# Patient Record
Sex: Female | Born: 1960 | Race: Black or African American | Hispanic: No | Smoking: Never smoker
Health system: Southern US, Community
[De-identification: ages and names within clinical notes are randomized; demographics above are authoritative.]

## PROBLEM LIST (undated history)

## (undated) ENCOUNTER — Inpatient Hospital Stay (HOSPITAL_COMMUNITY): Payer: BC Managed Care – PPO

## (undated) DIAGNOSIS — J329 Chronic sinusitis, unspecified: Secondary | ICD-10-CM

## (undated) DIAGNOSIS — G709 Myoneural disorder, unspecified: Secondary | ICD-10-CM

## (undated) DIAGNOSIS — M791 Myalgia, unspecified site: Secondary | ICD-10-CM

## (undated) DIAGNOSIS — G4700 Insomnia, unspecified: Secondary | ICD-10-CM

## (undated) DIAGNOSIS — F419 Anxiety disorder, unspecified: Secondary | ICD-10-CM

## (undated) DIAGNOSIS — J339 Nasal polyp, unspecified: Secondary | ICD-10-CM

## (undated) DIAGNOSIS — E559 Vitamin D deficiency, unspecified: Secondary | ICD-10-CM

## (undated) DIAGNOSIS — I10 Essential (primary) hypertension: Secondary | ICD-10-CM

## (undated) DIAGNOSIS — M797 Fibromyalgia: Secondary | ICD-10-CM

## (undated) DIAGNOSIS — R002 Palpitations: Secondary | ICD-10-CM

## (undated) DIAGNOSIS — E669 Obesity, unspecified: Secondary | ICD-10-CM

## (undated) DIAGNOSIS — M5030 Other cervical disc degeneration, unspecified cervical region: Secondary | ICD-10-CM

## (undated) DIAGNOSIS — F329 Major depressive disorder, single episode, unspecified: Secondary | ICD-10-CM

## (undated) DIAGNOSIS — E785 Hyperlipidemia, unspecified: Secondary | ICD-10-CM

## (undated) DIAGNOSIS — M25561 Pain in right knee: Secondary | ICD-10-CM

## (undated) DIAGNOSIS — E7800 Pure hypercholesterolemia, unspecified: Secondary | ICD-10-CM

## (undated) DIAGNOSIS — G8929 Other chronic pain: Secondary | ICD-10-CM

## (undated) DIAGNOSIS — F32A Depression, unspecified: Secondary | ICD-10-CM

## (undated) DIAGNOSIS — K219 Gastro-esophageal reflux disease without esophagitis: Secondary | ICD-10-CM

## (undated) HISTORY — DX: Myalgia, unspecified site: M79.10

## (undated) HISTORY — DX: Major depressive disorder, single episode, unspecified: F32.9

## (undated) HISTORY — PX: OTHER SURGICAL HISTORY: SHX169

## (undated) HISTORY — DX: Other chronic pain: G89.29

## (undated) HISTORY — DX: Palpitations: R00.2

## (undated) HISTORY — DX: Depression, unspecified: F32.A

## (undated) HISTORY — DX: Vitamin D deficiency, unspecified: E55.9

## (undated) HISTORY — DX: Pain in right knee: M25.561

## (undated) HISTORY — DX: Anxiety disorder, unspecified: F41.9

## (undated) HISTORY — PX: BREAST REDUCTION SURGERY: SHX8

## (undated) HISTORY — PX: OOPHORECTOMY: SHX86

---

## 1998-12-22 HISTORY — PX: REDUCTION MAMMAPLASTY: SUR839

## 2005-11-23 ENCOUNTER — Emergency Department: Payer: Self-pay | Admitting: Internal Medicine

## 2007-11-10 ENCOUNTER — Ambulatory Visit: Payer: Self-pay

## 2007-11-10 IMAGING — MG MAM DGTL SCREENING MAMMO W/CAD
1 series · 4 of 4 positions shown · non-contrast
Comparison: none

REASON FOR EXAM: Screening mammogram
COMMENTS:

[Series 2810: R CC · right · 4 of 6 slices shown]
[im 1/6]
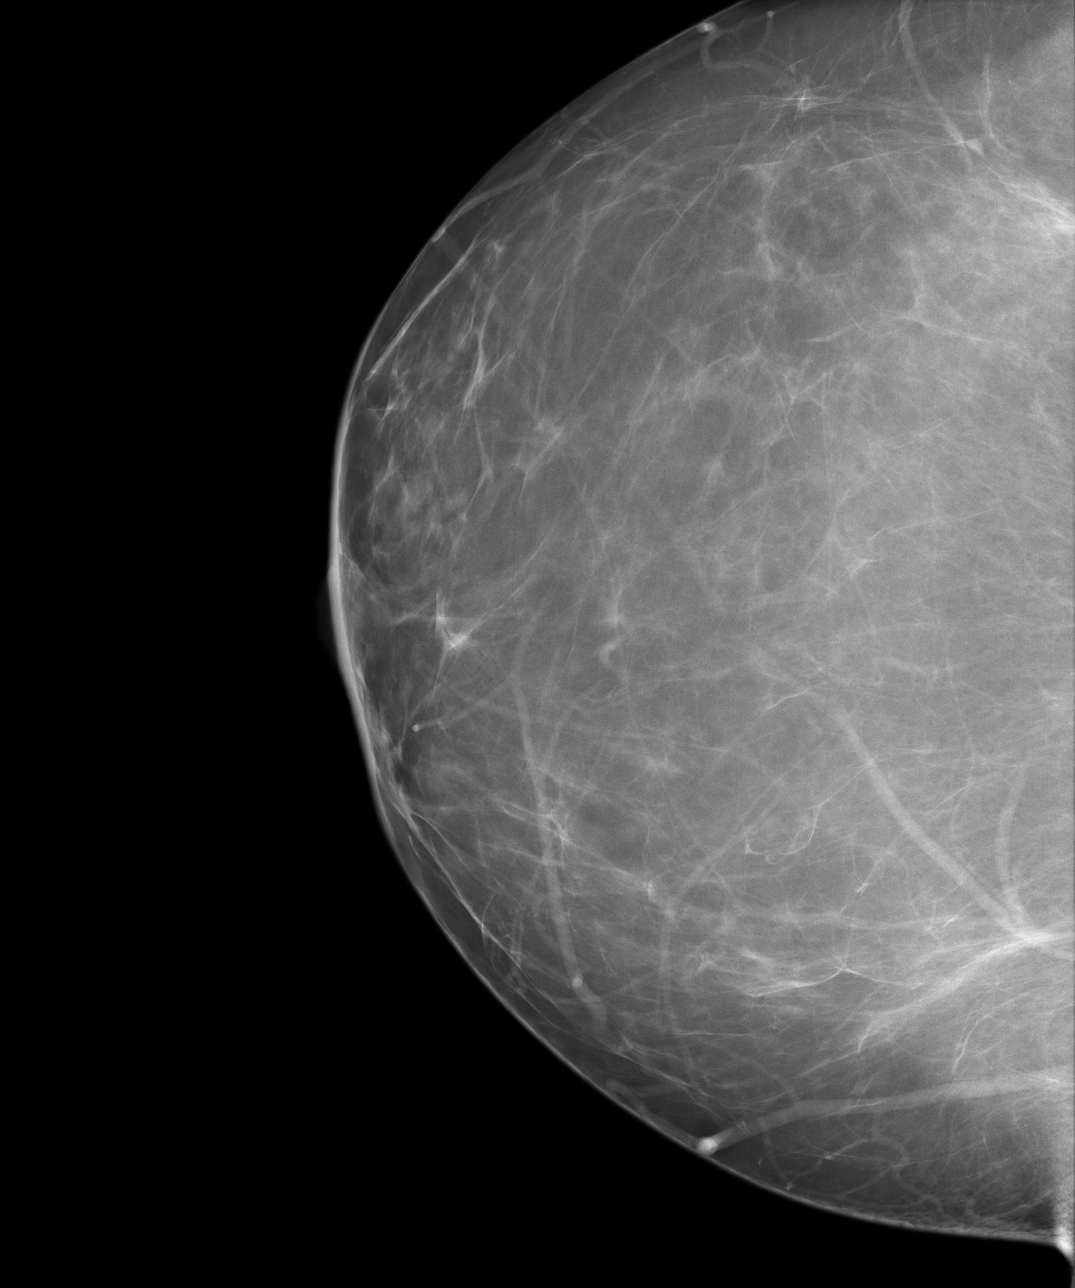
[im 2/6]
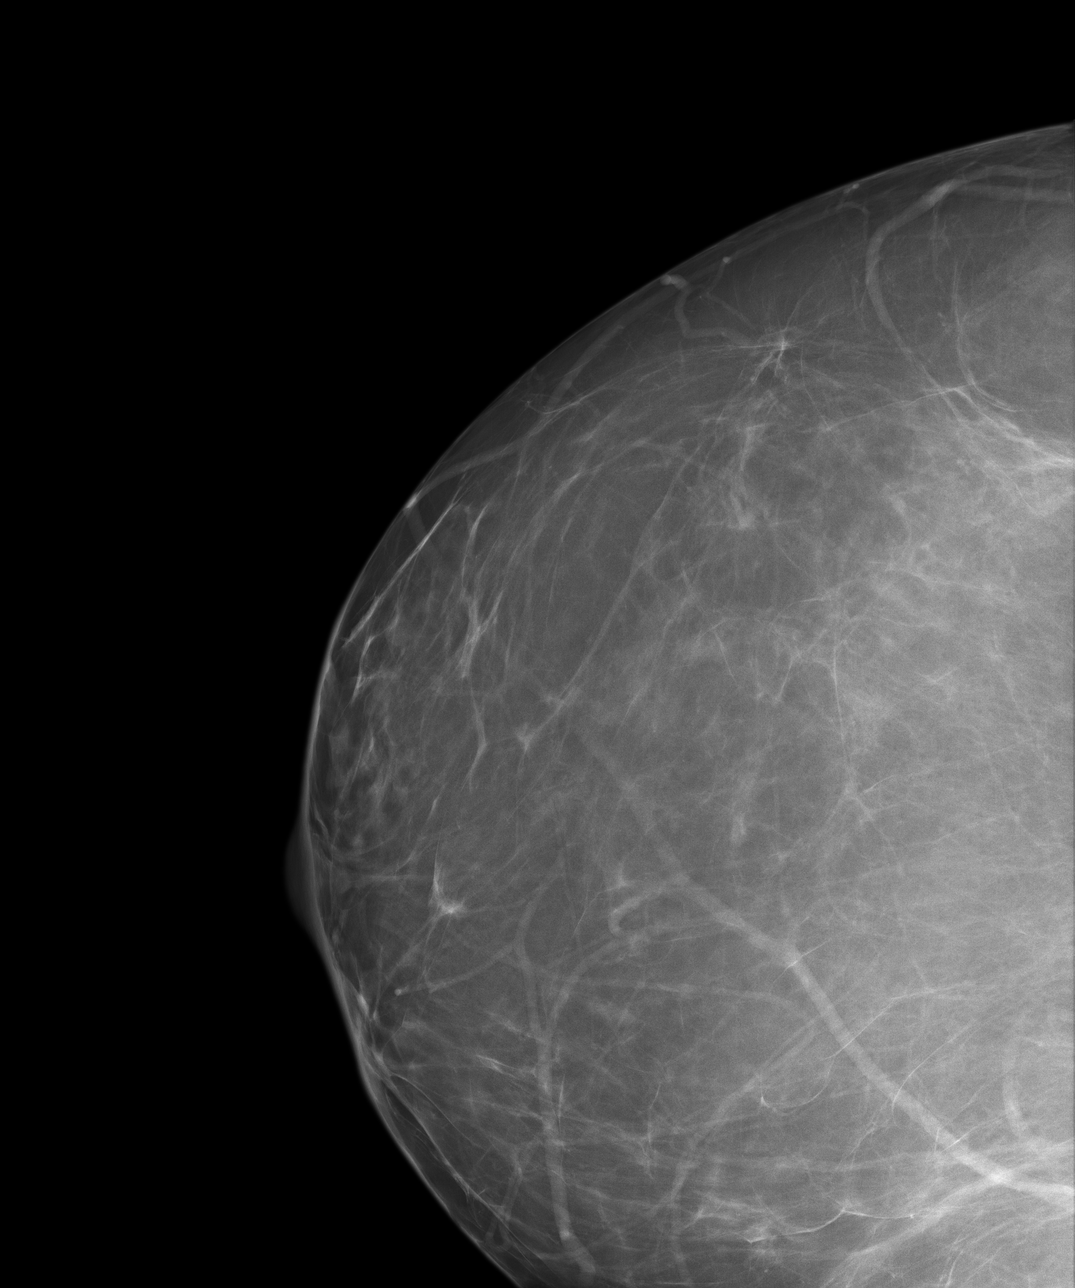
[im 4/6]
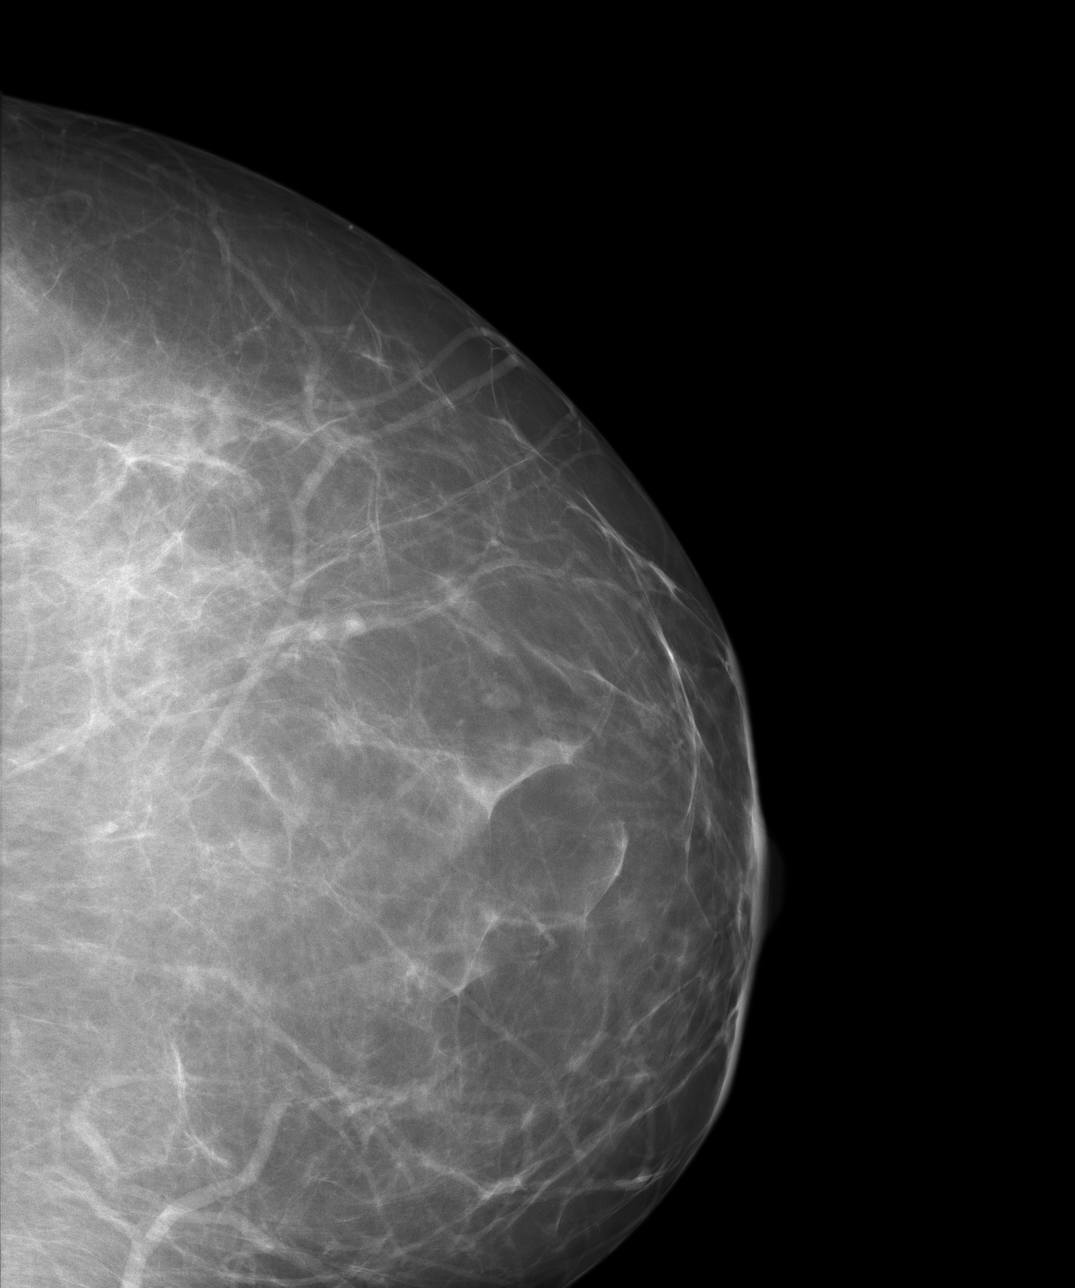
[im 6/6]
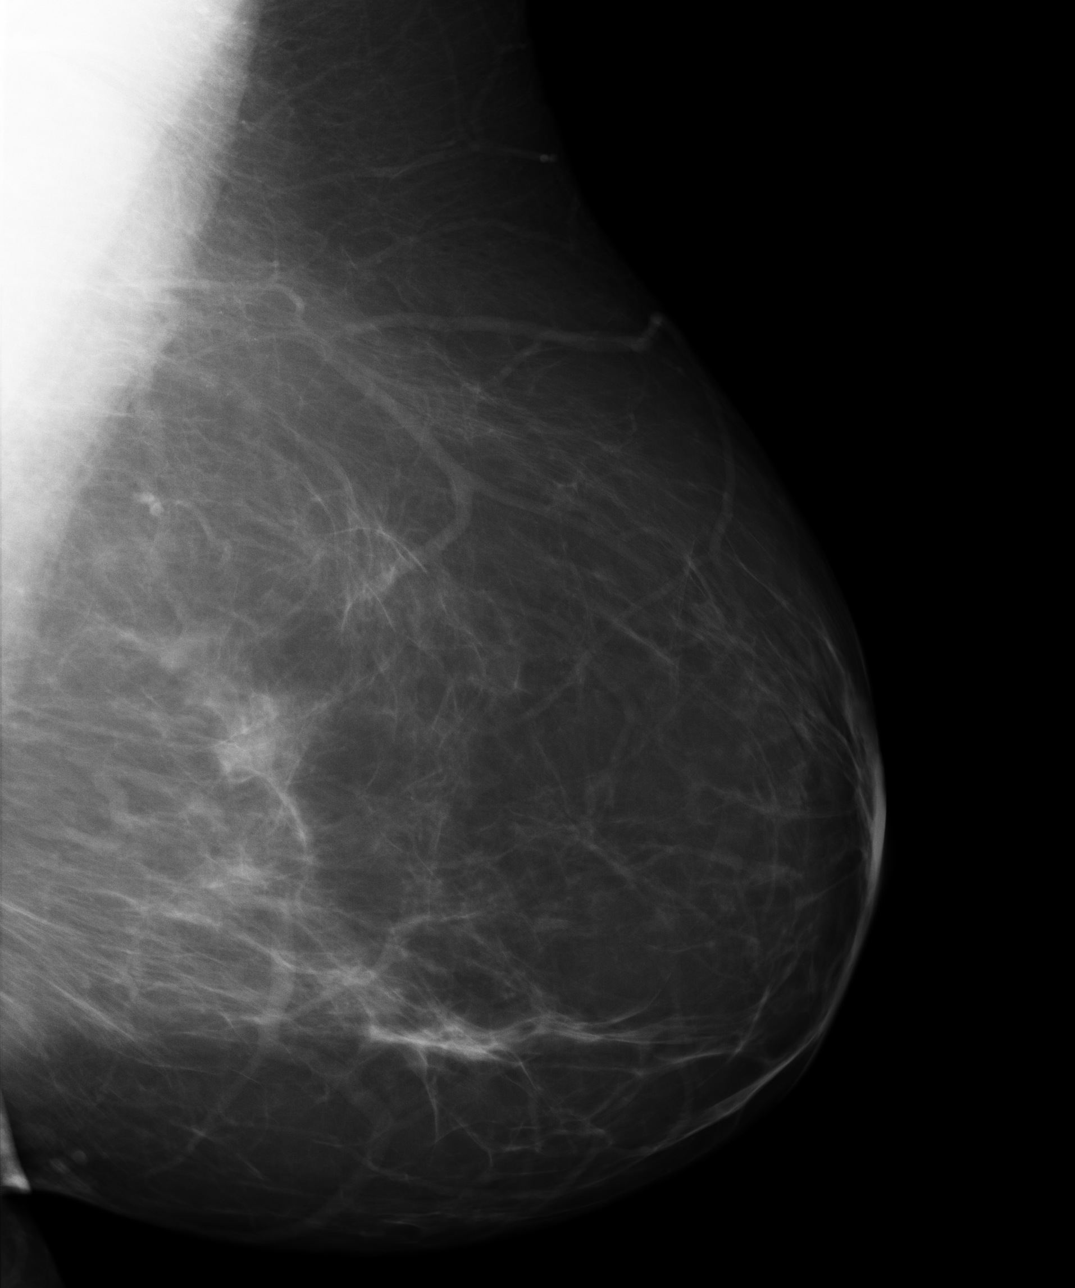

[4 of 4 positions shown; findings below may reference images not displayed]

PROCEDURE:     MAM - MAM DGTL SCREENING MAMMO W/CAD  - [DATE]  [DATE]

RESULT:     The breasts demonstrate a heterogeneous parenchymal pattern. An
area of asymmetric, spiculated density projects in the central portion of
the LEFT breast, appreciated primarily on the MLO view. Further evaluation
with magnification compression imaging is recommended. No further
radiographic evidence to suggest malignancy is appreciated.
IMPRESSION: BI-RADS: Category 0 - Needs Additional Imaging Evaluation

Thank you for this opportunity to contribute to the care of your patient.

A NEGATIVE MAMMOGRAM REPORT DOES NOT PRECLUDE BIOPSY OR OTHER EVALUATION OF
A CLINICALLY PALPABLE OR OTHERWISE SUSPICIOUS MASS OR LESION. BREAST CANCER
MAY NOT BE DETECTED BY MAMMOGRAPHY IN UP TO 10% OF CASES.

## 2007-11-24 ENCOUNTER — Ambulatory Visit: Payer: Self-pay

## 2008-01-11 ENCOUNTER — Emergency Department: Payer: Self-pay | Admitting: Emergency Medicine

## 2008-01-11 IMAGING — CT CT HEAD WITHOUT CONTRAST
2 series · 15 of 30 positions shown, 19 images · non-contrast
Comparison: none

REASON FOR EXAM: left temporal headache
COMMENTS:

PROCEDURE:     CT  - CT HEAD WITHOUT CONTRAST  - [DATE]  [DATE]
RESULT:     Comparison: Report of head CT on [DATE]. Images are not
currently available for direct comparison.
Procedure: CT examination of the head was performed without intravenous
contrast. Collimation is 5 mm.

[Series 2: without · axial · non-contrast · 0.43mm/px · z∈[+442,+562]mm · 13 of 30 slices shown, 17 images]
[im 3/30  brain]
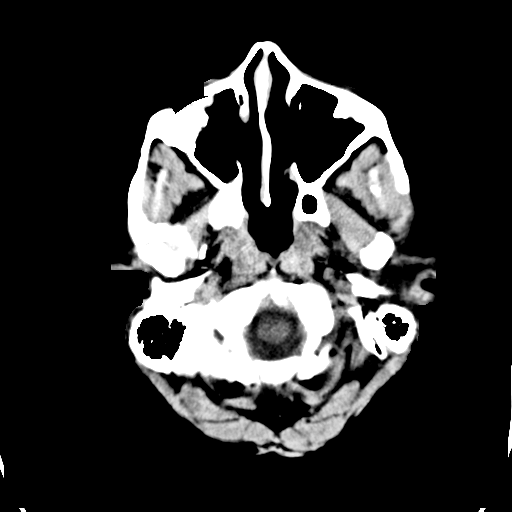
[im 3/30  bone]
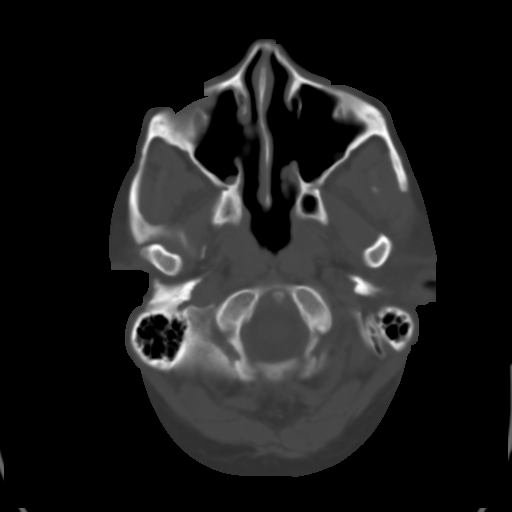
[im 5/30  brain]
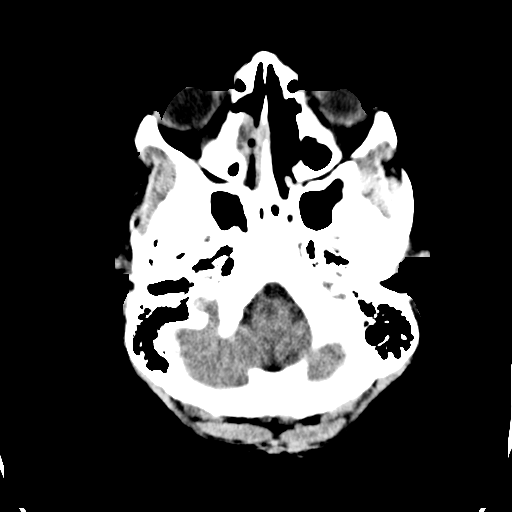
[im 7/30  brain]
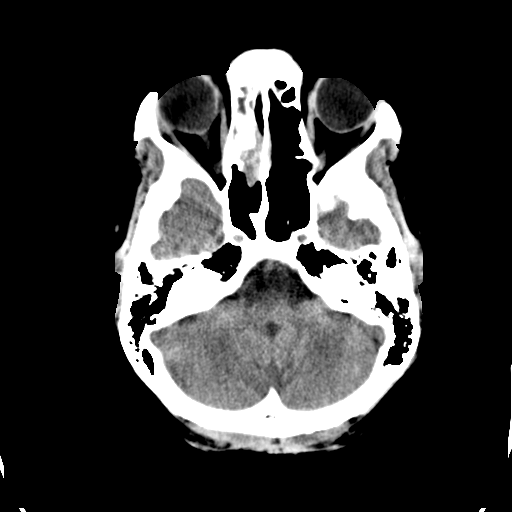
[im 9/30  brain]
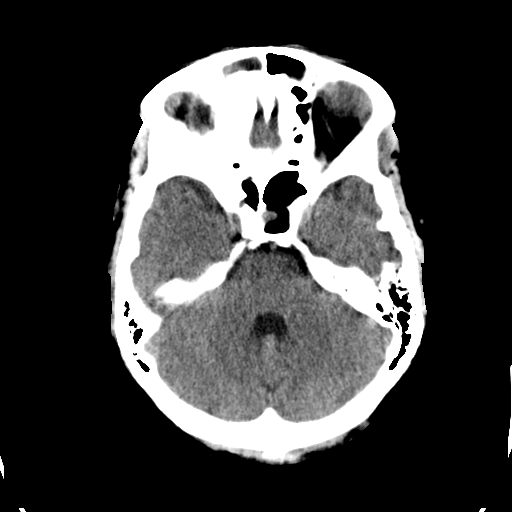
[im 11/30  brain]
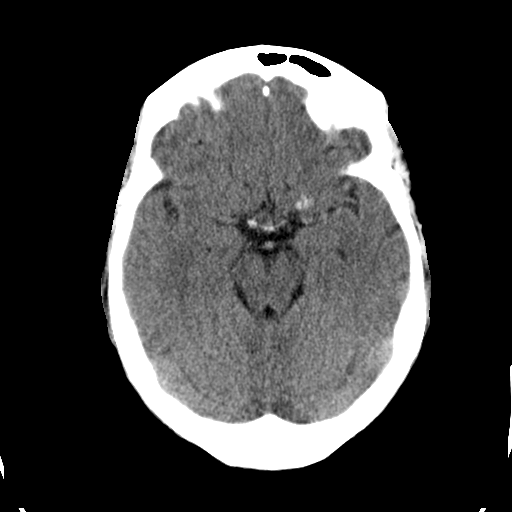
[im 11/30  bone]
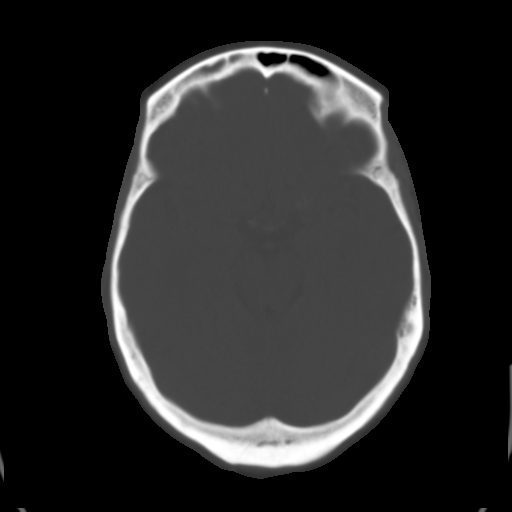
[im 13/30  brain]
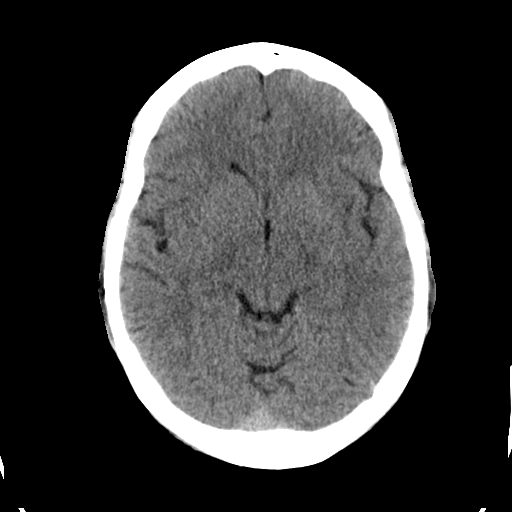
[im 15/30  brain]
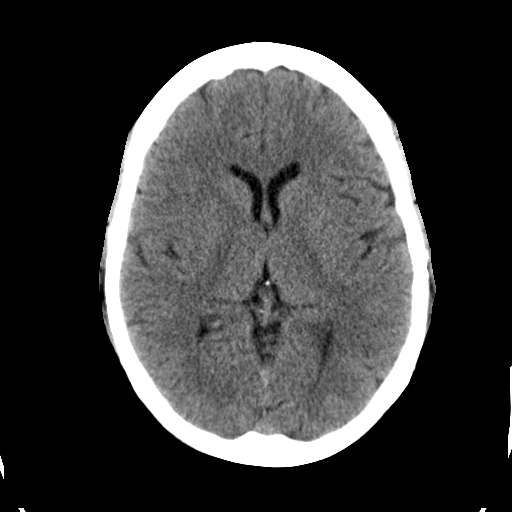
[im 17/30  brain]
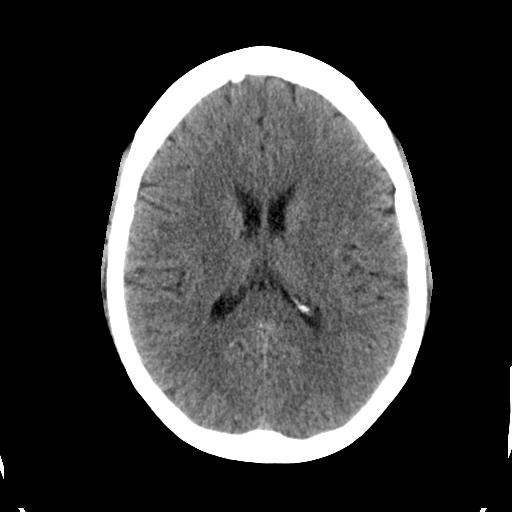
[im 19/30  brain]
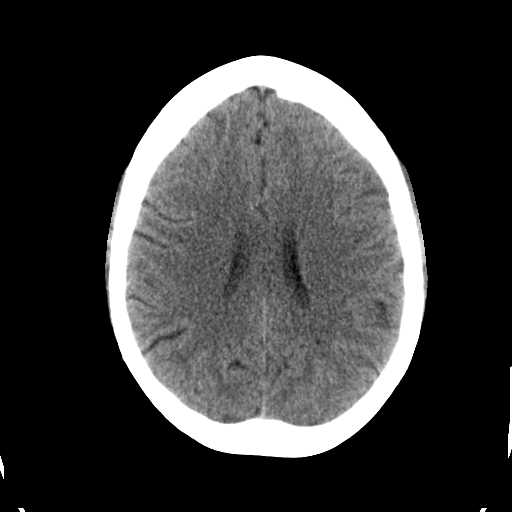
[im 19/30  bone]
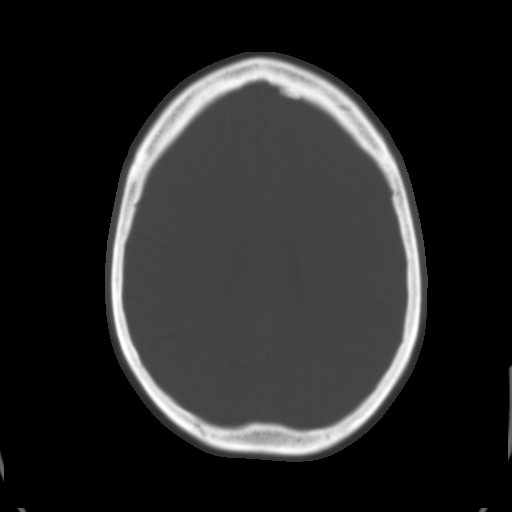
[im 21/30  brain]
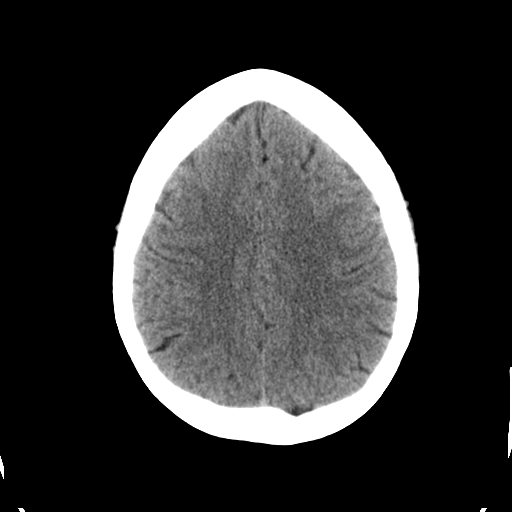
[im 23/30  brain]
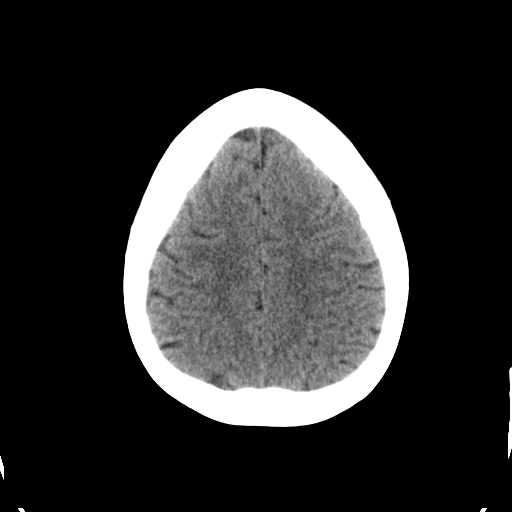
[im 25/30  brain]
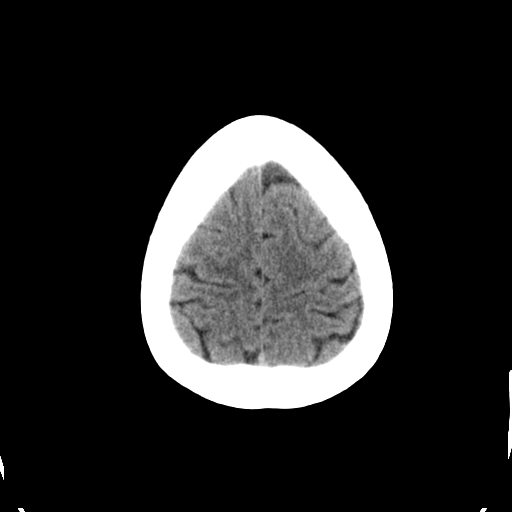
[im 27/30  brain]
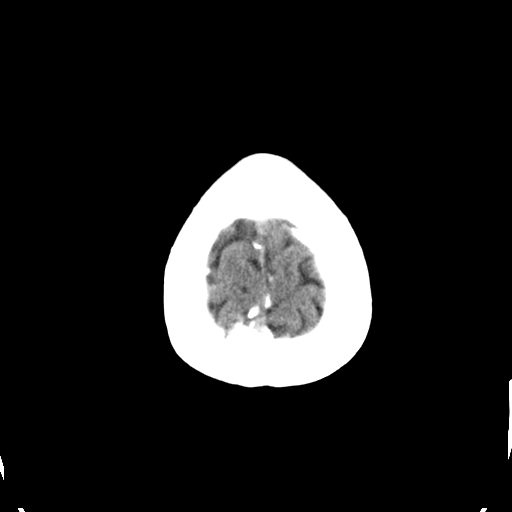
[im 27/30  bone]
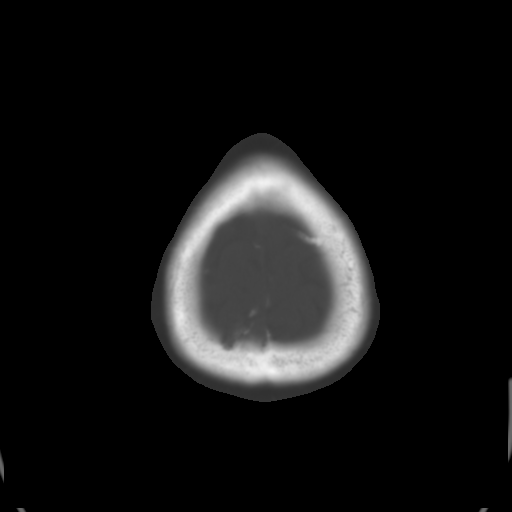

[Series 3: bone · axial · 0.43mm/px · z∈[+442,+462]mm · 2 of 30 slices shown]
[im 3/30  bone]
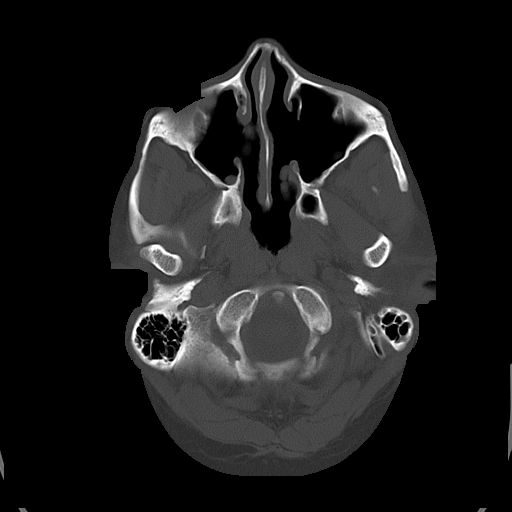
[im 7/30  bone]
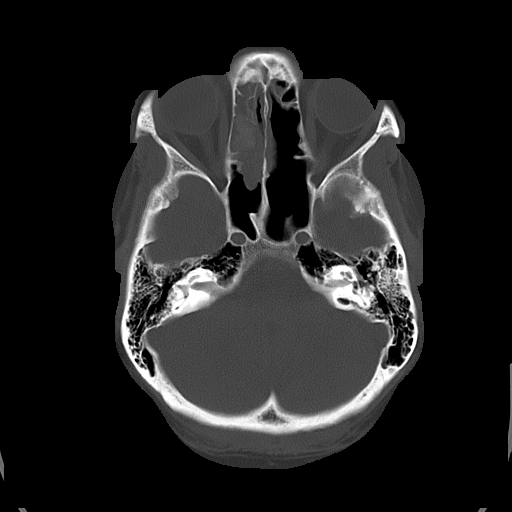

[15 of 30 positions shown; findings below may reference images not displayed]

FINDINGS: There is a 7 mm left frontal extra-axial calcification which can be related
to a calcified meningioma. No evidence of intracranial hemorrhage,
mass-effect, or ventricular dilatation. The gray and white matters are
differentiated. No displaced calvarial fracture is noted. There is total
opacification of the right frontal sinus as well as moderate partial
opacification of the right ethmoid sinus. Changes of bilateral
maxillectomies are noted.
IMPRESSION: 1. There is a 7 mm left frontal extra-axial calcification which can be
related to a calcified meningioma. This is similar to report of head CT on
[DATE].

[DATE]. There is total opacification of the right frontal sinus as well as
moderate partial opacification of the right ethmoid sinus. Correlate
clinically for any signs and symptoms of sinusitis. Changes of bilateral
maxillectomies are noted.

Preliminary report was faxed to the emergency room by the night radiologist
shortly after the study was performed.

## 2008-09-12 ENCOUNTER — Emergency Department: Payer: Self-pay | Admitting: Emergency Medicine

## 2009-08-03 ENCOUNTER — Emergency Department: Payer: Self-pay | Admitting: Emergency Medicine

## 2010-04-03 ENCOUNTER — Ambulatory Visit: Payer: Self-pay | Admitting: Anesthesiology

## 2010-12-22 ENCOUNTER — Emergency Department: Payer: Self-pay | Admitting: Emergency Medicine

## 2011-01-02 ENCOUNTER — Emergency Department: Payer: Self-pay | Admitting: Emergency Medicine

## 2011-07-09 ENCOUNTER — Ambulatory Visit: Payer: Self-pay | Admitting: Family Medicine

## 2011-07-09 IMAGING — US ABDOMEN ULTRASOUND LIMITED
1 series · 17 of 25 positions shown · non-contrast
Comparison: none

REASON FOR EXAM: STAT CR [id number] [L1] ext 233 RUQ pain eval gb
COMMENTS:

PROCEDURE:     US  - US ABDOMEN LIMITED SURVEY  - [DATE] [DATE]
RESULT:     Comparison: None.
TECHNIQUE: Multiple grayscale and color Doppler images obtained of the right
upper quadrant.

[Series 1: abdomen ultrasound limited · 17 of 59 slices shown]
[im 1/59]
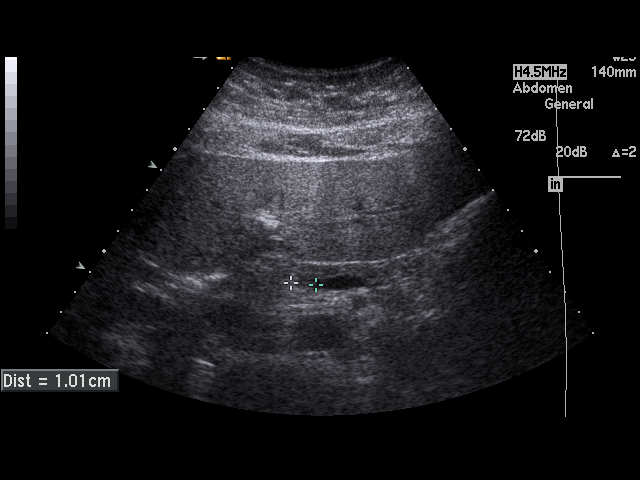
[im 5/59]
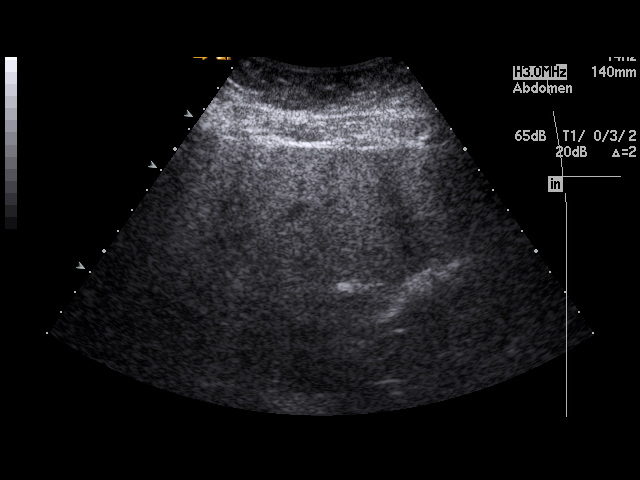
[im 8/59]
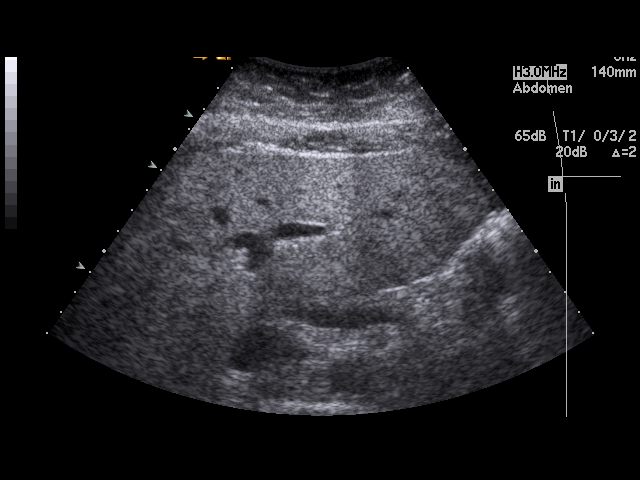
[im 13/59]
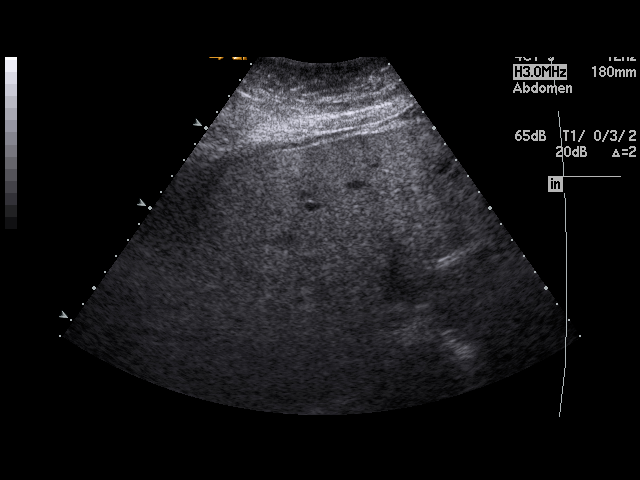
[im 15/59]
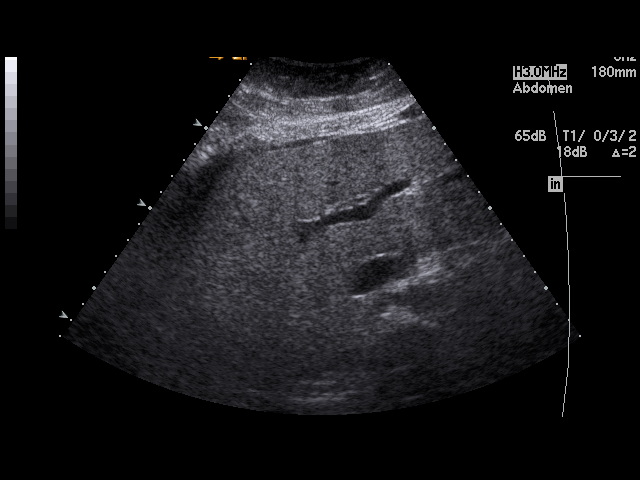
[im 20/59]
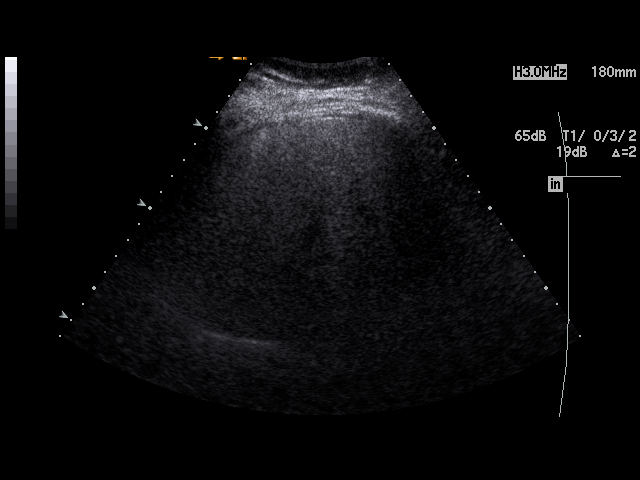
[im 22/59]
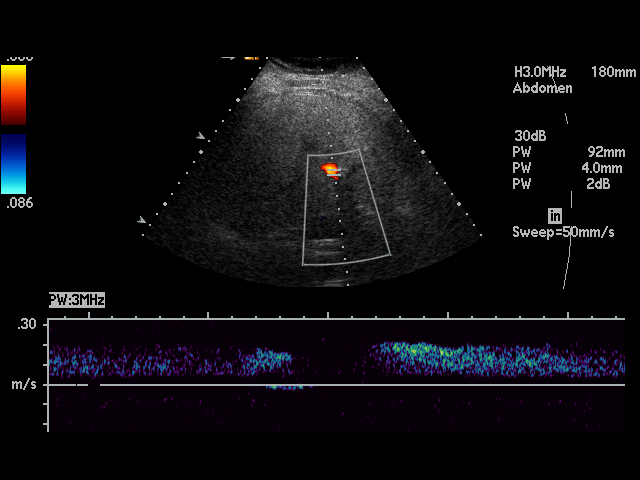
[im 27/59]
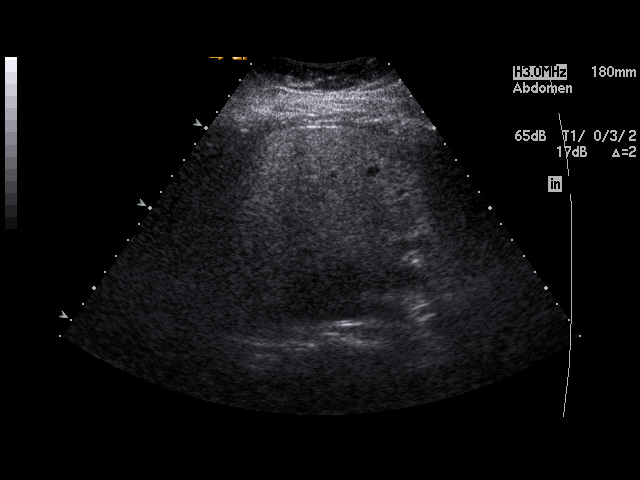
[im 30/59]
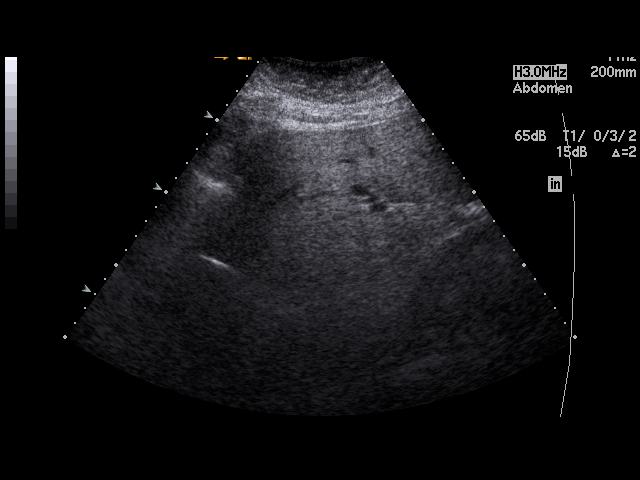
[im 32/59]
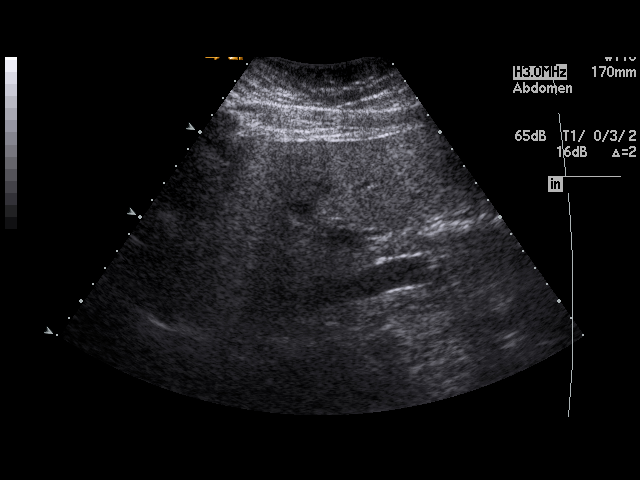
[im 37/59]
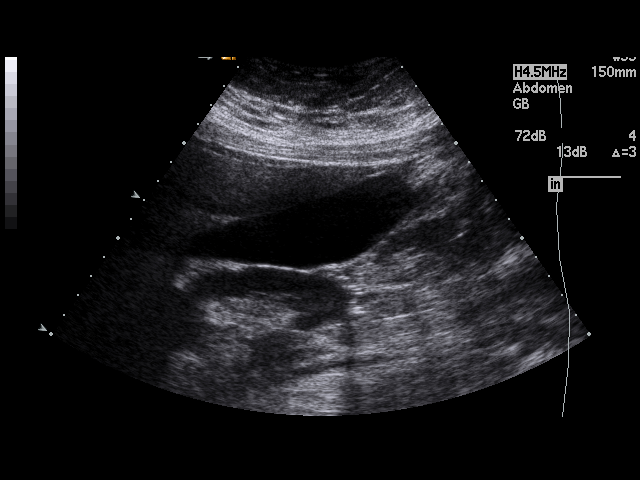
[im 39/59]
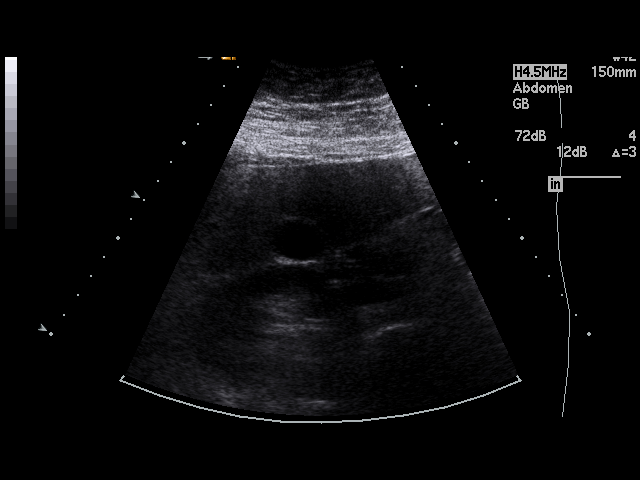
[im 44/59]
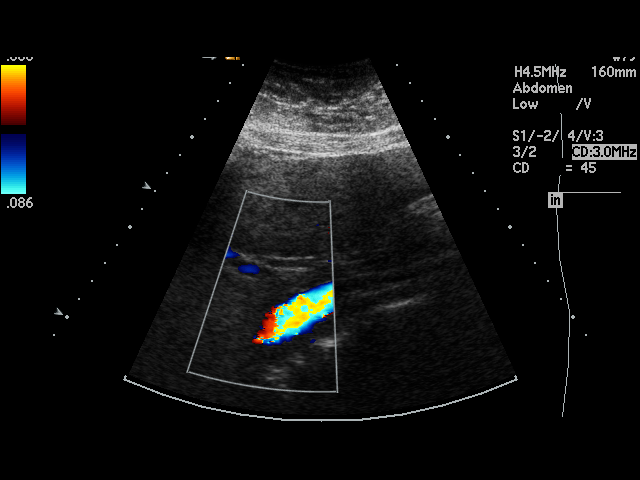
[im 46/59]
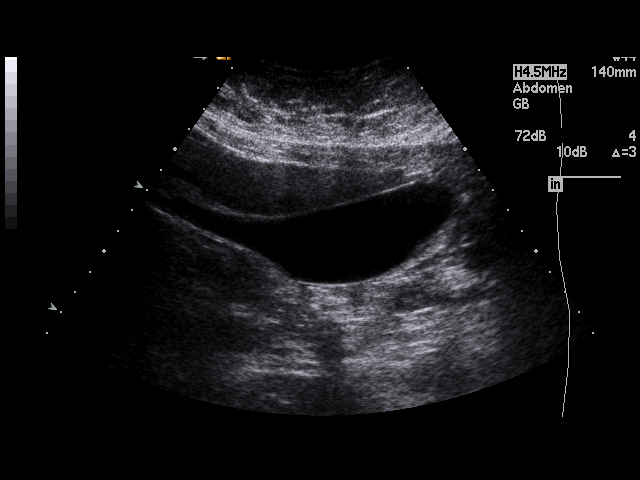
[im 51/59]
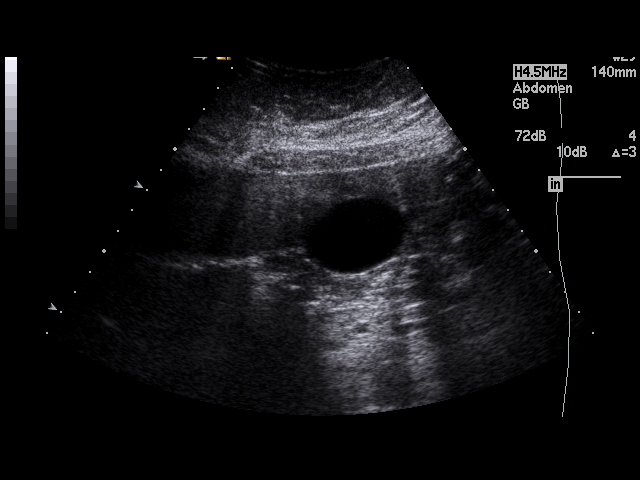
[im 54/59]
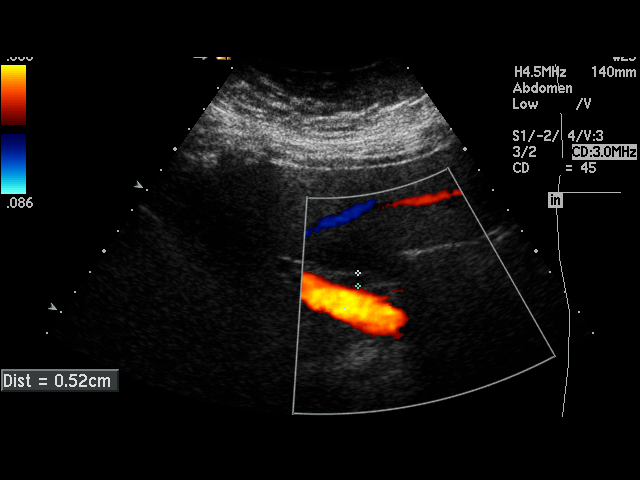
[im 59/59]
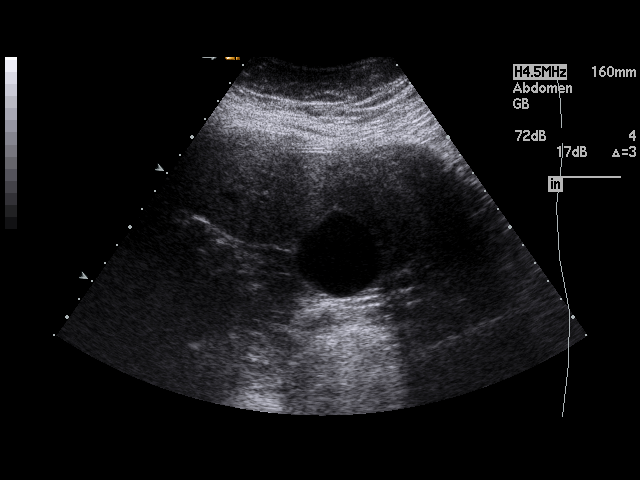

[17 of 25 positions shown; findings below may reference images not displayed]

FINDINGS: The liver is diffusely echogenic and dense, consistent hepatic steatosis.
The pancreas was mostly obscured by overlying bowel gas. The gallbladder is
normal. Sonographic sign was negative. The common bile duct measures 5 mm.
IMPRESSION: 1. Normal gallbladder.
2. Hepatic steatosis.
3. Limited visualization of the pancreas.

## 2011-12-13 ENCOUNTER — Emergency Department (HOSPITAL_COMMUNITY): Payer: BC Managed Care – PPO

## 2011-12-13 ENCOUNTER — Encounter: Payer: Self-pay | Admitting: *Deleted

## 2011-12-13 ENCOUNTER — Other Ambulatory Visit: Payer: Self-pay

## 2011-12-13 ENCOUNTER — Emergency Department (HOSPITAL_COMMUNITY)
Admission: EM | Admit: 2011-12-13 | Discharge: 2011-12-13 | Disposition: A | Discharge status: Home / Self Care | Payer: BC Managed Care – PPO | Attending: Emergency Medicine | Admitting: Emergency Medicine

## 2011-12-13 DIAGNOSIS — R002 Palpitations: Secondary | ICD-10-CM

## 2011-12-13 DIAGNOSIS — R000 Tachycardia, unspecified: Secondary | ICD-10-CM | POA: Insufficient documentation

## 2011-12-13 DIAGNOSIS — Z79899 Other long term (current) drug therapy: Secondary | ICD-10-CM | POA: Insufficient documentation

## 2011-12-13 DIAGNOSIS — R0989 Other specified symptoms and signs involving the circulatory and respiratory systems: Secondary | ICD-10-CM | POA: Insufficient documentation

## 2011-12-13 DIAGNOSIS — R0609 Other forms of dyspnea: Secondary | ICD-10-CM | POA: Insufficient documentation

## 2011-12-13 DIAGNOSIS — R0602 Shortness of breath: Secondary | ICD-10-CM | POA: Insufficient documentation

## 2011-12-13 HISTORY — DX: Essential (primary) hypertension: I10

## 2011-12-13 HISTORY — DX: Pure hypercholesterolemia, unspecified: E78.00

## 2011-12-13 LAB — BASIC METABOLIC PANEL WITH GFR
BUN: 17 mg/dL (ref 6–23)
CO2: 26 meq/L (ref 19–32)
Calcium: 9.4 mg/dL (ref 8.4–10.5)
Chloride: 102 meq/L (ref 96–112)
Creatinine, Ser: 0.7 mg/dL (ref 0.50–1.10)
GFR calc Af Amer: >90 mL/min (ref >90)
GFR calc non Af Amer: >90 mL/min (ref >90)
Glucose, Bld: 151 mg/dL — ABNORMAL HIGH (ref 70–99)
Potassium: 3.8 meq/L (ref 3.5–5.1)
Sodium: 140 meq/L (ref 135–145)

## 2011-12-13 LAB — CBC
HCT: 41.3 % (ref 36.0–46.0)
Hemoglobin: 13.5 g/dL (ref 12.0–15.0)
MCH: 26.7 pg (ref 26.0–34.0)
MCHC: 32.7 g/dL (ref 30.0–36.0)
MCV: 81.8 fL (ref 78.0–100.0)
Platelets: 233 K/uL (ref 150–400)
RBC: 5.05 MIL/uL (ref 3.87–5.11)
RDW: 14 % (ref 11.5–15.5)
WBC: 9.4 K/uL (ref 4.0–10.5)

## 2011-12-13 LAB — CARDIAC PANEL(CRET KIN+CKTOT+MB+TROPI)
Relative Index: 0.9 (ref 0.0–2.5)
Troponin I: <0.3 ng/mL (ref <0.30)

## 2011-12-13 NOTE — ED Notes (Signed)
Pt c/o intermittent episodes of "rapid heart rate" assoc. W/sob and mid-sternum chest discomfort "for a while" w/increase concern last night, pt denies back/abd pain, N/V, and diaphoresis. NAD, will continue to monitor

## 2011-12-13 NOTE — ED Notes (Signed)
Gave new ECG to Dr. Jeraldine Loots after I performed. 8:21 am JG

## 2011-12-13 NOTE — ED Notes (Signed)
Pt c/o "heart racing" associated w/some sob starting last night

## 2011-12-13 NOTE — ED Provider Notes (Signed)
History     CSN: [id number]  Arrival date & time 12/13/11  4098   First MD Initiated Contact with Patient 12/13/11 0805      Chief Complaint  Patient presents with  . Tachycardia  . Shortness of Breath    (Consider location/radiation/quality/duration/timing/severity/associated sxs/prior treatment) HPI The patient presents following an episode of palpitations and mild dyspnea last night. She notes her symptoms began insidiously approximately 12 hours ago. Although she was symptomatic, she did go out socially. She notes that she was otherwise well, was able to act normally, but her symptoms persisted until going to sleep, approximately 8 hours ago. She woke this morning asymptomatic, but presents with concerns over what may have occurred yesterday. She notes that she has been taking melatonin, Benadryl, Z-Quill.  She notes no prior illness, no other new medications, no smoking, no recent travel, no history of cardiac disease, no history of PE/DVT. She denies significant pain throughout the episode, notes mild discomfort with coughing. No fever, no chills, no syncope, no extremity edema History reviewed. No pertinent past medical history.  History reviewed. No pertinent past surgical history.  No family history on file.  History  Substance Use Topics  . Smoking status: Not on file  . Smokeless tobacco: Not on file  . Alcohol Use: Not on file    OB History    Grav Para Term Preterm Abortions TAB SAB Ect Mult Living                  Review of Systems  Constitutional:       HPI  HENT:       HPI otherwise negative  Eyes: Negative.   Respiratory:       HPI, otherwise negative  Cardiovascular:       HPI, otherwise nmegative  Gastrointestinal: Negative for vomiting.  Genitourinary:       HPI, otherwise negative  Musculoskeletal:       HPI, otherwise negative  Skin: Negative.   Neurological: Negative for syncope.    Allergies  Compazine  Home Medications   Current  Outpatient Rx  Name Route Sig Dispense Refill  . VITAMIN D 1000 UNITS PO TABS Oral Take 1,000 Units by mouth 2 (two) times daily.      Marland Kitchen DIPHENHYDRAMINE HCL 25 MG PO TABS Oral Take 50 mg by mouth every 6 (six) hours as needed. For sleeplessness and anxiety     . GLIMEPIRIDE 4 MG PO TABS Oral Take 4 mg by mouth 2 (two) times daily.      Marland Kitchen LISINOPRIL 20 MG PO TABS Oral Take 20 mg by mouth daily.      Marland Kitchen MELATONIN 3 MG PO TABS Oral Take 3-4 tablets by mouth at bedtime.      Marland Kitchen METFORMIN HCL 500 MG PO TABS Oral Take 500 mg by mouth 2 (two) times daily with a meal.      . MILK THISTLE 175 MG PO TABS Oral Take 175 mg by mouth daily. For cancer prevention     . ADULT MULTIVITAMIN W/MINERALS CH Oral Take 1 tablet by mouth daily.      Marland Kitchen PRAVASTATIN SODIUM PO Oral Take 1 tablet by mouth daily.      Marland Kitchen ZOLOFT PO Oral Take 0.5 tablets by mouth daily.        BP 148/88  Pulse 82  Temp(Src) 98.5 F (36.9 C) (Oral)  Resp 20  SpO2 98%  Physical Exam  Nursing note and vitals reviewed. Constitutional: She  is oriented to person, place, and time. She appears well-developed and well-nourished. No distress.  HENT:  Head: Normocephalic and atraumatic.  Eyes: Conjunctivae and EOM are normal.  Cardiovascular: Normal rate and regular rhythm.   Pulmonary/Chest: Effort normal and breath sounds normal. No stridor. No respiratory distress.  Abdominal: She exhibits no distension.  Musculoskeletal: She exhibits no edema.  Neurological: She is alert and oriented to person, place, and time. No cranial nerve deficit.  Skin: Skin is warm and dry.  Psychiatric: She has a normal mood and affect.    ED Course  Procedures (including critical care time)   Labs Reviewed  BASIC METABOLIC PANEL  CBC  CARDIAC PANEL(CRET KIN+CKTOT+MB+TROPI)   No results found.   No diagnosis found.  Pulse ox 99% room air, normal Cardiac monitor 79 sinus rhythm him a normal   Date: 12/13/2011  Rate: 82  Rhythm: normal sinus  rhythm  QRS Axis: normal  Intervals: normal  ST/T Wave abnormalities: normal  Conduction Disutrbances:none  Narrative Interpretation:   Old EKG Reviewed: none available  NORMAL ECG   MDM  This 50 year old female presents one day after an episode of palpitations. On exam the patient is asymptomatic, notes no symptoms for the past 8 hours, minimally. The patient discussion of palpitations is consistent with PAC/PVC, though there is some concern for loculated abnormalities, and minimal concern for ischemic cardiac disease. The patient's benign physical exam, unremarkable vital signs, and a laboratory evaluation that did not demonstrate acute findings beyond an elevated CK are all reassuring. The patient's chest x-ray is essentially normal though it is formally read as possible opacification in the left lower lobe. Given the absence of fever, cough, leukocytosis, hypoxia, tachypnea pneumonia is not clinically consistent. The patient will be discharged with instructions to follow up with her primary care physician, with consideration of outpatient Holter monitoring. She was also provided explicit return precautions.        Gerhard Munch, MD 12/13/11 [phone]

## 2011-12-21 ENCOUNTER — Encounter (HOSPITAL_COMMUNITY): Payer: Self-pay

## 2011-12-21 ENCOUNTER — Emergency Department (HOSPITAL_COMMUNITY)
Admission: EM | Admit: 2011-12-21 | Discharge: 2011-12-21 | Disposition: A | Discharge status: Home / Self Care | Payer: BC Managed Care – PPO | Attending: Emergency Medicine | Admitting: Emergency Medicine

## 2011-12-21 DIAGNOSIS — E119 Type 2 diabetes mellitus without complications: Secondary | ICD-10-CM | POA: Insufficient documentation

## 2011-12-21 DIAGNOSIS — I10 Essential (primary) hypertension: Secondary | ICD-10-CM | POA: Insufficient documentation

## 2011-12-21 DIAGNOSIS — E7800 Pure hypercholesterolemia, unspecified: Secondary | ICD-10-CM | POA: Insufficient documentation

## 2011-12-21 DIAGNOSIS — G479 Sleep disorder, unspecified: Secondary | ICD-10-CM

## 2011-12-21 DIAGNOSIS — R5383 Other fatigue: Secondary | ICD-10-CM

## 2011-12-21 DIAGNOSIS — R5381 Other malaise: Secondary | ICD-10-CM | POA: Insufficient documentation

## 2011-12-21 MED ORDER — ZOLPIDEM TARTRATE 5 MG PO TABS: 5.0000 mg | ORAL_TABLET | Freq: Every evening | ORAL | Status: DC | PRN

## 2011-12-21 NOTE — ED Provider Notes (Signed)
History     CSN: [id number]  Arrival date & time 12/21/11  0908   First MD Initiated Contact with Patient 12/21/11 0912      Chief Complaint  Patient presents with  . Fatigue    (Consider location/radiation/quality/duration/timing/severity/associated sxs/prior treatment) HPI Patient is a 50 yo female who presents today complaining of fatigue and difficulties with sleep.  She was here several days ago with evaluation for palpitations.  These have improved and she is following up with a regular doctor.  Patient denies any other symptoms.  She has no chest pain, lightheadedness, or shortness of breath.  She also denies any rectal bleeding or black,tarry stools.  There are no other associated factors. Past Medical History  Diagnosis Date  . Diabetes mellitus   . Hypertension   . High cholesterol     Past Surgical History  Procedure Date  . Breast reduction surgery   . Nasal endoscopic   . Breast surgery   . Removal of ovary     Family History  Problem Relation Age of Onset  . Diabetes Mother   . Cancer Mother     History  Substance Use Topics  . Smoking status: Never Smoker   . Smokeless tobacco: Not on file  . Alcohol Use: No    OB History    Grav Para Term Preterm Abortions TAB SAB Ect Mult Living                  Review of Systems  Constitutional: Positive for fatigue.  HENT: Negative.   Eyes: Negative.   Respiratory: Negative.   Cardiovascular: Negative.   Genitourinary: Negative.   Musculoskeletal: Negative.   Neurological: Negative.   Hematological: Negative.   Psychiatric/Behavioral: Positive for sleep disturbance.    Allergies  Compazine  Home Medications   Current Outpatient Rx  Name Route Sig Dispense Refill  . ASPIRIN EC 325 MG PO TBEC Oral Take 162.5 mg by mouth daily.      Marland Kitchen VITAMIN D 1000 UNITS PO TABS Oral Take 1,000 Units by mouth 2 (two) times daily.      Marland Kitchen DIPHENHYDRAMINE HCL 25 MG PO TABS Oral Take 50 mg by mouth every 6 (six)  hours as needed. For sleeplessness and anxiety     . GLIMEPIRIDE 4 MG PO TABS Oral Take 4 mg by mouth 2 (two) times daily.      Marland Kitchen LISINOPRIL 20 MG PO TABS Oral Take 20 mg by mouth daily.      Marland Kitchen MELATONIN 3 MG PO TABS Oral Take 3-4 tablets by mouth at bedtime.      Marland Kitchen METFORMIN HCL ER 500 MG PO TB24 Oral Take 500 mg by mouth 2 (two) times daily.      Marland Kitchen MILK THISTLE 175 MG PO TABS Oral Take 175 mg by mouth daily. For cancer prevention     . ADULT MULTIVITAMIN W/MINERALS CH Oral Take 1 tablet by mouth daily.      Marland Kitchen PRAVASTATIN SODIUM 20 MG PO TABS Oral Take 20 mg by mouth daily.      . SERTRALINE HCL 50 MG PO TABS Oral Take 25 mg by mouth daily.      Marland Kitchen ZOLPIDEM TARTRATE 5 MG PO TABS Oral Take 1 tablet (5 mg total) by mouth at bedtime as needed for sleep. 10 tablet 0    BP 149/91  Pulse 79  Temp(Src) 97.2 F (36.2 C) (Oral)  Resp 18  SpO2 99%  Physical Exam  Nursing note  and vitals reviewed. Constitutional: She is oriented to person, place, and time. She appears well-developed and well-nourished. No distress.  HENT:  Head: Normocephalic and atraumatic.  Eyes: Conjunctivae and EOM are normal. Pupils are equal, round, and reactive to light.  Neck: Normal range of motion.  Cardiovascular: Normal rate, regular rhythm, normal heart sounds and intact distal pulses.  Exam reveals no gallop and no friction rub.   No murmur heard. Pulmonary/Chest: Effort normal and breath sounds normal. No respiratory distress. She has no wheezes. She has no rales.  Abdominal: Soft. Bowel sounds are normal. She exhibits no distension. There is no tenderness. There is no rebound and no guarding.  Musculoskeletal: Normal range of motion. She exhibits no edema.  Neurological: She is alert and oriented to person, place, and time. No cranial nerve deficit. She exhibits normal muscle tone. Coordination normal.  Skin: Skin is warm and dry. No rash noted.  Psychiatric:       Patient admits she has had trouble with  anxiety and depression and that her sleep disturbance may be related to this.  She has been sleeping a lot during the day and her problems come at night.  She does not have any signs of mania and she denies SI    ED Course  Procedures (including critical care time)  Labs Reviewed - No data to display No results found.   1. Fatigue   2. Sleep difficulties       MDM  Patient had no concerning physical symptoms today and was not suicidal.  We discussed sleep hygiene and that her multiple visits may stem from depression.  She admitted that she has struggled with this in the past.  She actually was prescribed zoloft but only took one pill.  We discussed that this medication takes at least 4 weeks of consistent therapy to work.  I was comfortable prescribing the patient 10 tabs of ambien.  She is going to start her zoloft and required no additional work-up.  She will follow-up with her regular doctor.         Cyndra Numbers, MD 12/21/11 [phone]

## 2011-12-21 NOTE — ED Notes (Signed)
Pt in NAD at this time. Pt thanked staff for good treatment. Pt given instructions on zolpidem prescription. Pt ambulated with a quick steady gait.

## 2011-12-21 NOTE — ED Notes (Signed)
Pt states that she has not been sleeping well and has been feeling generally tired for some time now. Pt unable to give time estimate, will only say "just a while now." Pt states she was here for heart palpitations and a cough last week and is still having cough.

## 2012-01-02 ENCOUNTER — Ambulatory Visit: Payer: BC Managed Care – PPO | Admitting: Cardiovascular Disease

## 2012-01-05 ENCOUNTER — Emergency Department: Payer: Self-pay | Admitting: Emergency Medicine

## 2012-01-05 LAB — COMPREHENSIVE METABOLIC PANEL
Albumin: 3.2 g/dL — ABNORMAL LOW (ref 3.4–5.0)
Anion Gap: 7 (ref 7–16)
BUN: 13 mg/dL (ref 7–18)
Bilirubin,Total: 0.2 mg/dL (ref 0.2–1.0)
Calcium, Total: 8.9 mg/dL (ref 8.5–10.1)
Glucose: 116 mg/dL — ABNORMAL HIGH (ref 65–99)
Osmolality: 290 (ref 275–301)
SGOT(AST): 18 U/L (ref 15–37)
Total Protein: 7.4 g/dL (ref 6.4–8.2)

## 2012-01-05 LAB — CBC WITH DIFFERENTIAL/PLATELET
Eosinophil %: 4.1 %
HGB: 12.2 g/dL (ref 12.0–16.0)
Lymphocyte %: 37.2 %
MCHC: 32.5 g/dL (ref 32.0–36.0)
Monocyte %: 6.8 %
Neutrophil %: 51.4 %
RBC: 4.59 10*6/uL (ref 3.80–5.20)
WBC: 7.6 10*3/uL (ref 3.6–11.0)

## 2012-01-21 ENCOUNTER — Ambulatory Visit: Payer: BC Managed Care – PPO | Admitting: Cardiovascular Disease

## 2012-05-31 ENCOUNTER — Ambulatory Visit: Payer: BC Managed Care – PPO | Admitting: Cardiovascular Disease

## 2012-07-16 ENCOUNTER — Ambulatory Visit: Payer: BC Managed Care – PPO | Admitting: Cardiovascular Disease

## 2012-07-20 ENCOUNTER — Emergency Department: Payer: Self-pay | Admitting: Emergency Medicine

## 2012-07-20 LAB — CBC WITH DIFFERENTIAL/PLATELET
Basophil %: 0.9 %
Eosinophil %: 4.5 %
Lymphocyte #: 2.1 10*3/uL (ref 1.0–3.6)
MCH: 26.8 pg (ref 26.0–34.0)
MCHC: 33.3 g/dL (ref 32.0–36.0)
MCV: 80 fL (ref 80–100)
Monocyte #: 0.6 x10 3/mm (ref 0.2–0.9)
Neutrophil #: 4.7 10*3/uL (ref 1.4–6.5)

## 2012-07-20 LAB — COMPREHENSIVE METABOLIC PANEL
Alkaline Phosphatase: 99 U/L (ref 50–136)
Calcium, Total: 8.9 mg/dL (ref 8.5–10.1)
Chloride: 103 mmol/L (ref 98–107)
Co2: 31 mmol/L (ref 21–32)
EGFR (African American): >60
EGFR (Non-African Amer.): >60
Osmolality: 286 (ref 275–301)
SGPT (ALT): 38 U/L

## 2012-07-20 LAB — TROPONIN I: Troponin-I: <0.02 ng/mL

## 2012-08-06 ENCOUNTER — Ambulatory Visit: Payer: BC Managed Care – PPO | Admitting: Cardiovascular Disease

## 2013-08-19 ENCOUNTER — Ambulatory Visit: Payer: Self-pay | Admitting: Family Medicine

## 2013-08-19 LAB — HM MAMMOGRAPHY: HM MAMMO: NORMAL

## 2014-03-02 LAB — LIPID PANEL
Cholesterol: 243 mg/dL — AB (ref 0–200)
HDL: 66 mg/dL (ref 35–70)
LDL Cholesterol: 141 mg/dL
Triglycerides: 180 mg/dL — AB (ref 40–160)

## 2014-07-11 ENCOUNTER — Emergency Department: Payer: Self-pay | Admitting: Emergency Medicine

## 2014-07-11 LAB — CBC
HCT: 39.3 % (ref 35.0–47.0)
HGB: 12.7 g/dL (ref 12.0–16.0)
MCH: 26.6 pg (ref 26.0–34.0)
MCHC: 32.4 g/dL (ref 32.0–36.0)
MCV: 82 fL (ref 80–100)
Platelet: 226 10*3/uL (ref 150–440)
RBC: 4.79 10*6/uL (ref 3.80–5.20)
RDW: 13.8 % (ref 11.5–14.5)
WBC: 7.6 10*3/uL (ref 3.6–11.0)

## 2014-07-11 LAB — BASIC METABOLIC PANEL
ANION GAP: 10 (ref 7–16)
BUN: 19 mg/dL — AB (ref 7–18)
CALCIUM: 9 mg/dL (ref 8.5–10.1)
CHLORIDE: 103 mmol/L (ref 98–107)
CO2: 25 mmol/L (ref 21–32)
Creatinine: 0.9 mg/dL (ref 0.60–1.30)
Glucose: 225 mg/dL — ABNORMAL HIGH (ref 65–99)
Osmolality: 285 (ref 275–301)
POTASSIUM: 3.8 mmol/L (ref 3.5–5.1)
SODIUM: 138 mmol/L (ref 136–145)

## 2014-07-11 LAB — TROPONIN I: Troponin-I: <0.02 ng/mL

## 2014-08-01 ENCOUNTER — Emergency Department: Payer: Self-pay | Admitting: Student

## 2014-08-01 LAB — TROPONIN I: Troponin-I: <0.02 ng/mL

## 2014-08-01 LAB — CBC
HCT: 38.3 % (ref 35.0–47.0)
HGB: 12.7 g/dL (ref 12.0–16.0)
MCH: 26.9 pg (ref 26.0–34.0)
MCHC: 33.2 g/dL (ref 32.0–36.0)
MCV: 81 fL (ref 80–100)
PLATELETS: 230 10*3/uL (ref 150–440)
RBC: 4.73 10*6/uL (ref 3.80–5.20)
RDW: 13.8 % (ref 11.5–14.5)
WBC: 8.4 10*3/uL (ref 3.6–11.0)

## 2014-08-01 LAB — BASIC METABOLIC PANEL
Anion Gap: 10 (ref 7–16)
BUN: 18 mg/dL (ref 7–18)
CHLORIDE: 106 mmol/L (ref 98–107)
CO2: 26 mmol/L (ref 21–32)
CREATININE: 0.94 mg/dL (ref 0.60–1.30)
Calcium, Total: 8.5 mg/dL (ref 8.5–10.1)
EGFR (Non-African Amer.): >60
Glucose: 229 mg/dL — ABNORMAL HIGH (ref 65–99)
Osmolality: 292 (ref 275–301)
Potassium: 3.6 mmol/L (ref 3.5–5.1)
SODIUM: 142 mmol/L (ref 136–145)

## 2015-01-03 LAB — HM PAP SMEAR: HM PAP: NORMAL

## 2015-01-19 ENCOUNTER — Ambulatory Visit: Payer: Self-pay | Admitting: Obstetrics and Gynecology

## 2015-01-19 LAB — HEMOGLOBIN A1C: Hgb A1c MFr Bld: 9.5 % — AB (ref 4.0–6.0)

## 2015-01-19 IMAGING — MG MM DIGITAL SCREENING BILAT W/ CAD
4 series · 4 of 4 positions shown · non-contrast
Comparison: Previous exam(s).

CLINICAL DATA: Screening.

EXAM:
DIGITAL SCREENING BILATERAL MAMMOGRAM WITH CAD

[L MLO]
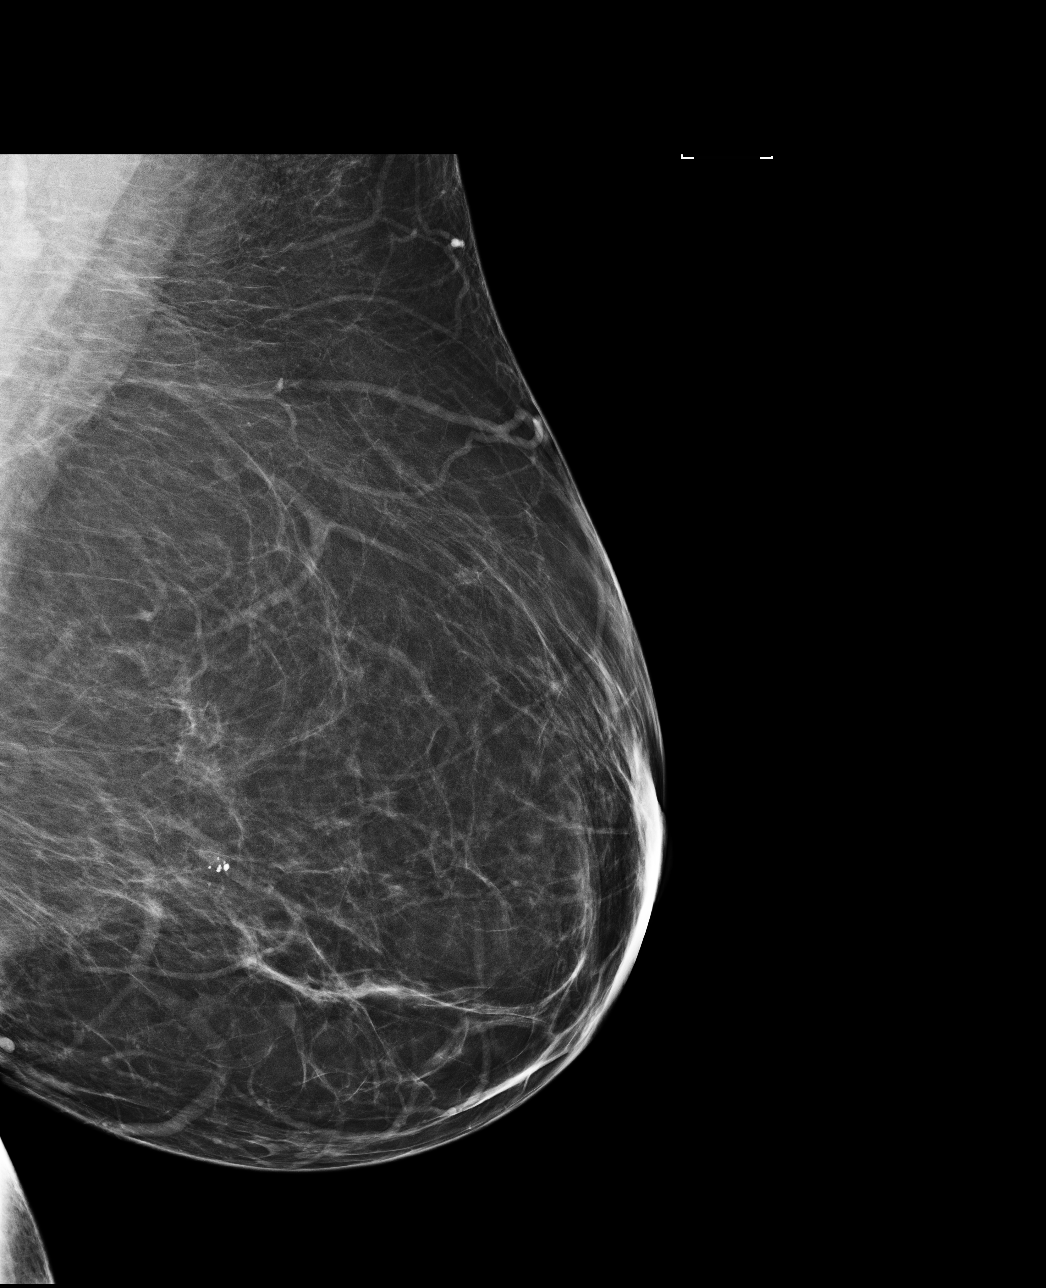

[L CC]
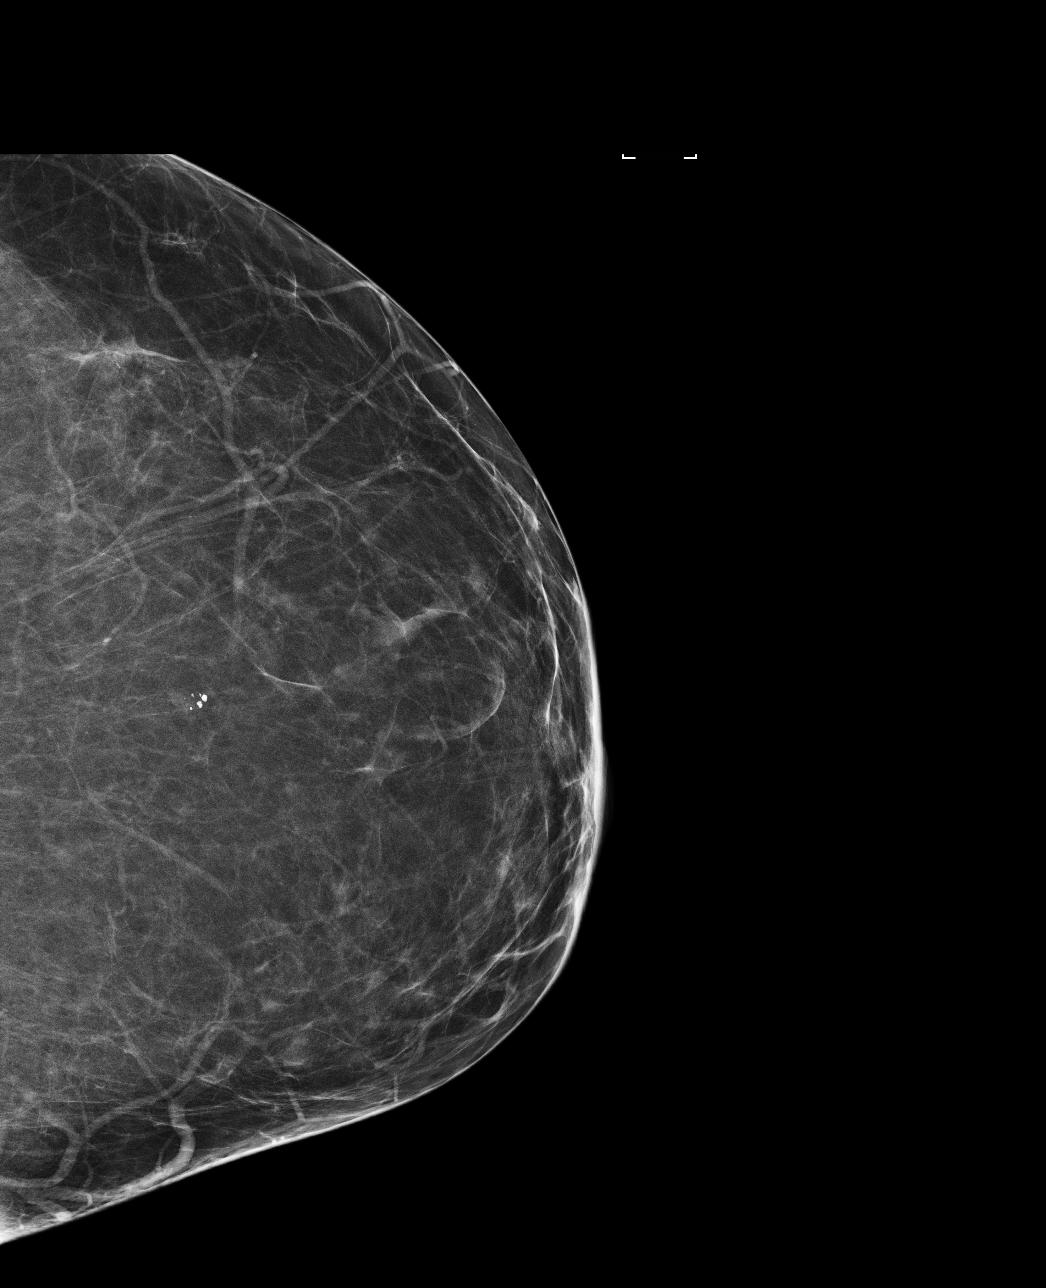

[R MLO]
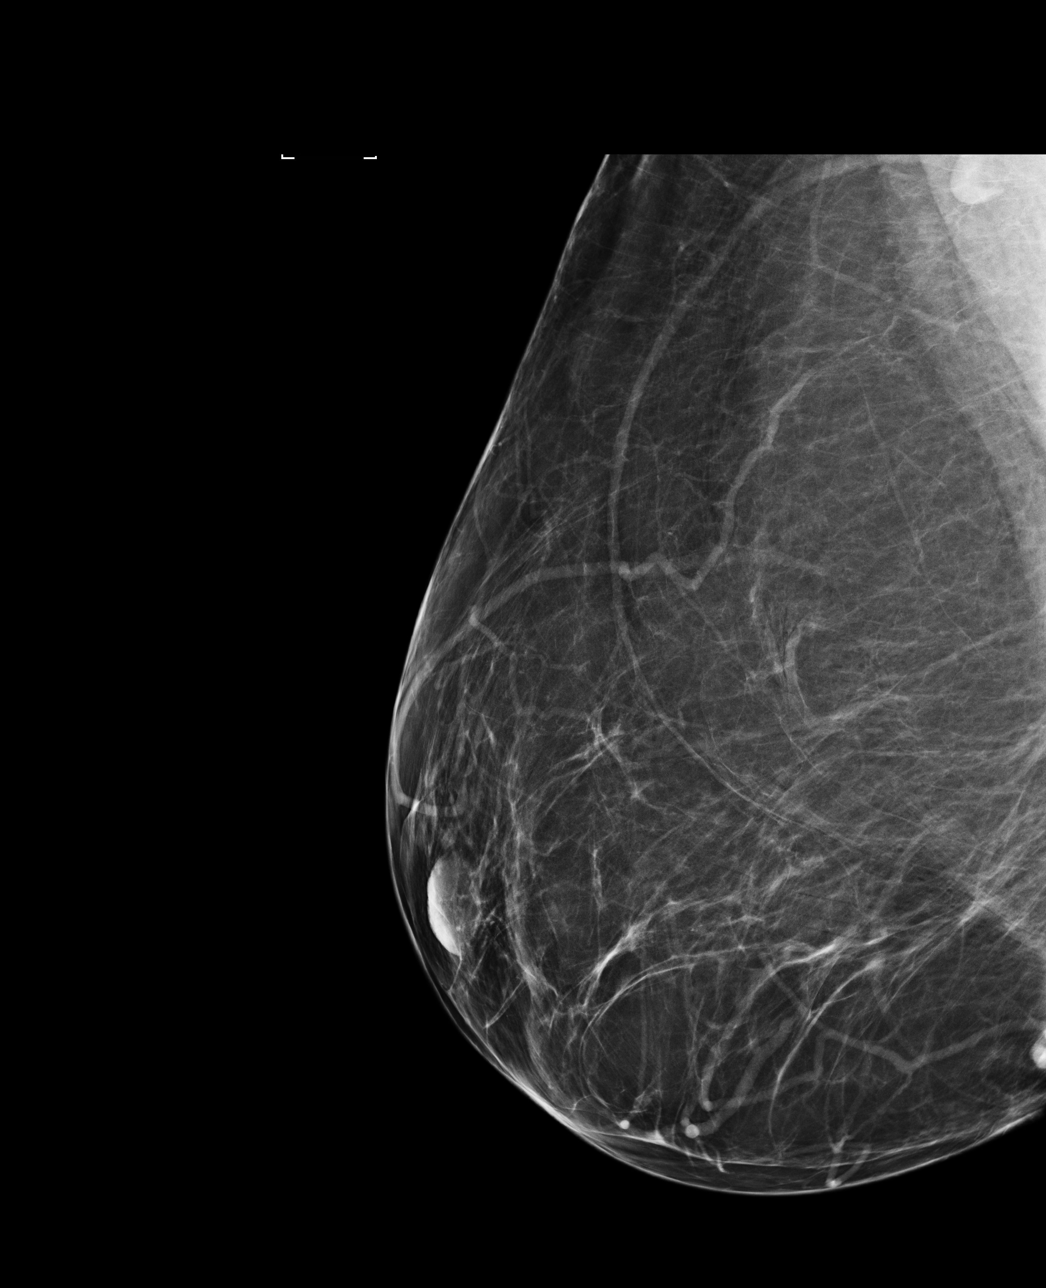

[R CC]
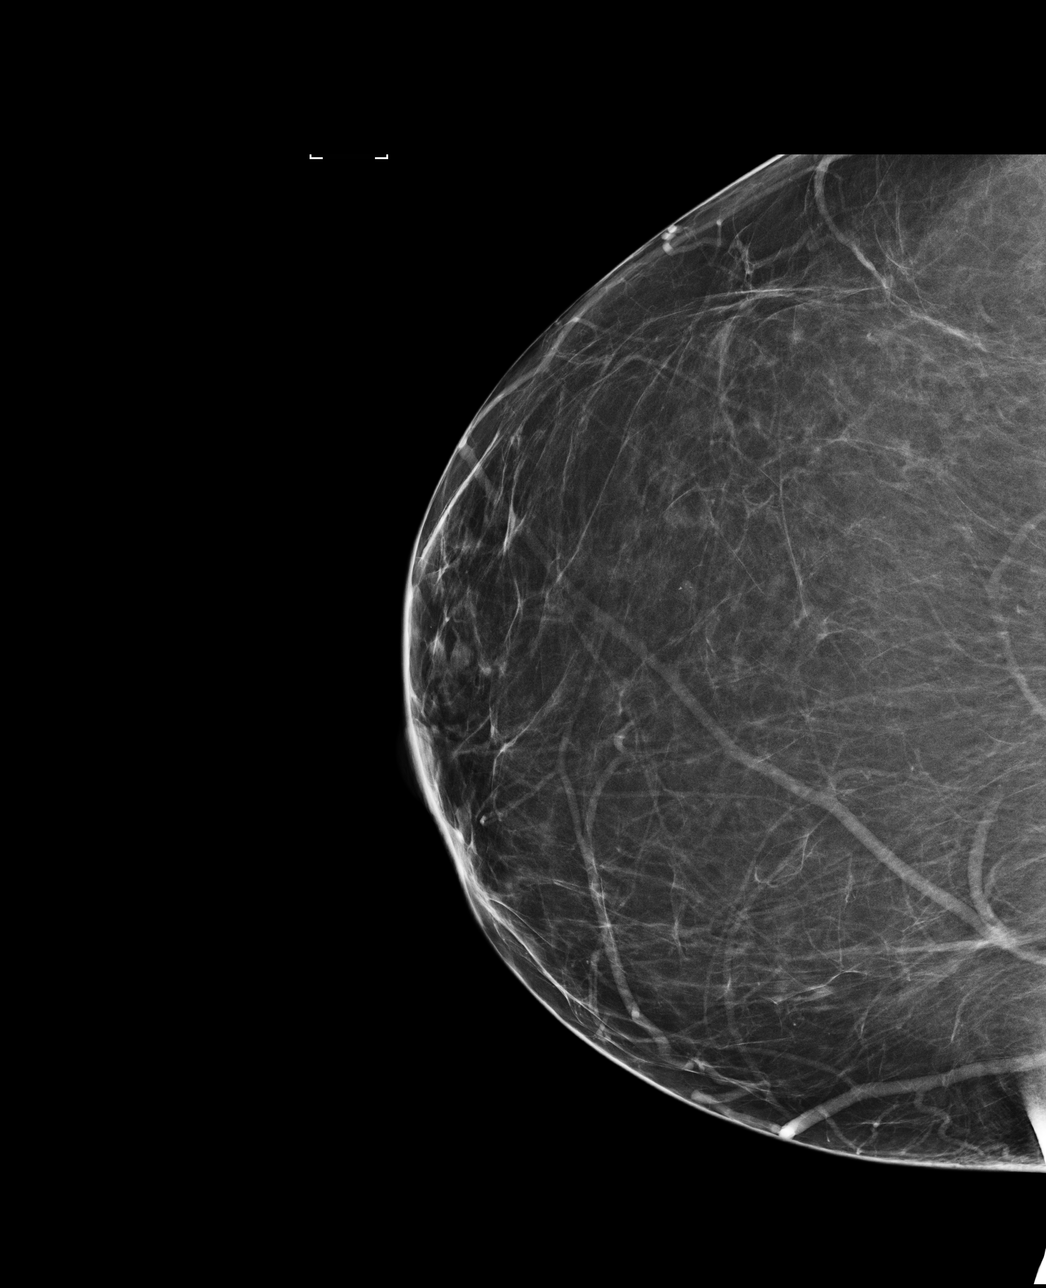

[4 of 4 positions shown; findings below may reference images not displayed]

ACR Breast Density Category b: There are scattered areas of
fibroglandular density.
FINDINGS: There are no findings suspicious for malignancy. Images were
processed with CAD.
IMPRESSION: No mammographic evidence of malignancy. A result letter of this
screening mammogram will be mailed directly to the patient.

RECOMMENDATION:
Screening mammogram in one year. (Code:[US])

BI-RADS CATEGORY  1: Negative.

## 2015-05-25 ENCOUNTER — Other Ambulatory Visit: Payer: Self-pay | Admitting: Family Medicine

## 2015-05-25 ENCOUNTER — Telehealth: Payer: Self-pay | Admitting: Family Medicine

## 2015-05-25 ENCOUNTER — Telehealth: Payer: Self-pay

## 2015-05-25 NOTE — Telephone Encounter (Signed)
Did you refill and print rx?

## 2015-05-25 NOTE — Telephone Encounter (Signed)
Patient needs appointment before she can have a refill, has not been seen since Jan.  Please schedule first available appointment

## 2015-05-25 NOTE — Telephone Encounter (Signed)
rx refilled sent to pharmacy

## 2015-05-25 NOTE — Telephone Encounter (Signed)
Pt needs refill on alprazolam and would like to pick up RX today?

## 2015-05-25 NOTE — Telephone Encounter (Signed)
She needs to be seen for that medication. Alprazolam is a controlled medication and she has not been seen since 12/2014

## 2015-05-25 NOTE — Telephone Encounter (Signed)
Patient scheduled an appt for the end of the month.

## 2015-06-17 ENCOUNTER — Encounter: Payer: Self-pay | Admitting: Family Medicine

## 2015-06-17 DIAGNOSIS — E785 Hyperlipidemia, unspecified: Secondary | ICD-10-CM | POA: Insufficient documentation

## 2015-06-17 DIAGNOSIS — Z6841 Body Mass Index (BMI) 40.0 and over, adult: Secondary | ICD-10-CM

## 2015-06-17 DIAGNOSIS — B356 Tinea cruris: Secondary | ICD-10-CM | POA: Insufficient documentation

## 2015-06-17 DIAGNOSIS — IMO0002 Reserved for concepts with insufficient information to code with codable children: Secondary | ICD-10-CM | POA: Insufficient documentation

## 2015-06-17 DIAGNOSIS — I10 Essential (primary) hypertension: Secondary | ICD-10-CM | POA: Insufficient documentation

## 2015-06-17 DIAGNOSIS — F330 Major depressive disorder, recurrent, mild: Secondary | ICD-10-CM | POA: Insufficient documentation

## 2015-06-17 DIAGNOSIS — E1165 Type 2 diabetes mellitus with hyperglycemia: Secondary | ICD-10-CM | POA: Insufficient documentation

## 2015-06-17 DIAGNOSIS — E559 Vitamin D deficiency, unspecified: Secondary | ICD-10-CM | POA: Insufficient documentation

## 2015-06-17 DIAGNOSIS — M1711 Unilateral primary osteoarthritis, right knee: Secondary | ICD-10-CM | POA: Insufficient documentation

## 2015-06-19 ENCOUNTER — Encounter: Payer: Self-pay | Admitting: Family Medicine

## 2015-06-19 ENCOUNTER — Ambulatory Visit (INDEPENDENT_AMBULATORY_CARE_PROVIDER_SITE_OTHER): Payer: BC Managed Care – PPO | Admitting: Family Medicine

## 2015-06-19 VITALS — BP 124/84 | HR 78 | Temp 97.8°F | Resp 18 | Ht 65.0 in | Wt 284.3 lb

## 2015-06-19 DIAGNOSIS — E785 Hyperlipidemia, unspecified: Secondary | ICD-10-CM | POA: Diagnosis not present

## 2015-06-19 DIAGNOSIS — G4700 Insomnia, unspecified: Secondary | ICD-10-CM | POA: Diagnosis not present

## 2015-06-19 DIAGNOSIS — F5105 Insomnia due to other mental disorder: Secondary | ICD-10-CM | POA: Insufficient documentation

## 2015-06-19 DIAGNOSIS — E1165 Type 2 diabetes mellitus with hyperglycemia: Secondary | ICD-10-CM

## 2015-06-19 DIAGNOSIS — L309 Dermatitis, unspecified: Secondary | ICD-10-CM

## 2015-06-19 DIAGNOSIS — M62838 Other muscle spasm: Secondary | ICD-10-CM

## 2015-06-19 DIAGNOSIS — F418 Other specified anxiety disorders: Secondary | ICD-10-CM | POA: Diagnosis not present

## 2015-06-19 DIAGNOSIS — F32A Depression, unspecified: Secondary | ICD-10-CM

## 2015-06-19 DIAGNOSIS — M6248 Contracture of muscle, other site: Secondary | ICD-10-CM | POA: Diagnosis not present

## 2015-06-19 DIAGNOSIS — F329 Major depressive disorder, single episode, unspecified: Secondary | ICD-10-CM

## 2015-06-19 DIAGNOSIS — I10 Essential (primary) hypertension: Secondary | ICD-10-CM

## 2015-06-19 DIAGNOSIS — E559 Vitamin D deficiency, unspecified: Secondary | ICD-10-CM | POA: Diagnosis not present

## 2015-06-19 DIAGNOSIS — Z6841 Body Mass Index (BMI) 40.0 and over, adult: Secondary | ICD-10-CM

## 2015-06-19 DIAGNOSIS — IMO0002 Reserved for concepts with insufficient information to code with codable children: Secondary | ICD-10-CM

## 2015-06-19 DIAGNOSIS — F419 Anxiety disorder, unspecified: Secondary | ICD-10-CM

## 2015-06-19 LAB — POCT GLYCOSYLATED HEMOGLOBIN (HGB A1C): HEMOGLOBIN A1C: 8.5

## 2015-06-19 LAB — POCT UA - MICROALBUMIN: Microalbumin Ur, POC: 20 mg/L

## 2015-06-19 MED ORDER — METFORMIN HCL 850 MG PO TABS: 850.0000 mg | ORAL_TABLET | Freq: Three times a day (TID) | ORAL | Status: DC

## 2015-06-19 MED ORDER — ALPRAZOLAM ER 0.5 MG PO TB24: 0.5000 mg | ORAL_TABLET | Freq: Every day | ORAL | Status: DC

## 2015-06-19 MED ORDER — BACLOFEN 10 MG PO TABS
10.0000 mg | ORAL_TABLET | Freq: Two times a day (BID) | ORAL | Status: DC | PRN
Start: 2015-06-19 — End: 2016-01-16

## 2015-06-19 MED ORDER — GLIPIZIDE ER 10 MG PO TB24: 10.0000 mg | ORAL_TABLET | Freq: Every day | ORAL | Status: DC

## 2015-06-19 MED ORDER — FLUOCINONIDE 0.1 % EX CREA: 1.0000 "application " | TOPICAL_CREAM | Freq: Every day | CUTANEOUS | Status: DC

## 2015-06-19 MED ORDER — LISINOPRIL-HYDROCHLOROTHIAZIDE 20-12.5 MG PO TABS: 1.0000 | ORAL_TABLET | Freq: Every morning | ORAL | Status: DC

## 2015-06-19 MED ORDER — PITAVASTATIN CALCIUM 2 MG PO TABS: 1.0000 | ORAL_TABLET | Freq: Every day | ORAL | Status: DC

## 2015-06-19 NOTE — Progress Notes (Signed)
Name: Lindsay Mcdonald   MRN: [id number]    DOB: [birth year]   Date:06/19/2015       Progress Note  Subjective  Chief Complaint  Chief Complaint  Patient presents with  . Medication Refill    6 month F/U  . Diabetes    Does not check BS at home  . Hypertension  . Hyperlipidemia    All Statin medication give patient muscle cramps.   . Anxiety    Unchanged-due to stress related  . Allergies    Onset years-SOB, Headache, Cough, Sneezing. Improving    HPI   DMII: she is not checking her fsbs at home, she has been compliant with Metformin and Glipizide, but stopped using Bydureon, did not feel comfortable using injectables. HgbA1C is not at goal but has improved. She is resistant in taking new medications. We will try increasing dose of Metformin to 3 pills daily . Needs eye exam .  HTN: Taking medication as prescribed and bp is at goal, urine micro 20 today. Continue Ace for kidney protection  Hyperlipidemia: stopped taking pravastatin because she states it caused muscle aches, even on CoQ 10. She also was unable to tolerate  other statins including : Crestor, Atorvastatin and Simvastatin.   Anxiety/Depression: She states she did not take Zoloft for a long time, taking Alprazolam XR 0.5mg . She still has blue days, crying spells, denies suicidal thoughts or ideation lately.   Insomnia: taking Ibuprofen PM to sleep every night. Explained it can cause increase in her BP, should try Tylenol pm instead    Patient Active Problem List   Diagnosis Date Noted  . Muscle spasms of neck 06/19/2015  . Insomnia 06/19/2015  . Osteoarthritis, knee 06/17/2015  . Anxiety and depression 06/17/2015  . Diabetes mellitus type 2, uncontrolled 06/17/2015  . Dyslipidemia 06/17/2015  . Benign hypertension 06/17/2015  . Morbid obesity with BMI of 40.0-44.9, adult 06/17/2015  . Vitamin D deficiency 06/17/2015    Past Surgical History  Procedure Laterality Date  . Breast reduction surgery    . Nasal  endoscopic    . Removal of ovary      Family History  Problem Relation Age of Onset  . Diabetes Mother   . Cancer Mother 36    Breast  . Diabetes Daughter     Oldest Daughter  . Stroke Maternal Uncle     History   Social History  . Marital Status: Single    Spouse Name: N/A  . Number of Children: N/A  . Years of Education: N/A   Occupational History  . Not on file.   Social History Main Topics  . Smoking status: Never Smoker   . Smokeless tobacco: Never Used  . Alcohol Use: No  . Drug Use: No  . Sexual Activity: Yes    Birth Control/ Protection: None   Other Topics Concern  . Not on file   Social History Narrative     Current outpatient prescriptions:  .  ALPRAZolam (ALPRAZOLAM XR) 0.5 MG 24 hr tablet, Take 1 tablet (0.5 mg total) by mouth daily., Disp: 30 tablet, Rfl: 2 .  Ascorbic Acid (VITAMIN C) 100 MG tablet, Take 100 mg by mouth daily., Disp: , Rfl:  .  aspirin 81 MG tablet, Take 81 mg by mouth daily., Disp: , Rfl:  .  baclofen (LIORESAL) 10 MG tablet, Take 1 tablet (10 mg total) by mouth 2 (two) times daily as needed., Disp: 30 each, Rfl: 0 .  Biotin 10 MG CAPS,  Take 10 mg by mouth once., Disp: , Rfl:  .  cholecalciferol (VITAMIN D) 1000 UNITS tablet, Take 1,000 Units by mouth 2 (two) times daily.  , Disp: , Rfl:  .  glipiZIDE (GLUCOTROL XL) 10 MG 24 hr tablet, Take 1 tablet (10 mg total) by mouth daily., Disp: 30 tablet, Rfl: 2 .  glucose blood (ONETOUCH VERIO) test strip, ONETOUCH VERIO (In Vitro Strip)  1 (one) Strip Strip check fsbs once daily for 30 days  Quantity: 1;  Refills: 2   Ordered :13-July-2014  Trenda Moots ;  Started 13-July-2014 Active, Disp: , Rfl:  .  GLUCOSE BLOOD VI, , Disp: , Rfl:  .  lisinopril-hydrochlorothiazide (PRINZIDE,ZESTORETIC) 20-12.5 MG per tablet, Take 1 tablet by mouth every morning., Disp: 30 tablet, Rfl: 2 .  magnesium 30 MG tablet, Take 30 mg by mouth., Disp: , Rfl:  .  metFORMIN (GLUCOPHAGE) 850 MG tablet, Take 1  tablet (850 mg total) by mouth 3 (three) times daily., Disp: 90 tablet, Rfl: 2 .  Fluocinonide (VANOS) 0.1 % CREA, Apply 1 application topically daily., Disp: 30 g, Rfl: 0 .  Multiple Vitamin (MULITIVITAMIN WITH MINERALS) TABS, Take 1 tablet by mouth daily.  , Disp: , Rfl:   Allergies  Allergen Reactions  . Atorvastatin     and pravastatin caused muscle cramps, also simvastatin  . Compazine Other (See Comments)    Twitching and spasms of neck muscles  . Hydrochlorothiazide   . Penicillins     rash     ROS  Constitutional: Negative for fever or weight change.  Respiratory: Negative for cough .Symptoms of cold/allergy resolved now  SOB : states sometimes has to take a deep breath to catch her breath, only noticed once this past 6 month - no chest pain or wheezing with it Cardiovascular: Negative for chest pain or palpitations.  Gastrointestinal: Negative for abdominal pain, no bowel changes.  Musculoskeletal: Negative for gait problem or joint swelling.  Skin: Negative for rash.  Neurological: Negative for dizziness or headache.  No other specific complaints in a complete review of systems (except as listed in HPI above).   Objective  Filed Vitals:   06/19/15 1044  BP: 124/84  Pulse: 78  Temp: 97.8 F (36.6 C)  TempSrc: Oral  Resp: 18  Height: 5\' 5"  (1.651 m)  Weight: 284 lb 4.8 oz (128.958 kg)  SpO2: 98%    Body mass index is 47.31 kg/(m^2).  Physical Exam    Constitutional: Patient appears well-developed and well-nourished. No distress.  Eyes:  No scleral icterus. PERL Neck: Normal range of motion. Neck supple. No thyromegaly Cardiovascular: Normal rate, regular rhythm and normal heart sounds.  No murmur heard. No BLE edema. Pulmonary/Chest: Effort normal and breath sounds normal. No respiratory distress. Abdominal: Soft.  There is no tenderness. Psychiatric: Patient has a normal mood and affect. behavior is normal. Judgment and thought content normal.  Recent  Results (from the past 2160 hour(s))  POCT HgB A1C     Status: Abnormal   Collection Time: 06/19/15 10:40 AM  Result Value Ref Range   Hemoglobin A1C 8.5   POCT UA - Microalbumin     Status: None   Collection Time: 06/19/15 10:40 AM  Result Value Ref Range   Microalbumin Ur, POC 20 mg/L   Creatinine, POC  mg/dL   Albumin/Creatinine Ratio, Urine, POC      Diabetic Foot Exam - Simple   Simple Foot Form  Visual Inspection  No deformities, no ulcerations, no  other skin breakdown bilaterally:  Yes  Sensation Testing  Intact to touch and monofilament testing bilaterally:  Yes  Pulse Check  Posterior Tibialis and Dorsalis pulse intact bilaterally:  Yes  Comments       PHQ2/9: Depression screen PHQ 2/9 06/19/2015  Decreased Interest 0  Down, Depressed, Hopeless 1  PHQ - 2 Score 1     Fall Risk: Fall Risk  06/19/2015  Falls in the past year? No     Assessment & Plan  1. Type II diabetes mellitus, uncontrolled Increase dose of metformin, she is afraid of taking other medication - POCT HgB A1C - POCT UA - Microalbumin - metFORMIN (GLUCOPHAGE) 850 MG tablet; Take 1 tablet (850 mg total) by mouth 3 (three) times daily.  Dispense: 90 tablet; Refill: 2 - glipiZIDE (GLUCOTROL XL) 10 MG 24 hr tablet; Take 1 tablet (10 mg total) by mouth daily.  Dispense: 30 tablet; Refill: 2  2. Morbid obesity with BMI of 40.0-44.9, adult Discussed diet and exercise  3. Benign hypertension At goal  - lisinopril-hydrochlorothiazide (PRINZIDE,ZESTORETIC) 20-12.5 MG per tablet; Take 1 tablet by mouth every morning.  Dispense: 30 tablet; Refill: 2 - Comprehensive Metabolic Panel (CMET)  4. Dyslipidemia Unable to tolerate statis, discussed trying Livalo and gave her a voucher today - Lipid Profile - Livalo 2mg  daily #30 and 2 refills  5. Anxiety and depression She is not in remission, but does not want to take SSRI - ALPRAZolam (ALPRAZOLAM XR) 0.5 MG 24 hr tablet; Take 1 tablet (0.5 mg total)  by mouth daily.  Dispense: 30 tablet; Refill: 2  6. Muscle spasms of neck Continue prn medication - baclofen (LIORESAL) 10 MG tablet; Take 1 tablet (10 mg total) by mouth 2 (two) times daily as needed.  Dispense: 30 each; Refill: 0  7. Insomnia Advised to stop Ibuprofen pm and try Tylenol pm instead  8. Eczema Round spot on upper back with excoriation, chronic , resume medication - Fluocinonide (VANOS) 0.1 % CREA; Apply 1 application topically daily.  Dispense: 30 g; Refill: 0  9. Vitamin D deficiency  - Vitamin D (25 hydroxy)

## 2015-06-19 NOTE — Patient Instructions (Signed)

## 2015-06-20 LAB — COMPREHENSIVE METABOLIC PANEL
A/G RATIO: 1.6 (ref 1.1–2.5)
ALT: 30 IU/L (ref 0–32)
AST: 24 IU/L (ref 0–40)
Albumin: 4.1 g/dL (ref 3.5–5.5)
Alkaline Phosphatase: 100 IU/L (ref 39–117)
BUN/Creatinine Ratio: 24 — ABNORMAL HIGH (ref 9–23)
BUN: 15 mg/dL (ref 6–24)
Bilirubin Total: 0.3 mg/dL (ref 0.0–1.2)
CO2: 26 mmol/L (ref 18–29)
Calcium: 9.8 mg/dL (ref 8.7–10.2)
Chloride: 97 mmol/L (ref 97–108)
Creatinine, Ser: 0.63 mg/dL (ref 0.57–1.00)
GFR calc Af Amer: 120 mL/min/{1.73_m2} (ref >59)
GFR, EST NON AFRICAN AMERICAN: 104 mL/min/{1.73_m2} (ref >59)
Globulin, Total: 2.6 g/dL (ref 1.5–4.5)
Glucose: 186 mg/dL — ABNORMAL HIGH (ref 65–99)
POTASSIUM: 4.4 mmol/L (ref 3.5–5.2)
SODIUM: 139 mmol/L (ref 134–144)
Total Protein: 6.7 g/dL (ref 6.0–8.5)

## 2015-06-20 LAB — LIPID PANEL
CHOLESTEROL TOTAL: 217 mg/dL — AB (ref 100–199)
Chol/HDL Ratio: 3.2 ratio units (ref 0.0–4.4)
HDL: 68 mg/dL (ref >39)
LDL Calculated: 119 mg/dL — ABNORMAL HIGH (ref 0–99)
Triglycerides: 150 mg/dL — ABNORMAL HIGH (ref 0–149)
VLDL CHOLESTEROL CAL: 30 mg/dL (ref 5–40)

## 2015-06-20 LAB — VITAMIN D 25 HYDROXY (VIT D DEFICIENCY, FRACTURES): VIT D 25 HYDROXY: 23.4 ng/mL — AB (ref 30.0–100.0)

## 2015-06-20 NOTE — Progress Notes (Signed)
Patient notified

## 2015-06-29 ENCOUNTER — Ambulatory Visit: Payer: Self-pay | Admitting: Obstetrics and Gynecology

## 2015-07-06 ENCOUNTER — Ambulatory Visit: Payer: Self-pay | Admitting: Obstetrics and Gynecology

## 2015-07-19 ENCOUNTER — Ambulatory Visit: Payer: Self-pay | Admitting: Obstetrics and Gynecology

## 2015-07-24 ENCOUNTER — Ambulatory Visit: Payer: Self-pay | Admitting: Obstetrics and Gynecology

## 2015-08-20 LAB — HM DIABETES EYE EXAM

## 2015-09-14 ENCOUNTER — Ambulatory Visit: Payer: Self-pay | Admitting: Obstetrics and Gynecology

## 2015-09-14 LAB — HM DIABETES EYE EXAM

## 2015-09-20 ENCOUNTER — Ambulatory Visit: Payer: BC Managed Care – PPO | Admitting: Family Medicine

## 2015-09-20 ENCOUNTER — Ambulatory Visit: Payer: Self-pay | Admitting: Obstetrics and Gynecology

## 2015-09-26 ENCOUNTER — Telehealth: Payer: Self-pay | Admitting: Family Medicine

## 2015-09-26 ENCOUNTER — Ambulatory Visit (INDEPENDENT_AMBULATORY_CARE_PROVIDER_SITE_OTHER): Payer: BC Managed Care – PPO | Admitting: Family Medicine

## 2015-09-26 ENCOUNTER — Encounter: Payer: Self-pay | Admitting: Family Medicine

## 2015-09-26 VITALS — BP 134/84 | HR 101 | Temp 97.9°F | Resp 18 | Ht 65.0 in | Wt 273.5 lb

## 2015-09-26 DIAGNOSIS — E1165 Type 2 diabetes mellitus with hyperglycemia: Secondary | ICD-10-CM | POA: Diagnosis not present

## 2015-09-26 DIAGNOSIS — I10 Essential (primary) hypertension: Secondary | ICD-10-CM | POA: Diagnosis not present

## 2015-09-26 DIAGNOSIS — Z9114 Patient's other noncompliance with medication regimen: Secondary | ICD-10-CM | POA: Diagnosis not present

## 2015-09-26 DIAGNOSIS — E785 Hyperlipidemia, unspecified: Secondary | ICD-10-CM

## 2015-09-26 DIAGNOSIS — Z91148 Patient's other noncompliance with medication regimen for other reason: Secondary | ICD-10-CM

## 2015-09-26 DIAGNOSIS — J302 Other seasonal allergic rhinitis: Secondary | ICD-10-CM

## 2015-09-26 DIAGNOSIS — Z6841 Body Mass Index (BMI) 40.0 and over, adult: Secondary | ICD-10-CM | POA: Diagnosis not present

## 2015-09-26 DIAGNOSIS — E1142 Type 2 diabetes mellitus with diabetic polyneuropathy: Secondary | ICD-10-CM | POA: Diagnosis not present

## 2015-09-26 DIAGNOSIS — E1159 Type 2 diabetes mellitus with other circulatory complications: Secondary | ICD-10-CM

## 2015-09-26 DIAGNOSIS — F330 Major depressive disorder, recurrent, mild: Secondary | ICD-10-CM | POA: Diagnosis not present

## 2015-09-26 DIAGNOSIS — IMO0002 Reserved for concepts with insufficient information to code with codable children: Secondary | ICD-10-CM

## 2015-09-26 LAB — POCT GLYCOSYLATED HEMOGLOBIN (HGB A1C): Hemoglobin A1C: 9.7

## 2015-09-26 MED ORDER — LISINOPRIL-HYDROCHLOROTHIAZIDE 20-12.5 MG PO TABS: 1.0000 | ORAL_TABLET | Freq: Every day | ORAL | Status: DC

## 2015-09-26 MED ORDER — ONETOUCH DELICA LANCETS FINE MISC: 1.0000 [IU] | Freq: Two times a day (BID) | Status: DC

## 2015-09-26 MED ORDER — ALPRAZOLAM ER 0.5 MG PO TB24: 0.5000 mg | ORAL_TABLET | Freq: Every day | ORAL | Status: DC

## 2015-09-26 MED ORDER — GLUCOSE BLOOD VI STRP: 1.0000 | ORAL_STRIP | Freq: Two times a day (BID) | Status: DC

## 2015-09-26 MED ORDER — METFORMIN HCL 850 MG PO TABS: 850.0000 mg | ORAL_TABLET | Freq: Three times a day (TID) | ORAL | Status: DC

## 2015-09-26 MED ORDER — GLIPIZIDE ER 10 MG PO TB24: 10.0000 mg | ORAL_TABLET | Freq: Every day | ORAL | Status: DC

## 2015-09-26 MED ORDER — FLUTICASONE PROPIONATE 50 MCG/ACT NA SUSP: 2.0000 | Freq: Every day | NASAL | Status: DC

## 2015-09-26 NOTE — Telephone Encounter (Signed)
Pt would like a call back

## 2015-09-26 NOTE — Progress Notes (Signed)
Name: Lindsay Mcdonald   MRN: [id number]    DOB: 19-Dec-1961   Date:09/26/2015       Progress Note  Subjective  Chief Complaint  Chief Complaint  Patient presents with  . Medication Management    3 month F/U  . 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  . Hypertension  . Hyperlipidemia  . URI    Onset- couple of week, sneezing, nasal congestion, cough-yellow to brown mucus and getting worst, has taken otc Decongestion with no relief.     HPI  DMII uncontrolled with neuropathy: she states her feet pain is not as severe now.  Patient states she takes Metformin daily but not necessarily three pills daily, stopped taking Glipizide a few months ago, because of cost and also worried about side effects.  She has noticed some blurred vision. Denies polydipsia, polyuria or polyphagia.  She has not been checking her fsbs at home. She denies fatigue. Taking Livalo - only half pill, occasionally, taking aspirin daily, also on ACE  HTN: taking lisinopril/HCTZ without any side effects, bp is at goal, denies chest pain or palpitation  Hyperlipidemia; not compliant with Livalo, taking occasionally and only half pill, discussed increased risk of MI's and CVA's because of her risk factors and the importance of taking medications as prescribed  Major Depression with anxiety: she refuses to take SSRI, she states she feels overwhelmed by a variety of things in her life. She has  anhedonia, difficulty falling asleep, she also has sadness. She has difficulty focusing at work. No recent crying spells. She denies suicidal thoughts or ideation  URI: she states she has sinus infection this time of the year, and used to get antibiotics at work for it. She states she has nasal congestion, post-nasal drainage, but has noticed some improved with recent otc medication. No headaches, or sinus pressure, but mucus is  green   Patient Active Problem List   Diagnosis Date Noted  . Diabetic peripheral neuropathy associated with type 2 diabetes mellitus ([place]) 09/26/2015  . Non compliance w medication regimen 09/26/2015  . Muscle spasms of neck 06/19/2015  . Insomnia 06/19/2015  . Osteoarthritis, knee 06/17/2015  . Depression, major, recurrent, mild ([place]) 06/17/2015  . Diabetes mellitus type 2, uncontrolled ([place]) 06/17/2015  . Dyslipidemia 06/17/2015  . Benign hypertension 06/17/2015  . Morbid obesity with BMI of 40.0-44.9, adult ([place]) 06/17/2015  . Vitamin D deficiency 06/17/2015    Past Surgical History  Procedure Laterality Date  . Breast reduction surgery    . Nasal endoscopic    . Removal of ovary      Family History  Problem Relation Age of Onset  . Diabetes Mother   . Cancer Mother 53    Breast  . Diabetes Daughter     Oldest Daughter  . Stroke Maternal Uncle     Social History   Social History  . Marital Status: Single    Spouse Name: N/A  . Number of Children: N/A  . Years of Education: N/A   Occupational History  . Not on file.   Social History Main Topics  . Smoking status: Never Smoker   . Smokeless tobacco: Never Used  . Alcohol Use: No  . Drug Use: No  . Sexual Activity: Yes    Birth Control/ Protection: None   Other Topics Concern  . Not on  file   Social History Narrative     Current outpatient prescriptions:  .  ALPRAZolam (ALPRAZOLAM XR) 0.5 MG 24 hr tablet, Take 1 tablet (0.5 mg total) by mouth daily., Disp: 30 tablet, Rfl: 2 .  Ascorbic Acid (VITAMIN C) 100 MG tablet, Take 100 mg by mouth daily., Disp: , Rfl:  .  aspirin 81 MG tablet, Take 81 mg by mouth daily., Disp: , Rfl:  .  baclofen (LIORESAL) 10 MG tablet, Take 1 tablet (10 mg total) by mouth 2 (two) times daily as needed., Disp: 30 each, Rfl: 0 .  Biotin 10 MG CAPS, Take 10 mg by mouth once., Disp: , Rfl:  .  cholecalciferol (VITAMIN D) 1000 UNITS tablet, Take 1,000 Units by mouth 2 (two)  times daily.  , Disp: , Rfl:  .  glipiZIDE (GLUCOTROL XL) 10 MG 24 hr tablet, Take 1 tablet (10 mg total) by mouth daily., Disp: 90 tablet, Rfl: 0 .  glucose blood (ONETOUCH VERIO) test strip, 1 each by Other route 2 (two) times daily. Use as instructed, Disp: 100 each, Rfl: 2 .  GLUCOSE BLOOD VI, , Disp: , Rfl:  .  lisinopril-hydrochlorothiazide (PRINZIDE,ZESTORETIC) 20-12.5 MG tablet, Take 1 tablet by mouth daily., Disp: 90 tablet, Rfl: 1 .  magnesium 30 MG tablet, Take 30 mg by mouth., Disp: , Rfl:  .  metFORMIN (GLUCOPHAGE) 850 MG tablet, Take 1 tablet (850 mg total) by mouth 3 (three) times daily., Disp: 270 tablet, Rfl: 1 .  Multiple Vitamin (MULITIVITAMIN WITH MINERALS) TABS, Take 1 tablet by mouth daily.  , Disp: , Rfl:  .  ONETOUCH DELICA LANCETS FINE MISC, 1 Units by Does not apply route 2 (two) times daily., Disp: 100 each, Rfl: 2 .  Pitavastatin Calcium (LIVALO) 2 MG TABS, Take 1 tablet (2 mg total) by mouth daily., Disp: 30 tablet, Rfl: 2  Allergies  Allergen Reactions  . Atorvastatin     and pravastatin caused muscle cramps, also simvastatin  . Compazine Other (See Comments)    Twitching and spasms of neck muscles  . Penicillins     rash     ROS  Constitutional: Negative for fever or significant weight change.  Respiratory: Negative for cough and shortness of breath.   Cardiovascular: Negative for chest pain or palpitations.  Gastrointestinal: Negative for abdominal pain, no bowel changes.  Musculoskeletal: Negative for gait problem or joint swelling.  Skin: Negative for rash.  Neurological: Negative for dizziness or headache.  No other specific complaints in a complete review of systems (except as listed in HPI above).  Objective  Filed Vitals:   09/26/15 0815  BP: 134/84  Pulse: 101  Temp: 97.9 F (36.6 C)  TempSrc: Oral  Resp: 18  Height: 5\' 5"  (1.651 m)  Weight: 273 lb 8 oz (124.059 kg)  SpO2: 97%    Body mass index is 45.51 kg/(m^2).  Physical  Exam  Constitutional: Patient appears well-developed and well-nourished. Obese No distress.  HEENT: head atraumatic, normocephalic, pupils equal and reactive to light, ears normal TM, no tenderness during palpation of sinus, neck supple, throat within normal limits Cardiovascular: Normal rate, regular rhythm and normal heart sounds.  No murmur heard. No BLE edema. Pulmonary/Chest: Effort normal and breath sounds normal. No respiratory distress. Abdominal: Soft.  There is no tenderness. Psychiatric: Patient has a normal mood and affect. behavior is normal. Judgment and thought content normal.  Recent Results (from the past 2160 hour(s))  HM DIABETES EYE EXAM  Status: None   Collection Time: 08/20/15 12:00 AM  Result Value Ref Range   HM Diabetic Eye Exam No Retinopathy No Retinopathy    Comment: [place] by Dr. Charlann Boxer  HM DIABETES EYE EXAM     Status: None   Collection Time: 09/14/15 12:00 AM  Result Value Ref Range   HM Diabetic Eye Exam No Retinopathy No Retinopathy  POCT HgB A1C     Status: Abnormal   Collection Time: 09/26/15  8:24 AM  Result Value Ref Range   Hemoglobin A1C 9.7      PHQ2/9: Depression screen [facility] 2/9 09/26/2015 06/19/2015  Decreased Interest 0 0  Down, Depressed, Hopeless 0 1  PHQ - 2 Score 0 1     Fall Risk: Fall Risk  09/26/2015 06/19/2015  Falls in the past year? No No     Functional Status Survey: Is the patient deaf or have difficulty hearing?: No Does the patient have difficulty seeing, even when wearing glasses/contacts?: Yes (contacts) Does the patient have difficulty concentrating, remembering, or making decisions?: No Does the patient have difficulty walking or climbing stairs?: No Does the patient have difficulty dressing or bathing?: No Does the patient have difficulty doing errands alone such as visiting a doctor's office or shopping?: No    Assessment & Plan  1. Uncontrolled type 2 diabetes mellitus with other  circulatory complication ([place])   explained importance of compliance and we will have to refer her to Endo if no improvement of hgbA1C,up to date with eye exam, discussed importance of diabetic teaching class and she will check with her work at [employer] HgB A1C - glipiZIDE (GLUCOTROL XL) 10 MG 24 hr tablet; Take 1 tablet (10 mg total) by mouth daily.  Dispense: 90 tablet; Refill: 0 - glucose blood (ONETOUCH VERIO) test strip; 1 each by Other route 2 (two) times daily. Use as instructed  Dispense: 100 each; Refill: 2 - metFORMIN (GLUCOPHAGE) 850 MG tablet; Take 1 tablet (850 mg total) by mouth 3 (three) times daily.  Dispense: 270 tablet; Refill: 1 - ONETOUCH DELICA LANCETS FINE MISC; 1 Units by Does not apply route 2 (two) times daily.  Dispense: 100 each; Refill: 2  2. Morbid obesity with BMI of 40.0-44.9, adult [facility])  Discussed with the patient the risk posed by an increased BMI. Discussed importance of portion control, calorie counting and at least 150 minutes of physical activity weekly. Avoid sweet beverages and drink more water. Eat at least 6 servings of fruit and vegetables daily   3. Diabetic peripheral neuropathy associated with type 2 diabetes mellitus ([place])  She does not want medications for it  4. Non compliance w medication regimen  Explained the importance of compliance  5. Dyslipidemia  Needs to resume Livalo  6. Depression, major, recurrent, mild ([place])  Discussed SSRI, but because of cost and fear of taking medications she wants to continue on Alprazolam prn  - ALPRAZolam (ALPRAZOLAM XR) 0.5 MG 24 hr tablet; Take 1 tablet (0.5 mg total) by mouth daily.  Dispense: 30 tablet; Refill: 2  7. Benign hypertension  - lisinopril-hydrochlorothiazide (PRINZIDE,ZESTORETIC) 20-12.5 MG tablet; Take 1 tablet by mouth daily.  Dispense: 90 tablet; Refill: 1  8. Seasonal allergic rhinitis  Likely the cause of her symptoms - fluticasone (FLONASE) 50 MCG/ACT nasal spray; Place 2  sprays into both nostrils daily.  Dispense: 16 g; Refill: 2

## 2015-09-26 NOTE — Telephone Encounter (Signed)
Patient called back and stated she did call [facility] and her last mammogram was this year, we just could not see it since we were not the ordering physicians. Mammogram was on January 19, 2015 but the ordering Doctor was Wyatt Mage and it was normal.

## 2015-09-27 ENCOUNTER — Telehealth: Payer: Self-pay | Admitting: Family Medicine

## 2015-09-27 NOTE — Telephone Encounter (Signed)
She can ask pharmacy to break it down in 3 different prescriptions. Nothing cheaper, all of her medications are generics

## 2015-09-27 NOTE — Telephone Encounter (Signed)
Was prescribed Glipizide 3 month supply and the cost has went up, (costing $45) she would like to know if there is something less expensive. Please send to [facility]

## 2015-10-01 NOTE — Telephone Encounter (Signed)
Patient notified and states she might ask the pharmacist to do a one month supply at a time since that medication is generic.

## 2015-10-03 ENCOUNTER — Telehealth: Payer: Self-pay | Admitting: Family Medicine

## 2015-10-03 NOTE — Telephone Encounter (Signed)
Left vm ok to take magnesium and calcium.  Also stated since just started chol. Med give another week and if still having muscle cramps to let us know.  To early to tell if it is from med since just started today.

## 2015-10-03 NOTE — Telephone Encounter (Signed)
Pt stated that she started experiencing muscle cramps in leg this morning and wanted to know if this is due to her restarting the cholesterol medication.  Patient also stated that she is taking magnesium 400mg  and wanted to know if that is ok.  Also would Dr. Ancil Boozer recommend for patient to start taking calcium supplements.

## 2015-12-03 ENCOUNTER — Telehealth: Payer: Self-pay | Admitting: Family Medicine

## 2015-12-03 NOTE — Telephone Encounter (Signed)
Pt states she would like a call back. 

## 2015-12-06 ENCOUNTER — Telehealth: Payer: Self-pay

## 2015-12-06 NOTE — Telephone Encounter (Signed)
Patient called wanting advise on how to wean herself off the Alprazolam XR tablets.

## 2015-12-07 NOTE — Telephone Encounter (Signed)
She has a follow up in 2 weeks, she can try doubling the dose of glipizide until her follow up

## 2015-12-07 NOTE — Telephone Encounter (Signed)
Patient wanted to know if she could increase her Glipizide dosage due to it running high in the 200's to maybe 1 tablet bid instead of qd.

## 2015-12-07 NOTE — Telephone Encounter (Signed)
Stretch the time she takes it. From every 24 hours to every 25 hours and so on,

## 2015-12-10 NOTE — Telephone Encounter (Signed)
Patient notified

## 2015-12-25 ENCOUNTER — Other Ambulatory Visit: Payer: Self-pay | Admitting: Family Medicine

## 2015-12-25 DIAGNOSIS — F330 Major depressive disorder, recurrent, mild: Secondary | ICD-10-CM

## 2015-12-25 DIAGNOSIS — E1165 Type 2 diabetes mellitus with hyperglycemia: Secondary | ICD-10-CM

## 2015-12-25 DIAGNOSIS — E1159 Type 2 diabetes mellitus with other circulatory complications: Secondary | ICD-10-CM

## 2015-12-25 DIAGNOSIS — IMO0002 Reserved for concepts with insufficient information to code with codable children: Secondary | ICD-10-CM

## 2015-12-25 MED ORDER — GLIPIZIDE ER 10 MG PO TB24: 10.0000 mg | ORAL_TABLET | Freq: Every day | ORAL | Status: DC

## 2015-12-25 NOTE — Telephone Encounter (Signed)
Refill request was sent to Dr. Krichna Sowles for approval and submission.  

## 2015-12-25 NOTE — Telephone Encounter (Signed)
Patient had to reschedule appointment for 01-01-15 but will be completely out of glipizide and alprazolam. Would like to know if you could refill those for her. Please send to [email or web address].

## 2015-12-27 ENCOUNTER — Ambulatory Visit: Payer: BC Managed Care – PPO | Admitting: Family Medicine

## 2016-01-02 ENCOUNTER — Encounter: Payer: Self-pay | Admitting: Family Medicine

## 2016-01-02 ENCOUNTER — Ambulatory Visit (INDEPENDENT_AMBULATORY_CARE_PROVIDER_SITE_OTHER): Payer: BC Managed Care – PPO | Admitting: Family Medicine

## 2016-01-02 VITALS — BP 132/78 | HR 100 | Temp 98.0°F | Resp 18 | Ht 65.0 in | Wt 269.4 lb

## 2016-01-02 DIAGNOSIS — E1165 Type 2 diabetes mellitus with hyperglycemia: Secondary | ICD-10-CM

## 2016-01-02 DIAGNOSIS — Z6841 Body Mass Index (BMI) 40.0 and over, adult: Secondary | ICD-10-CM

## 2016-01-02 DIAGNOSIS — I10 Essential (primary) hypertension: Secondary | ICD-10-CM | POA: Diagnosis not present

## 2016-01-02 DIAGNOSIS — E1143 Type 2 diabetes mellitus with diabetic autonomic (poly)neuropathy: Secondary | ICD-10-CM | POA: Diagnosis not present

## 2016-01-02 DIAGNOSIS — E1159 Type 2 diabetes mellitus with other circulatory complications: Secondary | ICD-10-CM

## 2016-01-02 DIAGNOSIS — Z1239 Encounter for other screening for malignant neoplasm of breast: Secondary | ICD-10-CM | POA: Diagnosis not present

## 2016-01-02 DIAGNOSIS — E785 Hyperlipidemia, unspecified: Secondary | ICD-10-CM | POA: Diagnosis not present

## 2016-01-02 DIAGNOSIS — M25512 Pain in left shoulder: Secondary | ICD-10-CM | POA: Diagnosis not present

## 2016-01-02 DIAGNOSIS — Z1211 Encounter for screening for malignant neoplasm of colon: Secondary | ICD-10-CM

## 2016-01-02 DIAGNOSIS — F330 Major depressive disorder, recurrent, mild: Secondary | ICD-10-CM

## 2016-01-02 DIAGNOSIS — F411 Generalized anxiety disorder: Secondary | ICD-10-CM | POA: Diagnosis not present

## 2016-01-02 DIAGNOSIS — IMO0002 Reserved for concepts with insufficient information to code with codable children: Secondary | ICD-10-CM

## 2016-01-02 LAB — POCT UA - MICROALBUMIN: MICROALBUMIN (UR) POC: 20 mg/L

## 2016-01-02 LAB — POCT GLYCOSYLATED HEMOGLOBIN (HGB A1C): Hemoglobin A1C: 7.9

## 2016-01-02 MED ORDER — GLIPIZIDE ER 5 MG PO TB24: 15.0000 mg | ORAL_TABLET | Freq: Every day | ORAL | Status: DC

## 2016-01-02 MED ORDER — LISINOPRIL-HYDROCHLOROTHIAZIDE 20-12.5 MG PO TABS: 1.0000 | ORAL_TABLET | Freq: Every day | ORAL | Status: DC

## 2016-01-02 MED ORDER — PITAVASTATIN CALCIUM 2 MG PO TABS: 1.0000 | ORAL_TABLET | Freq: Every day | ORAL | Status: DC

## 2016-01-02 MED ORDER — ALPRAZOLAM ER 0.5 MG PO TB24: 0.5000 mg | ORAL_TABLET | Freq: Every day | ORAL | Status: DC

## 2016-01-02 MED ORDER — METFORMIN HCL 850 MG PO TABS: 850.0000 mg | ORAL_TABLET | Freq: Three times a day (TID) | ORAL | Status: DC

## 2016-01-02 NOTE — Patient Instructions (Signed)
Shoulder Range of Motion Exercises Shoulder range of motion (ROM) exercises are designed to keep the shoulder moving freely. They are often recommended for people who have shoulder pain. MOVEMENT EXERCISE When you are able, do this exercise 5-6 days per week, or as told by your health care provider. Work toward doing 2 sets of 10 swings. Pendulum Exercise How To Do This Exercise Lying Down 1. Lie face-down on a bed with your abdomen close to the side of the bed. 2. Let your arm hang over the side of the bed. 3. Relax your shoulder, arm, and hand. 4. Slowly and gently swing your arm forward and back. Do not use your neck muscles to swing your arm. They should be relaxed. If you are struggling to swing your arm, have someone gently swing it for you. When you do this exercise for the first time, swing your arm at a 15 degree angle for 15 seconds, or swing your arm 10 times. As pain lessens over time, increase the angle of the swing to 30-45 degrees. 5. Repeat steps 1-4 with the other arm. How To Do This Exercise While Standing 1. Stand next to a sturdy chair or table and hold on to it with your hand.  Bend forward at the waist.  Bend your knees slightly.  Relax your other arm and let it hang limp.  Relax the shoulder blade of the arm that is hanging and let it drop.  While keeping your shoulder relaxed, use body motion to swing your arm in small circles. The first time you do this exercise, swing your arm for about 30 seconds or 10 times. When you do it next time, swing your arm for a little longer.  Stand up tall and relax.  Repeat steps 1-7, this time changing the direction of the circles. 2. Repeat steps 1-8 with the other arm. STRETCHING EXERCISES Do these exercises 3-4 times per day on 5-6 days per week or as told by your health care provider. Work toward holding the stretch for 20 seconds. Stretching Exercise 1 1. Lift your arm straight out in front of you. 2. Bend your arm 90  degrees at the elbow (right angle) so your forearm goes across your body and looks like the letter "L." 3. Use your other arm to gently pull the elbow forward and across your body. 4. Repeat steps 1-3 with the other arm. Stretching Exercise 2 You will need a towel or rope for this exercise. 1. Bend one arm behind your back with the palm facing outward. 2. Hold a towel with your other hand. 3. Reach the arm that holds the towel above your head, and bend that arm at the elbow. Your wrist should be behind your neck. 4. Use your free hand to grab the free end of the towel. 5. With the higher hand, gently pull the towel up behind you. 6. With the lower hand, pull the towel down behind you. 7. Repeat steps 1-6 with the other arm. STRENGTHENING EXERCISES Do each of these exercises at four different times of day (sessions) every day or as told by your health care provider. To begin with, repeat each exercise 5 times (repetitions). Work toward doing 3 sets of 12 repetitions or as told by your health care provider. Strengthening Exercise 1 You will need a light weight for this activity. As you grow stronger, you may use a heavier weight. 1. Standing with a weight in your hand, lift your arm straight out to the side   until it is at the same height as your shoulder. 2. Bend your arm at 90 degrees so that your fingers are pointing to the ceiling. 3. Slowly raise your hand until your arm is straight up in the air. 4. Repeat steps 1-3 with the other arm. Strengthening Exercise 2 You will need a light weight for this activity. As you grow stronger, you may use a heavier weight. 1. Standing with a weight in your hand, gradually move your straight arm in an arc, starting at your side, then out in front of you, then straight up over your head. 2. Gradually move your other arm in an arc, starting at your side, then out in front of you, then straight up over your head. 3. Repeat steps 1-2 with the other  arm. Strengthening Exercise 3 You will need an elastic band for this activity. As you grow stronger, gradually increase the size of the bands or increase the number of bands that you use at one time. 1. While standing, hold an elastic band in one hand and raise that arm up in the air. 2. With your other hand, pull down the band until that hand is by your side. 3. Repeat steps 1-2 with the other arm.   This information is not intended to replace advice given to you by your health care provider. Make sure you discuss any questions you have with your health care provider.   Document Released: 09/06/2003 Document Revised: 04/24/2015 Document Reviewed: 12/04/2014 Elsevier Interactive Patient Education 2016 [employer]  

## 2016-01-02 NOTE — Progress Notes (Signed)
Name: Lindsay Mcdonald   MRN: [id number]    DOB: 1961/03/14   Date:01/02/2016       Progress Note  Subjective  Chief Complaint  Chief Complaint  Patient presents with  . Medication Refill    3 month F/U, patient daughter gave her a massage coupon and needs a note stating she can have it done due to being DM and a varicose vein  . Diabetes    Does not check sugar on a regular basis but when she has it was around 157, has been eating more vegetables and lost 5 pounds since last visit.  [place] Kitchen Hypertension  . Hyperlipidemia  . Allergic Rhinitis     Takes Flonase as needed  . Depression    Has her good days and then her bad days feels like it is more stress    HPI  DMII uncontrolled with neuropathy: she states her feet pain is not as severe now. She has been compliant with medication over the past three months, also increased Glipizide to 20  Mg daily over the holidays because she not compliant with diet and was having glucose above 200 - at local pharmacy . She still has intermittent  some blurred vision. Denies polydipsia, polyuria or polyphagia. She has not been checking her fsbs at home. She denies fatigue. Taking Livalo - only half pill,, taking aspirin daily, also on ACE  HTN: taking lisinopril/HCTZ without any side effects, bp is at goal, denies chest pain or palpitation, no SOB  Hyperlipidemia; she has increased compliance with  Livalo, taking half pill daily instead of a few times weekly.    Major Depression with anxiety: she refuses to take SSRI ( she has tried Zoloft in the past and not sure why she stopped it )  she states she feels overwhelmed by a variety of things in her life. She has anhedonia, difficulty falling asleep, she also has sadness. She has difficulty focusing at work. No recent crying spells. She denies suicidal thoughts or ideation. She states Alprazolam XR 0.5 mg daily and she feels like it is helping with anxiety.   AR: using Flonase prn , she describes symptoms  of nasal congestion and intermittent rhinorrhea  Morbid Obesity: she has lost 5 lbs since last visit, discussed importance of following a healthy diet and continue to try to lose weight.   Left Shoulder pain: started a few months ago, noticed the most when she wakes up in am. She thinks it may have been caused from assisting her aunt get up from bed. No redness, normal rom, pain during pressure to left shoulder/like laying in bed.   Patient Active Problem List   Diagnosis Date Noted  . Diabetic peripheral neuropathy associated with type 2 diabetes mellitus ([place]) 09/26/2015  . Non compliance w medication regimen 09/26/2015  . Seasonal allergic rhinitis 09/26/2015  . Muscle spasms of neck 06/19/2015  . Insomnia 06/19/2015  . Osteoarthritis, knee 06/17/2015  . Depression, major, recurrent, mild ([place]) 06/17/2015  . Diabetes mellitus type 2, uncontrolled ([place]) 06/17/2015  . Dyslipidemia 06/17/2015  . Benign hypertension 06/17/2015  . Morbid obesity with BMI of 40.0-44.9, adult ([place]) 06/17/2015  . Vitamin D deficiency 06/17/2015    Past Surgical History  Procedure Laterality Date  . Breast reduction surgery    . Nasal endoscopic    . Removal of ovary      Family History  Problem Relation Age of Onset  . Diabetes Mother   . Cancer Mother 64  Breast  . Diabetes Daughter     Oldest Daughter  . Stroke Maternal Uncle     Social History   Social History  . Marital Status: Single    Spouse Name: N/A  . Number of Children: N/A  . Years of Education: N/A   Occupational History  . Not on file.   Social History Main Topics  . Smoking status: Never Smoker   . Smokeless tobacco: Never Used  . Alcohol Use: No  . Drug Use: No  . Sexual Activity: Yes    Birth Control/ Protection: None   Other Topics Concern  . Not on file   Social History Narrative     Current outpatient prescriptions:  .  ALPRAZolam (ALPRAZOLAM XR) 0.5 MG 24 hr tablet, Take 1 tablet (0.5 mg total) by  mouth daily., Disp: 30 tablet, Rfl: 2 .  Ascorbic Acid (VITAMIN C) 100 MG tablet, Take 100 mg by mouth daily., Disp: , Rfl:  .  aspirin 81 MG tablet, Take 81 mg by mouth daily., Disp: , Rfl:  .  baclofen (LIORESAL) 10 MG tablet, Take 1 tablet (10 mg total) by mouth 2 (two) times daily as needed., Disp: 30 each, Rfl: 0 .  Biotin 10 MG CAPS, Take 10 mg by mouth once., Disp: , Rfl:  .  cholecalciferol (VITAMIN D) 1000 UNITS tablet, Take 1,000 Units by mouth 2 (two) times daily.  , Disp: , Rfl:  .  fluticasone (FLONASE) 50 MCG/ACT nasal spray, Place 2 sprays into both nostrils daily., Disp: 16 g, Rfl: 2 .  glucose blood (ONETOUCH VERIO) test strip, 1 each by Other route 2 (two) times daily. Use as instructed, Disp: 100 each, Rfl: 2 .  GLUCOSE BLOOD VI, , Disp: , Rfl:  .  lisinopril-hydrochlorothiazide (PRINZIDE,ZESTORETIC) 20-12.5 MG tablet, Take 1 tablet by mouth daily., Disp: 90 tablet, Rfl: 1 .  magnesium 30 MG tablet, Take 30 mg by mouth., Disp: , Rfl:  .  metFORMIN (GLUCOPHAGE) 850 MG tablet, Take 1 tablet (850 mg total) by mouth 3 (three) times daily., Disp: 270 tablet, Rfl: 1 .  Multiple Vitamin (MULITIVITAMIN WITH MINERALS) TABS, Take 1 tablet by mouth daily.  , Disp: , Rfl:  .  ONETOUCH DELICA LANCETS FINE MISC, 1 Units by Does not apply route 2 (two) times daily., Disp: 100 each, Rfl: 2 .  Pitavastatin Calcium (LIVALO) 2 MG TABS, Take 1 tablet (2 mg total) by mouth daily., Disp: 30 tablet, Rfl: 2  Allergies  Allergen Reactions  . Atorvastatin     and pravastatin caused muscle cramps, also simvastatin  . Compazine Other (See Comments)    Twitching and spasms of neck muscles  . Penicillins     rash     ROS  Constitutional: Negative for fever , positive for mild  weight change.  Respiratory: Negative for cough and shortness of breath.   Cardiovascular: Negative for chest pain or palpitations.  Gastrointestinal: Negative for abdominal pain, no bowel changes.  Musculoskeletal:  Negative for gait problem or joint swelling.  Skin: Negative for rash.  Neurological: Negative for dizziness or headache.  No other specific complaints in a complete review of systems (except as listed in HPI above).  Objective  Filed Vitals:   01/02/16 0917  BP: 132/78  Pulse: 100  Temp: 98 F (36.7 C)  TempSrc: Oral  Resp: 18  Height: 5\' 5"  (1.651 m)  Weight: 269 lb 6.4 oz (122.199 kg)  SpO2: 95%    Body mass index is  44.83 kg/(m^2).  Physical Exam  Constitutional: Patient appears well-developed and well-nourished. Obese  No distress.  HEENT: head atraumatic, normocephalic, pupils equal and reactive to light,neck supple, throat within normal limits Cardiovascular: Normal rate, regular rhythm and normal heart sounds.  No murmur heard. No BLE edema. Pulmonary/Chest: Effort normal and breath sounds normal. No respiratory distress. Abdominal: Soft.  There is no tenderness. Psychiatric: Patient has a normal mood and affect. behavior is normal. Judgment and thought content normal. Muscular Skeletal: left anterior shoulder pain, normal ROM, pain during palpation  Recent Results (from the past 2160 hour(s))  POCT HgB A1C     Status: Abnormal   Collection Time: 01/02/16  9:21 AM  Result Value Ref Range   Hemoglobin A1C 7.9   POCT UA - Microalbumin     Status: None   Collection Time: 01/02/16  9:21 AM  Result Value Ref Range   Microalbumin Ur, POC 20 mg/L   Creatinine, POC  mg/dL   Albumin/Creatinine Ratio, Urine, POC       PHQ2/9: Depression screen [facility] 2/9 01/02/2016 09/26/2015 06/19/2015  Decreased Interest 0 0 0  Down, Depressed, Hopeless 1 0 1  PHQ - 2 Score 1 0 1     Fall Risk: Fall Risk  01/02/2016 09/26/2015 06/19/2015  Falls in the past year? No No No      Functional Status Survey: Is the patient deaf or have difficulty hearing?: No Does the patient have difficulty seeing, even when wearing glasses/contacts?: Yes (contacts) Does the patient have difficulty  concentrating, remembering, or making decisions?: No Does the patient have difficulty walking or climbing stairs?: No Does the patient have difficulty dressing or bathing?: No Does the patient have difficulty doing errands alone such as visiting a doctor's office or shopping?: No    Assessment & Plan  1. 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 Breast cancer screening  - MM Digital Screening; Future  3. Morbid obesity with BMI of 40.0-44.9, adult [facility])  Discussed with the patient the risk posed by an increased BMI. Discussed importance of portion control, calorie counting and at least 150 minutes of physical activity weekly. Avoid sweet beverages and drink more water. Eat at least 6 servings of fruit and vegetables daily   4. Depression, major, recurrent, mild (H[facility])  Refuses SSRI - ALPRAZolam (ALPRAZOLAM XR) 0.5 MG 24 hr tablet; Take 1 tablet (0.5 mg total) by mouth daily.  Dispense: 30 tablet; Refill: 2  5. Benign hypertension  - lisinopril-hydrochlorothiazide (PRINZIDE,ZESTORETIC) 20-12.5 MG tablet; Take 1 tablet by mouth daily.  Dispense: 90 tablet; Refill: 1  6. Dyslipidemia  - Pitavastatin Calcium (LIVALO) 2 MG TABS; Take 1 tablet (2 mg total) by mouth daily.  Dispense: 30 tablet; Refill: 2  7. Uncontrolled type 2 diabetes mellitus with other circulatory complication (H[facility])  - metFORMIN (GLUCOPHAGE) 850 MG tablet; Take 1 tablet (850 mg total) by mouth 3 (three) times daily.  Dispense: 270 tablet; Refill: 1 - glipiZIDE (GLUCOTROL XL) 5 MG 24 hr tablet; Take 3 tablets (15 mg total) by mouth daily with breakfast.  Dispense: 90 tablet; Refill: 0  8. GAD (generalized anxiety disorder)  - ALPRAZolam (ALPRAZOLAM XR) 0.5 MG 24 hr tablet; Take 1 tablet (0.5 mg total) by mouth daily.   Dispense: 30 tablet; Refill: 2  9. Left shoulder  pain  Left anterior shoulder pain, likely bursitis - discussed nsaid's cold packs, and rom of activity, if no improvement return for steroid injection but it will cause elevation of serum glucose  10. Colon cancer screening  - Cologuard - patient refuses colonoscopy, she will verify coverage with her insurance

## 2016-01-16 ENCOUNTER — Other Ambulatory Visit: Payer: Self-pay | Admitting: Family Medicine

## 2016-01-31 ENCOUNTER — Ambulatory Visit: Payer: BC Managed Care – PPO | Admitting: Family Medicine

## 2016-03-04 ENCOUNTER — Ambulatory Visit: Payer: BC Managed Care – PPO

## 2016-03-06 ENCOUNTER — Ambulatory Visit
Admission: RE | Admit: 2016-03-06 | Discharge: 2016-03-06 | Disposition: A | Discharge status: Home / Self Care | Payer: BC Managed Care – PPO | Source: Ambulatory Visit | Attending: Family Medicine | Admitting: Family Medicine

## 2016-03-06 DIAGNOSIS — Z1231 Encounter for screening mammogram for malignant neoplasm of breast: Secondary | ICD-10-CM | POA: Insufficient documentation

## 2016-03-06 DIAGNOSIS — Z1239 Encounter for other screening for malignant neoplasm of breast: Secondary | ICD-10-CM

## 2016-03-25 ENCOUNTER — Ambulatory Visit: Payer: BC Managed Care – PPO | Admitting: Family Medicine

## 2016-03-27 ENCOUNTER — Telehealth: Payer: Self-pay

## 2016-03-27 ENCOUNTER — Encounter: Payer: Self-pay | Admitting: Family Medicine

## 2016-03-27 ENCOUNTER — Ambulatory Visit (INDEPENDENT_AMBULATORY_CARE_PROVIDER_SITE_OTHER): Payer: BC Managed Care – PPO | Admitting: Family Medicine

## 2016-03-27 VITALS — BP 118/64 | HR 97 | Temp 98.6°F | Resp 14 | Ht 65.0 in | Wt 265.8 lb

## 2016-03-27 DIAGNOSIS — I10 Essential (primary) hypertension: Secondary | ICD-10-CM

## 2016-03-27 DIAGNOSIS — E1165 Type 2 diabetes mellitus with hyperglycemia: Secondary | ICD-10-CM

## 2016-03-27 DIAGNOSIS — F411 Generalized anxiety disorder: Secondary | ICD-10-CM

## 2016-03-27 DIAGNOSIS — J302 Other seasonal allergic rhinitis: Secondary | ICD-10-CM | POA: Diagnosis not present

## 2016-03-27 DIAGNOSIS — E785 Hyperlipidemia, unspecified: Secondary | ICD-10-CM | POA: Diagnosis not present

## 2016-03-27 DIAGNOSIS — M7552 Bursitis of left shoulder: Secondary | ICD-10-CM | POA: Diagnosis not present

## 2016-03-27 DIAGNOSIS — IMO0002 Reserved for concepts with insufficient information to code with codable children: Secondary | ICD-10-CM

## 2016-03-27 DIAGNOSIS — Q278 Other specified congenital malformations of peripheral vascular system: Secondary | ICD-10-CM | POA: Diagnosis not present

## 2016-03-27 DIAGNOSIS — E1143 Type 2 diabetes mellitus with diabetic autonomic (poly)neuropathy: Secondary | ICD-10-CM

## 2016-03-27 DIAGNOSIS — F330 Major depressive disorder, recurrent, mild: Secondary | ICD-10-CM

## 2016-03-27 DIAGNOSIS — Q279 Congenital malformation of peripheral vascular system, unspecified: Secondary | ICD-10-CM | POA: Insufficient documentation

## 2016-03-27 LAB — POCT GLYCOSYLATED HEMOGLOBIN (HGB A1C): HEMOGLOBIN A1C: 7.4

## 2016-03-27 MED ORDER — GLIPIZIDE ER 5 MG PO TB24: 15.0000 mg | ORAL_TABLET | Freq: Every day | ORAL | Status: DC

## 2016-03-27 MED ORDER — DULOXETINE HCL 30 MG PO CPEP: 30.0000 mg | ORAL_CAPSULE | Freq: Every day | ORAL | Status: DC

## 2016-03-27 MED ORDER — PITAVASTATIN CALCIUM 2 MG PO TABS: 1.0000 | ORAL_TABLET | Freq: Every day | ORAL | Status: DC

## 2016-03-27 MED ORDER — ALPRAZOLAM ER 0.5 MG PO TB24: 0.5000 mg | ORAL_TABLET | Freq: Every day | ORAL | Status: DC

## 2016-03-27 MED ORDER — BACLOFEN 10 MG PO TABS
10.0000 mg | ORAL_TABLET | Freq: Two times a day (BID) | ORAL | Status: DC | PRN
Start: 2016-03-27 — End: 2016-06-27

## 2016-03-27 NOTE — Telephone Encounter (Signed)
Correction will send referral to [facility]

## 2016-03-27 NOTE — Telephone Encounter (Signed)
Ok, will send her referral to [place]. [place].

## 2016-03-27 NOTE — Telephone Encounter (Signed)
Patient called stating that she wanted to be referred to a vein specialist in [facility] called [facility] on [address].

## 2016-03-27 NOTE — Progress Notes (Signed)
Name: Lindsay Mcdonald   MRN: [id number]    DOB: 03-06-1961   Date:03/27/2016       Progress Note  Subjective  Chief Complaint  Chief Complaint  Patient presents with  . Diabetes    Has not checked Blood sugar at home but goes to the pharmacy and they check it.   . Obesity  . Depression    Wants to talk about a different medication.  . Hypertension    Well controlled.  . Dyslipidemia  . Anxiety    HPI  DMII uncontrolled with neuropathy: she states pain on her feet pain is not as severe now. She has been compliant with medication over the past six months, she is on 15 mg of Glipizide daily. She states blurred vision has improved. Denies polydipsia, polyuria or polyphagia. She denies any symptoms of hypoglycemia. She denies fatigue. She ran out of Livalo 2 weeks ago, but will get a refill today, taking aspirin daily, also on ACE  HTN: taking lisinopril/HCTZ without any side effects, bp is at goal, denies chest pain, palpitation, or SOB  Hyperlipidemia; she has increased compliance with Livalo, except for the past couple of weeks, still only taking half pill daily instead of a few times weekly.   Vascular Malformation: she states she has a group of vessels on right outer thigh, that she noticed her 36 's, not growing or painful, but would like to see a vascular surgeon for it  Major Depression with anxiety: she took Zoloft in the past, but she is willing to try something else. She asked me about Latuda - discussed options and we will try Cymbalta She has anhedonia, difficulty falling asleep, she states she is not as sad now. She has difficulty focusing at work. No recent crying spells. She denies suicidal thoughts or ideation. She states Alprazolam XR 0.5 mg daily and she feels like it is helping with anxiety.   Morbid Obesity: she has lost 4 more lbs  since last visit, discussed importance of following a healthy diet and continue to try to lose weight.   Left Shoulder pain:  started a six  months ago, she states symptoms got worse, but she started to take Baclofen every night and is doing much better now. Also doing some home shoulder exercises. She would like to hold off on referral to Ortho or PT at this time. . She thinks it may have been caused from assisting her aunt get up from bed. No redness, normal rom, pain during pressure to left shoulder/like laying in bed.   Patient Active Problem List   Diagnosis Date Noted  . Diabetic peripheral neuropathy associated with type 2 diabetes mellitus ([place]) 09/26/2015  . Non compliance w medication regimen 09/26/2015  . Seasonal allergic rhinitis 09/26/2015  . Muscle spasms of neck 06/19/2015  . Insomnia 06/19/2015  . Osteoarthritis, knee 06/17/2015  . Depression, major, recurrent, mild ([place]) 06/17/2015  . Diabetes mellitus type 2, uncontrolled ([place]) 06/17/2015  . Dyslipidemia 06/17/2015  . Benign hypertension 06/17/2015  . Morbid obesity with BMI of 40.0-44.9, adult ([place]) 06/17/2015  . Vitamin D deficiency 06/17/2015    Past Surgical History  Procedure Laterality Date  . Breast reduction surgery    . Nasal endoscopic    . Removal of ovary    . Reduction mammaplasty Bilateral 2000    Family History  Problem Relation Age of Onset  . Diabetes Mother   . Cancer Mother 80    Breast  . Breast cancer Mother  43  . Diabetes Daughter     Oldest Daughter  . Stroke Maternal Uncle   . Breast cancer Maternal Aunt 1    Social History   Social History  . Marital Status: Single    Spouse Name: N/A  . Number of Children: N/A  . Years of Education: N/A   Occupational History  . Not on file.   Social History Main Topics  . Smoking status: Never Smoker   . Smokeless tobacco: Never Used  . Alcohol Use: No  . Drug Use: No  . Sexual Activity: Yes    Birth Control/ Protection: None   Other Topics Concern  . Not on file   Social History Narrative     Current outpatient prescriptions:  .  ALPRAZolam  (ALPRAZOLAM XR) 0.5 MG 24 hr tablet, Take 1 tablet (0.5 mg total) by mouth daily., Disp: 30 tablet, Rfl: 2 .  Ascorbic Acid (VITAMIN C) 100 MG tablet, Take 100 mg by mouth daily., Disp: , Rfl:  .  aspirin 81 MG tablet, Take 81 mg by mouth daily., Disp: , Rfl:  .  baclofen (LIORESAL) 10 MG tablet, TAKE ONE TABLET BY MOUTH TWICE DAILY AS NEEDED, Disp: 30 tablet, Rfl: 2 .  Biotin 10 MG CAPS, Take 10 mg by mouth once., Disp: , Rfl:  .  cholecalciferol (VITAMIN D) 1000 UNITS tablet, Take 1,000 Units by mouth 2 (two) times daily.  , Disp: , Rfl:  .  fluticasone (FLONASE) 50 MCG/ACT nasal spray, Place 2 sprays into both nostrils daily., Disp: 16 g, Rfl: 2 .  glipiZIDE (GLUCOTROL XL) 5 MG 24 hr tablet, Take 3 tablets (15 mg total) by mouth daily with breakfast., Disp: 90 tablet, Rfl: 0 .  glucose blood (ONETOUCH VERIO) test strip, 1 each by Other route 2 (two) times daily. Use as instructed, Disp: 100 each, Rfl: 2 .  GLUCOSE BLOOD VI, , Disp: , Rfl:  .  lisinopril-hydrochlorothiazide (PRINZIDE,ZESTORETIC) 20-12.5 MG tablet, Take 1 tablet by mouth daily., Disp: 90 tablet, Rfl: 1 .  magnesium 30 MG tablet, Take 30 mg by mouth., Disp: , Rfl:  .  metFORMIN (GLUCOPHAGE) 850 MG tablet, Take 1 tablet (850 mg total) by mouth 3 (three) times daily., Disp: 270 tablet, Rfl: 1 .  Multiple Vitamin (MULITIVITAMIN WITH MINERALS) TABS, Take 1 tablet by mouth daily.  , Disp: , Rfl:  .  ONETOUCH DELICA LANCETS FINE MISC, 1 Units by Does not apply route 2 (two) times daily., Disp: 100 each, Rfl: 2 .  Pitavastatin Calcium (LIVALO) 2 MG TABS, Take 1 tablet (2 mg total) by mouth daily., Disp: 30 tablet, Rfl: 2  Allergies  Allergen Reactions  . Atorvastatin     and pravastatin caused muscle cramps, also simvastatin  . Compazine Other (See Comments)    Twitching and spasms of neck muscles  . Penicillins     rash     ROS  Constitutional: Negative for fever, positive for  weight change.  Respiratory: Negative for cough  and shortness of breath.   Cardiovascular: Negative for chest pain or palpitations.  Gastrointestinal: Negative for abdominal pain, no bowel changes.  Musculoskeletal: Negative for gait problem or joint swelling.  Skin: Negative for rash.  Neurological: Negative for dizziness or headache.  No other specific complaints in a complete review of systems (except as listed in HPI above).  Objective  Filed Vitals:   03/27/16 0950  BP: 118/64  Pulse: 97  Temp: 98.6 F (37 C)  TempSrc: Oral  Resp: 14  Height: 5\' 5"  (1.651 m)  Weight: 265 lb 12.8 oz (120.566 kg)  SpO2: 98%    Body mass index is 44.23 kg/(m^2).  Physical Exam  Constitutional: Patient appears well-developed and well-nourished. Obese  No distress.  HEENT: head atraumatic, normocephalic, pupils equal and reactive to light,  neck supple, throat within normal limits Cardiovascular: Normal rate, regular rhythm and normal heart sounds.  No murmur heard. No BLE edema. Pulmonary/Chest: Effort normal and breath sounds normal. No respiratory distress. Abdominal: Soft.  There is no tenderness. Psychiatric: Patient has a normal mood and affect. behavior is normal. Judgment and thought content normal. Muscular Skeletal: pain during exam- palpation of left deltoid bursa,  normal rom of right shoulder   Recent Results (from the past 2160 hour(s))  POCT HgB A1C     Status: Abnormal   Collection Time: 01/02/16  9:21 AM  Result Value Ref Range   Hemoglobin A1C 7.9   POCT UA - Microalbumin     Status: None   Collection Time: 01/02/16  9:21 AM  Result Value Ref Range   Microalbumin Ur, POC 20 mg/L   Creatinine, POC  mg/dL   Albumin/Creatinine Ratio, Urine, POC        PHQ2/9: Depression screen [facility] 2/9 03/27/2016 01/02/2016 09/26/2015 06/19/2015  Decreased Interest 0 0 0 0  Down, Depressed, Hopeless 0 1 0 1  PHQ - 2 Score 0 1 0 1     Fall Risk: Fall Risk  03/27/2016 01/02/2016 09/26/2015 06/19/2015  Falls in the past year? No No No  No     Functional Status Survey: Is the patient deaf or have difficulty hearing?: No Does the patient have difficulty seeing, even when wearing glasses/contacts?: No Does the patient have difficulty concentrating, remembering, or making decisions?: No Does the patient have difficulty walking or climbing stairs?: No Does the patient have difficulty dressing or bathing?: No Does the patient have difficulty doing errands alone such as visiting a doctor's office or shopping?: No    Assessment & Plan  1. 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  Dispense: 60 capsule; Refill: 0 - POCT HgB A1C down to 7.4% - we will continue current regiment, keep up with life style changes and think she can get it to goal.   2. Dyslipidemia  Needs to try one pill daily, instead of half - Pitavastatin Calcium (LIVALO) 2 MG TABS; Take 1 tablet (2 mg total) by mouth daily.  Dispense: 30 tablet; Refill: 2  3. Depression, major, recurrent, mild (HCC)  Add Duloxetine and discussed possible side effects and importance of titrating it up and down  - DULoxetine (CYMBALTA) 30 MG capsule; Take 1-2 capsules (30-60 mg total) by mouth daily.  Dispense: 60 capsule; Refill: 0 - ALPRAZolam (ALPRAZOLAM XR) 0.5 MG 24 hr tablet; Take 1 tablet (0.5 mg total) by mouth daily.  Dispense: 30 tablet; Refill: 2  4. Benign hypertension  At goal   5. Seasonal allergic rhinitis  A little more symptoms, advised to use Flonase daily   6. Left shoulder bursitis  - baclofen (LIORESAL) 10 MG tablet; Take 1 tablet (10 mg total) by mouth 2 (two) times daily as needed.  Dispense: 30 tablet; Refill: 2  7. GAD (generalized anxiety disorder)  - DULoxetine (CYMBALTA) 30 MG capsule; Take 1-2 capsules (30-60 mg total) by mouth daily.  Dispense: 60 capsule;  Refill: 0 - ALPRAZolam (ALPRAZOLAM XR) 0.5 MG 24 hr tablet; Take 1 tablet (0.5 mg total) by mouth daily.  Dispense: 30 tablet; Refill: 2  8. Venous vascular malformations  - Ambulatory referral to Vascular Surgery

## 2016-04-08 ENCOUNTER — Ambulatory Visit: Payer: BC Managed Care – PPO | Admitting: Family Medicine

## 2016-04-25 ENCOUNTER — Telehealth: Payer: Self-pay

## 2016-04-25 NOTE — Telephone Encounter (Signed)
Patient called to ask about starting on Cymbalta since she has not started on it yet, due to looking for a new job. Informed patient to take medication as tolerated as prescribed.

## 2016-05-01 ENCOUNTER — Other Ambulatory Visit: Payer: Self-pay | Admitting: Family Medicine

## 2016-06-04 ENCOUNTER — Other Ambulatory Visit: Payer: Self-pay | Admitting: Family Medicine

## 2016-06-04 NOTE — Telephone Encounter (Signed)
Patient requesting refill. 

## 2016-06-27 ENCOUNTER — Encounter: Payer: Self-pay | Admitting: Family Medicine

## 2016-06-27 ENCOUNTER — Telehealth: Payer: Self-pay

## 2016-06-27 ENCOUNTER — Ambulatory Visit (INDEPENDENT_AMBULATORY_CARE_PROVIDER_SITE_OTHER): Payer: BC Managed Care – PPO | Admitting: Family Medicine

## 2016-06-27 VITALS — BP 128/68 | HR 111 | Temp 97.4°F | Resp 18 | Ht 65.0 in | Wt 270.4 lb

## 2016-06-27 DIAGNOSIS — M7552 Bursitis of left shoulder: Secondary | ICD-10-CM | POA: Diagnosis not present

## 2016-06-27 DIAGNOSIS — Z79899 Other long term (current) drug therapy: Secondary | ICD-10-CM

## 2016-06-27 DIAGNOSIS — F411 Generalized anxiety disorder: Secondary | ICD-10-CM | POA: Diagnosis not present

## 2016-06-27 DIAGNOSIS — F330 Major depressive disorder, recurrent, mild: Secondary | ICD-10-CM

## 2016-06-27 DIAGNOSIS — E559 Vitamin D deficiency, unspecified: Secondary | ICD-10-CM

## 2016-06-27 DIAGNOSIS — G4700 Insomnia, unspecified: Secondary | ICD-10-CM | POA: Diagnosis not present

## 2016-06-27 DIAGNOSIS — IMO0002 Reserved for concepts with insufficient information to code with codable children: Secondary | ICD-10-CM

## 2016-06-27 DIAGNOSIS — E1140 Type 2 diabetes mellitus with diabetic neuropathy, unspecified: Secondary | ICD-10-CM

## 2016-06-27 DIAGNOSIS — E1165 Type 2 diabetes mellitus with hyperglycemia: Secondary | ICD-10-CM | POA: Diagnosis not present

## 2016-06-27 DIAGNOSIS — Z6841 Body Mass Index (BMI) 40.0 and over, adult: Secondary | ICD-10-CM

## 2016-06-27 DIAGNOSIS — J302 Other seasonal allergic rhinitis: Secondary | ICD-10-CM

## 2016-06-27 DIAGNOSIS — E785 Hyperlipidemia, unspecified: Secondary | ICD-10-CM | POA: Diagnosis not present

## 2016-06-27 DIAGNOSIS — I10 Essential (primary) hypertension: Secondary | ICD-10-CM

## 2016-06-27 LAB — POCT GLYCOSYLATED HEMOGLOBIN (HGB A1C): Hemoglobin A1C: 7.6

## 2016-06-27 MED ORDER — LISINOPRIL-HYDROCHLOROTHIAZIDE 20-12.5 MG PO TABS: 1.0000 | ORAL_TABLET | Freq: Every day | ORAL | Status: DC

## 2016-06-27 MED ORDER — ALPRAZOLAM ER 0.5 MG PO TB24: 0.5000 mg | ORAL_TABLET | Freq: Every day | ORAL | Status: DC

## 2016-06-27 MED ORDER — DAPAGLIFLOZIN PRO-METFORMIN ER 5-1000 MG PO TB24: 2.0000 | ORAL_TABLET | Freq: Every day | ORAL | Status: DC

## 2016-06-27 MED ORDER — FLUTICASONE PROPIONATE 50 MCG/ACT NA SUSP: 2.0000 | Freq: Every day | NASAL | Status: DC

## 2016-06-27 MED ORDER — GLIPIZIDE ER 5 MG PO TB24: ORAL_TABLET | ORAL | Status: DC

## 2016-06-27 MED ORDER — BACLOFEN 10 MG PO TABS: 10.0000 mg | ORAL_TABLET | Freq: Two times a day (BID) | ORAL | Status: DC | PRN

## 2016-06-27 MED ORDER — PITAVASTATIN CALCIUM 2 MG PO TABS: 1.0000 | ORAL_TABLET | Freq: Every day | ORAL | Status: DC

## 2016-06-27 NOTE — Telephone Encounter (Signed)
yes

## 2016-06-27 NOTE — Telephone Encounter (Signed)
Patient called stating that Lindsay Mcdonald is out of the Xigduo XR and have to order it so she wanted to know if it is ok to just continue the metformin until then.   She was informed that I do not think it will be a problem but that I will consult with Dr. Ancil Boozer and will call her back.

## 2016-06-27 NOTE — Addendum Note (Signed)
Addended by: Steele Sizer F on: 06/27/2016 10:36 AM   Modules accepted: Orders

## 2016-06-27 NOTE — Progress Notes (Signed)
Name: Lindsay Mcdonald   MRN: [id number]    DOB: 1961/09/20   Date:06/27/2016       Progress Note  Subjective  Chief Complaint  Chief Complaint  Patient presents with  . Medication Refill    3 month F/U  . Diabetes    Does not check sugar at home  . Hypertension  . Hyperlipidemia  . Depression    Patient is still taking Alprazolam but does like the feeling Cymbalta does to her. Took 1 pill a day for 2 weeks and made her feel spaced out.     HPI  DMII uncontrolled with neuropathy: she states pain on her feet pain is not as severe now. She has been compliant with medication over the past six months, she is on 15 mg of Glipizide daily and metformin but hgbA1C is still going up. She continues to eat sweets and is no compliant with diabetic diet or exercise. She states she still has  blurred vision. Denies polydipsia, polyuria or polyphagia. She denies any symptoms of hypoglycemia. Taking aspirin daily, also on ACE, stopped Cymbalta - which could help with neuropathy, she also has   HTN: taking lisinopril/HCTZ without any side effects, bp is at goal, denies chest pain, palpitation, or SOB  Hyperlipidemia; she has increased compliance with Livalo, but still not taking as prescribed, denies myalgia  Vascular Malformation: she states she has a group of vessels on right outer thigh, that she noticed her 47 's, not growing or painful, she had appointment in July but had to be re-scheduled.   Major Depression with anxiety: she took Zoloft in the past, states Cymbalta caused "weird feeling ". She asked me about Latuda in the past but she wants to hold off for now. She has difficulty focusing at work. No recent crying spells. She denies suicidal thoughts or ideation. She states Alprazolam XR 0.5 mg daily and she feels like it is helping with anxiety.   Morbid Obesity: she has gained 5 lbs since last visit,  discussed importance of following a healthy diet and continue to try to lose weight.    Left Shoulder pain: started a 9 months ago, she states symptoms has gotten better, still takes Baclofen for muscle / neck spasms and to keep her relaxed ( explained that it is not the indications for the medication ).  Patient Active Problem List   Diagnosis Date Noted  . Venous vascular malformations 03/27/2016  . Diabetic peripheral neuropathy associated with type 2 diabetes mellitus ([place]) 09/26/2015  . Non compliance w medication regimen 09/26/2015  . Seasonal allergic rhinitis 09/26/2015  . Muscle spasms of neck 06/19/2015  . Insomnia 06/19/2015  . Osteoarthritis, knee 06/17/2015  . Depression, major, recurrent, mild ([place]) 06/17/2015  . Diabetes mellitus type 2, uncontrolled ([place]) 06/17/2015  . Dyslipidemia 06/17/2015  . Benign hypertension 06/17/2015  . Morbid obesity with BMI of 40.0-44.9, adult ([place]) 06/17/2015  . Vitamin D deficiency 06/17/2015    Past Surgical History  Procedure Laterality Date  . Breast reduction surgery    . Nasal endoscopic    . Removal of ovary    . Reduction mammaplasty Bilateral 2000    Family History  Problem Relation Age of Onset  . Diabetes Mother   . Cancer Mother 55    Breast  . Breast cancer Mother 67  . Diabetes Daughter     Oldest Daughter  . Stroke Maternal Uncle   . Breast cancer Maternal Aunt 35    Social History  Social History  . Marital Status: Single    Spouse Name: N/A  . Number of Children: N/A  . Years of Education: N/A   Occupational History  . Not on file.   Social History Main Topics  . Smoking status: Never Smoker   . Smokeless tobacco: Never Used  . Alcohol Use: No  . Drug Use: No  . Sexual Activity: Yes    Birth Control/ Protection: None   Other Topics Concern  . Not on file   Social History Narrative     Current outpatient prescriptions:  .  ALPRAZolam (ALPRAZOLAM XR) 0.5 MG 24 hr tablet, Take 1 tablet (0.5 mg total) by mouth daily., Disp: 30 tablet, Rfl: 2 .  Ascorbic Acid (VITAMIN C) 100  MG tablet, Take 100 mg by mouth daily., Disp: , Rfl:  .  aspirin 81 MG tablet, Take 81 mg by mouth daily., Disp: , Rfl:  .  baclofen (LIORESAL) 10 MG tablet, Take 1 tablet (10 mg total) by mouth 2 (two) times daily as needed., Disp: 30 tablet, Rfl: 2 .  Biotin 10 MG CAPS, Take 10 mg by mouth once., Disp: , Rfl:  .  cholecalciferol (VITAMIN D) 1000 UNITS tablet, Take 1,000 Units by mouth 2 (two) times daily.  , Disp: , Rfl:  .  fluticasone (FLONASE) 50 MCG/ACT nasal spray, Place 2 sprays into both nostrils daily., Disp: 16 g, Rfl: 2 .  glipiZIDE (GLUCOTROL XL) 5 MG 24 hr tablet, TAKE THREE TABLETS BY MOUTH ONCE DAILY WITH BREAKFAST, Disp: 90 tablet, Rfl: 2 .  glucose blood (ONETOUCH VERIO) test strip, 1 each by Other route 2 (two) times daily. Use as instructed, Disp: 100 each, Rfl: 2 .  GLUCOSE BLOOD VI, , Disp: , Rfl:  .  lisinopril-hydrochlorothiazide (PRINZIDE,ZESTORETIC) 20-12.5 MG tablet, Take 1 tablet by mouth daily., Disp: 90 tablet, Rfl: 1 .  magnesium 30 MG tablet, Take 30 mg by mouth., Disp: , Rfl:  .  Multiple Vitamin (MULITIVITAMIN WITH MINERALS) TABS, Take 1 tablet by mouth daily.  , Disp: , Rfl:  .  ONETOUCH DELICA LANCETS FINE MISC, 1 Units by Does not apply route 2 (two) times daily., Disp: 100 each, Rfl: 2 .  Pitavastatin Calcium (LIVALO) 2 MG TABS, Take 1 tablet (2 mg total) by mouth daily., Disp: 30 tablet, Rfl: 2 .  Dapagliflozin-Metformin HCl ER (XIGDUO XR) 04-999 MG TB24, Take 2 tablets by mouth daily., Disp: 60 tablet, Rfl: 2  Allergies  Allergen Reactions  . Atorvastatin     and pravastatin caused muscle cramps, also simvastatin  . Compazine Other (See Comments)    Twitching and spasms of neck muscles  . Penicillins     rash     ROS  Ten systems reviewed and is negative except as mentioned in HPI   Objective  Filed Vitals:   06/27/16 0944  BP: 128/68  Pulse: 111  Temp: 97.4 F (36.3 C)  TempSrc: Oral  Resp: 18  Height: 5\' 5"  (1.651 m)  Weight: 270 lb  6.4 oz (122.653 kg)  SpO2: 96%    Body mass index is 45 kg/(m^2).  Physical Exam  Constitutional: Patient appears well-developed and well-nourished. Obese No distress.  HEENT: head atraumatic, normocephalic, pupils equal and reactive to light,  neck supple, throat within normal limits Cardiovascular: Normal rate, regular rhythm and normal heart sounds.  No murmur heard. No BLE edema. Pulmonary/Chest: Effort normal and breath sounds normal. No respiratory distress. Abdominal: Soft.  There is no tenderness. Psychiatric:  Patient has a normal mood and affect. behavior is normal. Judgment and thought content normal.  Recent Results (from the past 2160 hour(s))  POCT HgB A1C     Status: None   Collection Time: 06/27/16  9:57 AM  Result Value Ref Range   Hemoglobin A1C 7.6     Diabetic Foot Exam: Diabetic Foot Exam - Simple   Simple Foot Form  Diabetic Foot exam was performed with the following findings:  Yes 06/27/2016 10:28 AM  Visual Inspection  No deformities, no ulcerations, no other skin breakdown bilaterally:  Yes  Sensation Testing  Intact to touch and monofilament testing bilaterally:  Yes  Pulse Check  Posterior Tibialis and Dorsalis pulse intact bilaterally:  Yes  Comments       PHQ2/9: Depression screen [facility] 2/9 06/27/2016 03/27/2016 01/02/2016 09/26/2015 06/19/2015  Decreased Interest 0 0 0 0 0  Down, Depressed, Hopeless 0 0 1 0 1  PHQ - 2 Score 0 0 1 0 1    Fall Risk: Fall Risk  06/27/2016 03/27/2016 01/02/2016 09/26/2015 06/19/2015  Falls in the past year? No No No No No      Functional Status Survey: Is the patient deaf or have difficulty hearing?: No Does the patient have difficulty seeing, even when wearing glasses/contacts?: No Does the patient have difficulty concentrating, remembering, or making decisions?: No Does the patient have difficulty walking or climbing stairs?: No Does the patient have difficulty dressing or bathing?: No Does the patient have difficulty  doing errands alone such as visiting a doctor's office or shopping?: No    Assessment & Plan   1. Uncontrolled type 2 diabetes with neuropathy ([place])  Discussed importance of life style modification, and importance of getting hgbA1C at goal , discussed possible side effect of medication  - POCT HgB A1C - Dapagliflozin-Metformin HCl ER (XIGDUO XR) 04-999 MG TB24; Take 2 tablets by mouth daily.  Dispense: 60 tablet; Refill: 2  2. Dyslipidemia  She is only taking half pill daily, advised to take it as prescribed - Pitavastatin Calcium (LIVALO) 2 MG TABS; Take 1 tablet (2 mg total) by mouth daily.  Dispense: 30 tablet; Refill: 2  3. Depression, major, recurrent, mild ([place])  She stopped taking Cymbalta because it " made her feel weird" - can't really described it, explained that we need to treat the cause - ALPRAZolam (ALPRAZOLAM XR) 0.5 MG 24 hr tablet; Take 1 tablet (0.5 mg total) by mouth daily.  Dispense: 30 tablet; Refill: 2  4. Benign hypertension  - lisinopril-hydrochlorothiazide (PRINZIDE,ZESTORETIC) 20-12.5 MG tablet; Take 1 tablet by mouth daily.  Dispense: 90 tablet; Refill: 1  5. GAD (generalized anxiety disorder)  Taking Baclofen to help with muscle spasms, explained that we need to treat the cause of her muscle spasms that is anxiety and depression - but she refuses to add another medication  - ALPRAZolam (ALPRAZOLAM XR) 0.5 MG 24 hr tablet; Take 1 tablet (0.5 mg total) by mouth daily.  Dispense: 30 tablet; Refill: 2  6. Morbid obesity with BMI of 40.0-44.9, adult [facility])  Discussed with the patient the risk posed by an increased BMI. Discussed importance of portion control, calorie counting and at least 150 minutes of physical activity weekly. Avoid sweet beverages and drink more water. Eat at least 6 servings of fruit and vegetables daily   7. Insomnia  Taking Tylenol pm, she does not want to try anything else for it  8. Bursitis/tendonitis, shoulder, left  Explained  baclofen is not for this  problem, but she states it helps with muscles spasms - baclofen (LIORESAL) 10 MG tablet; Take 1 tablet (10 mg total) by mouth 2 (two) times daily as needed.  Dispense: 30 tablet; Refill: 2  9. Seasonal allergic rhinitis  - fluticasone (FLONASE) 50 MCG/ACT nasal spray; Place 2 sprays into both nostrils daily.  Dispense: 16 g; Refill: 2

## 2016-06-28 LAB — LIPID PANEL
Cholesterol: 214 mg/dL — ABNORMAL HIGH (ref 125–200)
HDL: 70 mg/dL (ref >=46)
LDL CALC: 115 mg/dL (ref <130)
TRIGLYCERIDES: 144 mg/dL (ref <150)
Total CHOL/HDL Ratio: 3.1 Ratio (ref <=5.0)
VLDL: 29 mg/dL (ref <30)

## 2016-06-28 LAB — VITAMIN B12: Vitamin B-12: 441 pg/mL (ref 200–1100)

## 2016-06-28 LAB — COMPREHENSIVE METABOLIC PANEL
ALK PHOS: 100 U/L (ref 33–130)
ALT: 41 U/L — AB (ref 6–29)
AST: 28 U/L (ref 10–35)
Albumin: 4 g/dL (ref 3.6–5.1)
BUN: 11 mg/dL (ref 7–25)
CHLORIDE: 101 mmol/L (ref 98–110)
CO2: 25 mmol/L (ref 20–31)
CREATININE: 0.78 mg/dL (ref 0.50–1.05)
Calcium: 9.5 mg/dL (ref 8.6–10.4)
GLUCOSE: 125 mg/dL — AB (ref 65–99)
POTASSIUM: 4.2 mmol/L (ref 3.5–5.3)
SODIUM: 140 mmol/L (ref 135–146)
Total Bilirubin: 0.6 mg/dL (ref 0.2–1.2)
Total Protein: 7.1 g/dL (ref 6.1–8.1)

## 2016-06-28 LAB — VITAMIN D 25 HYDROXY (VIT D DEFICIENCY, FRACTURES): Vit D, 25-Hydroxy: 22 ng/mL — ABNORMAL LOW (ref 30–100)

## 2016-07-07 ENCOUNTER — Telehealth: Payer: Self-pay | Admitting: Family Medicine

## 2016-07-07 NOTE — Telephone Encounter (Signed)
A new prescription was sent in for diabetes but patient would like to know if she could stay with Metformin. She does not like what she read about the medication pertaining to the side effects.

## 2016-07-08 ENCOUNTER — Telehealth: Payer: Self-pay

## 2016-07-08 NOTE — Telephone Encounter (Signed)
Left voicemail for patient

## 2016-07-08 NOTE — Telephone Encounter (Signed)
DM is not controlled with Metformin, she needs to try medication, most of the side effects she is reading on the medication is from the metformin in the medication. Ask her to try until next visit. Thank you

## 2016-07-08 NOTE — Telephone Encounter (Signed)
Patient states she works for a [employer] and just has never seen these side effects with Metformin. Asking you please concern upping Metformin, patient is just worried of side effects of possible of kidney failure with new DM medications. Also informed patient she could be referred to Endocrinology but she wanted me to ask for about increasing Metformin first. Thanks

## 2016-07-08 NOTE — Telephone Encounter (Signed)
Got a fax from [place] stating that this patient is requesting to change from Xigduo XR 5-1000mg  back to Metformin .  Please advise

## 2016-07-10 MED ORDER — METFORMIN HCL ER (MOD) 1000 MG PO TB24: 2000.0000 mg | ORAL_TABLET | Freq: Every day | ORAL | Status: DC

## 2016-07-10 NOTE — Addendum Note (Signed)
Addended by: Steele Sizer F on: 07/10/2016 01:56 PM   Modules accepted: Orders, Medications

## 2016-07-10 NOTE — Addendum Note (Signed)
Addended by: Steele Sizer F on: 07/10/2016 01:54 PM   Modules accepted: Medications

## 2016-07-10 NOTE — Telephone Encounter (Signed)
Changed to 2000 mg of Metformin daily , it is high dose

## 2016-08-08 ENCOUNTER — Encounter: Payer: Self-pay | Admitting: Obstetrics and Gynecology

## 2016-08-08 ENCOUNTER — Ambulatory Visit (INDEPENDENT_AMBULATORY_CARE_PROVIDER_SITE_OTHER): Payer: BC Managed Care – PPO | Admitting: Obstetrics and Gynecology

## 2016-08-08 VITALS — BP 122/74 | HR 75 | Ht 65.0 in | Wt 273.7 lb

## 2016-08-08 DIAGNOSIS — Z01419 Encounter for gynecological examination (general) (routine) without abnormal findings: Secondary | ICD-10-CM | POA: Diagnosis not present

## 2016-08-08 DIAGNOSIS — B9689 Other specified bacterial agents as the cause of diseases classified elsewhere: Secondary | ICD-10-CM

## 2016-08-08 DIAGNOSIS — A499 Bacterial infection, unspecified: Secondary | ICD-10-CM | POA: Diagnosis not present

## 2016-08-08 DIAGNOSIS — N760 Acute vaginitis: Secondary | ICD-10-CM

## 2016-08-08 NOTE — Patient Instructions (Signed)
Preventive Care for Adults, Female A healthy lifestyle and preventive care can promote health and wellness. Preventive health guidelines for women include the following key practices.  A routine yearly physical is a good way to check with your health care provider about your health and preventive screening. It is a chance to share any concerns and updates on your health and to receive a thorough exam.  Visit your dentist for a routine exam and preventive care every 6 months. Brush your teeth twice a day and floss once a day. Good oral hygiene prevents tooth decay and gum disease.  The frequency of eye exams is based on your age, health, family medical history, use of contact lenses, and other factors. Follow your health care provider's recommendations for frequency of eye exams.  Eat a healthy diet. Foods like vegetables, fruits, whole grains, low-fat dairy products, and lean protein foods contain the nutrients you need without too many calories. Decrease your intake of foods high in solid fats, added sugars, and salt. Eat the right amount of calories for you.Get information about a proper diet from your health care provider, if necessary.  Regular physical exercise is one of the most important things you can do for your health. Most adults should get at least 150 minutes of moderate-intensity exercise (any activity that increases your heart rate and causes you to sweat) each week. In addition, most adults need muscle-strengthening exercises on 2 or more days a week.  Maintain a healthy weight. The body mass index (BMI) is a screening tool to identify possible weight problems. It provides an estimate of body fat based on height and weight. Your health care provider can find your BMI and can help you achieve or maintain a healthy weight.For adults 20 years and older:  A BMI below 18.5 is considered underweight.  A BMI of 18.5 to 24.9 is normal.  A BMI of 25 to 29.9 is considered  overweight.  A BMI of 30 and above is considered obese.  Maintain normal blood lipids and cholesterol levels by exercising and minimizing your intake of saturated fat. Eat a balanced diet with plenty of fruit and vegetables. Blood tests for lipids and cholesterol should begin at age 64 and be repeated every 5 years. If your lipid or cholesterol levels are high, you are over 50, or you are at high risk for heart disease, you may need your cholesterol levels checked more frequently.Ongoing high lipid and cholesterol levels should be treated with medicines if diet and exercise are not working.  If you smoke, find out from your health care provider how to quit. If you do not use tobacco, do not start.  Lung cancer screening is recommended for adults aged 52-80 years who are at high risk for developing lung cancer because of a history of smoking. A yearly low-dose CT scan of the lungs is recommended for people who have at least a 30-pack-year history of smoking and are a current smoker or have quit within the past 15 years. A pack year of smoking is smoking an average of 1 pack of cigarettes a day for 1 year (for example: 1 pack a day for 30 years or 2 packs a day for 15 years). Yearly screening should continue until the smoker has stopped smoking for at least 15 years. Yearly screening should be stopped for people who develop a health problem that would prevent them from having lung cancer treatment.  If you are pregnant, do not drink alcohol. If you are  breastfeeding, be very cautious about drinking alcohol. If you are not pregnant and choose to drink alcohol, do not have more than 1 drink per day. One drink is considered to be 12 ounces (355 mL) of beer, 5 ounces (148 mL) of wine, or 1.5 ounces (44 mL) of liquor.  Avoid use of street drugs. Do not share needles with anyone. Ask for help if you need support or instructions about stopping the use of drugs.  High blood pressure causes heart disease and  increases the risk of stroke. Your blood pressure should be checked at least every 1 to 2 years. Ongoing high blood pressure should be treated with medicines if weight loss and exercise do not work.  If you are 25-78 years old, ask your health care provider if you should take aspirin to prevent strokes.  Diabetes screening is done by taking a blood sample to check your blood glucose level after you have not eaten for a certain period of time (fasting). If you are not overweight and you do not have risk factors for diabetes, you should be screened once every 3 years starting at age 86. If you are overweight or obese and you are 3-87 years of age, you should be screened for diabetes every year as part of your cardiovascular risk assessment.  Breast cancer screening is essential preventive care for women. You should practice "breast self-awareness." This means understanding the normal appearance and feel of your breasts and may include breast self-examination. Any changes detected, no matter how small, should be reported to a health care provider. Women in their 66s and 30s should have a clinical breast exam (CBE) by a health care provider as part of a regular health exam every 1 to 3 years. After age 43, women should have a CBE every year. Starting at age 37, women should consider having a mammogram (breast X-ray test) every year. Women who have a family history of breast cancer should talk to their health care provider about genetic screening. Women at a high risk of breast cancer should talk to their health care providers about having an MRI and a mammogram every year.  Breast cancer gene (BRCA)-related cancer risk assessment is recommended for women who have family members with BRCA-related cancers. BRCA-related cancers include breast, ovarian, tubal, and peritoneal cancers. Having family members with these cancers may be associated with an increased risk for harmful changes (mutations) in the breast  cancer genes BRCA1 and BRCA2. Results of the assessment will determine the need for genetic counseling and BRCA1 and BRCA2 testing.  Your health care provider may recommend that you be screened regularly for cancer of the pelvic organs (ovaries, uterus, and vagina). This screening involves a pelvic examination, including checking for microscopic changes to the surface of your cervix (Pap test). You may be encouraged to have this screening done every 3 years, beginning at age 78.  For women ages 79-65, health care providers may recommend pelvic exams and Pap testing every 3 years, or they may recommend the Pap and pelvic exam, combined with testing for human papilloma virus (HPV), every 5 years. Some types of HPV increase your risk of cervical cancer. Testing for HPV may also be done on women of any age with unclear Pap test results.  Other health care providers may not recommend any screening for nonpregnant women who are considered low risk for pelvic cancer and who do not have symptoms. Ask your health care provider if a screening pelvic exam is right for  you.  If you have had past treatment for cervical cancer or a condition that could lead to cancer, you need Pap tests and screening for cancer for at least 20 years after your treatment. If Pap tests have been discontinued, your risk factors (such as having a new sexual partner) need to be reassessed to determine if screening should resume. Some women have medical problems that increase the chance of getting cervical cancer. In these cases, your health care provider may recommend more frequent screening and Pap tests.  Colorectal cancer can be detected and often prevented. Most routine colorectal cancer screening begins at the age of 50 years and continues through age 75 years. However, your health care provider may recommend screening at an earlier age if you have risk factors for colon cancer. On a yearly basis, your health care provider may provide  home test kits to check for hidden blood in the stool. Use of a small camera at the end of a tube, to directly examine the colon (sigmoidoscopy or colonoscopy), can detect the earliest forms of colorectal cancer. Talk to your health care provider about this at age 50, when routine screening begins. Direct exam of the colon should be repeated every 5-10 years through age 75 years, unless early forms of precancerous polyps or small growths are found.  People who are at an increased risk for hepatitis B should be screened for this virus. You are considered at high risk for hepatitis B if:  You were born in a country where hepatitis B occurs often. Talk with your health care provider about which countries are considered high risk.  Your parents were born in a high-risk country and you have not received a shot to protect against hepatitis B (hepatitis B vaccine).  You have HIV or AIDS.  You use needles to inject street drugs.  You live with, or have sex with, someone who has hepatitis B.  You get hemodialysis treatment.  You take certain medicines for conditions like cancer, organ transplantation, and autoimmune conditions.  Hepatitis C blood testing is recommended for all people born from 1945 through 1965 and any individual with known risks for hepatitis C.  Practice safe sex. Use condoms and avoid high-risk sexual practices to reduce the spread of sexually transmitted infections (STIs). STIs include gonorrhea, chlamydia, syphilis, trichomonas, herpes, HPV, and human immunodeficiency virus (HIV). Herpes, HIV, and HPV are viral illnesses that have no cure. They can result in disability, cancer, and death.  You should be screened for sexually transmitted illnesses (STIs) including gonorrhea and chlamydia if:  You are sexually active and are younger than 24 years.  You are older than 24 years and your health care provider tells you that you are at risk for this type of infection.  Your sexual  activity has changed since you were last screened and you are at an increased risk for chlamydia or gonorrhea. Ask your health care provider if you are at risk.  If you are at risk of being infected with HIV, it is recommended that you take a prescription medicine daily to prevent HIV infection. This is called preexposure prophylaxis (PrEP). You are considered at risk if:  You are sexually active and do not regularly use condoms or know the HIV status of your partner(s).  You take drugs by injection.  You are sexually active with a partner who has HIV.  Talk with your health care provider about whether you are at high risk of being infected with HIV. If   you choose to begin PrEP, you should first be tested for HIV. You should then be tested every 3 months for as long as you are taking PrEP.  Osteoporosis is a disease in which the bones lose minerals and strength with aging. This can result in serious bone fractures or breaks. The risk of osteoporosis can be identified using a bone density scan. Women ages 1 years and over and women at risk for fractures or osteoporosis should discuss screening with their health care providers. Ask your health care provider whether you should take a calcium supplement or vitamin D to reduce the rate of osteoporosis.  Menopause can be associated with physical symptoms and risks. Hormone replacement therapy is available to decrease symptoms and risks. You should talk to your health care provider about whether hormone replacement therapy is right for you.  Use sunscreen. Apply sunscreen liberally and repeatedly throughout the day. You should seek shade when your shadow is shorter than you. Protect yourself by wearing long sleeves, pants, a wide-brimmed hat, and sunglasses year round, whenever you are outdoors.  Once a month, do a whole body skin exam, using a mirror to look at the skin on your back. Tell your health care provider of new moles, moles that have irregular  borders, moles that are larger than a pencil eraser, or moles that have changed in shape or color.  Stay current with required vaccines (immunizations).  Influenza vaccine. All adults should be immunized every year.  Tetanus, diphtheria, and acellular pertussis (Td, Tdap) vaccine. Pregnant women should receive 1 dose of Tdap vaccine during each pregnancy. The dose should be obtained regardless of the length of time since the last dose. Immunization is preferred during the 27th-36th week of gestation. An adult who has not previously received Tdap or who does not know her vaccine status should receive 1 dose of Tdap. This initial dose should be followed by tetanus and diphtheria toxoids (Td) booster doses every 10 years. Adults with an unknown or incomplete history of completing a 3-dose immunization series with Td-containing vaccines should begin or complete a primary immunization series including a Tdap dose. Adults should receive a Td booster every 10 years.  Varicella vaccine. An adult without evidence of immunity to varicella should receive 2 doses or a second dose if she has previously received 1 dose. Pregnant females who do not have evidence of immunity should receive the first dose after pregnancy. This first dose should be obtained before leaving the health care facility. The second dose should be obtained 4-8 weeks after the first dose.  Human papillomavirus (HPV) vaccine. Females aged 13-26 years who have not received the vaccine previously should obtain the 3-dose series. The vaccine is not recommended for use in pregnant females. However, pregnancy testing is not needed before receiving a dose. If a female is found to be pregnant after receiving a dose, no treatment is needed. In that case, the remaining doses should be delayed until after the pregnancy. Immunization is recommended for any person with an immunocompromised condition through the age of 24 years if she did not get any or all doses  earlier. During the 3-dose series, the second dose should be obtained 4-8 weeks after the first dose. The third dose should be obtained 24 weeks after the first dose and 16 weeks after the second dose.  Zoster vaccine. One dose is recommended for adults aged 97 years or older unless certain conditions are present.  Measles, mumps, and rubella (MMR) vaccine. Adults born  before 1957 generally are considered immune to measles and mumps. Adults born in [birth year] or later should have 1 or more doses of MMR vaccine unless there is a contraindication to the vaccine or there is laboratory evidence of immunity to each of the three diseases. A routine second dose of MMR vaccine should be obtained at least 28 days after the first dose for students attending postsecondary schools, health care workers, or international travelers. People who received inactivated measles vaccine or an unknown type of measles vaccine during 1963-1967 should receive 2 doses of MMR vaccine. People who received inactivated mumps vaccine or an unknown type of mumps vaccine before 1979 and are at high risk for mumps infection should consider immunization with 2 doses of MMR vaccine. For females of childbearing age, rubella immunity should be determined. If there is no evidence of immunity, females who are not pregnant should be vaccinated. If there is no evidence of immunity, females who are pregnant should delay immunization until after pregnancy. Unvaccinated health care workers born before 60 who lack laboratory evidence of measles, mumps, or rubella immunity or laboratory confirmation of disease should consider measles and mumps immunization with 2 doses of MMR vaccine or rubella immunization with 1 dose of MMR vaccine.  Pneumococcal 13-valent conjugate (PCV13) vaccine. When indicated, a person who is uncertain of his immunization history and has no record of immunization should receive the PCV13 vaccine. All adults 61 years of age and older  should receive this vaccine. An adult aged 92 years or older who has certain medical conditions and has not been previously immunized should receive 1 dose of PCV13 vaccine. This PCV13 should be followed with a dose of pneumococcal polysaccharide (PPSV23) vaccine. Adults who are at high risk for pneumococcal disease should obtain the PPSV23 vaccine at least 8 weeks after the dose of PCV13 vaccine. Adults older than 53 years of age who have normal immune system function should obtain the PPSV23 vaccine dose at least 1 year after the dose of PCV13 vaccine.  Pneumococcal polysaccharide (PPSV23) vaccine. When PCV13 is also indicated, PCV13 should be obtained first. All adults aged 2 years and older should be immunized. An adult younger than age 30 years who has certain medical conditions should be immunized. Any person who resides in a nursing home or long-term care facility should be immunized. An adult smoker should be immunized. People with an immunocompromised condition and certain other conditions should receive both PCV13 and PPSV23 vaccines. People with human immunodeficiency virus (HIV) infection should be immunized as soon as possible after diagnosis. Immunization during chemotherapy or radiation therapy should be avoided. Routine use of PPSV23 vaccine is not recommended for American Indians, [place] Natives, or people younger than 65 years unless there are medical conditions that require PPSV23 vaccine. When indicated, people who have unknown immunization and have no record of immunization should receive PPSV23 vaccine. One-time revaccination 5 years after the first dose of PPSV23 is recommended for people aged 19-64 years who have chronic kidney failure, nephrotic syndrome, asplenia, or immunocompromised conditions. People who received 1-2 doses of PPSV23 before age 44 years should receive another dose of PPSV23 vaccine at age 83 years or later if at least 5 years have passed since the previous dose. Doses  of PPSV23 are not needed for people immunized with PPSV23 at or after age 20 years.  Meningococcal vaccine. Adults with asplenia or persistent complement component deficiencies should receive 2 doses of quadrivalent meningococcal conjugate (MenACWY-D) vaccine. The doses should be obtained  at least 2 months apart. Microbiologists working with certain meningococcal bacteria, [place] recruits, people at risk during an outbreak, and people who travel to or live in countries with a high rate of meningitis should be immunized. A first-year college student up through age 28 years who is living in a residence hall should receive a dose if she did not receive a dose on or after her 16th birthday. Adults who have certain high-risk conditions should receive one or more doses of vaccine.  Hepatitis A vaccine. Adults who wish to be protected from this disease, have certain high-risk conditions, work with hepatitis A-infected animals, work in hepatitis A research labs, or travel to or work in countries with a high rate of hepatitis A should be immunized. Adults who were previously unvaccinated and who anticipate close contact with an international adoptee during the first 60 days after arrival in the [place] from a country with a high rate of hepatitis A should be immunized.  Hepatitis B vaccine. Adults who wish to be protected from this disease, have certain high-risk conditions, may be exposed to blood or other infectious body fluids, are household contacts or sex partners of hepatitis B positive people, are clients or workers in certain care facilities, or travel to or work in countries with a high rate of hepatitis B should be immunized.  Haemophilus influenzae type b (Hib) vaccine. A previously unvaccinated person with asplenia or sickle cell disease or having a scheduled splenectomy should receive 1 dose of Hib vaccine. Regardless of previous immunization, a recipient of a hematopoietic stem cell transplant  should receive a 3-dose series 6-12 months after her successful transplant. Hib vaccine is not recommended for adults with HIV infection. Preventive Services / Frequency Ages 71 to 87 years  Blood pressure check.** / Every 3-5 years.  Lipid and cholesterol check.** / Every 5 years beginning at age 1.  Clinical breast exam.** / Every 3 years for women in their 3s and 31s.  BRCA-related cancer risk assessment.** / For women who have family members with a BRCA-related cancer (breast, ovarian, tubal, or peritoneal cancers).  Pap test.** / Every 2 years from ages 50 through 86. Every 3 years starting at age 87 through age 7 or 75 with a history of 3 consecutive normal Pap tests.  HPV screening.** / Every 3 years from ages 59 through ages 35 to 6 with a history of 3 consecutive normal Pap tests.  Hepatitis C blood test.** / For any individual with known risks for hepatitis C.  Skin self-exam. / Monthly.  Influenza vaccine. / Every year.  Tetanus, diphtheria, and acellular pertussis (Tdap, Td) vaccine.** / Consult your health care provider. Pregnant women should receive 1 dose of Tdap vaccine during each pregnancy. 1 dose of Td every 10 years.  Varicella vaccine.** / Consult your health care provider. Pregnant females who do not have evidence of immunity should receive the first dose after pregnancy.  HPV vaccine. / 3 doses over 6 months, if 72 and younger. The vaccine is not recommended for use in pregnant females. However, pregnancy testing is not needed before receiving a dose.  Measles, mumps, rubella (MMR) vaccine.** / You need at least 1 dose of MMR if you were born in 1957 or later. You may also need a 2nd dose. For females of childbearing age, rubella immunity should be determined. If there is no evidence of immunity, females who are not pregnant should be vaccinated. If there is no evidence of immunity, females who are  pregnant should delay immunization until after  pregnancy.  Pneumococcal 13-valent conjugate (PCV13) vaccine.** / Consult your health care provider.  Pneumococcal polysaccharide (PPSV23) vaccine.** / 1 to 2 doses if you smoke cigarettes or if you have certain conditions.  Meningococcal vaccine.** / 1 dose if you are age 87 to 44 years and a Market researcher living in a residence hall, or have one of several medical conditions, you need to get vaccinated against meningococcal disease. You may also need additional booster doses.  Hepatitis A vaccine.** / Consult your health care provider.  Hepatitis B vaccine.** / Consult your health care provider.  Haemophilus influenzae type b (Hib) vaccine.** / Consult your health care provider. Ages 86 to 38 years  Blood pressure check.** / Every year.  Lipid and cholesterol check.** / Every 5 years beginning at age 49 years.  Lung cancer screening. / Every year if you are aged 71-80 years and have a 30-pack-year history of smoking and currently smoke or have quit within the past 15 years. Yearly screening is stopped once you have quit smoking for at least 15 years or develop a health problem that would prevent you from having lung cancer treatment.  Clinical breast exam.** / Every year after age 51 years.  BRCA-related cancer risk assessment.** / For women who have family members with a BRCA-related cancer (breast, ovarian, tubal, or peritoneal cancers).  Mammogram.** / Every year beginning at age 18 years and continuing for as long as you are in good health. Consult with your health care provider.  Pap test.** / Every 3 years starting at age 63 years through age 37 or 57 years with a history of 3 consecutive normal Pap tests.  HPV screening.** / Every 3 years from ages 41 years through ages 76 to 23 years with a history of 3 consecutive normal Pap tests.  Fecal occult blood test (FOBT) of stool. / Every year beginning at age 36 years and continuing until age 51 years. You may not need  to do this test if you get a colonoscopy every 10 years.  Flexible sigmoidoscopy or colonoscopy.** / Every 5 years for a flexible sigmoidoscopy or every 10 years for a colonoscopy beginning at age 36 years and continuing until age 35 years.  Hepatitis C blood test.** / For all people born from [birth year] through 1965 and any individual with known risks for hepatitis C.  Skin self-exam. / Monthly.  Influenza vaccine. / Every year.  Tetanus, diphtheria, and acellular pertussis (Tdap/Td) vaccine.** / Consult your health care provider. Pregnant women should receive 1 dose of Tdap vaccine during each pregnancy. 1 dose of Td every 10 years.  Varicella vaccine.** / Consult your health care provider. Pregnant females who do not have evidence of immunity should receive the first dose after pregnancy.  Zoster vaccine.** / 1 dose for adults aged 73 years or older.  Measles, mumps, rubella (MMR) vaccine.** / You need at least 1 dose of MMR if you were born in 1957 or later. You may also need a second dose. For females of childbearing age, rubella immunity should be determined. If there is no evidence of immunity, females who are not pregnant should be vaccinated. If there is no evidence of immunity, females who are pregnant should delay immunization until after pregnancy.  Pneumococcal 13-valent conjugate (PCV13) vaccine.** / Consult your health care provider.  Pneumococcal polysaccharide (PPSV23) vaccine.** / 1 to 2 doses if you smoke cigarettes or if you have certain conditions.  Meningococcal vaccine.** /  Consult your health care provider.  Hepatitis A vaccine.** / Consult your health care provider.  Hepatitis B vaccine.** / Consult your health care provider.  Haemophilus influenzae type b (Hib) vaccine.** / Consult your health care provider. Ages 80 years and over  Blood pressure check.** / Every year.  Lipid and cholesterol check.** / Every 5 years beginning at age 62 years.  Lung cancer  screening. / Every year if you are aged 32-80 years and have a 30-pack-year history of smoking and currently smoke or have quit within the past 15 years. Yearly screening is stopped once you have quit smoking for at least 15 years or develop a health problem that would prevent you from having lung cancer treatment.  Clinical breast exam.** / Every year after age 61 years.  BRCA-related cancer risk assessment.** / For women who have family members with a BRCA-related cancer (breast, ovarian, tubal, or peritoneal cancers).  Mammogram.** / Every year beginning at age 39 years and continuing for as long as you are in good health. Consult with your health care provider.  Pap test.** / Every 3 years starting at age 85 years through age 74 or 72 years with 3 consecutive normal Pap tests. Testing can be stopped between 65 and 70 years with 3 consecutive normal Pap tests and no abnormal Pap or HPV tests in the past 10 years.  HPV screening.** / Every 3 years from ages 55 years through ages 67 or 77 years with a history of 3 consecutive normal Pap tests. Testing can be stopped between 65 and 70 years with 3 consecutive normal Pap tests and no abnormal Pap or HPV tests in the past 10 years.  Fecal occult blood test (FOBT) of stool. / Every year beginning at age 81 years and continuing until age 22 years. You may not need to do this test if you get a colonoscopy every 10 years.  Flexible sigmoidoscopy or colonoscopy.** / Every 5 years for a flexible sigmoidoscopy or every 10 years for a colonoscopy beginning at age 67 years and continuing until age 22 years.  Hepatitis C blood test.** / For all people born from [birth year] through 1963 and any individual with known risks for hepatitis C.  Osteoporosis screening.** / A one-time screening for women ages 8 years and over and women at risk for fractures or osteoporosis.  Skin self-exam. / Monthly.  Influenza vaccine. / Every year.  Tetanus, diphtheria, and  acellular pertussis (Tdap/Td) vaccine.** / 1 dose of Td every 10 years.  Varicella vaccine.** / Consult your health care provider.  Zoster vaccine.** / 1 dose for adults aged 56 years or older.  Pneumococcal 13-valent conjugate (PCV13) vaccine.** / Consult your health care provider.  Pneumococcal polysaccharide (PPSV23) vaccine.** / 1 dose for all adults aged 15 years and older.  Meningococcal vaccine.** / Consult your health care provider.  Hepatitis A vaccine.** / Consult your health care provider.  Hepatitis B vaccine.** / Consult your health care provider.  Haemophilus influenzae type b (Hib) vaccine.** / Consult your health care provider. ** Family history and personal history of risk and conditions may change your health care provider's recommendations.   This information is not intended to replace advice given to you by your health care provider. Make sure you discuss any questions you have with your health care provider.   Document Released: 02/03/2002 Document Revised: 12/29/2014 Document Reviewed: 05/05/2011 Elsevier Interactive Patient Education [employer].

## 2016-08-08 NOTE — Progress Notes (Signed)
Subjective:   Lindsay Mcdonald is a 55 y.o. G2P0 African American female here for a routine well-woman exam.  No LMP recorded. Patient is postmenopausal.    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  Past Medical History Past Medical History:  Diagnosis Date  . Anxiety   . Chronic pain of right knee   . Depression   . Diabetes mellitus   . High cholesterol   . Hypertension   . Muscle pain   . Palpitations   . Vitamin D deficiency   . Vitamin D deficiency     Past Surgical History Past Surgical History:  Procedure Laterality Date  . BREAST REDUCTION SURGERY    . nasal endoscopic    . REDUCTION MAMMAPLASTY Bilateral 2000  . removal of ovary      Gynecologic History G2P0  No LMP recorded. Patient is postmenopausal. Contraception: abstinence Last Pap: 2016. Results were: normal Last mammogram: 2017. Results were: normal   Obstetric History OB History  Gravida Para Term Preterm AB Living  2            SAB TAB Ectopic Multiple Live Births               # Outcome Date GA Lbr Len/2nd Weight Sex Delivery Anes PTL Lv  2 Gravida 1994    F Vag-Spont     1 Gravida 1993    F Vag-Spont         Current Medications Current Outpatient Prescriptions on File Prior to Visit  Medication Sig Dispense Refill  . ALPRAZolam (ALPRAZOLAM XR) 0.5 MG 24 hr tablet Take 1 tablet (0.5 mg total) by mouth daily. 30 tablet 2  . Ascorbic Acid (VITAMIN C)  100 MG tablet Take 100 mg by mouth daily.    Marland Kitchen aspirin 81 MG tablet Take 81 mg by mouth daily.    . baclofen (LIORESAL) 10 MG tablet Take 1 tablet (10 mg total) by mouth 2 (two) times daily as needed. 30 tablet 2  . Biotin 10 MG CAPS Take 10 mg by mouth once.    . cholecalciferol (VITAMIN D) 1000 UNITS tablet Take 1,000 Units by mouth 2 (two) times daily.      . fluticasone (FLONASE) 50 MCG/ACT nasal spray Place 2 sprays into both nostrils daily. 16 g 2  . glipiZIDE (GLUCOTROL XL) 5 MG 24 hr tablet TAKE THREE TABLETS BY MOUTH ONCE DAILY WITH BREAKFAST 90 tablet 2  . glucose blood (ONETOUCH VERIO) test strip 1 each by Other route 2 (two) times daily. Use as instructed 100 each 2  . GLUCOSE BLOOD VI     . lisinopril-hydrochlorothiazide (PRINZIDE,ZESTORETIC) 20-12.5 MG tablet Take 1 tablet by mouth daily. 90 tablet 1  . magnesium 30 MG tablet Take 30 mg by mouth.    . metFORMIN (GLUMETZA) 1000 MG (MOD) 24 hr tablet Take 2 tablets (2,000 mg total) by mouth daily with breakfast. 60 tablet 2  . Multiple Vitamin (MULITIVITAMIN WITH MINERALS) TABS Take 1 tablet by mouth daily.      Marland Kitchen  ONETOUCH DELICA LANCETS FINE MISC 1 Units by Does not apply route 2 (two) times daily. 100 each 2  . Pitavastatin Calcium (LIVALO) 2 MG TABS Take 1 tablet (2 mg total) by mouth daily. 30 tablet 2   No current facility-administered medications on file prior to visit.     Review of Systems Patient denies any headaches, blurred vision, shortness of breath, chest pain, abdominal pain, problems with bowel movements, urination, or intercourse.  Objective:  BP 122/74   Pulse 75   Ht 5\' 5"  (1.651 m)   Wt 273 lb 11.2 oz (124.1 kg)   BMI 45.55 kg/m  Physical Exam  General:  Well developed, well nourished, no acute distress. She is alert and oriented x3. Skin:  Warm and dry Neck:  Midline trachea, no thyromegaly or nodules Cardiovascular: Regular rate and rhythm, no murmur heard Lungs:  Effort normal, all lung fields  clear to auscultation bilaterally Breasts:  No dominant palpable mass, retraction, or nipple discharge Abdomen:  Soft, non tender, no hepatosplenomegaly or masses Pelvic:  External genitalia is normal in appearance.  The vagina is normal in appearance. The cervix is bulbous, no CMT.  Thin prep pap is not done . Uterus is felt to be normal size, shape, and contour.  No adnexal masses or tenderness noted. Microscopic wet-mount exam shows negative for pathogens, normal epithelial cells, KOH done, clue cells. Extremities:  No swelling or varicosities noted Psych:  She has a normal mood and affect  Assessment:   Healthy well-woman exam Obesity  BV  Plan:  Hylafem sample given F/U 1 year for AE, or sooner if needed   Esaul Dorwart Rockney Ghee, CNM

## 2016-09-24 ENCOUNTER — Encounter: Payer: Self-pay | Admitting: Family Medicine

## 2016-09-24 ENCOUNTER — Ambulatory Visit (INDEPENDENT_AMBULATORY_CARE_PROVIDER_SITE_OTHER): Payer: BC Managed Care – PPO | Admitting: Family Medicine

## 2016-09-24 VITALS — BP 128/82 | HR 86 | Temp 98.3°F | Resp 16 | Ht 65.0 in | Wt 275.0 lb

## 2016-09-24 DIAGNOSIS — E1165 Type 2 diabetes mellitus with hyperglycemia: Secondary | ICD-10-CM

## 2016-09-24 DIAGNOSIS — Z6841 Body Mass Index (BMI) 40.0 and over, adult: Secondary | ICD-10-CM

## 2016-09-24 DIAGNOSIS — E1140 Type 2 diabetes mellitus with diabetic neuropathy, unspecified: Secondary | ICD-10-CM

## 2016-09-24 DIAGNOSIS — E785 Hyperlipidemia, unspecified: Secondary | ICD-10-CM | POA: Diagnosis not present

## 2016-09-24 DIAGNOSIS — F330 Major depressive disorder, recurrent, mild: Secondary | ICD-10-CM | POA: Diagnosis not present

## 2016-09-24 DIAGNOSIS — F411 Generalized anxiety disorder: Secondary | ICD-10-CM | POA: Diagnosis not present

## 2016-09-24 DIAGNOSIS — I10 Essential (primary) hypertension: Secondary | ICD-10-CM

## 2016-09-24 DIAGNOSIS — IMO0002 Reserved for concepts with insufficient information to code with codable children: Secondary | ICD-10-CM

## 2016-09-24 LAB — POCT GLYCOSYLATED HEMOGLOBIN (HGB A1C): Hemoglobin A1C: 8.2

## 2016-09-24 MED ORDER — ALPRAZOLAM ER 0.5 MG PO TB24: 0.5000 mg | ORAL_TABLET | Freq: Every day | ORAL | 2 refills | Status: DC

## 2016-09-24 MED ORDER — GLIPIZIDE ER 5 MG PO TB24: ORAL_TABLET | ORAL | 2 refills | Status: DC

## 2016-09-24 MED ORDER — METFORMIN HCL ER 500 MG PO TB24: 2000.0000 mg | ORAL_TABLET | Freq: Every day | ORAL | 2 refills | Status: DC

## 2016-09-24 MED ORDER — DAPAGLIFLOZIN PRO-METFORMIN ER 5-1000 MG PO TB24: 2.0000 | ORAL_TABLET | Freq: Every day | ORAL | 2 refills | Status: DC

## 2016-09-24 NOTE — Progress Notes (Signed)
Name: Lindsay Mcdonald   MRN: [id number]    DOB: Oct 28, 1961   Date:09/24/2016       Progress Note  Subjective  Chief Complaint  Chief Complaint  Patient presents with  . Diabetes    HPI  DMII uncontrolled with neuropathy: she states pain on her feet pain is not as severe now. She has been compliant with medication since last visit, except for Livalo.  She  is on 15 mg of Glipizide daily and metformin 850 mg two and half daily, she was afraid of going over 2000 mg daily.  She continues to eat sweets, but less often over the past few months but still compliant with  exercise. She states she still has  blurred vision. Denies polydipsia, polyuria or polyphagia. She denies any symptoms of hypoglycemia. Taking aspirin daily, also on ACE, stopped Cymbalta - which could help with neuropathy.   HTN: taking lisinopril/HCTZ without any side effects, bp is at goal, denies chest pain, palpitation, or SOB  Hyperlipidemia; she has increased compliance with Livalo, but still not taking it daily   Vascular Malformation: she states she has a group of vessels on right outer thigh, that she noticed her 60 's, not growing or painful, she had appointment in July but cancelled because of cost  Major Depression with anxiety: she took Zoloft in the past, states Cymbalta caused "weird feeling ". She asked me about Latuda in the past but she wants to hold off for now. She has difficulty focusing at work. No recent crying spells. She denies suicidal thoughts or ideation. She states Alprazolam XR 0.5 mg daily and she feels like it is helping with anxiety, but still under a lot of stress, advised another SSRI but she refuses at this time.   Morbid Obesity: weight is stable, discussed importance of following a healthy diet and continue to try to lose weight.    Patient Active Problem List   Diagnosis Date Noted  . Venous vascular malformations 03/27/2016  . Diabetic peripheral neuropathy associated with type 2  diabetes mellitus ([place]) 09/26/2015  . Non compliance w medication regimen 09/26/2015  . Seasonal allergic rhinitis 09/26/2015  . Muscle spasms of neck 06/19/2015  . Insomnia 06/19/2015  . Osteoarthritis, knee 06/17/2015  . Depression, major, recurrent, mild ([place]) 06/17/2015  . Diabetes mellitus type 2, uncontrolled ([place]) 06/17/2015  . Dyslipidemia 06/17/2015  . Benign hypertension 06/17/2015  . Morbid obesity with BMI of 40.0-44.9, adult ([place]) 06/17/2015  . Vitamin D deficiency 06/17/2015    Past Surgical History:  Procedure Laterality Date  . BREAST REDUCTION SURGERY    . nasal endoscopic    . REDUCTION MAMMAPLASTY Bilateral 2000  . removal of ovary      Family History  Problem Relation Age of Onset  . Diabetes Mother   . Cancer Mother 33    Breast  . Breast cancer Mother 84  . Diabetes Daughter     Oldest Daughter  . Stroke Maternal Uncle   . Breast cancer Maternal Aunt 57    Social History   Social History  . Marital status: Single    Spouse name: N/A  . Number of children: N/A  . Years of education: N/A   Occupational History  . Not on file.   Social History Main Topics  . Smoking status: Never Smoker  . Smokeless tobacco: Never Used  . Alcohol use No  . Drug use: No  . Sexual activity: Yes    Birth control/ protection: None  Other Topics Concern  . Not on file   Social History Narrative  . No narrative on file     Current Outpatient Prescriptions:  .  ALPRAZolam (ALPRAZOLAM XR) 0.5 MG 24 hr tablet, Take 1 tablet (0.5 mg total) by mouth daily., Disp: 30 tablet, Rfl: 2 .  Ascorbic Acid (VITAMIN C) 100 MG tablet, Take 100 mg by mouth daily., Disp: , Rfl:  .  aspirin 81 MG tablet, Take 81 mg by mouth daily., Disp: , Rfl:  .  baclofen (LIORESAL) 10 MG tablet, Take 1 tablet (10 mg total) by mouth 2 (two) times daily as needed., Disp: 30 tablet, Rfl: 2 .  Biotin 10 MG CAPS, Take 10 mg by mouth once., Disp: , Rfl:  .  cholecalciferol (VITAMIN D) 1000  UNITS tablet, Take 1,000 Units by mouth 2 (two) times daily.  , Disp: , Rfl:  .  fluticasone (FLONASE) 50 MCG/ACT nasal spray, Place 2 sprays into both nostrils daily., Disp: 16 g, Rfl: 2 .  glipiZIDE (GLUCOTROL XL) 5 MG 24 hr tablet, TAKE THREE TABLETS BY MOUTH ONCE DAILY WITH BREAKFAST, Disp: 90 tablet, Rfl: 2 .  glucose blood (ONETOUCH VERIO) test strip, 1 each by Other route 2 (two) times daily. Use as instructed, Disp: 100 each, Rfl: 2 .  GLUCOSE BLOOD VI, , Disp: , Rfl:  .  lisinopril-hydrochlorothiazide (PRINZIDE,ZESTORETIC) 20-12.5 MG tablet, Take 1 tablet by mouth daily., Disp: 90 tablet, Rfl: 1 .  magnesium 30 MG tablet, Take 30 mg by mouth., Disp: , Rfl:  .  Multiple Vitamin (MULITIVITAMIN WITH MINERALS) TABS, Take 1 tablet by mouth daily.  , Disp: , Rfl:  .  ONETOUCH DELICA LANCETS FINE MISC, 1 Units by Does not apply route 2 (two) times daily., Disp: 100 each, Rfl: 2 .  Pitavastatin Calcium (LIVALO) 2 MG TABS, Take 1 tablet (2 mg total) by mouth daily., Disp: 30 tablet, Rfl: 2  Allergies  Allergen Reactions  . Atorvastatin     and pravastatin caused muscle cramps, also simvastatin  . Compazine Other (See Comments)    Twitching and spasms of neck muscles  . Penicillins     rash     ROS  Constitutional: Negative for fever or weight change.  Respiratory: Negative for cough and shortness of breath.   Cardiovascular: Negative for chest pain or palpitations.  Gastrointestinal: Negative for abdominal pain, no bowel changes.  Musculoskeletal: Negative for gait problem or joint swelling.  Skin: Negative for rash.  Neurological: Negative for dizziness or headache.  No other specific complaints in a complete review of systems (except as listed in HPI above).  Objective  Vitals:   09/24/16 1430  BP: 128/82  Pulse: 86  Resp: 16  Temp: 98.3 F (36.8 C)  SpO2: 97%  Weight: 275 lb (124.7 kg)  Height: 5\' 5"  (1.651 m)    Body mass index is 45.76 kg/m.  Physical  Exam  Constitutional: Patient appears well-developed and well-nourished. Obese  No distress.  HEENT: head atraumatic, normocephalic, pupils equal and reactive to light,  neck supple, throat within normal limits Cardiovascular: Normal rate, regular rhythm and normal heart sounds.  No murmur heard. No BLE edema. Pulmonary/Chest: Effort normal and breath sounds normal. No respiratory distress. Abdominal: Soft.  There is no tenderness. Psychiatric: Patient has a normal mood and affect. behavior is normal. Judgment and thought content normal.  Recent Results (from the past 2160 hour(s))  POCT HgB A1C     Status: None   Collection Time:  06/27/16  9:57 AM  Result Value Ref Range   Hemoglobin A1C 7.6   Lipid panel     Status: Abnormal   Collection Time: 06/27/16 11:01 AM  Result Value Ref Range   Cholesterol 214 (H) 125 - 200 mg/dL   Triglycerides 144 <150 mg/dL   HDL 70 >=46 mg/dL   Total CHOL/HDL Ratio 3.1 <=5.0 Ratio   VLDL 29 <30 mg/dL   LDL Cholesterol 115 <130 mg/dL    Comment:   Total Cholesterol/HDL Ratio:CHD Risk                        Coronary Heart Disease Risk Table                                        Men       Women          1/2 Average Risk              3.4        3.3              Average Risk              5.0        4.4           2X Average Risk              9.6        7.1           3X Average Risk             23.4       11.0 Use the calculated Patient Ratio above and the CHD Risk table  to determine the patient's CHD Risk.   Comprehensive metabolic panel     Status: Abnormal   Collection Time: 06/27/16 11:01 AM  Result Value Ref Range   Sodium 140 135 - 146 mmol/L   Potassium 4.2 3.5 - 5.3 mmol/L   Chloride 101 98 - 110 mmol/L   CO2 25 20 - 31 mmol/L   Glucose, Bld 125 (H) 65 - 99 mg/dL   BUN 11 7 - 25 mg/dL   Creat 0.78 0.50 - 1.05 mg/dL    Comment:   For patients > or = 53 years of age: The upper reference limit for Creatinine is approximately 13% higher for  people identified as African-American.      Total Bilirubin 0.6 0.2 - 1.2 mg/dL   Alkaline Phosphatase 100 33 - 130 U/L   AST 28 10 - 35 U/L   ALT 41 (H) 6 - 29 U/L   Total Protein 7.1 6.1 - 8.1 g/dL   Albumin 4.0 3.6 - 5.1 g/dL   Calcium 9.5 8.6 - 10.4 mg/dL  VITAMIN D 25 Hydroxy (Vit-D Deficiency, Fractures)     Status: Abnormal   Collection Time: 06/27/16 11:01 AM  Result Value Ref Range   Vit D, 25-Hydroxy 22 (L) 30 - 100 ng/mL    Comment: Vitamin D Status           25-OH Vitamin D        Deficiency                <20 ng/mL        Insufficiency         20 - 29 ng/mL  Optimal             > or = 30 ng/mL   For 25-OH Vitamin D testing on patients on D2-supplementation and patients for whom quantitation of D2 and D3 fractions is required, the QuestAssureD 25-OH VIT D, (D2,D3), LC/MS/MS is recommended: order code [phone] (patients > 2 yrs).   Vitamin B12     Status: None   Collection Time: 06/27/16 11:01 AM  Result Value Ref Range   Vitamin B-12 441 200 - 1,100 pg/mL     PHQ2/9: Depression screen [facility] 2/9 09/24/2016 06/27/2016 03/27/2016 01/02/2016 09/26/2015  Decreased Interest 0 0 0 0 0  Down, Depressed, Hopeless 0 0 0 1 0  PHQ - 2 Score 0 0 0 1 0     Fall Risk: Fall Risk  09/24/2016 06/27/2016 03/27/2016 01/02/2016 09/26/2015  Falls in the past year? No No No No No     Functional Status Survey: Is the patient deaf or have difficulty hearing?: No Does the patient have difficulty seeing, even when wearing glasses/contacts?: No Does the patient have difficulty concentrating, remembering, or making decisions?: No Does the patient have difficulty walking or climbing stairs?: No Does the patient have difficulty dressing or bathing?: No Does the patient have difficulty doing errands alone such as visiting a doctor's office or shopping?: No    Assessment & Plan  1. Uncontrolled type 2 diabetes with neuropathy ([place])  hgbA1C is going up, explained importance of compliance  with diet and exercise, we will try Xigduo, discussed possible side effects - glipiZIDE (GLUCOTROL XL) 5 MG 24 hr tablet; TAKE THREE TABLETS BY MOUTH ONCE DAILY WITH BREAKFAST  Dispense: 90 tablet; Refill: 2 - POCT HgB A1C - Dapagliflozin-Metformin HCl ER (XIGDUO XR) 04-999 MG TB24; Take 2 tablets by mouth daily.  Dispense: 60 tablet; Refill: 2  2. Dyslipidemia  Needs to take Livalo daily   3. Depression, major, recurrent, mild ([facility])  Refuses SSRI  - ALPRAZolam (ALPRAZOLAM XR) 0.5 MG 24 hr tablet; Take 1 tablet (0.5 mg total) by mouth daily.  Dispense: 30 tablet; Refill: 2  4. Benign hypertension  At goal   5. GAD (generalized anxiety disorder)  - ALPRAZolam (ALPRAZOLAM XR) 0.5 MG 24 hr tablet; Take 1 tablet (0.5 mg total) by mouth daily.  Dispense: 30 tablet; Refill: 2  6. Morbid obesity with BMI of 40.0-44.9, adult [facility])  Discussed with the patient the risk posed by an increased BMI. Discussed importance of portion control, calorie counting and at least 150 minutes of physical activity weekly. Avoid sweet beverages and drink more water. Eat at least 6 servings of fruit and vegetables daily

## 2016-09-26 ENCOUNTER — Ambulatory Visit: Payer: BC Managed Care – PPO | Admitting: Family Medicine

## 2016-09-29 ENCOUNTER — Ambulatory Visit: Payer: BC Managed Care – PPO | Admitting: Family Medicine

## 2016-09-29 ENCOUNTER — Telehealth: Payer: Self-pay | Admitting: Family Medicine

## 2016-09-29 NOTE — Telephone Encounter (Signed)
Pt would like a call back. She states she called on Friday but Dr Ancil Boozer was not here.

## 2016-09-30 ENCOUNTER — Ambulatory Visit: Payer: BC Managed Care – PPO | Admitting: Family Medicine

## 2016-10-01 ENCOUNTER — Other Ambulatory Visit: Payer: Self-pay | Admitting: Family Medicine

## 2016-10-01 ENCOUNTER — Telehealth: Payer: Self-pay | Admitting: Family Medicine

## 2016-10-01 DIAGNOSIS — E1142 Type 2 diabetes mellitus with diabetic polyneuropathy: Secondary | ICD-10-CM

## 2016-10-01 NOTE — Telephone Encounter (Signed)
Patient would like to go to Dr. [facility] In [place]. Please refer instead of taking new medication.

## 2016-10-01 NOTE — Telephone Encounter (Signed)
Referral has been sent for them to review. They will let us know about the appt date and time so we could inform the patient.

## 2016-10-01 NOTE — Telephone Encounter (Signed)
Asked about referral to Dr Alverda Skeans. She would like to cancel this request and switch it to Dr Gabriel Carina. Baker Janus deeply apologizes.

## 2016-12-30 ENCOUNTER — Ambulatory Visit (INDEPENDENT_AMBULATORY_CARE_PROVIDER_SITE_OTHER): Payer: BC Managed Care – PPO | Admitting: Family Medicine

## 2016-12-30 ENCOUNTER — Encounter: Payer: Self-pay | Admitting: Family Medicine

## 2016-12-30 VITALS — BP 132/76 | HR 88 | Temp 98.1°F | Resp 16 | Ht 65.0 in | Wt 274.6 lb

## 2016-12-30 DIAGNOSIS — E1165 Type 2 diabetes mellitus with hyperglycemia: Secondary | ICD-10-CM

## 2016-12-30 DIAGNOSIS — F330 Major depressive disorder, recurrent, mild: Secondary | ICD-10-CM

## 2016-12-30 DIAGNOSIS — I10 Essential (primary) hypertension: Secondary | ICD-10-CM | POA: Diagnosis not present

## 2016-12-30 DIAGNOSIS — Z6841 Body Mass Index (BMI) 40.0 and over, adult: Secondary | ICD-10-CM | POA: Diagnosis not present

## 2016-12-30 DIAGNOSIS — F411 Generalized anxiety disorder: Secondary | ICD-10-CM

## 2016-12-30 DIAGNOSIS — E785 Hyperlipidemia, unspecified: Secondary | ICD-10-CM

## 2016-12-30 DIAGNOSIS — IMO0002 Reserved for concepts with insufficient information to code with codable children: Secondary | ICD-10-CM

## 2016-12-30 DIAGNOSIS — E1140 Type 2 diabetes mellitus with diabetic neuropathy, unspecified: Secondary | ICD-10-CM | POA: Diagnosis not present

## 2016-12-30 LAB — POCT GLYCOSYLATED HEMOGLOBIN (HGB A1C): HEMOGLOBIN A1C: 8.6

## 2016-12-30 LAB — POCT UA - MICROALBUMIN: Microalbumin Ur, POC: 20 mg/L

## 2016-12-30 MED ORDER — METFORMIN HCL ER 500 MG PO TB24: 1000.0000 mg | ORAL_TABLET | Freq: Every day | ORAL | 2 refills | Status: DC

## 2016-12-30 MED ORDER — LISINOPRIL-HYDROCHLOROTHIAZIDE 20-12.5 MG PO TABS: 1.0000 | ORAL_TABLET | Freq: Every day | ORAL | 1 refills | Status: DC

## 2016-12-30 MED ORDER — PITAVASTATIN CALCIUM 2 MG PO TABS: 1.0000 | ORAL_TABLET | Freq: Every day | ORAL | 2 refills | Status: DC

## 2016-12-30 MED ORDER — ALPRAZOLAM ER 0.5 MG PO TB24: 0.5000 mg | ORAL_TABLET | Freq: Every day | ORAL | 2 refills | Status: DC

## 2016-12-30 MED ORDER — DULAGLUTIDE 1.5 MG/0.5ML ~~LOC~~ SOAJ: 1.5000 mg | SUBCUTANEOUS | 2 refills | Status: DC

## 2016-12-30 NOTE — Progress Notes (Signed)
Name: Lindsay Mcdonald   MRN: [id number]    DOB: 02-19-1961   Date:12/30/2016       Progress Note  Subjective  Chief Complaint  Chief Complaint  Patient presents with  . Medication Refill    3 month F/U  . Diabetes    Patient was referred to Dr. Gabriel Carina but did not schedule due to financials. Patient does not check sugar at home  . Hypertension    Denies any symptoms, but states she has ate a good amount of salt due to the holidays.  . Hyperlipidemia    Has been out of medication for couple of weeks, and takes 1/2 due to preventation of muscle cramps   . Allergic Rhinitis     Stable    HPI  DMII uncontrolled with neuropathy: she states pain on her feet pain is not as severe now. She has been compliant with Metformin and Glipizide but did not keep follow up with Dr. Gabriel Carina because it is more expensive to see sub-specialist.  She has decreased dessert intake. She states she still has blurred vision, once over the past few weeks. She had an eye exam, but did not have her eyes dilated. Denies polydipsia, polyuria or polyphagia. She denies any symptoms of hypoglycemia. Taking aspirin daily, also on ACE, stopped Cymbalta - which could help with neuropathy. hgbA1C is even higher at 8.6% explained long term risk of uncontrolled glucose again to the patient.   HTN: taking lisinopril/HCTZ without any side effects, bp is at goal, denies chest pain, palpitation, or SOB  Hyperlipidemia; she has increased compliance with Livalo, out of medication for the past two weeks and she was only taking half pill every 2 weeks  Vascular Malformation: she states she has a group of vessels on right outer thigh, that she noticed her 4 's, not growing or painful, she had appointment in July but cancelled because of cost  Major Depression with anxiety: she took Zoloft in the past, states Cymbalta caused "weird feeling ". She asked me about Latuda in the past but she wants to hold off for now. She has difficulty  focusing at work. No recent crying spells. She denies suicidal thoughts or ideation. She states Alprazolam XR 0.5 mg daily and she feels like it is helping with anxiety, but still under a lot of stress, advised another SSRI but she refuses at this time.   Morbid Obesity: weight is stable, discussed importance of following a healthy diet and continue to try to lose weight.    Patient Active Problem List   Diagnosis Date Noted  . Venous vascular malformations 03/27/2016  . Diabetic peripheral neuropathy associated with type 2 diabetes mellitus ([place]) 09/26/2015  . Non compliance w medication regimen 09/26/2015  . Seasonal allergic rhinitis 09/26/2015  . Muscle spasms of neck 06/19/2015  . Insomnia 06/19/2015  . Osteoarthritis, knee 06/17/2015  . Depression, major, recurrent, mild ([place]) 06/17/2015  . Diabetes mellitus type 2, uncontrolled ([place]) 06/17/2015  . Dyslipidemia 06/17/2015  . Benign hypertension 06/17/2015  . Morbid obesity with BMI of 40.0-44.9, adult ([place]) 06/17/2015  . Vitamin D deficiency 06/17/2015    Past Surgical History:  Procedure Laterality Date  . BREAST REDUCTION SURGERY    . nasal endoscopic    . REDUCTION MAMMAPLASTY Bilateral 2000  . removal of ovary      Family History  Problem Relation Age of Onset  . Diabetes Mother   . Cancer Mother 85    Breast  . Breast cancer  Mother 67  . Diabetes Daughter     Oldest Daughter  . Stroke Maternal Uncle   . Breast cancer Maternal Aunt 27    Social History   Social History  . Marital status: Single    Spouse name: N/A  . Number of children: N/A  . Years of education: N/A   Occupational History  . Not on file.   Social History Main Topics  . Smoking status: Never Smoker  . Smokeless tobacco: Never Used  . Alcohol use No  . Drug use: No  . Sexual activity: Yes    Birth control/ protection: None   Other Topics Concern  . Not on file   Social History Narrative  . No narrative on file     Current  Outpatient Prescriptions:  .  ALPRAZolam (ALPRAZOLAM XR) 0.5 MG 24 hr tablet, Take 1 tablet (0.5 mg total) by mouth daily., Disp: 30 tablet, Rfl: 2 .  Ascorbic Acid (VITAMIN C) 100 MG tablet, Take 100 mg by mouth daily., Disp: , Rfl:  .  aspirin 81 MG tablet, Take 81 mg by mouth daily., Disp: , Rfl:  .  baclofen (LIORESAL) 10 MG tablet, Take 1 tablet (10 mg total) by mouth 2 (two) times daily as needed., Disp: 30 tablet, Rfl: 2 .  Biotin 10 MG CAPS, Take 10 mg by mouth once., Disp: , Rfl:  .  cholecalciferol (VITAMIN D) 1000 UNITS tablet, Take 1,000 Units by mouth 2 (two) times daily.  , Disp: , Rfl:  .  Diphenhydramine-APAP, sleep, (TYLENOL PM EXTRA STRENGTH PO), Take by mouth., Disp: , Rfl:  .  fluticasone (FLONASE) 50 MCG/ACT nasal spray, Place 2 sprays into both nostrils daily., Disp: 16 g, Rfl: 2 .  glucose blood (ONETOUCH VERIO) test strip, 1 each by Other route 2 (two) times daily. Use as instructed, Disp: 100 each, Rfl: 2 .  GLUCOSE BLOOD VI, , Disp: , Rfl:  .  lisinopril-hydrochlorothiazide (PRINZIDE,ZESTORETIC) 20-12.5 MG tablet, Take 1 tablet by mouth daily., Disp: 90 tablet, Rfl: 1 .  magnesium 30 MG tablet, Take 30 mg by mouth., Disp: , Rfl:  .  Multiple Vitamin (MULITIVITAMIN WITH MINERALS) TABS, Take 1 tablet by mouth daily.  , Disp: , Rfl:  .  ONETOUCH DELICA LANCETS FINE MISC, 1 Units by Does not apply route 2 (two) times daily., Disp: 100 each, Rfl: 2 .  Pitavastatin Calcium (LIVALO) 2 MG TABS, Take 1 tablet (2 mg total) by mouth daily., Disp: 30 tablet, Rfl: 2 .  Dulaglutide (TRULICITY) 1.5 0000000 SOPN, Inject 1.5 mg into the skin once a week., Disp: 4 pen, Rfl: 2 .  metFORMIN (GLUCOPHAGE-XR) 500 MG 24 hr tablet, Take 2 tablets (1,000 mg total) by mouth daily with breakfast., Disp: 60 tablet, Rfl: 2  Allergies  Allergen Reactions  . Atorvastatin     and pravastatin caused muscle cramps, also simvastatin  . Compazine Other (See Comments)    Twitching and spasms of neck  muscles  . Penicillins     rash     ROS  Constitutional: Negative for fever or weight change.  Respiratory: Negative for cough and shortness of breath.   Cardiovascular: Negative for chest pain or palpitations.  Gastrointestinal: Negative for abdominal pain, no bowel changes.  Musculoskeletal: Negative for gait problem or joint swelling.  Skin: Negative for rash.  Neurological: Negative for dizziness or headache.  No other specific complaints in a complete review of systems (except as listed in HPI above).  Objective  Vitals:  12/30/16 0955  BP: 132/76  Pulse: 88  Resp: 16  Temp: 98.1 F (36.7 C)  TempSrc: Oral  SpO2: 97%  Weight: 274 lb 9.6 oz (124.6 kg)  Height: 5\' 5"  (1.651 m)    Body mass index is 45.7 kg/m.  Physical Exam  Constitutional: Patient appears well-developed and well-nourished. Obese  No distress.  HEENT: head atraumatic, normocephalic, pupils equal and reactive to light, neck supple, throat within normal limits Cardiovascular: Normal rate, regular rhythm and normal heart sounds.  No murmur heard. No BLE edema. Pulmonary/Chest: Effort normal and breath sounds normal. No respiratory distress. Abdominal: Soft.  There is no tenderness. Psychiatric: Patient has a normal mood and affect. behavior is normal. Judgment and thought content normal.  Recent Results (from the past 2160 hour(s))  POCT HgB A1C     Status: Abnormal   Collection Time: 12/30/16  9:59 AM  Result Value Ref Range   Hemoglobin A1C 8.6   POCT UA - Microalbumin     Status: None   Collection Time: 12/30/16 10:00 AM  Result Value Ref Range   Microalbumin Ur, POC 20 mg/L   Creatinine, POC  mg/dL   Albumin/Creatinine Ratio, Urine, POC       PHQ2/9: Depression screen [facility] 2/9 09/24/2016 06/27/2016 03/27/2016 01/02/2016 09/26/2015  Decreased Interest 0 0 0 0 0  Down, Depressed, Hopeless 0 0 0 1 0  PHQ - 2 Score 0 0 0 1 0    Fall Risk: Fall Risk  12/30/2016 09/24/2016 06/27/2016 03/27/2016  01/02/2016  Falls in the past year? No No No No No     Functional Status Survey: Is the patient deaf or have difficulty hearing?: No Does the patient have difficulty seeing, even when wearing glasses/contacts?: No Does the patient have difficulty concentrating, remembering, or making decisions?: No Does the patient have difficulty walking or climbing stairs?: No Does the patient have difficulty dressing or bathing?: No Does the patient have difficulty doing errands alone such as visiting a doctor's office or shopping?: No    Assessment & Plan  1. DM type 2, uncontrolled, with neuropathy ([place])  She cancelled appointment with Dr. Gabriel Carina because of cost. Explained that we must try something different and that I will no longer be able to take care of her if glucose does not get done - POCT HgB A1C - POCT UA - Microalbumin - metFORMIN (GLUCOPHAGE-XR) 500 MG 24 hr tablet; Take 2 tablets (1,000 mg total) by mouth daily with breakfast.  Dispense: 60 tablet; Refill: 2 - Dulaglutide (TRULICITY) 1.5 0000000 SOPN; Inject 1.5 mg into the skin once a week.  Dispense: 4 pen; Refill: 2  2. Benign hypertension  - lisinopril-hydrochlorothiazide (PRINZIDE,ZESTORETIC) 20-12.5 MG tablet; Take 1 tablet by mouth daily.  Dispense: 90 tablet; Refill: 1  3. Dyslipidemia  - Pitavastatin Calcium (LIVALO) 2 MG TABS; Take 1 tablet (2 mg total) by mouth daily.  Dispense: 30 tablet; Refill: 2  4. Morbid obesity with BMI of 40.0-44.9, adult [facility])  Discussed with the patient the risk posed by an increased BMI. Discussed importance of portion control, calorie counting and at least 150 minutes of physical activity weekly. Avoid sweet beverages and drink more water. Eat at least 6 servings of fruit and vegetables daily   5. Depression, major, recurrent, mild ([facility])  - ALPRAZolam (ALPRAZOLAM XR) 0.5 MG 24 hr tablet; Take 1 tablet (0.5 mg total) by mouth daily.  Dispense: 30 tablet; Refill: 2  6. GAD (generalized  anxiety disorder)  - ALPRAZolam (  ALPRAZOLAM XR) 0.5 MG 24 hr tablet; Take 1 tablet (0.5 mg total) by mouth daily.  Dispense: 30 tablet; Refill: 2

## 2016-12-31 ENCOUNTER — Other Ambulatory Visit: Payer: Self-pay | Admitting: Family Medicine

## 2016-12-31 DIAGNOSIS — M7552 Bursitis of left shoulder: Principal | ICD-10-CM

## 2016-12-31 DIAGNOSIS — M719 Bursopathy, unspecified: Secondary | ICD-10-CM

## 2016-12-31 DIAGNOSIS — M67912 Unspecified disorder of synovium and tendon, left shoulder: Secondary | ICD-10-CM

## 2016-12-31 NOTE — Telephone Encounter (Signed)
Patient requesting refill of Baclofen to [facility].

## 2017-01-14 ENCOUNTER — Telehealth: Payer: Self-pay

## 2017-01-14 NOTE — Telephone Encounter (Signed)
Patient states she has seen a Rheumatologist a long time ago and takes Ibuprofen as needed for joint pain. Patient wanted to know if Dr. Ancil Boozer could prescribe her the topical gel for arthritis. It is called Voltaren Gel and wonder if she could prescribe this for her. Could patient try this to see if it would help with her arthritis. [phone]

## 2017-01-15 ENCOUNTER — Other Ambulatory Visit: Payer: Self-pay | Admitting: Family Medicine

## 2017-01-15 MED ORDER — DICLOFENAC SODIUM 1 % TD GEL: 2.0000 g | Freq: Four times a day (QID) | TRANSDERMAL | 2 refills | Status: DC

## 2017-01-15 NOTE — Telephone Encounter (Signed)
Patient notified

## 2017-01-15 NOTE — Telephone Encounter (Signed)
Sent voltaren to her pharmacy

## 2017-01-24 ENCOUNTER — Emergency Department
Admission: EM | Admit: 2017-01-24 | Discharge: 2017-01-24 | Disposition: A | Discharge status: Home / Self Care | Payer: BC Managed Care – PPO | Attending: Emergency Medicine | Admitting: Emergency Medicine

## 2017-01-24 DIAGNOSIS — Z7982 Long term (current) use of aspirin: Secondary | ICD-10-CM | POA: Insufficient documentation

## 2017-01-24 DIAGNOSIS — J069 Acute upper respiratory infection, unspecified: Secondary | ICD-10-CM | POA: Insufficient documentation

## 2017-01-24 DIAGNOSIS — I10 Essential (primary) hypertension: Secondary | ICD-10-CM | POA: Diagnosis not present

## 2017-01-24 DIAGNOSIS — E119 Type 2 diabetes mellitus without complications: Secondary | ICD-10-CM | POA: Diagnosis not present

## 2017-01-24 DIAGNOSIS — R05 Cough: Secondary | ICD-10-CM | POA: Diagnosis not present

## 2017-01-24 DIAGNOSIS — R0981 Nasal congestion: Secondary | ICD-10-CM | POA: Diagnosis present

## 2017-01-24 DIAGNOSIS — Z7984 Long term (current) use of oral hypoglycemic drugs: Secondary | ICD-10-CM | POA: Insufficient documentation

## 2017-01-24 DIAGNOSIS — B9789 Other viral agents as the cause of diseases classified elsewhere: Secondary | ICD-10-CM

## 2017-01-24 LAB — INFLUENZA PANEL BY PCR (TYPE A & B)
Influenza A By PCR: NEGATIVE
Influenza B By PCR: NEGATIVE

## 2017-01-24 MED ORDER — FLUTICASONE PROPIONATE 50 MCG/ACT NA SUSP
2.0000 | Freq: Every day | NASAL | 0 refills | Status: DC
Start: 2017-01-24 — End: 2017-08-03

## 2017-01-24 MED ORDER — PROMETHAZINE-DM 6.25-15 MG/5ML PO SYRP: 5.0000 mL | ORAL_SOLUTION | Freq: Four times a day (QID) | ORAL | 0 refills | Status: DC | PRN

## 2017-01-24 NOTE — ED Notes (Signed)

## 2017-01-24 NOTE — ED Provider Notes (Signed)
[facility] Emergency Department Provider Note  ____________________________________________  Time seen: Approximately 7:32 AM  I have reviewed the triage vital signs and the nursing notes.   HISTORY  Chief Complaint Nasal Congestion    HPI Lindsay Mcdonald is a 56 y.o. female , NAD, presents to the emergency department for evaluation of nasal congestion, left ear pain and nausea. 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 Began to have nasal congestion and left ear pressure last night. Has also had nausea without vomiting. Has had a mild dry cough in which increases the nausea and she has dry heaves. Denies any fevers, chills or body aches. Has had no chest pain, shortness breath, wheezing, abdominal pain, diarrhea. Denies sinus pressure, headaches, sore throat. Takes ibuprofen nightly as a routine and took a decongestant tablet this morning but otherwise is not treated her symptoms.   Past Medical History:  Diagnosis Date  . Anxiety   . Chronic pain of right knee   . Depression   . Diabetes mellitus   . High cholesterol   . Hypertension   . Muscle pain   . Palpitations   . Vitamin D deficiency   . Vitamin D deficiency     Patient Active Problem List   Diagnosis Date Noted  . Venous vascular malformations 03/27/2016  . Diabetic peripheral neuropathy associated with type 2 diabetes mellitus ([place]) 09/26/2015  . Non compliance w medication regimen 09/26/2015  . Seasonal allergic rhinitis 09/26/2015  . Muscle spasms of neck 06/19/2015  . Insomnia 06/19/2015  . Osteoarthritis, knee 06/17/2015  . Depression, major, recurrent, mild ([place]) 06/17/2015  . Diabetes mellitus type 2, uncontrolled ([place]) 06/17/2015  . Dyslipidemia 06/17/2015  . Benign hypertension 06/17/2015  . Morbid obesity with BMI of 40.0-44.9, adult ([place]) 06/17/2015  . Vitamin D deficiency 06/17/2015    Past Surgical History:   Procedure Laterality Date  . BREAST REDUCTION SURGERY    . nasal endoscopic    . REDUCTION MAMMAPLASTY Bilateral 2000  . removal of ovary      Prior to Admission medications   Medication Sig Start Date End Date Taking? Authorizing Provider  ALPRAZolam (ALPRAZOLAM XR) 0.5 MG 24 hr tablet Take 1 tablet (0.5 mg total) by mouth daily. 12/30/16   Steele Sizer, MD  Ascorbic Acid (VITAMIN C) 100 MG tablet Take 100 mg by mouth daily.    Historical Provider, MD  aspirin 81 MG tablet Take 81 mg by mouth daily.    Historical Provider, MD  baclofen (LIORESAL) 10 MG tablet TAKE ONE TABLET BY MOUTH TWICE DAILY AS NEEDED 01/01/17   Steele Sizer, MD  Biotin 10 MG CAPS Take 10 mg by mouth once.    Historical Provider, MD  cholecalciferol (VITAMIN D) 1000 UNITS tablet Take 1,000 Units by mouth 2 (two) times daily.      Historical Provider, MD  diclofenac sodium (VOLTAREN) 1 % GEL Apply 2 g topically 4 (four) times daily. 01/15/17   Steele Sizer, MD  Diphenhydramine-APAP, sleep, (TYLENOL PM EXTRA STRENGTH PO) Take by mouth.    Historical Provider, MD  Dulaglutide (TRULICITY) 1.5 0000000 SOPN Inject 1.5 mg into the skin once a week. 12/30/16   Steele Sizer, MD  fluticasone (FLONASE) 50 MCG/ACT nasal spray Place 2 sprays into both nostrils daily. 01/24/17   Jami L Hagler, PA-C  glucose blood (ONETOUCH VERIO) test strip 1 each by Other route 2 (two) times daily. Use as instructed 09/26/15  Steele Sizer, MD  GLUCOSE BLOOD VI  07/15/12   Historical Provider, MD  lisinopril-hydrochlorothiazide (PRINZIDE,ZESTORETIC) 20-12.5 MG tablet Take 1 tablet by mouth daily. 12/30/16   Steele Sizer, MD  magnesium 30 MG tablet Take 30 mg by mouth.    Historical Provider, MD  metFORMIN (GLUCOPHAGE-XR) 500 MG 24 hr tablet Take 2 tablets (1,000 mg total) by mouth daily with breakfast. 12/30/16   Steele Sizer, MD  Multiple Vitamin (MULITIVITAMIN WITH MINERALS) TABS Take 1 tablet by mouth daily.      Historical Provider, MD   [facility] DELICA LANCETS FINE MISC 1 Units by Does not apply route 2 (two) times daily. 09/26/15   Steele Sizer, MD  Pitavastatin Calcium (LIVALO) 2 MG TABS Take 1 tablet (2 mg total) by mouth daily. 12/30/16   Steele Sizer, MD  promethazine-dextromethorphan (PROMETHAZINE-DM) 6.25-15 MG/5ML syrup Take 5 mLs by mouth 4 (four) times daily as needed for cough. 01/24/17   Jami L Hagler, PA-C    Allergies Atorvastatin; Compazine; and Penicillins  Family History  Problem Relation Age of Onset  . Diabetes Mother   . Cancer Mother 10    Breast  . Breast cancer Mother 80  . Diabetes Daughter     Oldest Daughter  . Stroke Maternal Uncle   . Breast cancer Maternal Aunt 55    Social History Social History  Substance Use Topics  . Smoking status: Never Smoker  . Smokeless tobacco: Never Used  . Alcohol use No     Review of Systems  Constitutional: No fever/chills, fatigue Eyes: No visual changes. ENT: Nasal congestion, runny nose, left ear pain. No sore throat, Sinus pressure. Cardiovascular: No chest pain. Respiratory: Positive dry cough without chest congestion. No shortness of breath. No wheezing.  Gastrointestinal: Positive nausea without vomiting. No abdominal pain.  No diarrhea.  Musculoskeletal: Negative for general myalgias.  Skin: Negative for rash. Neurological: Negative for headaches. 10-point ROS otherwise negative.  ____________________________________________   PHYSICAL EXAM:  VITAL SIGNS: ED Triage Vitals [01/24/17 0722]  Enc Vitals Group     BP (!) 153/92     Pulse Rate 75     Resp 16     Temp 98.3 F (36.8 C)     Temp src      SpO2 97 %     Weight 275 lb (124.7 kg)     Height 5\' 5"  (1.651 m)     Head Circumference      Peak Flow      Pain Score      Pain Loc      Pain Edu?      Excl. in [place]?      Constitutional: Alert and oriented. Well appearing and in no acute distress. Eyes: Conjunctivae are normal.  Head: Atraumatic. ENT:      Ears: TMs  visualized bilaterally with mild bulging but no effusion, erythema or perforation.      Nose: Not congestion with clear rhinorrhea. Bilateral turbinates are injected.      Mouth/Throat: Mucous membranes are moist. Nares without erythema, swelling, exudate. Uvula is midline. Airway is patent. Clear postnasal drainage. Neck: No stridor. Supple with full range of motion. Hematological/Lymphatic/Immunilogical: No cervical lymphadenopathy. Cardiovascular: Normal rate, regular rhythm. Normal S1 and S2.  Good peripheral circulation. Respiratory: Normal respiratory effort without tachypnea or retractions. Lungs CTAB with breath sounds noted in all lung fields. No wheeze, rhonchi, rales. Neurologic:  Normal speech and language. No gross focal neurologic deficits are appreciated.  Skin:  Skin is  warm, dry and intact. No rash noted. Psychiatric: Mood and affect are normal. Speech and behavior are normal. Patient exhibits appropriate insight and judgement.   ____________________________________________   LABS (all labs ordered are listed, but only abnormal results are displayed)  Labs Reviewed  INFLUENZA PANEL BY PCR (TYPE A & B)   ____________________________________________  EKG  None ____________________________________________  RADIOLOGY  None ____________________________________________    PROCEDURES  Procedure(s) performed: None   Procedures   Medications - No data to display   ____________________________________________   INITIAL IMPRESSION / ASSESSMENT AND PLAN / ED COURSE  Pertinent labs & imaging results that were available during my care of the patient were reviewed by me and considered in my medical decision making (see chart for details).     Patient's diagnosis is consistent with Viral URI with cough. Patient will be discharged home with prescriptions for Flonase and promethazine DM to take as directed. Patient is to follow up with her primary care provider  if symptoms persist past this treatment course. Patient is given ED precautions to return to the ED for any worsening or new symptoms.    ____________________________________________  FINAL CLINICAL IMPRESSION(S) / ED DIAGNOSES  Final diagnoses:  Viral URI with cough      NEW MEDICATIONS STARTED DURING THIS VISIT:  New Prescriptions   FLUTICASONE (FLONASE) 50 MCG/ACT NASAL SPRAY    Place 2 sprays into both nostrils daily.   PROMETHAZINE-DEXTROMETHORPHAN (PROMETHAZINE-DM) 6.25-15 MG/5ML SYRUP    Take 5 mLs by mouth 4 (four) times daily as needed for cough.         Braxton Feathers, PA-C 01/24/17 [id number]    Daymon Larsen, MD 01/24/17 [phone]

## 2017-01-24 NOTE — ED Triage Notes (Signed)
Pt c/o "not feeling well." Reports everyone at her office is sick and worried she caught something. C/o congestion and nausea, no vomiting, no diarrhea, no abdominal pain. Pt also c/o left ear pain. Afebrile. VS stable.

## 2017-02-11 ENCOUNTER — Other Ambulatory Visit: Payer: Self-pay | Admitting: Family Medicine

## 2017-02-11 ENCOUNTER — Telehealth: Payer: Self-pay | Admitting: Family Medicine

## 2017-02-11 DIAGNOSIS — Z1231 Encounter for screening mammogram for malignant neoplasm of breast: Secondary | ICD-10-CM

## 2017-02-11 NOTE — Telephone Encounter (Signed)
PT SAID DR IS LETTING HER TRY TRULICITY AND HAS TAKEN 6 DOSES AND IS HAVING MORE BLURRED VISION. DOES NOT FEEL IT IS CONTROLING HER SUGAR. IS NOT TAKING HER SUGAR LEVELS.

## 2017-02-12 NOTE — Telephone Encounter (Signed)
Left message to call us back with blood sugar logs and to call to schedule eye exam.

## 2017-02-12 NOTE — Telephone Encounter (Signed)
She needs to check her glucose before I can adjust her medication. We may want to switch from Metformin to Synjardi 12.04/999 mg XR zero copay if glucose is still high. She also needs to go see her eye doctor

## 2017-02-25 ENCOUNTER — Telehealth: Payer: Self-pay | Admitting: Family Medicine

## 2017-02-25 ENCOUNTER — Other Ambulatory Visit: Payer: Self-pay | Admitting: Family Medicine

## 2017-02-25 DIAGNOSIS — E1140 Type 2 diabetes mellitus with diabetic neuropathy, unspecified: Secondary | ICD-10-CM

## 2017-02-25 DIAGNOSIS — IMO0002 Reserved for concepts with insufficient information to code with codable children: Secondary | ICD-10-CM

## 2017-02-25 DIAGNOSIS — E1165 Type 2 diabetes mellitus with hyperglycemia: Principal | ICD-10-CM

## 2017-02-25 NOTE — Telephone Encounter (Signed)
Pt would like a call back about one of her medications.

## 2017-02-26 ENCOUNTER — Other Ambulatory Visit: Payer: Self-pay | Admitting: Family Medicine

## 2017-02-26 DIAGNOSIS — E1140 Type 2 diabetes mellitus with diabetic neuropathy, unspecified: Secondary | ICD-10-CM

## 2017-02-26 DIAGNOSIS — IMO0002 Reserved for concepts with insufficient information to code with codable children: Secondary | ICD-10-CM

## 2017-02-26 DIAGNOSIS — E1165 Type 2 diabetes mellitus with hyperglycemia: Principal | ICD-10-CM

## 2017-02-26 NOTE — Telephone Encounter (Signed)
Two hour post-prandial can go up to 180 and still be considered under control, checking before 2 hours is not an accurate reading.  I don't recommend resuming glipizide, but it would be great if she could check fsbs fasting a couple times a week and a couple of times a week 2 hours after a meal

## 2017-02-26 NOTE — Telephone Encounter (Signed)
Patient states she has not been checking her sugar, but had the pharmacist check it after eating frozen yogurt at 6 p.m. And her sugar was around 198. Patient is coming in next month, and wanted to know if Dr. Ancil Boozer would call her in some Glipizide medication to her [place]. She has been using the Trulicity every week.

## 2017-04-01 ENCOUNTER — Ambulatory Visit (INDEPENDENT_AMBULATORY_CARE_PROVIDER_SITE_OTHER): Payer: BC Managed Care – PPO | Admitting: Family Medicine

## 2017-04-01 ENCOUNTER — Other Ambulatory Visit: Payer: Self-pay | Admitting: Family Medicine

## 2017-04-01 ENCOUNTER — Telehealth: Payer: Self-pay | Admitting: Family Medicine

## 2017-04-01 ENCOUNTER — Encounter: Payer: Self-pay | Admitting: Family Medicine

## 2017-04-01 ENCOUNTER — Ambulatory Visit
Admission: RE | Admit: 2017-04-01 | Discharge: 2017-04-01 | Disposition: A | Discharge status: Home / Self Care | Payer: BC Managed Care – PPO | Source: Ambulatory Visit | Attending: Family Medicine | Admitting: Family Medicine

## 2017-04-01 VITALS — BP 134/82 | HR 100 | Temp 97.8°F | Resp 16 | Ht 65.0 in | Wt 269.8 lb

## 2017-04-01 DIAGNOSIS — Z6841 Body Mass Index (BMI) 40.0 and over, adult: Secondary | ICD-10-CM | POA: Diagnosis not present

## 2017-04-01 DIAGNOSIS — E785 Hyperlipidemia, unspecified: Secondary | ICD-10-CM | POA: Diagnosis not present

## 2017-04-01 DIAGNOSIS — Z1231 Encounter for screening mammogram for malignant neoplasm of breast: Secondary | ICD-10-CM | POA: Diagnosis not present

## 2017-04-01 DIAGNOSIS — M62838 Other muscle spasm: Secondary | ICD-10-CM

## 2017-04-01 DIAGNOSIS — E1142 Type 2 diabetes mellitus with diabetic polyneuropathy: Secondary | ICD-10-CM | POA: Diagnosis not present

## 2017-04-01 DIAGNOSIS — F330 Major depressive disorder, recurrent, mild: Secondary | ICD-10-CM

## 2017-04-01 DIAGNOSIS — F411 Generalized anxiety disorder: Secondary | ICD-10-CM | POA: Diagnosis not present

## 2017-04-01 DIAGNOSIS — I10 Essential (primary) hypertension: Secondary | ICD-10-CM | POA: Diagnosis not present

## 2017-04-01 LAB — POCT GLYCOSYLATED HEMOGLOBIN (HGB A1C): Hemoglobin A1C: 9.1

## 2017-04-01 IMAGING — MG MM DIGITAL SCREENING BILAT W/ CAD
4 series · 4 of 4 positions shown · non-contrast
Comparison: Previous exam(s).

CLINICAL DATA: Screening.

EXAM:
DIGITAL SCREENING BILATERAL MAMMOGRAM WITH CAD

[L MLO]
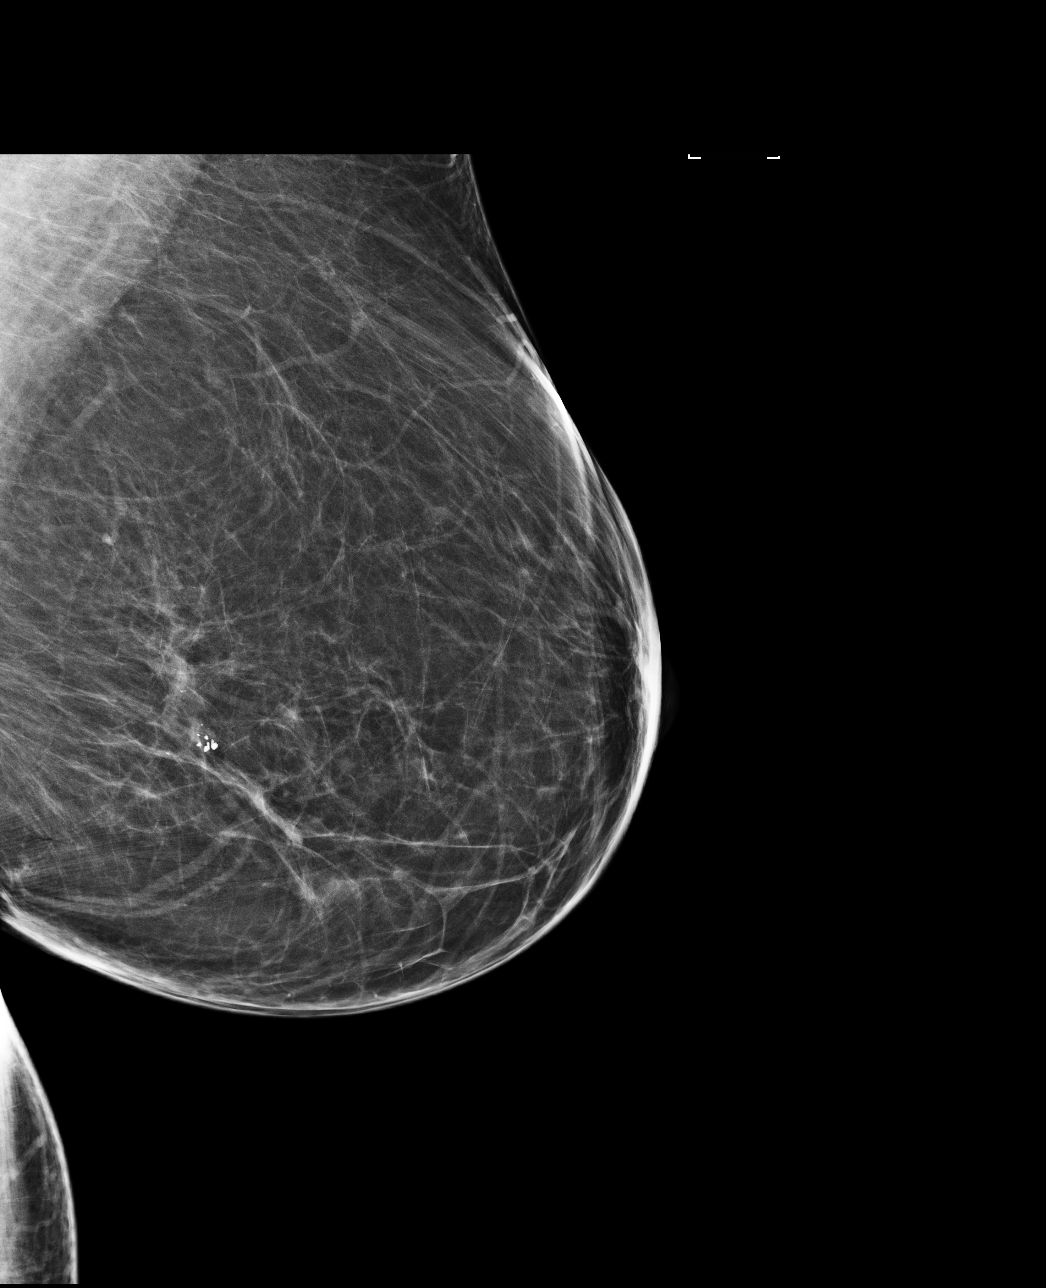

[L CC]
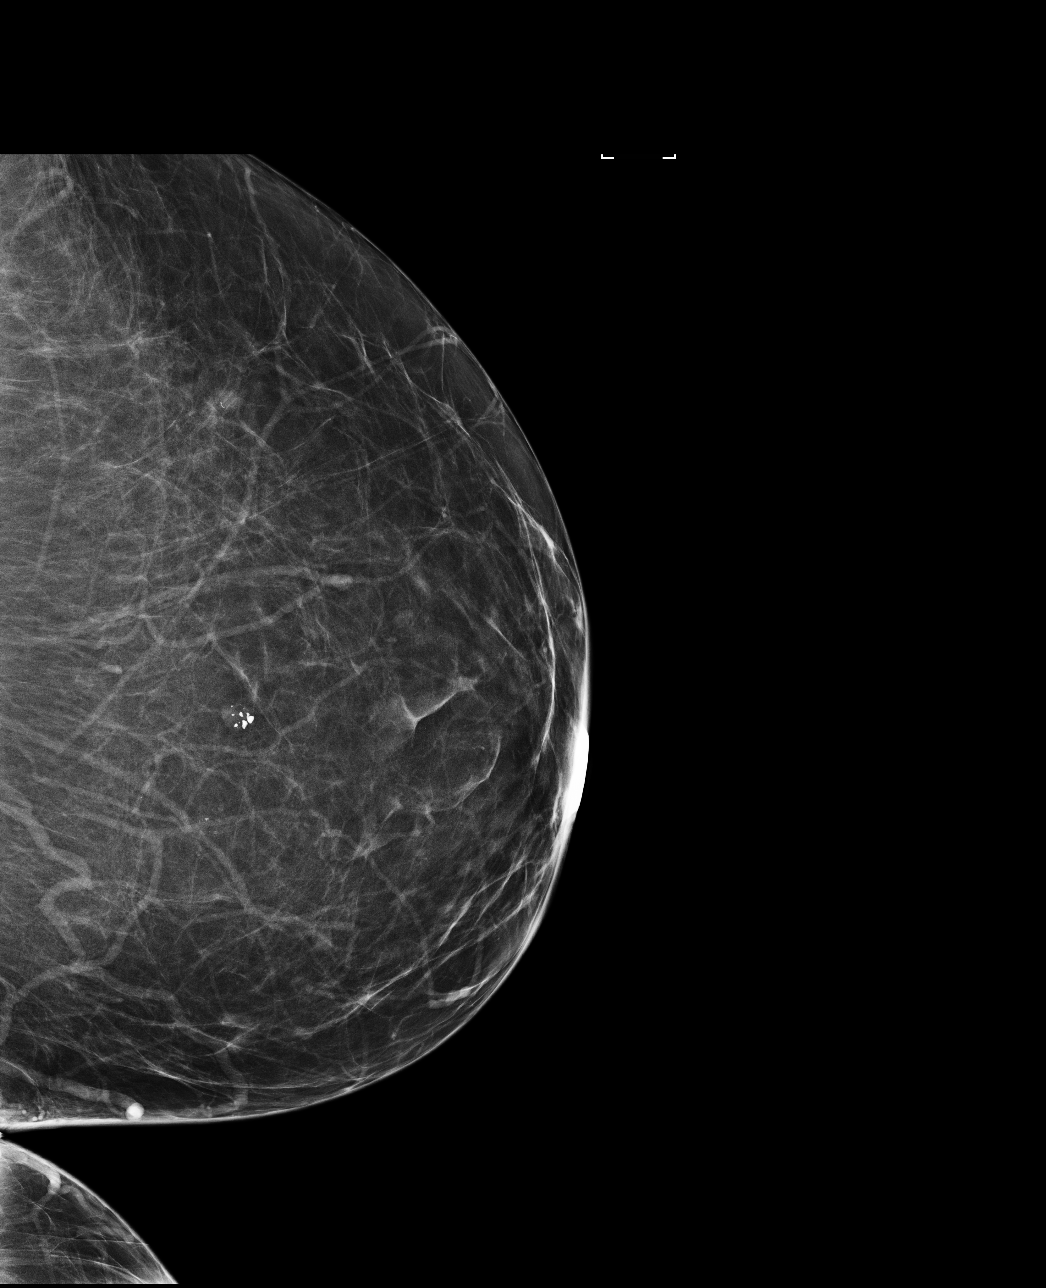

[R CC]
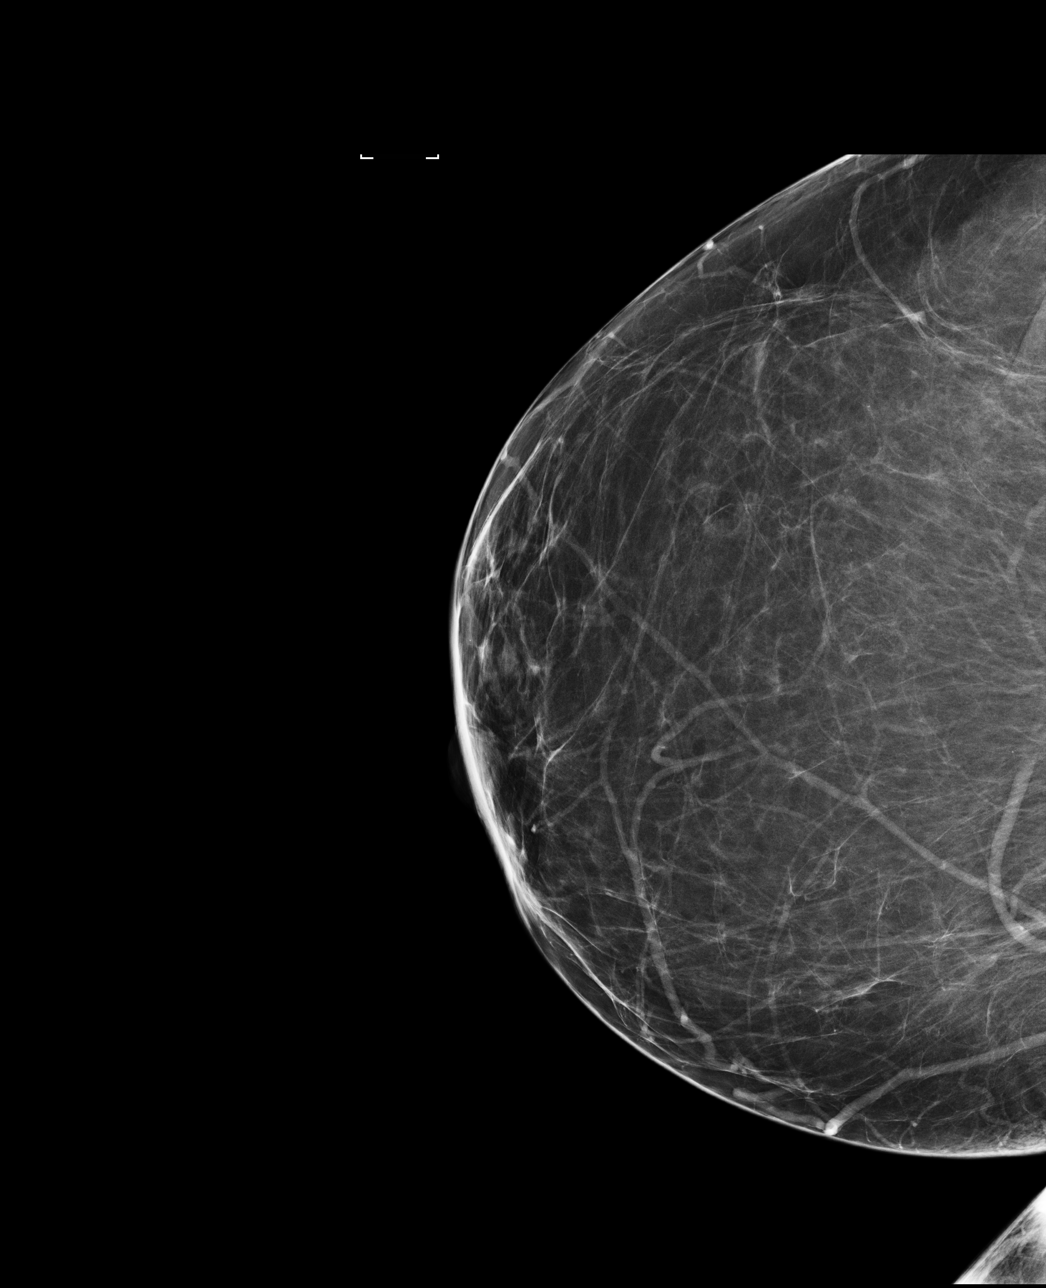

[R MLO]
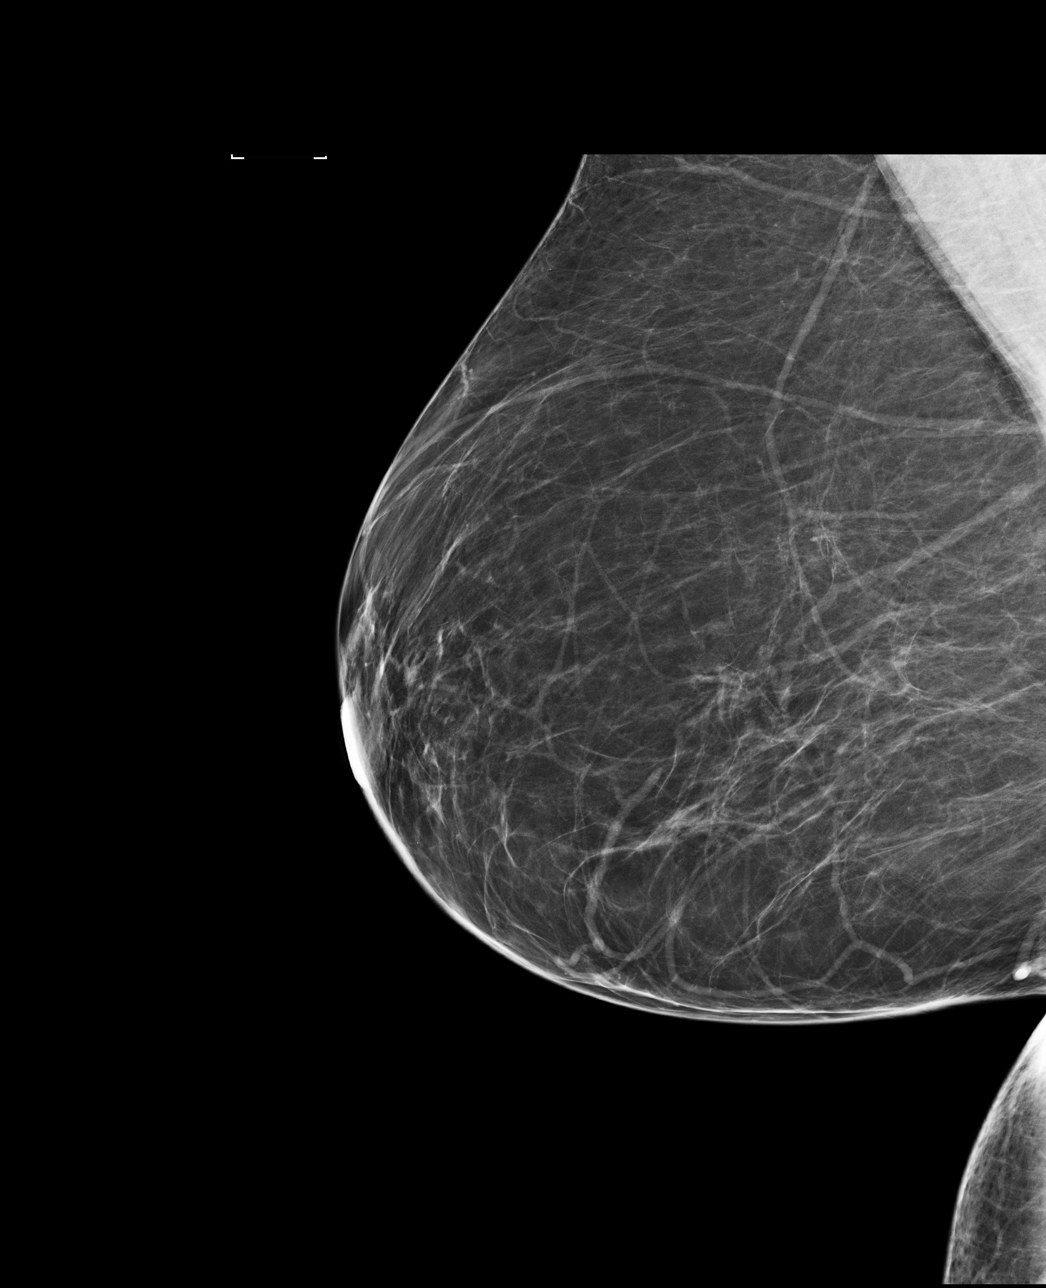

[4 of 4 positions shown; findings below may reference images not displayed]

ACR Breast Density Category b: There are scattered areas of
fibroglandular density.
FINDINGS: There are no findings suspicious for malignancy. Images were
processed with CAD.
IMPRESSION: No mammographic evidence of malignancy. A result letter of this
screening mammogram will be mailed directly to the patient.

RECOMMENDATION:
Screening mammogram in one year. (Code:[US])

BI-RADS CATEGORY  1: Negative.

## 2017-04-01 MED ORDER — BACLOFEN 10 MG PO TABS: 10.0000 mg | ORAL_TABLET | Freq: Two times a day (BID) | ORAL | 1 refills | Status: DC | PRN

## 2017-04-01 MED ORDER — ALPRAZOLAM ER 0.5 MG PO TB24: 0.5000 mg | ORAL_TABLET | Freq: Every day | ORAL | 2 refills | Status: DC

## 2017-04-01 MED ORDER — INSULIN PEN NEEDLE 32G X 6 MM MISC: 1.0000 | Freq: Every day | 1 refills | Status: DC

## 2017-04-01 MED ORDER — INSULIN DEGLUDEC-LIRAGLUTIDE 100-3.6 UNIT-MG/ML ~~LOC~~ SOPN
16.0000 [IU] | PEN_INJECTOR | Freq: Every day | SUBCUTANEOUS | 0 refills | Status: DC
Start: 2017-04-01 — End: 2017-05-12

## 2017-04-01 MED ORDER — GLUCOSE BLOOD VI STRP: ORAL_STRIP | 12 refills | Status: DC

## 2017-04-01 MED ORDER — PITAVASTATIN CALCIUM 2 MG PO TABS: 1.0000 | ORAL_TABLET | Freq: Every day | ORAL | 2 refills | Status: DC

## 2017-04-01 MED ORDER — ONETOUCH DELICA LANCETS FINE MISC: 1.0000 [IU] | Freq: Two times a day (BID) | 2 refills | Status: DC

## 2017-04-01 NOTE — Progress Notes (Signed)
Name: Lindsay Mcdonald   MRN: [id number]    DOB: 1961-02-21   Date:04/01/2017       Progress Note  Subjective  Chief Complaint  Chief Complaint  Patient presents with  . Medication Refill    3 month F/U  . Hypertension    Denies any symptoms, checks BP occasionally at work  . Hyperlipidemia    Takes half a day daily  . Allergic Rhinitis   . Diabetes    Patient has not seen Endocrinology and started Trulicity last visit. Does not check sugar at home    HPI  DMII uncontrolled with neuropathy: she states pain on her feet pain is not as severe now. She has been compliant with Metformin and Glipizide but did not keep follow up with Dr. Gabriel Carina because it is more expensive to see sub-specialist. She has decreased dessert intake. She states she still has blurred vision She had an eye exam, but did not have her eyes dilated. Denies polydipsia, polyuria or polyphagia. She denies any symptoms of hypoglycemia. Taking aspirin daily, also on ACE. HgbA1C is even higher at 9.1% explained long term risk of uncontrolled glucose again to the patient. She is willing to try Xultophy even though she is afraid of needles to get glucose to goal, this is my last try before sending her back to Endocrinologist.  HTN: taking lisinopril/HCTZ without any side effects, bp is at goal, denies chest pain, palpitation, or SOB  Hyperlipidemia; she has increased compliance with Livalo.  Vascular Malformation: she states she has a group of vessels on right outer thigh, that she noticed her 20 's, not growing or painful, she had appointment in July 2017 but cancelled because of cost  Major Depression with anxiety: she took Zoloft in the past, states Cymbalta caused "weird feeling ". She asked me about Latuda in the past but she wants to hold off for now. She has difficulty focusing at work. No recent crying spells. She denies suicidal thoughts or ideation. She states Alprazolam XR 0.5 mg daily and she feels like it is  helping with anxiety, she states energy level has improved and wants to hold off on SSRI  Morbid Obesity: weight is lower, but glucose is out of control, explained importance of getting glucose under control.    Patient Active Problem List   Diagnosis Date Noted  . Venous vascular malformations 03/27/2016  . Diabetic peripheral neuropathy associated with type 2 diabetes mellitus ([place]) 09/26/2015  . Non compliance w medication regimen 09/26/2015  . Seasonal allergic rhinitis 09/26/2015  . Muscle spasms of neck 06/19/2015  . Insomnia 06/19/2015  . Osteoarthritis, knee 06/17/2015  . Depression, major, recurrent, mild ([place]) 06/17/2015  . Diabetes mellitus type 2, uncontrolled ([place]) 06/17/2015  . Dyslipidemia 06/17/2015  . Benign hypertension 06/17/2015  . Morbid obesity with BMI of 40.0-44.9, adult ([place]) 06/17/2015  . Vitamin D deficiency 06/17/2015    Past Surgical History:  Procedure Laterality Date  . BREAST REDUCTION SURGERY    . nasal endoscopic    . REDUCTION MAMMAPLASTY Bilateral 2000  . removal of ovary      Family History  Problem Relation Age of Onset  . Diabetes Mother   . Cancer Mother 60    Breast  . Breast cancer Mother 40  . Diabetes Daughter     Oldest Daughter  . Stroke Maternal Uncle   . Breast cancer Maternal Aunt 82    Social History   Social History  . Marital status: Single  Spouse name: N/A  . Number of children: N/A  . Years of education: N/A   Occupational History  . Not on file.   Social History Main Topics  . Smoking status: Never Smoker  . Smokeless tobacco: Never Used  . Alcohol use No  . Drug use: No  . Sexual activity: Yes    Birth control/ protection: None   Other Topics Concern  . Not on file   Social History Narrative  . No narrative on file     Current Outpatient Prescriptions:  .  ALPRAZolam (ALPRAZOLAM XR) 0.5 MG 24 hr tablet, Take 1 tablet (0.5 mg total) by mouth daily., Disp: 30 tablet, Rfl: 2 .  aspirin 81  MG tablet, Take 81 mg by mouth daily., Disp: , Rfl:  .  baclofen (LIORESAL) 10 MG tablet, Take 1 tablet (10 mg total) by mouth 2 (two) times daily as needed., Disp: 90 tablet, Rfl: 1 .  Biotin 10 MG CAPS, Take 10 mg by mouth once., Disp: , Rfl:  .  cholecalciferol (VITAMIN D) 1000 UNITS tablet, Take 1,000 Units by mouth 2 (two) times daily.  , Disp: , Rfl:  .  Cinnamon 500 MG capsule, Take 500 mg by mouth 2 (two) times daily., Disp: , Rfl:  .  diclofenac sodium (VOLTAREN) 1 % GEL, Apply 2 g topically 4 (four) times daily., Disp: 100 g, Rfl: 2 .  Diphenhydramine-APAP, sleep, (TYLENOL PM EXTRA STRENGTH PO), Take by mouth., Disp: , Rfl:  .  fluticasone (FLONASE) 50 MCG/ACT nasal spray, Place 2 sprays into both nostrils daily., Disp: 16 g, Rfl: 0 .  lisinopril-hydrochlorothiazide (PRINZIDE,ZESTORETIC) 20-12.5 MG tablet, Take 1 tablet by mouth daily., Disp: 90 tablet, Rfl: 1 .  magnesium 30 MG tablet, Take 30 mg by mouth., Disp: , Rfl:  .  Multiple Vitamin (MULITIVITAMIN WITH MINERALS) TABS, Take 1 tablet by mouth daily.  , Disp: , Rfl:  .  ONETOUCH DELICA LANCETS FINE MISC, 1 Units by Does not apply route 2 (two) times daily., Disp: 100 each, Rfl: 2 .  Pitavastatin Calcium (LIVALO) 2 MG TABS, Take 1 tablet (2 mg total) by mouth daily., Disp: 30 tablet, Rfl: 2 .  Insulin Degludec-Liraglutide (XULTOPHY) 100-3.6 UNIT-MG/ML SOPN, Inject 16-50 Units into the skin daily., Disp: 9 mL, Rfl: 0 .  Insulin Pen Needle 32G X 6 MM MISC, 1 each by Does not apply route daily., Disp: 100 each, Rfl: 1  Allergies  Allergen Reactions  . Atorvastatin     and pravastatin caused muscle cramps, also simvastatin  . Compazine Other (See Comments)    Twitching and spasms of neck muscles  . Penicillins     rash     ROS  Constitutional: Negative for fever, positive for  weight change.  Respiratory: Negative for cough and shortness of breath.   Cardiovascular: Negative for chest pain or palpitations.   Gastrointestinal: Negative for abdominal pain, no bowel changes.  Musculoskeletal: Negative for gait problem or joint swelling.  Skin: Negative for rash.  Neurological: Negative for dizziness or headache.  No other specific complaints in a complete review of systems (except as listed in HPI above).  Objective  Vitals:   04/01/17 0839  BP: 134/82  Pulse: 100  Resp: 16  Temp: 97.8 F (36.6 C)  TempSrc: Oral  SpO2: 95%  Weight: 269 lb 12.8 oz (122.4 kg)  Height: 5\' 5"  (1.651 m)    Body mass index is 44.9 kg/m.  Physical Exam  Constitutional: Patient appears well-developed and  well-nourished. Obese  No distress.  HEENT: head atraumatic, normocephalic, pupils equal and reactive to light, neck supple, throat within normal limits Cardiovascular: Normal rate, regular rhythm and normal heart sounds.  No murmur heard. No BLE edema. Pulmonary/Chest: Effort normal and breath sounds normal. No respiratory distress. Abdominal: Soft.  There is no tenderness. Psychiatric: Patient has a normal mood and affect. behavior is normal. Judgment and thought content normal.  Recent Results (from the past 2160 hour(s))  Influenza panel by PCR (type A & B)     Status: None   Collection Time: 01/24/17  7:40 AM  Result Value Ref Range   Influenza A By PCR NEGATIVE NEGATIVE   Influenza B By PCR NEGATIVE NEGATIVE    Comment: (NOTE) The Xpert Xpress Flu assay is intended as an aid in the diagnosis of  influenza and should not be used as a sole basis for treatment.  This  assay is FDA approved for nasopharyngeal swab specimens only. Nasal  washings and aspirates are unacceptable for Xpert Xpress Flu testing.   POCT HgB A1C     Status: Abnormal   Collection Time: 04/01/17  8:59 AM  Result Value Ref Range   Hemoglobin A1C 9.1      PHQ2/9: Depression screen [facility] 2/9 04/01/2017 09/24/2016 06/27/2016 03/27/2016 01/02/2016  Decreased Interest 0 0 0 0 0  Down, Depressed, Hopeless 0 0 0 0 1  PHQ - 2 Score 0  0 0 0 1     Fall Risk: Fall Risk  04/01/2017 12/30/2016 09/24/2016 06/27/2016 03/27/2016  Falls in the past year? No No No No No     Functional Status Survey: Is the patient deaf or have difficulty hearing?: No Does the patient have difficulty seeing, even when wearing glasses/contacts?: No Does the patient have difficulty concentrating, remembering, or making decisions?: No Does the patient have difficulty walking or climbing stairs?: No Does the patient have difficulty dressing or bathing?: No Does the patient have difficulty doing errands alone such as visiting a doctor's office or shopping?: No    Assessment & Plan  1. Diabetic peripheral neuropathy associated with type 2 diabetes mellitus (HCC)  - POCT HgB A1C - Insulin Degludec-Liraglutide (XULTOPHY) 100-3.6 UNIT-MG/ML SOPN; Inject 16-50 Units into the skin daily.  Dispense: 9 mL; Refill: 0 - Insulin Pen Needle 32G X 6 MM MISC; 1 each by Does not apply route daily.  Dispense: 100 each; Refill: 1  2. Benign hypertension  At goal, continue medication  3. Dyslipidemia  - Pitavastatin Calcium (LIVALO) 2 MG TABS; Take 1 tablet (2 mg total) by mouth daily.  Dispense: 30 tablet; Refill: 2  4. Depression, major, recurrent, mild (HCC)  - ALPRAZolam (ALPRAZOLAM XR) 0.5 MG 24 hr tablet; Take 1 tablet (0.5 mg total) by mouth daily.  Dispense: 30 tablet; Refill: 2  5. Morbid obesity with BMI of 40.0-44.9, adult [facility])  Discussed with the patient the risk posed by an increased BMI. Discussed importance of portion control, calorie counting and at least 150 minutes of physical activity weekly. Avoid sweet beverages and drink more water. Eat at least 6 servings of fruit and vegetables daily   6. GAD (generalized anxiety disorder)  - ALPRAZolam (ALPRAZOLAM XR) 0.5 MG 24 hr tablet; Take 1 tablet (0.5 mg total) by mouth daily.  Dispense: 30 tablet; Refill: 2  7. Muscle spasms of neck  - baclofen (LIORESAL) 10 MG tablet; Take 1 tablet (10  mg total) by mouth 2 (two) times daily as needed.  Dispense:  90 tablet; Refill: 1

## 2017-04-01 NOTE — Telephone Encounter (Signed)
Patient requesting refill of One Touch Delica Lancets And test strips to [facility].

## 2017-04-01 NOTE — Telephone Encounter (Signed)
PT SAID THAT THE DR NEEDED TO KNOW THE NAME OF THE METER SHE HAS AND IT IS ONE TOUCH VERIO IQ. SHE SAID THAT SHE NEEDS STRIPS AND LANTIS FOR THIS.  THE NEXT THING IS SHE GAVE HER XULTOPHY AND SHE WANTS TO KNOW IF SHE COULD TAKE THIS AT NIGHT.

## 2017-04-01 NOTE — Telephone Encounter (Signed)
I will send lancets and strips to her pharmacy Xultophy will work best if she takes when she wakes up

## 2017-04-01 NOTE — Telephone Encounter (Signed)
Patient notified by voicemail. 

## 2017-04-01 NOTE — Patient Instructions (Signed)
Start Xultophy on 16 units daily and increase by 2 units every other day to a max of 50 units daily or when fasting sugar is between 100-120

## 2017-04-02 ENCOUNTER — Encounter: Payer: Self-pay | Admitting: Family Medicine

## 2017-04-03 ENCOUNTER — Encounter: Payer: Self-pay | Admitting: Family Medicine

## 2017-04-03 ENCOUNTER — Telehealth: Payer: Self-pay | Admitting: Family Medicine

## 2017-04-03 NOTE — Telephone Encounter (Signed)
Patient stated that she was started on a new DM medication Insulin Degludec-Liraglutide and was told to come back in a month to see if the medication was working. Pt stated that she is starting a new job and wants to try the medication out for 3 months.  If Dr. Ancil Boozer approves please send additional refills to the pharmacy.  Please let patient know the decision.

## 2017-04-06 ENCOUNTER — Other Ambulatory Visit: Payer: Self-pay | Admitting: Family Medicine

## 2017-04-06 NOTE — Telephone Encounter (Signed)
Patient notified by voicemail. Please schedule 3 month F/U

## 2017-04-06 NOTE — Telephone Encounter (Signed)
Pt has appointment for 04/13/17

## 2017-04-06 NOTE — Telephone Encounter (Signed)
Yes. I can send refills and follow up in 3 months. Pharmacy can request a refill when due

## 2017-04-13 ENCOUNTER — Ambulatory Visit: Payer: BC Managed Care – PPO | Admitting: Family Medicine

## 2017-05-07 ENCOUNTER — Ambulatory Visit: Payer: BC Managed Care – PPO | Admitting: Family Medicine

## 2017-05-11 ENCOUNTER — Other Ambulatory Visit: Payer: Self-pay | Admitting: Family Medicine

## 2017-05-11 DIAGNOSIS — E1142 Type 2 diabetes mellitus with diabetic polyneuropathy: Secondary | ICD-10-CM

## 2017-05-12 ENCOUNTER — Other Ambulatory Visit: Payer: Self-pay | Admitting: Family Medicine

## 2017-05-12 DIAGNOSIS — E1142 Type 2 diabetes mellitus with diabetic polyneuropathy: Secondary | ICD-10-CM

## 2017-05-12 MED ORDER — INSULIN DEGLUDEC-LIRAGLUTIDE 100-3.6 UNIT-MG/ML ~~LOC~~ SOPN: 16.0000 [IU] | PEN_INJECTOR | Freq: Every day | SUBCUTANEOUS | 0 refills | Status: DC

## 2017-05-12 NOTE — Telephone Encounter (Signed)
Patient requesting refill of Xultophy to [facility].

## 2017-05-15 NOTE — Telephone Encounter (Signed)
xultophy already refilled on 05/12/17

## 2017-06-11 ENCOUNTER — Encounter: Payer: Self-pay | Admitting: Family Medicine

## 2017-06-12 ENCOUNTER — Other Ambulatory Visit: Payer: Self-pay | Admitting: Family Medicine

## 2017-06-12 DIAGNOSIS — I10 Essential (primary) hypertension: Secondary | ICD-10-CM

## 2017-06-12 NOTE — Telephone Encounter (Signed)
Requesting refill on lilsinopril. Please send to [facility]

## 2017-06-13 ENCOUNTER — Other Ambulatory Visit: Payer: Self-pay | Admitting: Family Medicine

## 2017-06-13 DIAGNOSIS — I10 Essential (primary) hypertension: Secondary | ICD-10-CM

## 2017-06-14 MED ORDER — LISINOPRIL-HYDROCHLOROTHIAZIDE 20-12.5 MG PO TABS: 1.0000 | ORAL_TABLET | Freq: Every day | ORAL | 1 refills | Status: DC

## 2017-06-23 ENCOUNTER — Encounter: Payer: Self-pay | Admitting: Family Medicine

## 2017-06-23 ENCOUNTER — Ambulatory Visit (INDEPENDENT_AMBULATORY_CARE_PROVIDER_SITE_OTHER): Payer: BLUE CROSS/BLUE SHIELD | Admitting: Family Medicine

## 2017-06-23 VITALS — BP 126/84 | HR 99 | Temp 98.1°F | Resp 16 | Ht 65.0 in | Wt 274.2 lb

## 2017-06-23 DIAGNOSIS — F330 Major depressive disorder, recurrent, mild: Secondary | ICD-10-CM

## 2017-06-23 DIAGNOSIS — Z6841 Body Mass Index (BMI) 40.0 and over, adult: Secondary | ICD-10-CM | POA: Diagnosis not present

## 2017-06-23 DIAGNOSIS — E1142 Type 2 diabetes mellitus with diabetic polyneuropathy: Secondary | ICD-10-CM

## 2017-06-23 DIAGNOSIS — I10 Essential (primary) hypertension: Secondary | ICD-10-CM | POA: Diagnosis not present

## 2017-06-23 DIAGNOSIS — E785 Hyperlipidemia, unspecified: Secondary | ICD-10-CM | POA: Diagnosis not present

## 2017-06-23 DIAGNOSIS — M67441 Ganglion, right hand: Secondary | ICD-10-CM

## 2017-06-23 DIAGNOSIS — F411 Generalized anxiety disorder: Secondary | ICD-10-CM

## 2017-06-23 LAB — POCT GLYCOSYLATED HEMOGLOBIN (HGB A1C): Hemoglobin A1C: 8.8

## 2017-06-23 MED ORDER — INSULIN DEGLUDEC-LIRAGLUTIDE 100-3.6 UNIT-MG/ML ~~LOC~~ SOPN: 16.0000 [IU] | PEN_INJECTOR | Freq: Every day | SUBCUTANEOUS | 0 refills | Status: DC

## 2017-06-23 MED ORDER — PITAVASTATIN CALCIUM 2 MG PO TABS: 1.0000 | ORAL_TABLET | Freq: Every day | ORAL | 2 refills | Status: DC

## 2017-06-23 MED ORDER — ALPRAZOLAM ER 0.5 MG PO TB24: 0.5000 mg | ORAL_TABLET | Freq: Every day | ORAL | 2 refills | Status: DC

## 2017-06-23 NOTE — Progress Notes (Signed)
Name: Lindsay Mcdonald   MRN: [id number]    DOB: 1961-03-07   Date:06/23/2017       Progress Note  Subjective  Chief Complaint  Chief Complaint  Patient presents with  . Medication Refill    3 month F/U  . Diabetes    Checks every couple of days, Lowest-150 Average-200's   . Hypertension    At times she gets a headache, so doubled up on her Lisinopril  . Hyperlipidemia  . Depression    HPI  DMII uncontrolled with neuropathy: she states pain on her feet pain is not as severe now.She is on Xultophy since April 2018, currently of 22 units daily , she had a gap in insurance and was trying to make medication last. HgbA1C 9.1% and is down to 8.8%, still having fasting in the 200's.  Blurred vision has resolved. She had an eye exam, but did not have her eyes dilated. Denies polydipsia, polyuria or polyphagia. She denies any symptoms of hypoglycemia. Taking aspirin daily, also on ACE. HgbA1C is even higher at 9.1% explained again the risk of uncontrolled glucose again to the patient. She will titrate Xultophy to 50 units going up 2 units every 2 days, or stop if fasting glucose below 100.   HTN: taking lisinopril/HCTZ without any side effects, bp is at goal, denies chest pain, palpitation, or SOB  Hyperlipidemia; she has increased compliance with Livalo, but wants to wait to have labs in a couple of weeks, new insurance  Vascular Malformation: she states she has a group of vessels on right outer thigh, that she noticed her 53 's, not growing or painful, she had appointment in July 2017 but cancelled because of cost  Major Depression with anxiety: she took Zoloft in the past, states Cymbalta caused "weird feeling ". She asked me about Latuda in the past but she wants to hold off for now. She has difficulty focusing at work. No recent crying spells. She denies suicidal thoughts or ideation. She states Alprazolam XR 0.5 mg daily and she feels like it is helping with anxiety, discussed Prozac  weekly,   Morbid Obesity: weight is stable, explained that once she gets on 50 units of Xultophy she may have some weight loss. Discussed life style modification again.   Ganglion cyst: she noticed a bump on right hand, it is tender with pressure, no redness, seems to be growing slowly  Anal itching: noticed 2 weeks ago, no straining prior to episode, no intercourse prior to episode, she called and we advised Monistat and symptoms have improved   Patient Active Problem List   Diagnosis Date Noted  . Venous vascular malformations 03/27/2016  . Diabetic peripheral neuropathy associated with type 2 diabetes mellitus ([place]) 09/26/2015  . Non compliance w medication regimen 09/26/2015  . Seasonal allergic rhinitis 09/26/2015  . Muscle spasms of neck 06/19/2015  . Insomnia 06/19/2015  . Osteoarthritis, knee 06/17/2015  . Depression, major, recurrent, mild ([place]) 06/17/2015  . Diabetes mellitus type 2, uncontrolled ([place]) 06/17/2015  . Dyslipidemia 06/17/2015  . Benign hypertension 06/17/2015  . Morbid obesity with BMI of 40.0-44.9, adult ([place]) 06/17/2015  . Vitamin D deficiency 06/17/2015    Past Surgical History:  Procedure Laterality Date  . BREAST REDUCTION SURGERY    . nasal endoscopic    . REDUCTION MAMMAPLASTY Bilateral 2000  . removal of ovary      Family History  Problem Relation Age of Onset  . Diabetes Mother   . Cancer Mother 4  Breast  . Breast cancer Mother 39  . Diabetes Daughter        Oldest Daughter  . Stroke Maternal Uncle   . Breast cancer Maternal Aunt 36    Social History   Social History  . Marital status: Single    Spouse name: N/A  . Number of children: N/A  . Years of education: N/A   Occupational History  . Not on file.   Social History Main Topics  . Smoking status: Never Smoker  . Smokeless tobacco: Never Used  . Alcohol use No  . Drug use: No  . Sexual activity: Yes    Birth control/ protection: None   Other Topics Concern   . Not on file   Social History Narrative  . No narrative on file     Current Outpatient Prescriptions:  .  ALPRAZolam (ALPRAZOLAM XR) 0.5 MG 24 hr tablet, Take 1 tablet (0.5 mg total) by mouth daily. Fill July 10th and monthly thereafter, Disp: 30 tablet, Rfl: 2 .  aspirin 81 MG tablet, Take 81 mg by mouth daily., Disp: , Rfl:  .  baclofen (LIORESAL) 10 MG tablet, Take 1 tablet (10 mg total) by mouth 2 (two) times daily as needed., Disp: 90 tablet, Rfl: 1 .  Biotin 10 MG CAPS, Take 10 mg by mouth once., Disp: , Rfl:  .  cholecalciferol (VITAMIN D) 1000 UNITS tablet, Take 1,000 Units by mouth 2 (two) times daily.  , Disp: , Rfl:  .  Diphenhydramine-APAP, sleep, (TYLENOL PM EXTRA STRENGTH PO), Take by mouth., Disp: , Rfl:  .  fluticasone (FLONASE) 50 MCG/ACT nasal spray, Place 2 sprays into both nostrils daily., Disp: 16 g, Rfl: 0 .  glucose blood test strip, Use as instructed, Disp: 100 each, Rfl: 12 .  Insulin Degludec-Liraglutide (XULTOPHY) 100-3.6 UNIT-MG/ML SOPN, Inject 16-50 Units into the skin daily., Disp: 9 mL, Rfl: 0 .  Insulin Pen Needle 32G X 6 MM MISC, 1 each by Does not apply route daily., Disp: 100 each, Rfl: 1 .  lisinopril-hydrochlorothiazide (PRINZIDE,ZESTORETIC) 20-12.5 MG tablet, Take 1 tablet by mouth daily., Disp: 90 tablet, Rfl: 1 .  magnesium 30 MG tablet, Take 30 mg by mouth., Disp: , Rfl:  .  Multiple Vitamin (MULITIVITAMIN WITH MINERALS) TABS, Take 1 tablet by mouth daily.  , Disp: , Rfl:  .  ONETOUCH DELICA LANCETS FINE MISC, 1 Units by Does not apply route 2 (two) times daily., Disp: 100 each, Rfl: 2 .  Pitavastatin Calcium (LIVALO) 2 MG TABS, Take 1 tablet (2 mg total) by mouth daily., Disp: 30 tablet, Rfl: 2 .  Cinnamon 500 MG capsule, Take 500 mg by mouth 2 (two) times daily., Disp: , Rfl:  .  diclofenac sodium (VOLTAREN) 1 % GEL, Apply 2 g topically 4 (four) times daily. (Patient not taking: Reported on 06/23/2017), Disp: 100 g, Rfl: 2  Allergies  Allergen  Reactions  . Atorvastatin     and pravastatin caused muscle cramps, also simvastatin  . Compazine Other (See Comments)    Twitching and spasms of neck muscles  . Penicillins     rash     ROS  Constitutional: Negative for fever or weight change.  Respiratory: Negative for cough and shortness of breath.   Cardiovascular: Negative for chest pain or palpitations.  Gastrointestinal: Negative for abdominal pain, no bowel changes.  Musculoskeletal: Negative for gait problem or joint swelling.  Skin: ? Rash anal area  Neurological: Negative for dizziness or headache.  No other  specific complaints in a complete review of systems (except as listed in HPI above).  Objective  Vitals:   06/23/17 1130  BP: 126/84  Pulse: 99  Resp: 16  Temp: 98.1 F (36.7 C)  TempSrc: Oral  SpO2: 99%  Weight: 274 lb 3.2 oz (124.4 kg)  Height: 5\' 5"  (1.651 m)    Body mass index is 45.63 kg/m.  Physical Exam  Constitutional: Patient appears well-developed and well-nourished. Obese  No distress.  HEENT: head atraumatic, normocephalic, pupils equal and reactive to light, neck supple, throat within normal limits Cardiovascular: Normal rate, regular rhythm and normal heart sounds.  No murmur heard. No BLE edema. Pulmonary/Chest: Effort normal and breath sounds normal. No respiratory distress. Abdominal: Soft.  There is no tenderness. Anal skin tag, anal exam no rashes or redness Psychiatric: Patient has a normal mood and affect. behavior is normal. Judgment and thought content normal. Muscular Skeletal: nodule, rolls under skin base of thumb right hand, likely ganglion cyst   Recent Results (from the past 2160 hour(s))  POCT HgB A1C     Status: Abnormal   Collection Time: 04/01/17  8:59 AM  Result Value Ref Range   Hemoglobin A1C 9.1   POCT HgB A1C     Status: Abnormal   Collection Time: 06/23/17 11:38 AM  Result Value Ref Range   Hemoglobin A1C 8.8     Diabetic Foot Exam: Diabetic Foot Exam -  Simple   Simple Foot Form Diabetic Foot exam was performed with the following findings:  Yes 06/23/2017 11:45 AM  Visual Inspection See comments:  Yes Sensation Testing Intact to touch and monofilament testing bilaterally:  Yes Pulse Check Posterior Tibialis and Dorsalis pulse intact bilaterally:  Yes Comments Corn formation of the 2nd right toe      PHQ2/9: Depression screen [facility] 2/9 06/23/2017 04/01/2017 09/24/2016 06/27/2016 03/27/2016  Decreased Interest 0 0 0 0 0  Down, Depressed, Hopeless 0 0 0 0 0  PHQ - 2 Score 0 0 0 0 0     Fall Risk: Fall Risk  06/23/2017 04/01/2017 12/30/2016 09/24/2016 06/27/2016  Falls in the past year? No No No No No     Functional Status Survey: Is the patient deaf or have difficulty hearing?: No Does the patient have difficulty seeing, even when wearing glasses/contacts?: No Does the patient have difficulty concentrating, remembering, or making decisions?: No Does the patient have difficulty walking or climbing stairs?: No Does the patient have difficulty dressing or bathing?: No Does the patient have difficulty doing errands alone such as visiting a doctor's office or shopping?: No    Assessment & Plan  1. Diabetic peripheral neuropathy associated with type 2 diabetes mellitus ([place])  She did not titrate dose because she had a gap of insurance and fear of taking too much. - POCT HgB A1C - Insulin Degludec-Liraglutide (XULTOPHY) 100-3.6 UNIT-MG/ML SOPN; Inject 16-50 Units into the skin daily.  Dispense: 9 mL; Refill: 0  2. Benign hypertension  At goal today  3. Dyslipidemia  - Pitavastatin Calcium (LIVALO) 2 MG TABS; Take 1 tablet (2 mg total) by mouth daily.  Dispense: 30 tablet; Refill: 2  4. Morbid obesity with BMI of 40.0-44.9, adult [facility])  Discussed with the patient the risk posed by an increased BMI. Discussed importance of portion control, calorie counting and at least 150 minutes of physical activity weekly. Avoid sweet beverages and drink  more water. Eat at least 6 servings of fruit and vegetables daily   5. Depression, major,  recurrent, mild (HCC)  Discussed Prozac weekly but she wants to hold off for now, she changed jobs and wants to hold off for now - ALPRAZolam (ALPRAZOLAM XR) 0.5 MG 24 hr tablet; Take 1 tablet (0.5 mg total) by mouth daily. Fill July 10th and monthly thereafter  Dispense: 30 tablet; Refill: 2  6. GAD (generalized anxiety disorder)  - ALPRAZolam (ALPRAZOLAM XR) 0.5 MG 24 hr tablet; Take 1 tablet (0.5 mg total) by mouth daily. Fill July 10th and monthly thereafter  Dispense: 30 tablet; Refill: 2  7. Ganglion cyst of finger of right hand  Reassurance for now

## 2017-06-23 NOTE — Telephone Encounter (Signed)
Please advise 

## 2017-06-24 ENCOUNTER — Other Ambulatory Visit: Payer: Self-pay | Admitting: Family Medicine

## 2017-06-24 DIAGNOSIS — F330 Major depressive disorder, recurrent, mild: Secondary | ICD-10-CM

## 2017-06-24 MED ORDER — ESCITALOPRAM OXALATE 10 MG PO TABS: 10.0000 mg | ORAL_TABLET | Freq: Every day | ORAL | 0 refills | Status: DC

## 2017-06-24 NOTE — Progress Notes (Unsigned)
Sent 30 day to pharmacy, as requested by patient

## 2017-06-26 ENCOUNTER — Other Ambulatory Visit: Payer: Self-pay | Admitting: Family Medicine

## 2017-06-29 ENCOUNTER — Telehealth: Payer: Self-pay | Admitting: Family Medicine

## 2017-06-29 NOTE — Telephone Encounter (Signed)
Pt just called in to give her new insurance information. She is asking that you please refile her last visit. This card was in effect 06/21/17.

## 2017-06-30 NOTE — Telephone Encounter (Signed)
I ran patient's insurance information that is on her profile and it is still coming back as not eligible for coverage.  I also called [employer] and told that coverage was terminated on 05/21/17.

## 2017-07-01 NOTE — Telephone Encounter (Signed)
I have tried contacting patient yesterday and today and have not received a response.

## 2017-07-02 NOTE — Telephone Encounter (Signed)
Tried contacting patient again today.

## 2017-07-06 ENCOUNTER — Encounter: Payer: Self-pay | Admitting: Family Medicine

## 2017-07-06 NOTE — Telephone Encounter (Signed)
Pt called back stating that the insurance we have in for her now is active.

## 2017-07-06 NOTE — Telephone Encounter (Signed)
Spoke with patient and she gave her updated insurance information.  I processed it and she is now eligible with an effective date of 06/21/17.  I also sent a message to our claim specialist asking her to re-file the claim for date of service 06/23/17 with patient's new insurance information.  Patient stated that once she receives her new card she would come by our office so that it can be scanned into the system.

## 2017-07-07 ENCOUNTER — Other Ambulatory Visit: Payer: Self-pay | Admitting: Family Medicine

## 2017-07-07 MED ORDER — TIZANIDINE HCL 2 MG PO CAPS: 2.0000 mg | ORAL_CAPSULE | Freq: Every evening | ORAL | 0 refills | Status: DC | PRN

## 2017-07-23 ENCOUNTER — Telehealth: Payer: Self-pay | Admitting: Family Medicine

## 2017-07-23 ENCOUNTER — Encounter: Payer: Self-pay | Admitting: Family Medicine

## 2017-07-23 NOTE — Telephone Encounter (Signed)
Patient has noticed within last 2 weeks a bruise on left arm then it went away and now she has noticed on right arm.  Wanted to know if this was maybe due to the increase of insulin as needed per Dr. Ancil Boozer.  Has increased her cholesterol medication from 1/2 to a whole.

## 2017-07-23 NOTE — Telephone Encounter (Signed)
Unlikely to be related to change in medication, upper arm extremities is very common, we will monitor for now

## 2017-07-24 NOTE — Telephone Encounter (Signed)
Left pt a voicemail.

## 2017-07-29 ENCOUNTER — Other Ambulatory Visit: Payer: Self-pay | Admitting: Family Medicine

## 2017-07-29 DIAGNOSIS — E1142 Type 2 diabetes mellitus with diabetic polyneuropathy: Secondary | ICD-10-CM

## 2017-07-29 NOTE — Telephone Encounter (Signed)
Patient requesting refill of Xultophy to [facility].

## 2017-07-31 ENCOUNTER — Telehealth: Payer: Self-pay

## 2017-07-31 DIAGNOSIS — E1142 Type 2 diabetes mellitus with diabetic polyneuropathy: Secondary | ICD-10-CM

## 2017-07-31 MED ORDER — INSULIN DEGLUDEC-LIRAGLUTIDE 100-3.6 UNIT-MG/ML ~~LOC~~ SOPN: 16.0000 [IU] | PEN_INJECTOR | Freq: Every day | SUBCUTANEOUS | 1 refills | Status: DC

## 2017-07-31 NOTE — Telephone Encounter (Signed)
Patient notified about new prescription sent into her pharmacy.

## 2017-07-31 NOTE — Telephone Encounter (Signed)
Patient states the pharmacy told her the Xultophy supply would not be enough to last her for a month. Could we please change her prescription since she is on 32 units of Xultophy now.

## 2017-07-31 NOTE — Telephone Encounter (Signed)
Spoke with [facility] to clarify dosing. Patient is currently titrating her dose - at 32 units now, but is continuing to titrate up.  Corrected amount prescribed.

## 2017-08-03 ENCOUNTER — Other Ambulatory Visit: Payer: Self-pay

## 2017-08-03 MED ORDER — FLUTICASONE PROPIONATE 50 MCG/ACT NA SUSP: 2.0000 | Freq: Every day | NASAL | 0 refills | Status: DC

## 2017-08-03 NOTE — Telephone Encounter (Signed)
Patient requesting refill of Flonase to [facility].

## 2017-08-11 ENCOUNTER — Encounter: Payer: Self-pay | Admitting: Family Medicine

## 2017-08-14 ENCOUNTER — Encounter: Payer: BC Managed Care – PPO | Admitting: Obstetrics and Gynecology

## 2017-09-01 ENCOUNTER — Encounter: Payer: Self-pay | Admitting: Family Medicine

## 2017-09-28 ENCOUNTER — Ambulatory Visit (INDEPENDENT_AMBULATORY_CARE_PROVIDER_SITE_OTHER): Payer: BLUE CROSS/BLUE SHIELD | Admitting: Family Medicine

## 2017-09-28 ENCOUNTER — Encounter: Payer: Self-pay | Admitting: Family Medicine

## 2017-09-28 VITALS — BP 110/70 | HR 80 | Ht 65.0 in | Wt 280.6 lb

## 2017-09-28 DIAGNOSIS — F411 Generalized anxiety disorder: Secondary | ICD-10-CM | POA: Diagnosis not present

## 2017-09-28 DIAGNOSIS — IMO0002 Reserved for concepts with insufficient information to code with codable children: Secondary | ICD-10-CM

## 2017-09-28 DIAGNOSIS — Z23 Encounter for immunization: Secondary | ICD-10-CM

## 2017-09-28 DIAGNOSIS — Z6841 Body Mass Index (BMI) 40.0 and over, adult: Secondary | ICD-10-CM

## 2017-09-28 DIAGNOSIS — E785 Hyperlipidemia, unspecified: Secondary | ICD-10-CM | POA: Diagnosis not present

## 2017-09-28 DIAGNOSIS — H029 Unspecified disorder of eyelid: Secondary | ICD-10-CM

## 2017-09-28 DIAGNOSIS — F330 Major depressive disorder, recurrent, mild: Secondary | ICD-10-CM | POA: Diagnosis not present

## 2017-09-28 DIAGNOSIS — E1165 Type 2 diabetes mellitus with hyperglycemia: Secondary | ICD-10-CM

## 2017-09-28 DIAGNOSIS — E1140 Type 2 diabetes mellitus with diabetic neuropathy, unspecified: Secondary | ICD-10-CM

## 2017-09-28 DIAGNOSIS — M62838 Other muscle spasm: Secondary | ICD-10-CM | POA: Diagnosis not present

## 2017-09-28 DIAGNOSIS — I10 Essential (primary) hypertension: Secondary | ICD-10-CM

## 2017-09-28 LAB — POCT GLYCOSYLATED HEMOGLOBIN (HGB A1C): Hemoglobin A1C: 8.4

## 2017-09-28 MED ORDER — INSULIN DEGLUDEC-LIRAGLUTIDE 100-3.6 UNIT-MG/ML ~~LOC~~ SOPN: 50.0000 [IU] | PEN_INJECTOR | Freq: Every day | SUBCUTANEOUS | 2 refills | Status: DC

## 2017-09-28 MED ORDER — PITAVASTATIN CALCIUM 2 MG PO TABS: 1.0000 | ORAL_TABLET | Freq: Every day | ORAL | 1 refills | Status: DC

## 2017-09-28 MED ORDER — ALPRAZOLAM ER 0.5 MG PO TB24: 0.5000 mg | ORAL_TABLET | Freq: Every day | ORAL | 2 refills | Status: DC

## 2017-09-28 MED ORDER — DULOXETINE HCL 30 MG PO CPEP: 30.0000 mg | ORAL_CAPSULE | Freq: Every day | ORAL | 0 refills | Status: DC

## 2017-09-28 MED ORDER — BACLOFEN 10 MG PO TABS: 10.0000 mg | ORAL_TABLET | Freq: Two times a day (BID) | ORAL | 0 refills | Status: DC | PRN

## 2017-09-28 NOTE — Progress Notes (Signed)
Name: Lindsay Mcdonald   MRN: [id number]    DOB: 1961/02/12   Date:09/28/2017       Progress Note  Subjective  Chief Complaint  Chief Complaint  Patient presents with  . Diabetes  . Hypertension  . Hyperlipidemia  . Depression    HPI  DMII uncontrolled with neuropathy: she states pain on her feet pain is not as severe now.She is on Xultophy since April 2018, currently of 40 units daily , she had a gap in insurance and was trying to make medication last. HgbA1C 9.1% and to 8.8%, today is 8.4% glucose  150-170's , down from 200's, non-fasting levels  Blurred vision has resolved. She had an eye exam, but did not have her eyes dilated. Denies polydipsia, polyuria or polyphagia. She denies any symptoms of hypoglycemia. Taking aspirin daily, also on ACE.  She will titrate Xultophy to 50 units going up 2 units every 2 days, or stop if fasting glucose below 100.   HTN: taking lisinopril/HCTZ without any side effects, bp is at goal, denies chest pain, palpitation, dizziness  or SOB  Hyperlipidemia; she has increased compliance with Livalo, we will recheck labs today   Vascular Malformation: she states she has a group of vessels on right outer thigh, that she noticed her 60 's, not growing or painful,  She had an exam done by vascular surgeon, but lost to follow up  Major Depression with anxiety: she took Zoloft in the past, states Cymbalta caused "weird feeling ", she was afraid to start Lexapro because of side effects. She is also worried about depending on SSRI, but not bothered by Alprazolam XR. Explained that BZD's are controlled and she should try to start SSRI to be able to come off BZD.Marland Kitchen She has difficulty focusing at work. Positive for recent crying spells - mother moved in with her. She denies suicidal thoughts or ideation. She states Alprazolam XR 0.5 mg daily and she feels like it is helping with anxiety, she wants to try Cymbalta low dose.   Morbid Obesity: some weight gain since  last visit, she has a new job and has been more sedentary also snacks to relieve stress.   Patient Active Problem List   Diagnosis Date Noted  . Venous vascular malformations 03/27/2016  . Diabetic peripheral neuropathy associated with type 2 diabetes mellitus ([place]) 09/26/2015  . Non compliance w medication regimen 09/26/2015  . Seasonal allergic rhinitis 09/26/2015  . Muscle spasms of neck 06/19/2015  . Insomnia 06/19/2015  . Osteoarthritis, knee 06/17/2015  . Depression, major, recurrent, mild ([place]) 06/17/2015  . Diabetes mellitus type 2, uncontrolled ([place]) 06/17/2015  . Dyslipidemia 06/17/2015  . Benign hypertension 06/17/2015  . Morbid obesity with BMI of 40.0-44.9, adult ([place]) 06/17/2015  . Vitamin D deficiency 06/17/2015    Past Surgical History:  Procedure Laterality Date  . BREAST REDUCTION SURGERY    . nasal endoscopic    . REDUCTION MAMMAPLASTY Bilateral 2000  . removal of ovary      Family History  Problem Relation Age of Onset  . Diabetes Mother   . Cancer Mother 41       Breast  . Breast cancer Mother 53  . Diabetes Daughter        Oldest Daughter  . Stroke Maternal Uncle   . Breast cancer Maternal Aunt 9    Social History   Social History  . Marital status: Single    Spouse name: N/A  . Number of children: N/A  . Years  of education: N/A   Occupational History  . Not on file.   Social History Main Topics  . Smoking status: Current Every Day Smoker    Packs/day: 0.25    Years: 0.50    Types: E-cigarettes  . Smokeless tobacco: Never Used  . Alcohol use No  . Drug use: No  . Sexual activity: Yes    Birth control/ protection: None   Other Topics Concern  . Not on file   Social History Narrative   Divorced, she has a boyfriend   Working at [employer].      Current Outpatient Prescriptions:  .  aspirin 81 MG tablet, Take 81 mg by mouth daily., Disp: , Rfl:  .  Biotin 10 MG CAPS, Take 10 mg by mouth once., Disp: , Rfl:  .   cholecalciferol (VITAMIN D) 1000 UNITS tablet, Take 1,000 Units by mouth 2 (two) times daily.  , Disp: , Rfl:  .  diclofenac sodium (VOLTAREN) 1 % GEL, Apply 2 g topically 4 (four) times daily., Disp: 100 g, Rfl: 2 .  Diphenhydramine-APAP, sleep, (TYLENOL PM EXTRA STRENGTH PO), Take by mouth., Disp: , Rfl:  .  fluticasone (FLONASE) 50 MCG/ACT nasal spray, Place 2 sprays into both nostrils daily., Disp: 16 g, Rfl: 0 .  glucose blood test strip, Use as instructed, Disp: 100 each, Rfl: 12 .  Insulin Pen Needle 32G X 6 MM MISC, 1 each by Does not apply route daily., Disp: 100 each, Rfl: 1 .  lisinopril-hydrochlorothiazide (PRINZIDE,ZESTORETIC) 20-12.5 MG tablet, Take 1 tablet by mouth daily., Disp: 90 tablet, Rfl: 1 .  magnesium 30 MG tablet, Take 30 mg by mouth., Disp: , Rfl:  .  Multiple Vitamin (MULITIVITAMIN WITH MINERALS) TABS, Take 1 tablet by mouth daily.  , Disp: , Rfl:  .  ONETOUCH DELICA LANCETS FINE MISC, 1 Units by Does not apply route 2 (two) times daily., Disp: 100 each, Rfl: 2 .  ALPRAZolam (ALPRAZOLAM XR) 0.5 MG 24 hr tablet, Take 1 tablet (0.5 mg total) by mouth daily. Fill July 10th and monthly thereafter, Disp: 30 tablet, Rfl: 2 .  baclofen (LIORESAL) 10 MG tablet, Take 1 tablet (10 mg total) by mouth 2 (two) times daily as needed., Disp: 30 each, Rfl: 0 .  Cinnamon 500 MG capsule, Take 500 mg by mouth 2 (two) times daily., Disp: , Rfl:  .  DULoxetine (CYMBALTA) 30 MG capsule, Take 1-2 capsules (30-60 mg total) by mouth daily. First week 30 mg after that 60 mg daily, Disp: 60 capsule, Rfl: 0 .  Insulin Degludec-Liraglutide (XULTOPHY) 100-3.6 UNIT-MG/ML SOPN, Inject 50 Units into the skin daily., Disp: 15 mL, Rfl: 2 .  Pitavastatin Calcium (LIVALO) 2 MG TABS, Take 1 tablet (2 mg total) by mouth daily., Disp: 90 tablet, Rfl: 1  Allergies  Allergen Reactions  . Atorvastatin     and pravastatin caused muscle cramps, also simvastatin  . Baclofen     spasms  . Compazine Other (See  Comments)    Twitching and spasms of neck muscles  . Penicillins     rash     ROS  Constitutional: Negative for fever, positive for weight change.  Respiratory: Negative for cough and shortness of breath.   Cardiovascular: Negative for chest pain or palpitations.  Gastrointestinal: Negative for abdominal pain, no bowel changes.  Musculoskeletal: Negative for gait problem or joint swelling.  Skin: Negative for rash.   Neurological: Negative for dizziness or headache.  No other specific complaints in a  complete review of systems (except as listed in HPI above).  Objective  Vitals:   09/28/17 0815  BP: 110/70  Pulse: 80  SpO2: 97%  Weight: 280 lb 9.6 oz (127.3 kg)  Height: 5\' 5"  (1.651 m)    Body mass index is 46.69 kg/m.  Physical Exam  Constitutional: Patient appears well-developed and well-nourished. Obese No distress.  HEENT: head atraumatic, normocephalic, pupils equal and reactive to light,  neck supple, throat within normal limits,Small brown lesion on right upper eyelid - it does not seem to be a sty, follow up with eye doctor Cardiovascular: Normal rate, regular rhythm and normal heart sounds.  No murmur heard. No BLE edema. Pulmonary/Chest: Effort normal and breath sounds normal. No respiratory distress. Abdominal: Soft.  There is no tenderness. Psychiatric: Patient has a normal mood and affect. behavior is normal. Judgment and thought content normal.  PHQ2/9: Depression screen [facility] 2/9 06/23/2017 04/01/2017 09/24/2016 06/27/2016 03/27/2016  Decreased Interest 0 0 0 0 0  Down, Depressed, Hopeless 0 0 0 0 0  PHQ - 2 Score 0 0 0 0 0     Fall Risk: Fall Risk  06/23/2017 04/01/2017 12/30/2016 09/24/2016 06/27/2016  Falls in the past year? No No No No No    Assessment & Plan  1. Type 2 diabetes, uncontrolled, with neuropathy (HCC)  - POCT HgB A1C - Insulin Degludec-Liraglutide (XULTOPHY) 100-3.6 UNIT-MG/ML SOPN; Inject 50 Units into the skin daily.  Dispense: 15 mL;  Refill: 2  2. Depression, major, recurrent, mild (HCC)  - ALPRAZolam (ALPRAZOLAM XR) 0.5 MG 24 hr tablet; Take 1 tablet (0.5 mg total) by mouth daily. Fill July 10th and monthly thereafter  Dispense: 30 tablet; Refill: 2 - DULoxetine (CYMBALTA) 30 MG capsule; Take 1-2 capsules (30-60 mg total) by mouth daily. First week 30 mg after that 60 mg daily  Dispense: 60 capsule; Refill: 0  3. GAD (generalized anxiety disorder)  - ALPRAZolam (ALPRAZOLAM XR) 0.5 MG 24 hr tablet; Take 1 tablet (0.5 mg total) by mouth daily. Fill July 10th and monthly thereafter  Dispense: 30 tablet; Refill: 2 - DULoxetine (CYMBALTA) 30 MG capsule; Take 1-2 capsules (30-60 mg total) by mouth daily. First week 30 mg after that 60 mg daily  Dispense: 60 capsule; Refill: 0  4. Dyslipidemia  - Pitavastatin Calcium (LIVALO) 2 MG TABS; Take 1 tablet (2 mg total) by mouth daily.  Dispense: 90 tablet; Refill: 1 -lipid panel   5. Benign hypertension  At goal  -comp panel   6. Morbid obesity with BMI of 40.0-44.9, adult [facility])  Discussed with the patient the risk posed by an increased BMI. Discussed importance of portion control, calorie counting and at least 150 minutes of physical activity weekly. Avoid sweet beverages and drink more water. Eat at least 6 servings of fruit and vegetables daily   7. Muscle spasms of neck  - baclofen (LIORESAL) 10 MG tablet; Take 1 tablet (10 mg total) by mouth 2 (two) times daily as needed.  Dispense: 30 each; Refill: 0

## 2017-09-29 LAB — COMPLETE METABOLIC PANEL WITH GFR
AG Ratio: 1.2 (calc) (ref 1.0–2.5)
ALKALINE PHOSPHATASE (APISO): 119 U/L (ref 33–130)
ALT: 29 U/L (ref 6–29)
AST: 29 U/L (ref 10–35)
Albumin: 3.7 g/dL (ref 3.6–5.1)
BUN: 18 mg/dL (ref 7–25)
CALCIUM: 9.3 mg/dL (ref 8.6–10.4)
CO2: 30 mmol/L (ref 20–32)
CREATININE: 0.73 mg/dL (ref 0.50–1.05)
Chloride: 102 mmol/L (ref 98–110)
GFR, Est African American: 108 mL/min/{1.73_m2} (ref >=60)
GFR, Est Non African American: 93 mL/min/{1.73_m2} (ref >=60)
GLUCOSE: 198 mg/dL — AB (ref 65–139)
Globulin: 3.1 g/dL (calc) (ref 1.9–3.7)
Potassium: 4.2 mmol/L (ref 3.5–5.3)
Sodium: 139 mmol/L (ref 135–146)
Total Bilirubin: 0.3 mg/dL (ref 0.2–1.2)
Total Protein: 6.8 g/dL (ref 6.1–8.1)

## 2017-09-29 LAB — LIPID PANEL
CHOL/HDL RATIO: 3.2 (calc) (ref <5.0)
CHOLESTEROL: 194 mg/dL (ref <200)
HDL: 60 mg/dL (ref >50)
LDL CHOLESTEROL (CALC): 106 mg/dL — AB
NON-HDL CHOLESTEROL (CALC): 134 mg/dL — AB (ref <130)
TRIGLYCERIDES: 165 mg/dL — AB (ref <150)

## 2017-10-29 DIAGNOSIS — D485 Neoplasm of uncertain behavior of skin: Secondary | ICD-10-CM | POA: Diagnosis not present

## 2017-10-30 ENCOUNTER — Encounter: Payer: Self-pay | Admitting: Family Medicine

## 2017-11-04 ENCOUNTER — Encounter: Payer: Self-pay | Admitting: Family Medicine

## 2017-11-08 ENCOUNTER — Other Ambulatory Visit: Payer: Self-pay | Admitting: Family Medicine

## 2017-11-25 ENCOUNTER — Encounter: Payer: Self-pay | Admitting: Family Medicine

## 2017-12-02 ENCOUNTER — Encounter: Payer: Self-pay | Admitting: Family Medicine

## 2017-12-02 ENCOUNTER — Ambulatory Visit: Payer: BLUE CROSS/BLUE SHIELD | Admitting: Family Medicine

## 2017-12-02 ENCOUNTER — Ambulatory Visit: Payer: Self-pay | Admitting: *Deleted

## 2017-12-02 VITALS — BP 120/70 | HR 80 | Resp 14 | Wt 283.7 lb

## 2017-12-02 DIAGNOSIS — H9202 Otalgia, left ear: Secondary | ICD-10-CM | POA: Diagnosis not present

## 2017-12-02 DIAGNOSIS — R42 Dizziness and giddiness: Secondary | ICD-10-CM | POA: Diagnosis not present

## 2017-12-02 DIAGNOSIS — E1140 Type 2 diabetes mellitus with diabetic neuropathy, unspecified: Secondary | ICD-10-CM

## 2017-12-02 DIAGNOSIS — IMO0002 Reserved for concepts with insufficient information to code with codable children: Secondary | ICD-10-CM

## 2017-12-02 DIAGNOSIS — E1165 Type 2 diabetes mellitus with hyperglycemia: Secondary | ICD-10-CM

## 2017-12-02 DIAGNOSIS — R0981 Nasal congestion: Secondary | ICD-10-CM

## 2017-12-02 MED ORDER — METFORMIN HCL ER 750 MG PO TB24: 1500.0000 mg | ORAL_TABLET | Freq: Every day | ORAL | 0 refills | Status: DC

## 2017-12-02 NOTE — Progress Notes (Signed)
Name: Lindsay Mcdonald   MRN: [id number]    DOB: 06-08-1961   Date:12/02/2017       Progress Note  Subjective  Chief Complaint  Chief Complaint  Patient presents with  . Dizziness    HPI  Vertigo: symptoms started suddenly last night, she states first episode when she got in bed, spinning sensation. Also a severe episode when she got to use bathroom last night, associated with nausea but no vomiting. Had to sleep sitting up on the sofa. No headache, tinnitus or hearing loss, but felt some discomfort on the left ear. She has also noticed nasal congestion and post-nasal drainage. No fever, chills, cough or SOB  DMII: on xultophy 50 units daly but continues to have fasting glucose up to 200, she does not usually checks it fasting, we will resume Metformin and advised to check fasting level that should be below 140 and post-prandially ( 2 hours after a meal) that needs to be below 180. She is willing to try taking Metformin and she will try checking ont he above times so medication can be adjusted.   Anxiety: she stopped taking Duloxetine, weaning off alprazolam, down to 4 days a week. She states she is feeling fine without medication  Patient Active Problem List   Diagnosis Date Noted  . Venous vascular malformations 03/27/2016  . Diabetic peripheral neuropathy associated with type 2 diabetes mellitus ([place]) 09/26/2015  . Non compliance w medication regimen 09/26/2015  . Seasonal allergic rhinitis 09/26/2015  . Muscle spasms of neck 06/19/2015  . Insomnia 06/19/2015  . Osteoarthritis, knee 06/17/2015  . Depression, major, recurrent, mild ([place]) 06/17/2015  . Diabetes mellitus type 2, uncontrolled ([place]) 06/17/2015  . Dyslipidemia 06/17/2015  . Benign hypertension 06/17/2015  . Morbid obesity with BMI of 40.0-44.9, adult ([place]) 06/17/2015  . Vitamin D deficiency 06/17/2015    Past Surgical History:  Procedure Laterality Date  . BREAST REDUCTION SURGERY    . nasal endoscopic    .  REDUCTION MAMMAPLASTY Bilateral 2000  . removal of ovary      Family History  Problem Relation Age of Onset  . Diabetes Mother   . Cancer Mother 31       Breast  . Breast cancer Mother 37  . Diabetes Daughter        Oldest Daughter  . Stroke Maternal Uncle   . Breast cancer Maternal Aunt 5    Social History   Socioeconomic History  . Marital status: Single    Spouse name: Not on file  . Number of children: Not on file  . Years of education: Not on file  . Highest education level: Not on file  Social Needs  . Financial resource strain: Not on file  . Food insecurity - worry: Not on file  . Food insecurity - inability: Not on file  . Transportation needs - medical: Not on file  . Transportation needs - non-medical: Not on file  Occupational History  . Not on file  Tobacco Use  . Smoking status: Current Every Day Smoker    Packs/day: 0.25    Years: 0.50    Pack years: 0.12    Types: E-cigarettes  . Smokeless tobacco: Never Used  Substance and Sexual Activity  . Alcohol use: No    Alcohol/week: 0.0 oz  . Drug use: No  . Sexual activity: Yes    Birth control/protection: None  Other Topics Concern  . Not on file  Social History Narrative   Divorced, she has  a boyfriend   Working at [employer].      Current Outpatient Medications:  .  ALPRAZolam (ALPRAZOLAM XR) 0.5 MG 24 hr tablet, Take 1 tablet (0.5 mg total) by mouth daily. Fill July 10th and monthly thereafter, Disp: 30 tablet, Rfl: 2 .  cholecalciferol (VITAMIN D) 1000 UNITS tablet, Take 1,000 Units by mouth 2 (two) times daily.  , Disp: , Rfl:  .  aspirin 81 MG tablet, Take 81 mg by mouth daily., Disp: , Rfl:  .  baclofen (LIORESAL) 10 MG tablet, Take 1 tablet (10 mg total) by mouth 2 (two) times daily as needed., Disp: 30 each, Rfl: 0 .  Cinnamon 500 MG capsule, Take 500 mg by mouth 2 (two) times daily., Disp: , Rfl:  .  diclofenac sodium (VOLTAREN) 1 % GEL, Apply 2 g topically 4 (four) times daily.,  Disp: 100 g, Rfl: 2 .  Diphenhydramine-APAP, sleep, (TYLENOL PM EXTRA STRENGTH PO), Take by mouth., Disp: , Rfl:  .  fluticasone (FLONASE) 50 MCG/ACT nasal spray, USE 2 SPRAY(S) IN EACH NOSTRIL ONCE DAILY, Disp: 17 g, Rfl: 0 .  glucose blood test strip, Use as instructed, Disp: 100 each, Rfl: 12 .  Insulin Degludec-Liraglutide (XULTOPHY) 100-3.6 UNIT-MG/ML SOPN, Inject 50 Units into the skin daily., Disp: 15 mL, Rfl: 2 .  Insulin Pen Needle 32G X 6 MM MISC, 1 each by Does not apply route daily., Disp: 100 each, Rfl: 1 .  lisinopril-hydrochlorothiazide (PRINZIDE,ZESTORETIC) 20-12.5 MG tablet, Take 1 tablet by mouth daily., Disp: 90 tablet, Rfl: 1 .  magnesium 30 MG tablet, Take 30 mg by mouth., Disp: , Rfl:  .  Multiple Vitamin (MULITIVITAMIN WITH MINERALS) TABS, Take 1 tablet by mouth daily.  , Disp: , Rfl:  .  ONETOUCH DELICA LANCETS FINE MISC, 1 Units by Does not apply route 2 (two) times daily., Disp: 100 each, Rfl: 2 .  Pitavastatin Calcium (LIVALO) 2 MG TABS, Take 1 tablet (2 mg total) by mouth daily. (Patient not taking: Reported on 12/02/2017), Disp: 90 tablet, Rfl: 1  Allergies  Allergen Reactions  . Atorvastatin     and pravastatin caused muscle cramps, also simvastatin  . Baclofen     spasms  . Compazine Other (See Comments)    Twitching and spasms of neck muscles  . Penicillins     rash     ROS  Ten systems reviewed and is negative except as mentioned in HPI   Objective  Vitals:   12/02/17 1319  BP: 120/70  Pulse: 80  Resp: 14  SpO2: 98%  Weight: 283 lb 11.2 oz (128.7 kg)    Body mass index is 47.21 kg/m.  Physical Exam  Constitutional: Patient appears well-developed and well-nourished. Obese No distress.  HEENT: head atraumatic, normocephalic, pupils equal and reactive to light, ears : normal TM bilaterally/partially visualized, neck supple, throat within normal limits, no sinus pressure, boggy turbinates Cardiovascular: Normal rate, regular rhythm and  normal heart sounds.  No murmur heard. No BLE edema. Pulmonary/Chest: Effort normal and breath sounds normal. No respiratory distress. Abdominal: Soft.  There is no tenderness. Psychiatric: Patient has a normal mood and affect. behavior is normal. Judgment and thought content normal. Neurological: normal cranial nerves, normal strength, Romberg negative, normal gait, no nystagmus.   Recent Results (from the past 2160 hour(s))  POCT HgB A1C     Status: Abnormal   Collection Time: 09/28/17  8:50 AM  Result Value Ref Range   Hemoglobin A1C 8.4   COMPLETE METABOLIC  PANEL WITH GFR     Status: Abnormal   Collection Time: 09/28/17  9:16 AM  Result Value Ref Range   Glucose, Bld 198 (H) 65 - 139 mg/dL    Comment: .        Non-fasting reference interval .    BUN 18 7 - 25 mg/dL   Creat 0.73 0.50 - 1.05 mg/dL    Comment: For patients >65 years of age, the reference limit for Creatinine is approximately 13% higher for people identified as African-American. .    GFR, Est Non African American 93 > OR = 60 mL/min/1.67m2   GFR, Est African American 108 > OR = 60 mL/min/1.24m2   BUN/Creatinine Ratio NOT APPLICABLE 6 - 22 (calc)   Sodium 139 135 - 146 mmol/L   Potassium 4.2 3.5 - 5.3 mmol/L   Chloride 102 98 - 110 mmol/L   CO2 30 20 - 32 mmol/L   Calcium 9.3 8.6 - 10.4 mg/dL   Total Protein 6.8 6.1 - 8.1 g/dL   Albumin 3.7 3.6 - 5.1 g/dL   Globulin 3.1 1.9 - 3.7 g/dL (calc)   AG Ratio 1.2 1.0 - 2.5 (calc)   Total Bilirubin 0.3 0.2 - 1.2 mg/dL   Alkaline phosphatase (APISO) 119 33 - 130 U/L   AST 29 10 - 35 U/L   ALT 29 6 - 29 U/L  Lipid panel     Status: Abnormal   Collection Time: 09/28/17  9:16 AM  Result Value Ref Range   Cholesterol 194 <200 mg/dL   HDL 60 >50 mg/dL   Triglycerides 165 (H) <150 mg/dL   LDL Cholesterol (Calc) 106 (H) mg/dL (calc)    Comment: Reference range: <100 . Desirable range <100 mg/dL for primary prevention;   <70 mg/dL for patients with CHD or diabetic  patients  with > or = 2 CHD risk factors. Marland Kitchen LDL-C is now calculated using the Martin-Hopkins  calculation, which is a validated novel method providing  better accuracy than the Friedewald equation in the  estimation of LDL-C.  Cresenciano Genre et al. Annamaria Helling. 4481;856(31): 2061-2068  (http://education.[facility].com/faq/FAQ164)    Total CHOL/HDL Ratio 3.2 <5.0 (calc)   Non-HDL Cholesterol (Calc) 134 (H) <130 mg/dL (calc)    Comment: For patients with diabetes plus 1 major ASCVD risk  factor, treating to a non-HDL-C goal of <100 mg/dL  (LDL-C of <70 mg/dL) is considered a therapeutic  option.      PHQ2/9: Depression screen [facility] 2/9 06/23/2017 04/01/2017 09/24/2016 06/27/2016 03/27/2016  Decreased Interest 0 0 0 0 0  Down, Depressed, Hopeless 0 0 0 0 0  PHQ - 2 Score 0 0 0 0 0     Fall Risk: Fall Risk  12/02/2017 06/23/2017 04/01/2017 12/30/2016 09/24/2016  Falls in the past year? No No No No No    Functional Status Survey: Is the patient deaf or have difficulty hearing?: No Does the patient have difficulty seeing, even when wearing glasses/contacts?: No Does the patient have difficulty concentrating, remembering, or making decisions?: No Does the patient have difficulty walking or climbing stairs?: No Does the patient have difficulty dressing or bathing?: No Does the patient have difficulty doing errands alone such as visiting a doctor's office or shopping?: No   Assessment & Plan  1. Vertigo  - Ambulatory referral to ENT  2. Nasal congestion  Continue nasal steroid  3. Type 2 diabetes, uncontrolled, with neuropathy (HCC)  - metFORMIN (GLUCOPHAGE-XR) 750 MG 24 hr tablet; Take 2 tablets (1,500 mg  total) by mouth daily with breakfast.  Dispense: 180 tablet; Refill: 0  4. Ear discomfort, left  She has mild debris on the ear canal but will hold off on ear lavage since we are sending referral to ENT and she is already dizzy

## 2017-12-02 NOTE — Addendum Note (Signed)
Addended by: Chilton Greathouse on: 12/02/2017 03:36 PM   Modules accepted: Orders

## 2017-12-02 NOTE — Telephone Encounter (Signed)
Experiencing vertigo since last night. Room is spinning, feeling nauseous.  Care advice given. Appointment made for today.  Reason for Disposition . [1] MODERATE dizziness (e.g., vertigo; feels very unsteady, interferes with normal activities) AND [2] has NOT been evaluated by physician for this  Answer Assessment - Initial Assessment Questions 1. DESCRIPTION: "Describe your dizziness."     Room spinning 2. VERTIGO: "Do you feel like either you or the room is spinning or tilting?"      spinning 3. LIGHTHEADED: "Do you feel lightheaded?" (e.g., somewhat faint, woozy, weak upon standing)     Woozy when standing or sitting up 4. SEVERITY: "How bad is it?"  "Can you walk?"   - MILD - Feels unsteady but walking normally.   - MODERATE - Feels very unsteady when walking, but not falling; interferes with normal activities (e.g., school, work) .   - SEVERE - Unable to walk without falling (requires assistance).     Severe last night, now mild right now 5. ONSET:  "When did the dizziness begin?"     Last night 6. AGGRAVATING FACTORS: "Does anything make it worse?" (e.g., standing, change in head position)     Moving around makes it worst 7. CAUSE: "What do you think is causing the dizziness?"     Has sinus problems, lots of pressure and congestion 8. RECURRENT SYMPTOM: "Have you had dizziness before?" If so, ask: "When was the last time?" "What happened that time?"     No the room spinning 9. OTHER SYMPTOMS: "Do you have any other symptoms?" (e.g., headache, weakness, numbness, vomiting, earache)     Weakness last night, nausea and left ear feeling funny 10. PREGNANCY: "Is there any chance you are pregnant?" "When was your last menstrual period?"       LMP many years ago  Protocols used: DIZZINESS - VERTIGO-A-AH

## 2017-12-02 NOTE — Patient Instructions (Signed)

## 2017-12-09 ENCOUNTER — Encounter: Payer: Self-pay | Admitting: Family Medicine

## 2017-12-13 ENCOUNTER — Other Ambulatory Visit: Payer: Self-pay | Admitting: Family Medicine

## 2017-12-13 ENCOUNTER — Encounter: Payer: Self-pay | Admitting: Family Medicine

## 2017-12-13 DIAGNOSIS — I10 Essential (primary) hypertension: Secondary | ICD-10-CM

## 2017-12-14 ENCOUNTER — Other Ambulatory Visit: Payer: Self-pay

## 2017-12-14 ENCOUNTER — Other Ambulatory Visit: Payer: Self-pay | Admitting: Family Medicine

## 2017-12-14 DIAGNOSIS — I10 Essential (primary) hypertension: Secondary | ICD-10-CM

## 2017-12-14 MED ORDER — LISINOPRIL-HYDROCHLOROTHIAZIDE 20-12.5 MG PO TABS: 1.0000 | ORAL_TABLET | Freq: Every day | ORAL | 1 refills | Status: DC

## 2017-12-14 NOTE — Telephone Encounter (Signed)
Refill request for Hypertension medication:  Lisinopril   Last office visit pertaining to hypertension:  BP Readings from Last 3 Encounters:  12/02/17 120/70  09/28/17 110/70  06/23/17 126/84     Lab Results  Component Value Date   CREATININE 0.73 09/28/2017   BUN 18 09/28/2017   NA 139 09/28/2017   K 4.2 09/28/2017   CL 102 09/28/2017   CO2 30 09/28/2017     @AFUTAPPT @

## 2017-12-21 DIAGNOSIS — H6123 Impacted cerumen, bilateral: Secondary | ICD-10-CM | POA: Diagnosis not present

## 2017-12-21 DIAGNOSIS — G4733 Obstructive sleep apnea (adult) (pediatric): Secondary | ICD-10-CM | POA: Diagnosis not present

## 2017-12-21 DIAGNOSIS — J329 Chronic sinusitis, unspecified: Secondary | ICD-10-CM | POA: Diagnosis not present

## 2017-12-31 DIAGNOSIS — D22111 Melanocytic nevi of right upper eyelid, including canthus: Secondary | ICD-10-CM | POA: Diagnosis not present

## 2017-12-31 DIAGNOSIS — D485 Neoplasm of uncertain behavior of skin: Secondary | ICD-10-CM | POA: Diagnosis not present

## 2018-01-04 ENCOUNTER — Encounter: Payer: Self-pay | Admitting: Family Medicine

## 2018-01-04 ENCOUNTER — Ambulatory Visit: Payer: BLUE CROSS/BLUE SHIELD | Admitting: Family Medicine

## 2018-01-04 VITALS — BP 100/70 | HR 94 | Resp 14 | Ht 65.0 in | Wt 284.4 lb

## 2018-01-04 DIAGNOSIS — M62838 Other muscle spasm: Secondary | ICD-10-CM | POA: Diagnosis not present

## 2018-01-04 DIAGNOSIS — F330 Major depressive disorder, recurrent, mild: Secondary | ICD-10-CM | POA: Diagnosis not present

## 2018-01-04 DIAGNOSIS — IMO0002 Reserved for concepts with insufficient information to code with codable children: Secondary | ICD-10-CM

## 2018-01-04 DIAGNOSIS — E1165 Type 2 diabetes mellitus with hyperglycemia: Secondary | ICD-10-CM | POA: Diagnosis not present

## 2018-01-04 DIAGNOSIS — H029 Unspecified disorder of eyelid: Secondary | ICD-10-CM

## 2018-01-04 DIAGNOSIS — E785 Hyperlipidemia, unspecified: Secondary | ICD-10-CM | POA: Diagnosis not present

## 2018-01-04 DIAGNOSIS — I10 Essential (primary) hypertension: Secondary | ICD-10-CM

## 2018-01-04 DIAGNOSIS — E1140 Type 2 diabetes mellitus with diabetic neuropathy, unspecified: Secondary | ICD-10-CM | POA: Diagnosis not present

## 2018-01-04 DIAGNOSIS — F411 Generalized anxiety disorder: Secondary | ICD-10-CM | POA: Diagnosis not present

## 2018-01-04 DIAGNOSIS — Z6841 Body Mass Index (BMI) 40.0 and over, adult: Secondary | ICD-10-CM | POA: Diagnosis not present

## 2018-01-04 LAB — POCT UA - MICROALBUMIN: MICROALBUMIN (UR) POC: 20 mg/L

## 2018-01-04 LAB — POCT GLYCOSYLATED HEMOGLOBIN (HGB A1C): HEMOGLOBIN A1C: 7.9

## 2018-01-04 MED ORDER — METFORMIN HCL ER 750 MG PO TB24: 1500.0000 mg | ORAL_TABLET | Freq: Every day | ORAL | 0 refills | Status: DC

## 2018-01-04 MED ORDER — PITAVASTATIN CALCIUM 2 MG PO TABS: 1.0000 | ORAL_TABLET | Freq: Every day | ORAL | 1 refills | Status: DC

## 2018-01-04 MED ORDER — INSULIN DEGLUDEC-LIRAGLUTIDE 100-3.6 UNIT-MG/ML ~~LOC~~ SOPN: 50.0000 [IU] | PEN_INJECTOR | Freq: Every day | SUBCUTANEOUS | 2 refills | Status: DC

## 2018-01-04 MED ORDER — BACLOFEN 10 MG PO TABS: 10.0000 mg | ORAL_TABLET | Freq: Two times a day (BID) | ORAL | 0 refills | Status: DC | PRN

## 2018-01-04 MED ORDER — LISINOPRIL-HYDROCHLOROTHIAZIDE 10-12.5 MG PO TABS: 1.0000 | ORAL_TABLET | Freq: Every day | ORAL | 0 refills | Status: DC

## 2018-01-04 NOTE — Progress Notes (Addendum)
Name: Lindsay Mcdonald   MRN: [id number]    DOB: [birth year]   Date:01/04/2018       Progress Note  Subjective  Chief Complaint  Chief Complaint  Patient presents with  . Diabetes  . Depression  . Anxiety  . Hypertension    HPI  DMII: on xultophy 50 units daily but continues and glucose was in the 200's in Dec so we resumed Metformin 1500 mg daily, glucose is down to 150's, 170's post-prandially. hbgA1C down from 8.8%, to 7.9%. She states no recent symptoms of neuropathy, doing better at this time, had eye exam recently and we will a copy of results, on ARB but last urine micro was normal , due for repeat today  HTN: taking lisinopril/HCTZ without any side effects, bp is at goal, denies chest pain, palpitation, denies SOB. She felt light headed yesterday, bp today is low we will decrease dose of lisinopril/hctz and monitor.   Hyperlipidemia; she has increased compliance with Livalo, LDL is at goal   Vascular Malformation: she states she has a group of vessels on right outer thigh, that she noticed her 32 's, not growing or painful,  She had an exam done by vascular surgeon,decided not to do anything about it at this time  Major Depression with anxiety: she took Zoloft in the past, states Cymbalta caused "weird feeling ", she was afraid to start Lexapro because of side effects. She is also worried about depending on SSRI, but not bothered by Alprazolam XR. Explained that BZD's are controlled and she should try to start SSRI to be able to come off BZD.Marland Kitchen She states she is feeling better, no longer having crying spells, able to focus at work again, mother moved to an independent living facility. She denies suicidal thoughts or ideation.    Morbid Obesity:weight is stable since Dec 2018, however she has gained some weight over the past 6 months, she thinks related to work stress, but has moved and has to go use stairs, she will try to cut down on portion size .  Patient Active Problem List    Diagnosis Date Noted  . Venous vascular malformations 03/27/2016  . Diabetic peripheral neuropathy associated with type 2 diabetes mellitus ([place]) 09/26/2015  . Non compliance w medication regimen 09/26/2015  . Seasonal allergic rhinitis 09/26/2015  . Muscle spasms of neck 06/19/2015  . Insomnia 06/19/2015  . Osteoarthritis, knee 06/17/2015  . Depression, major, recurrent, mild ([place]) 06/17/2015  . Diabetes mellitus type 2, uncontrolled ([place]) 06/17/2015  . Dyslipidemia 06/17/2015  . Benign hypertension 06/17/2015  . Morbid obesity with BMI of 40.0-44.9, adult ([place]) 06/17/2015  . Vitamin D deficiency 06/17/2015    Past Surgical History:  Procedure Laterality Date  . BREAST REDUCTION SURGERY    . nasal endoscopic    . REDUCTION MAMMAPLASTY Bilateral 2000  . removal of ovary      Family History  Problem Relation Age of Onset  . Diabetes Mother   . Cancer Mother 63       Breast  . Breast cancer Mother 68  . Diabetes Daughter        Oldest Daughter  . Stroke Maternal Uncle   . Breast cancer Maternal Aunt 17    Social History   Socioeconomic History  . Marital status: Single    Spouse name: Not on file  . Number of children: Not on file  . Years of education: Not on file  . Highest education level: Not on file  Social Needs  .  Financial resource strain: Not on file  . Food insecurity - worry: Not on file  . Food insecurity - inability: Not on file  . Transportation needs - medical: Not on file  . Transportation needs - non-medical: Not on file  Occupational History  . Not on file  Tobacco Use  . Smoking status: Current Every Day Smoker    Packs/day: 0.25    Years: 0.50    Pack years: 0.12    Types: E-cigarettes  . Smokeless tobacco: Never Used  Substance and Sexual Activity  . Alcohol use: No    Alcohol/week: 0.0 oz  . Drug use: No  . Sexual activity: Yes    Birth control/protection: None  Other Topics Concern  . Not on file  Social History Narrative    Divorced, she has a boyfriend   Working at [employer].      Current Outpatient Medications:  .  ALPRAZolam (ALPRAZOLAM XR) 0.5 MG 24 hr tablet, Take 1 tablet (0.5 mg total) by mouth daily. Fill July 10th and monthly thereafter, Disp: 30 tablet, Rfl: 2 .  aspirin 81 MG tablet, Take 81 mg by mouth daily., Disp: , Rfl:  .  baclofen (LIORESAL) 10 MG tablet, Take 1 tablet (10 mg total) by mouth 2 (two) times daily as needed., Disp: 30 each, Rfl: 0 .  cholecalciferol (VITAMIN D) 1000 UNITS tablet, Take 1,000 Units by mouth 2 (two) times daily.  , Disp: , Rfl:  .  Cinnamon 500 MG capsule, Take 500 mg by mouth 2 (two) times daily., Disp: , Rfl:  .  diclofenac sodium (VOLTAREN) 1 % GEL, Apply 2 g topically 4 (four) times daily., Disp: 100 g, Rfl: 2 .  Diphenhydramine-APAP, sleep, (TYLENOL PM EXTRA STRENGTH PO), Take by mouth., Disp: , Rfl:  .  erythromycin ophthalmic ointment, Place 1 g into the right eye 4 (four) times daily., Disp: , Rfl:  .  fluticasone (FLONASE) 50 MCG/ACT nasal spray, USE 2 SPRAY(S) IN EACH NOSTRIL ONCE DAILY, Disp: 17 g, Rfl: 0 .  glucose blood test strip, Use as instructed, Disp: 100 each, Rfl: 12 .  Insulin Degludec-Liraglutide (XULTOPHY) 100-3.6 UNIT-MG/ML SOPN, Inject 50 Units into the skin daily., Disp: 15 mL, Rfl: 2 .  Insulin Pen Needle 32G X 6 MM MISC, 1 each by Does not apply route daily., Disp: 100 each, Rfl: 1 .  lisinopril-hydrochlorothiazide (PRINZIDE,ZESTORETIC) 20-12.5 MG tablet, Take 1 tablet by mouth daily., Disp: 90 tablet, Rfl: 1 .  magnesium 30 MG tablet, Take 30 mg by mouth., Disp: , Rfl:  .  metFORMIN (GLUCOPHAGE-XR) 750 MG 24 hr tablet, Take 2 tablets (1,500 mg total) by mouth daily with breakfast., Disp: 180 tablet, Rfl: 0 .  montelukast (SINGULAIR) 10 MG tablet, Take 1 tablet by mouth daily., Disp: , Rfl:  .  Multiple Vitamin (MULITIVITAMIN WITH MINERALS) TABS, Take 1 tablet by mouth daily.  , Disp: , Rfl:  .  ONETOUCH DELICA LANCETS FINE MISC, 1  Units by Does not apply route 2 (two) times daily., Disp: 100 each, Rfl: 2 .  Pitavastatin Calcium (LIVALO) 2 MG TABS, Take 1 tablet (2 mg total) by mouth daily., Disp: 90 tablet, Rfl: 1  Allergies  Allergen Reactions  . Atorvastatin     and pravastatin caused muscle cramps, also simvastatin  . Baclofen     spasms  . Compazine Other (See Comments)    Twitching and spasms of neck muscles  . Penicillins     rash  ROS  Constitutional: Negative for fever or weight change.  Respiratory: Mild intermittent  cough ( from post-nasal drainage), but denies  shortness of breath.   Cardiovascular: Negative for chest pain or palpitations.  Gastrointestinal: Negative for abdominal pain, positive for mild  bowel changes - since re-started Metformin a few weeks ago .  Musculoskeletal: Negative for gait problem , positive for intermittent right knee joint swelling.  Skin: Negative for rash.  Neurological: Positive for mild intermittent dizziness but no  headache.  No other specific complaints in a complete review of systems (except as listed in HPI above).  Objective  Vitals:   01/04/18 0757  BP: 100/70  Pulse: 94  Resp: 14  SpO2: 98%  Weight: 284 lb 6.4 oz (129 kg)  Height: 5\' 5"  (1.651 m)    Body mass index is 47.33 kg/m.  Physical Exam  Constitutional: Patient appears well-developed and well-nourished. Obese No distress.  HEENT: head atraumatic, normocephalic, pupils equal and reactive to light,neck supple, throat within normal limits Cardiovascular: Normal rate, regular rhythm and normal heart sounds.  No murmur heard. No BLE edema. Pulmonary/Chest: Effort normal and breath sounds normal. No respiratory distress. Abdominal: Soft.  There is no tenderness. Psychiatric: Patient has a normal mood and affect. behavior is normal. Judgment and thought content normal.  Recent Results (from the past 2160 hour(s))  POCT HgB A1C     Status: Abnormal   Collection Time: 01/04/18  8:02 AM   Result Value Ref Range   Hemoglobin A1C 7.9       PHQ2/9: Depression screen [facility] 2/9 01/04/2018 06/23/2017 04/01/2017 09/24/2016 06/27/2016  Decreased Interest 0 0 0 0 0  Down, Depressed, Hopeless 1 0 0 0 0  PHQ - 2 Score 1 0 0 0 0  Altered sleeping 1 - - - -  Tired, decreased energy 1 - - - -  Change in appetite 1 - - - -  Feeling bad or failure about yourself  0 - - - -  Trouble concentrating 0 - - - -  Moving slowly or fidgety/restless 1 - - - -  Suicidal thoughts 0 - - - -  PHQ-9 Score 5 - - - -  Difficult doing work/chores Somewhat difficult - - - -     Fall Risk: Fall Risk  01/04/2018 12/02/2017 06/23/2017 04/01/2017 12/30/2016  Falls in the past year? No No No No No     Functional Status Survey: Is the patient deaf or have difficulty hearing?: No Does the patient have difficulty seeing, even when wearing glasses/contacts?: No Does the patient have difficulty concentrating, remembering, or making decisions?: No Does the patient have difficulty walking or climbing stairs?: No Does the patient have difficulty dressing or bathing?: No Does the patient have difficulty doing errands alone such as visiting a doctor's office or shopping?: No   Assessment & Plan  1. Type 2 diabetes, uncontrolled, with neuropathy (HCC)  - POCT HgB A1C - metFORMIN (GLUCOPHAGE-XR) 750 MG 24 hr tablet; Take 2 tablets (1,500 mg total) by mouth daily with breakfast.  Dispense: 180 tablet; Refill: 0 - Insulin Degludec-Liraglutide (XULTOPHY) 100-3.6 UNIT-MG/ML SOPN; Inject 50 Units into the skin daily.  Dispense: 15 mL; Refill: 2 -Urine micro POTC  2. Muscle spasms of neck  - baclofen (LIORESAL) 10 MG tablet; Take 1 tablet (10 mg total) by mouth 2 (two) times daily as needed.  Dispense: 30 each; Refill: 0  3. Dyslipidemia  - Pitavastatin Calcium (LIVALO) 2 MG TABS; Take 1 tablet (2  mg total) by mouth daily.  Dispense: 90 tablet; Refill: 1  4. Depression, major, recurrent, mild ([place])  She refuses  SSRI, still depressed, but coping   5. Morbid obesity with BMI of 40.0-44.9, adult [facility])  Discussed with the patient the risk posed by an increased BMI. Discussed importance of portion control, calorie counting and at least 150 minutes of physical activity weekly. Avoid sweet beverages and drink more water. Eat at least 6 servings of fruit and vegetables daily   6. GAD (generalized anxiety disorder)  Taking alprazolam prn only, about 3 days a week and still has rx at home  7. Lesion of right eyelid  Recent had eye surgery   8. Benign hypertension  - lisinopril-hydrochlorothiazide (PRINZIDE,ZESTORETIC) 10-12.5 MG tablet; Take 1 tablet by mouth daily.  Dispense: 90 tablet; Refill: 0

## 2018-01-04 NOTE — Addendum Note (Signed)
Addended by: Steele Sizer F on: 01/04/2018 08:25 AM   Modules accepted: Orders

## 2018-01-06 ENCOUNTER — Encounter: Payer: Self-pay | Admitting: Family Medicine

## 2018-01-08 ENCOUNTER — Encounter: Payer: Self-pay | Admitting: Family Medicine

## 2018-01-20 ENCOUNTER — Encounter: Payer: Self-pay | Admitting: Family Medicine

## 2018-01-21 NOTE — Telephone Encounter (Signed)
Not my patient

## 2018-01-22 ENCOUNTER — Encounter: Payer: Self-pay | Admitting: Family Medicine

## 2018-01-25 NOTE — Telephone Encounter (Signed)
You can message me anytime

## 2018-01-28 ENCOUNTER — Encounter: Payer: Self-pay | Admitting: Family Medicine

## 2018-01-28 ENCOUNTER — Ambulatory Visit: Payer: BLUE CROSS/BLUE SHIELD | Admitting: Family Medicine

## 2018-01-28 VITALS — BP 118/68 | HR 115 | Temp 98.7°F | Resp 18 | Ht 65.0 in | Wt 280.7 lb

## 2018-01-28 DIAGNOSIS — J0101 Acute recurrent maxillary sinusitis: Secondary | ICD-10-CM

## 2018-01-28 DIAGNOSIS — F411 Generalized anxiety disorder: Secondary | ICD-10-CM

## 2018-01-28 DIAGNOSIS — J069 Acute upper respiratory infection, unspecified: Secondary | ICD-10-CM

## 2018-01-28 MED ORDER — ALPRAZOLAM ER 0.5 MG PO TB24: 0.5000 mg | ORAL_TABLET | Freq: Every day | ORAL | 2 refills | Status: DC

## 2018-01-28 MED ORDER — AZITHROMYCIN 500 MG PO TABS: 500.0000 mg | ORAL_TABLET | Freq: Every day | ORAL | 0 refills | Status: DC

## 2018-01-28 NOTE — Progress Notes (Signed)
Name: Lindsay Mcdonald   MRN: [id number]    DOB: Nov 28, 1961   Date:01/28/2018       Progress Note  Subjective  Chief Complaint  Chief Complaint  Patient presents with  . Sore Throat    Onset-1 week, nasal congestion, sore throat off and on, body ache, both ear pressure has a little ear pressure.   . Nasal Congestion  . Anxiety    Would like to discuss other medication options.    HPI  URI: she has been sick for the past week with rhinorrhea, nasal congestion, body aches, mild ear pressure, sore throat, no fever, chills or fatigue. She states has a weak gag reflex and feels like vomiting when blowing her nose. Rhinorrhea goes from clear to green. She has some facial pressure on left side. She has been taking otc ibuprofen and flonase. She has post-nasal drainage and the tickle on her throat makes her cough, no SOB or wheezing.   GAD: she states she has been trying to wean self off alprazolam, skipping days, but would like to get a refill of medication now, feels like she needs to take it most days again.   Patient Active Problem List   Diagnosis Date Noted  . Venous vascular malformations 03/27/2016  . Diabetic peripheral neuropathy associated with type 2 diabetes mellitus ([place]) 09/26/2015  . Non compliance w medication regimen 09/26/2015  . Seasonal allergic rhinitis 09/26/2015  . Muscle spasms of neck 06/19/2015  . Insomnia 06/19/2015  . Osteoarthritis, knee 06/17/2015  . Depression, major, recurrent, mild ([place]) 06/17/2015  . Diabetes mellitus type 2, uncontrolled ([place]) 06/17/2015  . Dyslipidemia 06/17/2015  . Benign hypertension 06/17/2015  . Morbid obesity with BMI of 40.0-44.9, adult ([place]) 06/17/2015  . Vitamin D deficiency 06/17/2015    Social History   Tobacco Use  . Smoking status: Current Every Day Smoker    Packs/day: 0.25    Years: 0.50    Pack years: 0.12    Types: E-cigarettes  . Smokeless tobacco: Never Used  Substance Use Topics  . Alcohol use: No   Alcohol/week: 0.0 oz     Current Outpatient Medications:  .  ALPRAZolam (ALPRAZOLAM XR) 0.5 MG 24 hr tablet, Take 1 tablet (0.5 mg total) by mouth daily., Disp: 30 tablet, Rfl: 2 .  aspirin 81 MG tablet, Take 81 mg by mouth daily., Disp: , Rfl:  .  baclofen (LIORESAL) 10 MG tablet, Take 1 tablet (10 mg total) by mouth 2 (two) times daily as needed., Disp: 30 each, Rfl: 0 .  cholecalciferol (VITAMIN D) 1000 UNITS tablet, Take 1,000 Units by mouth 2 (two) times daily.  , Disp: , Rfl:  .  Cinnamon 500 MG capsule, Take 500 mg by mouth 2 (two) times daily., Disp: , Rfl:  .  diclofenac sodium (VOLTAREN) 1 % GEL, Apply 2 g topically 4 (four) times daily., Disp: 100 g, Rfl: 2 .  Diphenhydramine-APAP, sleep, (TYLENOL PM EXTRA STRENGTH PO), Take by mouth., Disp: , Rfl:  .  erythromycin ophthalmic ointment, Place 1 g into the right eye 4 (four) times daily., Disp: , Rfl:  .  fluticasone (FLONASE) 50 MCG/ACT nasal spray, USE 2 SPRAY(S) IN EACH NOSTRIL ONCE DAILY, Disp: 17 g, Rfl: 0 .  glucose blood test strip, Use as instructed, Disp: 100 each, Rfl: 12 .  Insulin Degludec-Liraglutide (XULTOPHY) 100-3.6 UNIT-MG/ML SOPN, Inject 50 Units into the skin daily., Disp: 15 mL, Rfl: 2 .  Insulin Pen Needle 32G X 6 MM MISC, 1 each  by Does not apply route daily., Disp: 100 each, Rfl: 1 .  lisinopril-hydrochlorothiazide (PRINZIDE,ZESTORETIC) 10-12.5 MG tablet, Take 1 tablet by mouth daily., Disp: 90 tablet, Rfl: 0 .  magnesium 30 MG tablet, Take 30 mg by mouth., Disp: , Rfl:  .  metFORMIN (GLUCOPHAGE-XR) 750 MG 24 hr tablet, Take 2 tablets (1,500 mg total) by mouth daily with breakfast., Disp: 180 tablet, Rfl: 0 .  montelukast (SINGULAIR) 10 MG tablet, Take 1 tablet by mouth daily., Disp: , Rfl:  .  Multiple Vitamin (MULITIVITAMIN WITH MINERALS) TABS, Take 1 tablet by mouth daily.  , Disp: , Rfl:  .  ONETOUCH DELICA LANCETS FINE MISC, 1 Units by Does not apply route 2 (two) times daily., Disp: 100 each, Rfl: 2 .   Pitavastatin Calcium (LIVALO) 2 MG TABS, Take 1 tablet (2 mg total) by mouth daily., Disp: 90 tablet, Rfl: 1  Allergies  Allergen Reactions  . Atorvastatin     and pravastatin caused muscle cramps, also simvastatin  . Baclofen     spasms  . Compazine Other (See Comments)    Twitching and spasms of neck muscles  . Penicillins     rash    ROS  Ten systems reviewed and is negative except as mentioned in HPI   Objective  Vitals:   01/28/18 1112  BP: 118/68  Pulse: (!) 115  Resp: 18  Temp: 98.7 F (37.1 C)  TempSrc: Oral  SpO2: 98%  Weight: 280 lb 11.2 oz (127.3 kg)  Height: 5\' 5"  (1.651 m)    Body mass index is 46.71 kg/m.    Physical Exam  Constitutional: Patient appears well-developed and well-nourished. Obese  No distress.  HEENT: head atraumatic, normocephalic, pupils equal and reactive to light, ears : normal TM bilaterally, tender during percussion of left maxillary sinus more than other sinus , neck supple, throat within normal limits Cardiovascular: Normal rate, regular rhythm and normal heart sounds.  No murmur heard. No BLE edema. Pulmonary/Chest: Effort normal and breath sounds normal. No respiratory distress. Abdominal: Soft.  There is no tenderness. Psychiatric: Patient has a normal mood and affect. behavior is normal. Judgment and thought content normal.  Recent Results (from the past 2160 hour(s))  POCT HgB A1C     Status: Abnormal   Collection Time: 01/04/18  8:02 AM  Result Value Ref Range   Hemoglobin A1C 7.9   POCT UA - Microalbumin     Status: Normal   Collection Time: 01/04/18  8:30 AM  Result Value Ref Range   Microalbumin Ur, POC 20 mg/L   Creatinine, POC  mg/dL   Albumin/Creatinine Ratio, Urine, POC       Assessment & Plan  1. Acute URI  Rest, fluids, may continue otc medication, add saline spray   2. GAD (generalized anxiety disorder)  - ALPRAZolam (ALPRAZOLAM XR) 0.5 MG 24 hr tablet; Take 1 tablet (0.5 mg total) by mouth  daily.  Dispense: 30 tablet; Refill: 2  3. Acute recurrent maxillary sinusitis  Previous history of sinus surgery x3 and symptoms worse on left maxillary sinus, we will give her antibiotics to fill it if no improvement of symptoms in the next few days  - azithromycin (ZITHROMAX) 500 MG tablet; Take 1 tablet (500 mg total) by mouth daily.  Dispense: 3 tablet; Refill: 0

## 2018-02-10 DIAGNOSIS — R112 Nausea with vomiting, unspecified: Secondary | ICD-10-CM | POA: Diagnosis not present

## 2018-02-10 DIAGNOSIS — F17210 Nicotine dependence, cigarettes, uncomplicated: Secondary | ICD-10-CM | POA: Diagnosis not present

## 2018-02-10 DIAGNOSIS — R197 Diarrhea, unspecified: Secondary | ICD-10-CM | POA: Diagnosis not present

## 2018-02-10 DIAGNOSIS — M542 Cervicalgia: Secondary | ICD-10-CM | POA: Diagnosis not present

## 2018-02-10 DIAGNOSIS — R51 Headache: Secondary | ICD-10-CM | POA: Diagnosis not present

## 2018-02-10 DIAGNOSIS — Z79899 Other long term (current) drug therapy: Secondary | ICD-10-CM | POA: Diagnosis not present

## 2018-03-03 NOTE — Addendum Note (Signed)
Addended by: Chilton Greathouse on: 03/03/2018 09:13 AM   Modules accepted: Orders

## 2018-03-17 ENCOUNTER — Telehealth: Payer: Self-pay

## 2018-03-17 NOTE — Telephone Encounter (Signed)
She needs follow up for refills, by April, I can send one month supply. See if she can come in during Spring break

## 2018-03-17 NOTE — Telephone Encounter (Signed)
Copied from [place] [id number]. Topic: Quick Communication - Rx Refill/Question >> Mar 17, 2018 12:18 PM Oliver Pila B wrote: Pt would like to know if she cant maker her appt tomorrow or for a couple of months, can she get a refill on her medications, contact pt to advise

## 2018-03-18 ENCOUNTER — Ambulatory Visit: Payer: BLUE CROSS/BLUE SHIELD | Admitting: Family Medicine

## 2018-03-18 ENCOUNTER — Encounter: Payer: Self-pay | Admitting: Family Medicine

## 2018-03-18 VITALS — BP 120/82 | HR 79 | Resp 14 | Ht 65.0 in | Wt 284.5 lb

## 2018-03-18 DIAGNOSIS — F330 Major depressive disorder, recurrent, mild: Secondary | ICD-10-CM | POA: Diagnosis not present

## 2018-03-18 DIAGNOSIS — L810 Postinflammatory hyperpigmentation: Secondary | ICD-10-CM | POA: Diagnosis not present

## 2018-03-18 DIAGNOSIS — E1142 Type 2 diabetes mellitus with diabetic polyneuropathy: Secondary | ICD-10-CM | POA: Diagnosis not present

## 2018-03-18 DIAGNOSIS — E1140 Type 2 diabetes mellitus with diabetic neuropathy, unspecified: Secondary | ICD-10-CM

## 2018-03-18 DIAGNOSIS — E1165 Type 2 diabetes mellitus with hyperglycemia: Secondary | ICD-10-CM

## 2018-03-18 DIAGNOSIS — I10 Essential (primary) hypertension: Secondary | ICD-10-CM

## 2018-03-18 DIAGNOSIS — F411 Generalized anxiety disorder: Secondary | ICD-10-CM

## 2018-03-18 DIAGNOSIS — E785 Hyperlipidemia, unspecified: Secondary | ICD-10-CM

## 2018-03-18 DIAGNOSIS — IMO0002 Reserved for concepts with insufficient information to code with codable children: Secondary | ICD-10-CM

## 2018-03-18 MED ORDER — INSULIN DEGLUDEC-LIRAGLUTIDE 100-3.6 UNIT-MG/ML ~~LOC~~ SOPN: 50.0000 [IU] | PEN_INJECTOR | Freq: Every day | SUBCUTANEOUS | 2 refills | Status: DC

## 2018-03-18 MED ORDER — METFORMIN HCL ER 750 MG PO TB24: 1500.0000 mg | ORAL_TABLET | Freq: Every day | ORAL | 0 refills | Status: DC

## 2018-03-18 MED ORDER — LISINOPRIL-HYDROCHLOROTHIAZIDE 10-12.5 MG PO TABS: 1.0000 | ORAL_TABLET | Freq: Every day | ORAL | 0 refills | Status: DC

## 2018-03-18 NOTE — Progress Notes (Signed)
Name: Lindsay Mcdonald   MRN: [id number]    DOB: 1961-06-23   Date:03/18/2018       Progress Note  Subjective  Chief Complaint  Chief Complaint  Patient presents with  . Diabetes  . Hypertension  . Back Pain  . Skin Discoloration    on her lips as well as dryness.    HPI  DMII: on xultophy 50 units daily but continues and glucose was in the 200's in Dec so we resumed Metformin 1500 mg daily, glucose is not being checked as often, last time she checked post-prandially it was 180. hbgA1C down from 8.8%, to 7.9%, too soon to recheck it today. She will return in a couple of weeks for labs. She states no recent symptoms of neuropathy, doing better at this time,on ARB but last urine micro was normal  HTN: taking lisinopril/HCTZ without any side effects, bp is at goal, denies chest pain, she has occasional palpitation, but no dizziness or decrease in exercise tolerance.   Hyperlipidemia; she has increased compliance with L[place]o, we will recheck labs   Vascular Malformation: she states she has a group of vessels on right outer thigh, that she noticed her 48 's, not growing or painful, She had an exam done by vascular surgeon,decided not to do anything about it at this time. Unchanged   Major Depression with anxiety: she took Zoloft in the past, states Cymbalta caused "weird feeling ", she was afraid to start Lexapro because of side effects. She is also worried about depending on SSRI, but not bothered by Alprazolam XR. Explained that BZD's are controlled and she should try to start SSRI to be able to come off BZD. She states she is feeling better, no longer having crying spells, able to focus at work again, mother moved to an independent living facility. She denies suicidal thoughts or ideation. She states medication helps her a lot, daughter was very sick recently and she was able to cope with it.     Morbid Obesity:weight is stable since Dec 2018, however she has gained some weight over the  past 6 months, she thinks related to work stress, but has moved and has to go use stairs, weight is stable.   Hyperpigmentation: lips were chapped and that has resolved, but now lips are dark and she is worried about, given reassurance, advised to only use vaseline chapstick   Patient Active Problem List   Diagnosis Date Noted  . Venous vascular malformations 03/27/2016  . Diabetic peripheral neuropathy associated with type 2 diabetes mellitus ([place]) 09/26/2015  . Non compliance w medication regimen 09/26/2015  . Seasonal allergic rhinitis 09/26/2015  . Muscle spasms of neck 06/19/2015  . Insomnia 06/19/2015  . Osteoarthritis, knee 06/17/2015  . Depression, major, recurrent, mild ([place]) 06/17/2015  . Diabetes mellitus type 2, uncontrolled ([place]) 06/17/2015  . Dyslipidemia 06/17/2015  . Benign hypertension 06/17/2015  . Morbid obesity with BMI of 40.0-44.9, adult ([place]) 06/17/2015  . Vitamin D deficiency 06/17/2015    Past Surgical History:  Procedure Laterality Date  . BREAST REDUCTION SURGERY    . nasal endoscopic    . REDUCTION MAMMAPLASTY Bilateral 2000  . removal of ovary      Family History  Problem Relation Age of Onset  . Diabetes Mother   . Cancer Mother 89       Breast  . Breast cancer Mother 28  . Diabetes Daughter        Oldest Daughter  . Stroke Maternal Uncle   .  Breast cancer Maternal Aunt 59    Social History   Socioeconomic History  . Marital status: Single    Spouse name: Not on file  . Number of children: Not on file  . Years of education: Not on file  . Highest education level: Not on file  Occupational History  . Not on file  Social Needs  . Financial resource strain: Not on file  . Food insecurity:    Worry: Not on file    Inability: Not on file  . Transportation needs:    Medical: Not on file    Non-medical: Not on file  Tobacco Use  . Smoking status: Current Every Day Smoker    Packs/day: 0.25    Years: 0.50    Pack years: 0.12     Types: E-cigarettes  . Smokeless tobacco: Never Used  Substance and Sexual Activity  . Alcohol use: No    Alcohol/week: 0.0 oz  . Drug use: No  . Sexual activity: Yes    Birth control/protection: None  Lifestyle  . Physical activity:    Days per week: Not on file    Minutes per session: Not on file  . Stress: Not on file  Relationships  . Social connections:    Talks on phone: Not on file    Gets together: Not on file    Attends religious service: Not on file    Active member of club or organization: Not on file    Attends meetings of clubs or organizations: Not on file    Relationship status: Not on file  . Intimate partner violence:    Fear of current or ex partner: Not on file    Emotionally abused: Not on file    Physically abused: Not on file    Forced sexual activity: Not on file  Other Topics Concern  . Not on file  Social History Narrative   Divorced, she has a boyfriend   Working at [employer].      Current Outpatient Medications:  .  ALPRAZolam (ALPRAZOLAM XR) 0.5 MG 24 hr tablet, Take 1 tablet (0.5 mg total) by mouth daily., Disp: 30 tablet, Rfl: 2 .  aspirin 81 MG tablet, Take 81 mg by mouth daily., Disp: , Rfl:  .  cholecalciferol (VITAMIN D) 1000 UNITS tablet, Take 1,000 Units by mouth 2 (two) times daily.  , Disp: , Rfl:  .  diclofenac sodium (VOLTAREN) 1 % GEL, Apply 2 g topically 4 (four) times daily., Disp: 100 g, Rfl: 2 .  Diphenhydramine-APAP, sleep, (TYLENOL PM EXTRA STRENGTH PO), Take by mouth., Disp: , Rfl:  .  fluticasone (FLONASE) 50 MCG/ACT nasal spray, USE 2 SPRAY(S) IN EACH NOSTRIL ONCE DAILY, Disp: 17 g, Rfl: 0 .  glucose blood test strip, Use as instructed, Disp: 100 each, Rfl: 12 .  Insulin Degludec-Liraglutide (XULTOPHY) 100-3.6 UNIT-MG/ML SOPN, Inject 50 Units into the skin daily., Disp: 15 mL, Rfl: 2 .  Insulin Pen Needle 32G X 6 MM MISC, 1 each by Does not apply route daily., Disp: 100 each, Rfl: 1 .  lisinopril-hydrochlorothiazide  (PRINZIDE,ZESTORETIC) 10-12.5 MG tablet, Take 1 tablet by mouth daily., Disp: 90 tablet, Rfl: 0 .  magnesium 30 MG tablet, Take 30 mg by mouth., Disp: , Rfl:  .  metFORMIN (GLUCOPHAGE-XR) 750 MG 24 hr tablet, Take 2 tablets (1,500 mg total) by mouth daily with breakfast., Disp: 180 tablet, Rfl: 0 .  montelukast (SINGULAIR) 10 MG tablet, Take 1 tablet by mouth daily., Disp: ,  Rfl:  .  Multiple Vitamin (MULITIVITAMIN WITH MINERALS) TABS, Take 1 tablet by mouth daily.  , Disp: , Rfl:  .  ONETOUCH DELICA LANCETS FINE MISC, 1 Units by Does not apply route 2 (two) times daily., Disp: 100 each, Rfl: 2 .  Pitavastatin Calcium (LIVALO) 2 MG TABS, Take 1 tablet (2 mg total) by mouth daily., Disp: 90 tablet, Rfl: 1 .  Cinnamon 500 MG capsule, Take 500 mg by mouth 2 (two) times daily., Disp: , Rfl:  .  erythromycin ophthalmic ointment, Place 1 g into the right eye 4 (four) times daily., Disp: , Rfl:   Allergies  Allergen Reactions  . Atorvastatin     and pravastatin caused muscle cramps, also simvastatin  . Baclofen     spasms  . Compazine Other (See Comments)    Twitching and spasms of neck muscles  . Penicillins     rash     ROS  Constitutional: Negative for fever or weight change.  Respiratory: Negative for cough and shortness of breath.   Cardiovascular: Negative for chest pain or palpitations.  Gastrointestinal: Negative for abdominal pain, no bowel changes.  Musculoskeletal: Negative for gait problem or joint swelling.  Skin: Negative for rash. hyperpigmentation lip Neurological: Negative for dizziness or headache.  No other specific complaints in a complete review of systems (except as listed in HPI above).  Objective  Vitals:   03/18/18 1137  BP: 120/82  Pulse: 79  Resp: 14  SpO2: 98%  Weight: 284 lb 8 oz (129 kg)  Height: 5\' 5"  (1.651 m)    Body mass index is 47.34 kg/m.  Physical Exam  Constitutional: Patient appears well-developed and well-nourished. Obese No  distress.  HEENT: head atraumatic, normocephalic, pupils equal and reactive to light,  neck supple, throat within normal limits Cardiovascular: Normal rate, regular rhythm and normal heart sounds.  No murmur heard. No BLE edema. Pulmonary/Chest: Effort normal and breath sounds normal. No respiratory distress. Abdominal: Soft.  There is no tenderness. Psychiatric: Patient has a normal mood and affect. behavior is normal. Judgment and thought content normal. Skin: hyperpigmentation of lips   Recent Results (from the past 2160 hour(s))  POCT HgB A1C     Status: Abnormal   Collection Time: 01/04/18  8:02 AM  Result Value Ref Range   Hemoglobin A1C 7.9   POCT UA - Microalbumin     Status: Normal   Collection Time: 01/04/18  8:30 AM  Result Value Ref Range   Microalbumin Ur, POC 20 mg/L   Creatinine, POC  mg/dL   Albumin/Creatinine Ratio, Urine, POC      PHQ2/9: Depression screen [facility] 2/9 01/04/2018 06/23/2017 04/01/2017 09/24/2016 06/27/2016  Decreased Interest 0 0 0 0 0  Down, Depressed, Hopeless 1 0 0 0 0  PHQ - 2 Score 1 0 0 0 0  Altered sleeping 1 - - - -  Tired, decreased energy 1 - - - -  Change in appetite 1 - - - -  Feeling bad or failure about yourself  0 - - - -  Trouble concentrating 0 - - - -  Moving slowly or fidgety/restless 1 - - - -  Suicidal thoughts 0 - - - -  PHQ-9 Score 5 - - - -  Difficult doing work/chores Somewhat difficult - - - -    Fall Risk: Fall Risk  03/18/2018 01/04/2018 12/02/2017 06/23/2017 04/01/2017  Falls in the past year? No No No No No    FunctionalStatus Survey: Is the  patient deaf or have difficulty hearing?: No Does the patient have difficulty seeing, even when wearing glasses/contacts?: No Does the patient have difficulty concentrating, remembering, or making decisions?: No Does the patient have difficulty walking or climbing stairs?: No Does the patient have difficulty dressing or bathing?: No Does the patient have difficulty doing errands alone  such as visiting a doctor's office or shopping?: No   Assessment & Plan  1. Diabetic peripheral neuropathy associated with type 2 diabetes mellitus (HCC)  - POCT HgB A1C - metFORMIN (GLUCOPHAGE-XR) 750 MG 24 hr tablet; Take 2 tablets (1,500 mg total) by mouth daily with breakfast.  Dispense: 180 tablet; Refill: 0 - Insulin Degludec-Liraglutide (XULTOPHY) 100-3.6 UNIT-MG/ML SOPN; Inject 50 Units into the skin daily.  Dispense: 15 mL; Refill: 2 - Hemoglobin A1c  2. Type 2 diabetes, uncontrolled, with neuropathy (HCC)  - metFORMIN (GLUCOPHAGE-XR) 750 MG 24 hr tablet; Take 2 tablets (1,500 mg total) by mouth daily with breakfast.  Dispense: 180 tablet; Refill: 0 - Insulin Degludec-Liraglutide (XULTOPHY) 100-3.6 UNIT-MG/ML SOPN; Inject 50 Units into the skin daily.  Dispense: 15 mL; Refill: 2  3. Benign hypertension  - lisinopril-hydrochlorothiazide (PRINZIDE,ZESTORETIC) 10-12.5 MG tablet; Take 1 tablet by mouth daily.  Dispense: 90 tablet; Refill: 0 - COMPLETE METABOLIC PANEL WITH GFR  4. Dyslipidemia  - Lipid panel  5. GAD (generalized anxiety disorder)  Continue alprazolam for now  6. Depression, major, recurrent, mild ([place])  Coping well at this time, does not want SSRI  7. Post-inflammatory hyperpigmentation  Discussed vaseline chapstick with patient

## 2018-03-18 NOTE — Telephone Encounter (Signed)
Patient is coming in today to be seen.

## 2018-04-07 ENCOUNTER — Ambulatory Visit: Payer: BLUE CROSS/BLUE SHIELD | Admitting: Family Medicine

## 2018-04-13 ENCOUNTER — Other Ambulatory Visit: Payer: Self-pay | Admitting: Family Medicine

## 2018-04-13 ENCOUNTER — Encounter: Payer: Self-pay | Admitting: Family Medicine

## 2018-04-16 ENCOUNTER — Other Ambulatory Visit: Payer: Self-pay | Admitting: Family Medicine

## 2018-04-16 DIAGNOSIS — E1140 Type 2 diabetes mellitus with diabetic neuropathy, unspecified: Secondary | ICD-10-CM

## 2018-04-16 DIAGNOSIS — E1165 Type 2 diabetes mellitus with hyperglycemia: Principal | ICD-10-CM

## 2018-04-16 DIAGNOSIS — IMO0002 Reserved for concepts with insufficient information to code with codable children: Secondary | ICD-10-CM

## 2018-04-19 ENCOUNTER — Other Ambulatory Visit: Payer: Self-pay | Admitting: Family Medicine

## 2018-04-19 DIAGNOSIS — Z1231 Encounter for screening mammogram for malignant neoplasm of breast: Secondary | ICD-10-CM

## 2018-04-27 ENCOUNTER — Other Ambulatory Visit: Payer: Self-pay | Admitting: Family Medicine

## 2018-04-27 ENCOUNTER — Encounter: Payer: Self-pay | Admitting: Family Medicine

## 2018-04-27 DIAGNOSIS — N644 Mastodynia: Secondary | ICD-10-CM

## 2018-04-30 ENCOUNTER — Ambulatory Visit
Admission: RE | Admit: 2018-04-30 | Discharge: 2018-04-30 | Disposition: A | Discharge status: Home / Self Care | Payer: BLUE CROSS/BLUE SHIELD | Source: Ambulatory Visit | Attending: Family Medicine | Admitting: Family Medicine

## 2018-04-30 DIAGNOSIS — Z1231 Encounter for screening mammogram for malignant neoplasm of breast: Secondary | ICD-10-CM

## 2018-05-12 ENCOUNTER — Other Ambulatory Visit: Payer: Self-pay | Admitting: Family Medicine

## 2018-05-12 ENCOUNTER — Telehealth: Payer: Self-pay | Admitting: Family Medicine

## 2018-05-12 DIAGNOSIS — F411 Generalized anxiety disorder: Secondary | ICD-10-CM

## 2018-05-12 NOTE — Telephone Encounter (Signed)
Copied from [place] [id number]. Topic: Quick Communication - Rx Refill/Question >> May 12, 2018  4:12 PM Cleaster Corin, [place] wrote: Medication: ALPRAZolam (ALPRAZOLAM XR) 0.5 MG 24 hr tablet [[id number]]   Has the patient contacted their pharmacy? yes (Agent: If no, request that the patient contact the pharmacy for the refill.) (Agent: If yes, when and what did the pharmacy advise?)  Preferred Pharmacy (with phone number or street name): [facility] [address], [place] - [place] [address] Phone: [phone] Fax: [phone]    Agent: Please be advised that RX refills may take up to 3 business days. We ask that you follow-up with your pharmacy.

## 2018-05-12 NOTE — Telephone Encounter (Signed)
Patient needs appointment in order to obtain further refills. This is a controlled substance. All controlled substances need to have appointments for refills.

## 2018-05-12 NOTE — Telephone Encounter (Signed)
Pt already has an appt scheduled for 06/03/18 for her 3 mth fu. Please advise

## 2018-05-14 ENCOUNTER — Encounter: Payer: Self-pay | Admitting: Family Medicine

## 2018-05-14 ENCOUNTER — Other Ambulatory Visit: Payer: Self-pay | Admitting: Family Medicine

## 2018-05-14 DIAGNOSIS — F411 Generalized anxiety disorder: Secondary | ICD-10-CM

## 2018-05-14 MED ORDER — ALPRAZOLAM ER 0.5 MG PO TB24: 0.5000 mg | ORAL_TABLET | Freq: Every day | ORAL | 0 refills | Status: DC

## 2018-05-18 ENCOUNTER — Encounter: Payer: Self-pay | Admitting: Family Medicine

## 2018-05-31 DIAGNOSIS — I10 Essential (primary) hypertension: Secondary | ICD-10-CM | POA: Diagnosis not present

## 2018-05-31 DIAGNOSIS — E1142 Type 2 diabetes mellitus with diabetic polyneuropathy: Secondary | ICD-10-CM | POA: Diagnosis not present

## 2018-05-31 DIAGNOSIS — E785 Hyperlipidemia, unspecified: Secondary | ICD-10-CM | POA: Diagnosis not present

## 2018-06-01 LAB — COMPLETE METABOLIC PANEL WITH GFR
AG Ratio: 1.4 (calc) (ref 1.0–2.5)
ALBUMIN MSPROF: 3.9 g/dL (ref 3.6–5.1)
ALKALINE PHOSPHATASE (APISO): 96 U/L (ref 33–130)
ALT: 20 U/L (ref 6–29)
AST: 22 U/L (ref 10–35)
BILIRUBIN TOTAL: 0.3 mg/dL (ref 0.2–1.2)
BUN: 21 mg/dL (ref 7–25)
CHLORIDE: 103 mmol/L (ref 98–110)
CO2: 27 mmol/L (ref 20–32)
Calcium: 9.3 mg/dL (ref 8.6–10.4)
Creat: 0.8 mg/dL (ref 0.50–1.05)
GFR, Est African American: 97 mL/min/{1.73_m2} (ref >=60)
GFR, Est Non African American: 84 mL/min/{1.73_m2} (ref >=60)
GLOBULIN: 2.8 g/dL (ref 1.9–3.7)
GLUCOSE: 136 mg/dL — AB (ref 65–99)
POTASSIUM: 4.2 mmol/L (ref 3.5–5.3)
SODIUM: 139 mmol/L (ref 135–146)
Total Protein: 6.7 g/dL (ref 6.1–8.1)

## 2018-06-01 LAB — LIPID PANEL
CHOLESTEROL: 168 mg/dL (ref <200)
HDL: 56 mg/dL (ref >50)
LDL Cholesterol (Calc): 91 mg/dL (calc)
Non-HDL Cholesterol (Calc): 112 mg/dL (calc) (ref <130)
Total CHOL/HDL Ratio: 3 (calc) (ref <5.0)
Triglycerides: 109 mg/dL (ref <150)

## 2018-06-01 LAB — HEMOGLOBIN A1C
Hgb A1c MFr Bld: 7.6 % of total Hgb — ABNORMAL HIGH (ref <5.7)
MEAN PLASMA GLUCOSE: 171 (calc)
eAG (mmol/L): 9.5 (calc)

## 2018-06-02 ENCOUNTER — Encounter: Payer: Self-pay | Admitting: Family Medicine

## 2018-06-02 ENCOUNTER — Encounter

## 2018-06-02 ENCOUNTER — Ambulatory Visit: Payer: BLUE CROSS/BLUE SHIELD | Admitting: Family Medicine

## 2018-06-02 VITALS — BP 108/80 | HR 90 | Resp 16 | Ht 65.0 in | Wt 284.2 lb

## 2018-06-02 DIAGNOSIS — E1142 Type 2 diabetes mellitus with diabetic polyneuropathy: Secondary | ICD-10-CM | POA: Diagnosis not present

## 2018-06-02 DIAGNOSIS — Z1211 Encounter for screening for malignant neoplasm of colon: Secondary | ICD-10-CM

## 2018-06-02 DIAGNOSIS — Z01411 Encounter for gynecological examination (general) (routine) with abnormal findings: Secondary | ICD-10-CM

## 2018-06-02 DIAGNOSIS — Z6841 Body Mass Index (BMI) 40.0 and over, adult: Secondary | ICD-10-CM

## 2018-06-02 DIAGNOSIS — I10 Essential (primary) hypertension: Secondary | ICD-10-CM

## 2018-06-02 DIAGNOSIS — E785 Hyperlipidemia, unspecified: Secondary | ICD-10-CM | POA: Diagnosis not present

## 2018-06-02 DIAGNOSIS — Z01419 Encounter for gynecological examination (general) (routine) without abnormal findings: Secondary | ICD-10-CM

## 2018-06-02 DIAGNOSIS — F411 Generalized anxiety disorder: Secondary | ICD-10-CM

## 2018-06-02 DIAGNOSIS — Z124 Encounter for screening for malignant neoplasm of cervix: Secondary | ICD-10-CM

## 2018-06-02 DIAGNOSIS — F330 Major depressive disorder, recurrent, mild: Secondary | ICD-10-CM | POA: Diagnosis not present

## 2018-06-02 MED ORDER — DOXYLAMINE SUCCINATE (SLEEP) 25 MG PO TABS: 25.0000 mg | ORAL_TABLET | Freq: Every evening | ORAL | 0 refills | Status: DC | PRN

## 2018-06-02 MED ORDER — INSULIN DEGLUDEC-LIRAGLUTIDE 100-3.6 UNIT-MG/ML ~~LOC~~ SOPN: 50.0000 [IU] | PEN_INJECTOR | Freq: Every day | SUBCUTANEOUS | 2 refills | Status: DC

## 2018-06-02 MED ORDER — ALPRAZOLAM ER 0.5 MG PO TB24: 0.5000 mg | ORAL_TABLET | Freq: Every day | ORAL | 2 refills | Status: DC

## 2018-06-02 MED ORDER — PITAVASTATIN CALCIUM 2 MG PO TABS: 1.0000 | ORAL_TABLET | Freq: Every day | ORAL | 1 refills | Status: DC

## 2018-06-02 MED ORDER — METFORMIN HCL ER 750 MG PO TB24: 1500.0000 mg | ORAL_TABLET | Freq: Every day | ORAL | 1 refills | Status: DC

## 2018-06-02 MED ORDER — LISINOPRIL-HYDROCHLOROTHIAZIDE 10-12.5 MG PO TABS: 1.0000 | ORAL_TABLET | Freq: Every day | ORAL | 1 refills | Status: DC

## 2018-06-02 NOTE — Patient Instructions (Signed)
Preventive Care 40-64 Years, Female Preventive care refers to lifestyle choices and visits with your health care provider that can promote health and wellness. What does preventive care include?  A yearly physical exam. This is also called an annual well check.  Dental exams once or twice a year.  Routine eye exams. Ask your health care provider how often you should have your eyes checked.  Personal lifestyle choices, including: ? Daily care of your teeth and gums. ? Regular physical activity. ? Eating a healthy diet. ? Avoiding tobacco and drug use. ? Limiting alcohol use. ? Practicing safe sex. ? Taking low-dose aspirin daily starting at age 58. ? Taking vitamin and mineral supplements as recommended by your health care provider. What happens during an annual well check? The services and screenings done by your health care provider during your annual well check will depend on your age, overall health, lifestyle risk factors, and family history of disease. Counseling Your health care provider may ask you questions about your:  Alcohol use.  Tobacco use.  Drug use.  Emotional well-being.  Home and relationship well-being.  Sexual activity.  Eating habits.  Work and work Statistician.  Method of birth control.  Menstrual cycle.  Pregnancy history.  Screening You may have the following tests or measurements:  Height, weight, and BMI.  Blood pressure.  Lipid and cholesterol levels. These may be checked every 5 years, or more frequently if you are over 81 years old.  Skin check.  Lung cancer screening. You may have this screening every year starting at age 78 if you have a 30-pack-year history of smoking and currently smoke or have quit within the past 15 years.  Fecal occult blood test (FOBT) of the stool. You may have this test every year starting at age 65.  Flexible sigmoidoscopy or colonoscopy. You may have a sigmoidoscopy every 5 years or a colonoscopy  every 10 years starting at age 30.  Hepatitis C blood test.  Hepatitis B blood test.  Sexually transmitted disease (STD) testing.  Diabetes screening. This is done by checking your blood sugar (glucose) after you have not eaten for a while (fasting). You may have this done every 1-3 years.  Mammogram. This may be done every 1-2 years. Talk to your health care provider about when you should start having regular mammograms. This may depend on whether you have a family history of breast cancer.  BRCA-related cancer screening. This may be done if you have a family history of breast, ovarian, tubal, or peritoneal cancers.  Pelvic exam and Pap test. This may be done every 3 years starting at age 80. Starting at age 36, this may be done every 5 years if you have a Pap test in combination with an HPV test.  Bone density scan. This is done to screen for osteoporosis. You may have this scan if you are at high risk for osteoporosis.  Discuss your test results, treatment options, and if necessary, the need for more tests with your health care provider. Vaccines Your health care provider may recommend certain vaccines, such as:  Influenza vaccine. This is recommended every year.  Tetanus, diphtheria, and acellular pertussis (Tdap, Td) vaccine. You may need a Td booster every 10 years.  Varicella vaccine. You may need this if you have not been vaccinated.  Zoster vaccine. You may need this after age 5.  Measles, mumps, and rubella (MMR) vaccine. You may need at least one dose of MMR if you were born in  1957 or later. You may also need a second dose.  Pneumococcal 13-valent conjugate (PCV13) vaccine. You may need this if you have certain conditions and were not previously vaccinated.  Pneumococcal polysaccharide (PPSV23) vaccine. You may need one or two doses if you smoke cigarettes or if you have certain conditions.  Meningococcal vaccine. You may need this if you have certain  conditions.  Hepatitis A vaccine. You may need this if you have certain conditions or if you travel or work in places where you may be exposed to hepatitis A.  Hepatitis B vaccine. You may need this if you have certain conditions or if you travel or work in places where you may be exposed to hepatitis B.  Haemophilus influenzae type b (Hib) vaccine. You may need this if you have certain conditions.  Talk to your health care provider about which screenings and vaccines you need and how often you need them. This information is not intended to replace advice given to you by your health care provider. Make sure you discuss any questions you have with your health care provider. Document Released: 01/04/2016 Document Revised: 08/27/2016 Document Reviewed: 10/09/2015 Elsevier Interactive Patient Education  2018 [employer]  

## 2018-06-02 NOTE — Progress Notes (Signed)
Name: Lindsay Mcdonald   MRN: [id number]    DOB: 05/09/1961   Date:06/02/2018       Progress Note  Subjective  Chief Complaint  Chief Complaint  Patient presents with  . Diabetes  . Hypertension  . Depression  . Medication Refill  . Annual Exam    HPI   Patient presents for annual CPE and follow up  DMII: on xultophy 50 units daily not checking it daily, but when checking glucose has been under 200 but not always fasting.  Dec 2018 she resumedMetformin 1500 mg daily.  hbgA1C down from 8.8%, to 7.9%, and today is 7.6%.  She states no recent symptoms of neuropathy, but occasionally has sharp pain on her feet,  doing better at this time,on ARB but last urine micro was normal. Eye exam is up to date, foot exam today   HTN: taking lisinopril/HCTZ without any side effects, bp is at goal, denies chest pain,  palpitation or dizziness.   Hyperlipidemia; she has increased compliance with Livalo, reviewed labs and LDL has improved, is now below 100.   Vascular Malformation: she states she has a group of vessels on right outer thigh, that she noticed her 20 's, not growing or painful, She had an exam done by vascular surgeon,decided not to do anything about it at this time. Unchanged   Major Depression with anxiety: she took Zoloft in the past, states Cymbalta caused "weird feeling ", she was afraid to start Lexapro because of side effects. She is also worried about depending on SSRI, but not bothered by Alprazolam XR. Explained that BZD's are controlled and she should try to start SSRI to be able to come off BZD. Shewas doing well, however lost a friend of 20 years recently and has been grieving, she is wiling to try hospice counseling.   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    Diet: discussed healthy diet Exercise: needs to exercise more often   USPSTF grade A and B  recommendations  Depression:  Depression screen PHQ 2/9 06/02/2018 01/04/2018 06/23/2017 04/01/2017 09/24/2016  Decreased Interest 2 0 0 0 0  Down, Depressed, Hopeless 2 1 0 0 0  PHQ - 2 Score 4 1 0 0 0  Altered sleeping 1 1 - - -  Tired, decreased energy 1 1 - - -  Change in appetite 2 1 - - -  Feeling bad or failure about yourself  1 0 - - -  Trouble concentrating 0 0 - - -  Moving slowly or fidgety/restless 0 1 - - -  Suicidal thoughts 0 0 - - -  PHQ-9 Score 9 5 - - -  Difficult doing work/chores Somewhat difficult Somewhat difficult - - -   Hypertension: BP Readings from Last 3 Encounters:  06/02/18 108/80  03/18/18 120/82  01/28/18 118/68   Obesity: Wt Readings from Last 3 Encounters:  06/02/18 284 lb 3.2 oz (128.9 kg)  03/18/18 284 lb 8 oz (129 kg)  01/28/18 280 lb 11.2 oz (127.3 kg)   BMI Readings from Last 3 Encounters:  06/02/18 47.29 kg/m  03/18/18 47.34 kg/m  01/28/18 46.71 kg/m     HIV, hep B, hep C: N/A not interested  STD testing and prevention (chl/gon/syphilis): not interested  Intimate partner violence:negative  Sexual History/Pain during Intercourse: not in the past 20 years  Menstrual History/LMP/Abnormal Bleeding, discussed importance of contacting us in   case of post-menopausal bleeding  Incontinence Symptoms:   Advanced Care Planning: A voluntary discussion about advance care planning including the explanation and discussion of advance directives.  Discussed health care proxy and Living will, and the patient was able to identify a health care proxy as daughters .  Patient does not have a living will at present time. Breast cancer:  HM Mammogram  Date Value Ref Range Status  08/19/2013 Normal  Final    BRCA gene screening:  Mother had breast cancer, she had BRAC in the past and negative by gyn  Cervical cancer screening: today   Osteoporosis:  Discussed bone density but she will wait until she is 67   Lipids:  Lab Results  Component Value Date    CHOL 168 05/31/2018   CHOL 194 09/28/2017   CHOL 214 (H) 06/27/2016   Lab Results  Component Value Date   HDL 56 05/31/2018   HDL 60 09/28/2017   HDL 70 06/27/2016   Lab Results  Component Value Date   LDLCALC 91 05/31/2018   LDLCALC 106 (H) 09/28/2017   LDLCALC 115 06/27/2016   Lab Results  Component Value Date   TRIG 109 05/31/2018   TRIG 165 (H) 09/28/2017   TRIG 144 06/27/2016   Lab Results  Component Value Date   CHOLHDL 3.0 05/31/2018   CHOLHDL 3.2 09/28/2017   CHOLHDL 3.1 06/27/2016   No results found for: LDLDIRECT  Glucose:  Glucose  Date Value Ref Range Status  08/01/2014 229 (H) 65 - 99 mg/dL Final  07/11/2014 225 (H) 65 - 99 mg/dL Final  07/20/2012 182 (H) 65 - 99 mg/dL Final   Glucose, Bld  Date Value Ref Range Status  05/31/2018 136 (H) 65 - 99 mg/dL Final    Comment:    .            Fasting reference interval . For someone without known diabetes, a glucose value >125 mg/dL indicates that they may have diabetes and this should be confirmed with a follow-up test. .   09/28/2017 198 (H) 65 - 139 mg/dL Final    Comment:    .        Non-fasting reference interval .   06/27/2016 125 (H) 65 - 99 mg/dL Final    Skin cancer: discussed changing lesions Colorectal cancer: past due and spent time explained importance of having colon cancer screening, she decided to have colonoscopy  Lung cancer:  Low Dose CT Chest recommended if Age 74-80 years, 30 pack-year currently smoking OR have quit w/in 15years. Patient does not qualify.   Aspirin: taking it  ECG: in our chart   Patient Active Problem List   Diagnosis Date Noted  . Venous vascular malformations 03/27/2016  . Diabetic peripheral neuropathy associated with type 2 diabetes mellitus ([place]) 09/26/2015  . Non compliance w medication regimen 09/26/2015  . Seasonal allergic rhinitis 09/26/2015  . Muscle spasms of neck 06/19/2015  . Insomnia 06/19/2015  . Osteoarthritis, knee 06/17/2015  .  Depression, major, recurrent, mild ([place]) 06/17/2015  . Diabetes mellitus type 2, uncontrolled ([place]) 06/17/2015  . Dyslipidemia 06/17/2015  . Benign hypertension 06/17/2015  . Morbid obesity with BMI of 40.0-44.9, adult ([place]) 06/17/2015  . Vitamin D deficiency 06/17/2015    Past Surgical History:  Procedure Laterality Date  . BREAST REDUCTION SURGERY    . nasal endoscopic    . OOPHORECTOMY  ?   one ovary removed   . REDUCTION MAMMAPLASTY Bilateral 2000  . removal of  ovary      Family History  Problem Relation Age of Onset  . Diabetes Mother   . Cancer Mother 74       Breast  . Breast cancer Mother 74  . Diabetes Daughter        Oldest Daughter  . Stroke Maternal Uncle   . Breast cancer Maternal Aunt 55  . Diabetes Father     Social History   Socioeconomic History  . Marital status: Single    Spouse name: Not on file  . Number of children: 2  . Years of education: Not on file  . Highest education level: Some college, no degree  Occupational History  . Occupation: bill collector   Social Needs  . Financial resource strain: Somewhat hard  . Food insecurity:    Worry: Never true    Inability: Never true  . Transportation needs:    Medical: No    Non-medical: No  Tobacco Use  . Smoking status: Never Smoker  . Smokeless tobacco: Never Used  Substance and Sexual Activity  . Alcohol use: No    Alcohol/week: 0.0 oz  . Drug use: No  . Sexual activity: Yes    Birth control/protection: None  Lifestyle  . Physical activity:    Days per week: 0 days    Minutes per session: 0 min  . Stress: Very much  Relationships  . Social connections:    Talks on phone: More than three times a week    Gets together: Once a week    Attends religious service: More than 4 times per year    Active member of club or organization: No    Attends meetings of clubs or organizations: Never    Relationship status: Divorced  . Intimate partner violence:    Fear of current or ex partner:  No    Emotionally abused: No    Physically abused: No    Forced sexual activity: No  Other Topics Concern  . Not on file  Social History Narrative   Divorced and he died since   Working at collection agency.      Current Outpatient Medications:  .  ALPRAZolam (ALPRAZOLAM XR) 0.5 MG 24 hr tablet, Take 1 tablet (0.5 mg total) by mouth daily., Disp: 30 tablet, Rfl: 2 .  aspirin 81 MG tablet, Take 81 mg by mouth daily., Disp: , Rfl:  .  cholecalciferol (VITAMIN D) 1000 UNITS tablet, Take 1,000 Units by mouth 2 (two) times daily.  , Disp: , Rfl:  .  diclofenac sodium (VOLTAREN) 1 % GEL, Apply 2 g topically 4 (four) times daily., Disp: 100 g, Rfl: 2 .  fluticasone (FLONASE) 50 MCG/ACT nasal spray, USE 2 SPRAY(S) IN EACH NOSTRIL ONCE DAILY, Disp: 17 g, Rfl: 0 .  glucose blood test strip, Use as instructed, Disp: 100 each, Rfl: 12 .  Insulin Degludec-Liraglutide (XULTOPHY) 100-3.6 UNIT-MG/ML SOPN, Inject 50 Units into the skin daily., Disp: 15 mL, Rfl: 2 .  Insulin Pen Needle 32G X 6 MM MISC, 1 each by Does not apply route daily., Disp: 100 each, Rfl: 1 .  lisinopril-hydrochlorothiazide (PRINZIDE,ZESTORETIC) 10-12.5 MG tablet, Take 1 tablet by mouth daily., Disp: 90 tablet, Rfl: 1 .  magnesium 30 MG tablet, Take 30 mg by mouth., Disp: , Rfl:  .  metFORMIN (GLUCOPHAGE-XR) 750 MG 24 hr tablet, Take 2 tablets (1,500 mg total) by mouth daily with breakfast., Disp: 180 tablet, Rfl: 1 .  montelukast (SINGULAIR) 10 MG tablet, Take 1 tablet   by mouth daily., Disp: , Rfl:  .  Multiple Vitamin (MULITIVITAMIN WITH MINERALS) TABS, Take 1 tablet by mouth daily.  , Disp: , Rfl:  .  ONETOUCH DELICA LANCETS FINE MISC, 1 Units by Does not apply route 2 (two) times daily., Disp: 100 each, Rfl: 2 .  Pitavastatin Calcium (LIVALO) 2 MG TABS, Take 1 tablet (2 mg total) by mouth daily., Disp: 90 tablet, Rfl: 1 .  doxylamine, Sleep, (UNISOM) 25 MG tablet, Take 1 tablet (25 mg total) by mouth at bedtime as needed., Disp:  30 tablet, Rfl: 0  Allergies  Allergen Reactions  . Atorvastatin     and pravastatin caused muscle cramps, also simvastatin  . Baclofen     spasms  . Compazine Other (See Comments)    Twitching and spasms of neck muscles  . Penicillins     rash     ROS   Constitutional: Negative for fever or weight change.  Respiratory: Negative for cough and shortness of breath.   Cardiovascular: Negative for chest pain or palpitations.  Gastrointestinal: Negative for abdominal pain, no bowel changes.  Musculoskeletal: Negative for gait problem or joint swelling.  Skin: Negative for rash.  Neurological: Negative for dizziness or headache.  No other specific complaints in a complete review of systems (except as listed in HPI above).   Objective  Vitals:   06/02/18 1113  BP: 108/80  Pulse: 90  Resp: 16  SpO2: 97%  Weight: 284 lb 3.2 oz (128.9 kg)  Height: 5' 5" (1.651 m)    Body mass index is 47.29 kg/m.  Physical Exam  Constitutional: Patient appears well-developed and obesity . No distress.  HENT: Head: Normocephalic and atraumatic. Ears: B TMs ok, no erythema or effusion; Nose: Nose normal. Mouth/Throat: Oropharynx is clear and moist. No oropharyngeal exudate.  Eyes: Conjunctivae and EOM are normal. Pupils are equal, round, and reactive to light. No scleral icterus.  Neck: Normal range of motion. Neck supple. No JVD present. No thyromegaly present.  Cardiovascular: Normal rate, regular rhythm and normal heart sounds.  No murmur heard. No BLE edema. Pulmonary/Chest: Effort normal and breath sounds normal. No respiratory distress. Abdominal: Soft. Bowel sounds are normal, no distension. There is no tenderness. no masses Breast: no lumps or masses, no nipple discharge or rashes FEMALE GENITALIA:  External genitalia normal External urethra normal Vaginal vault normal without discharge or lesions Cervix normal without discharge or lesions Bimanual exam normal without  masses RECTAL: not done Musculoskeletal: Normal range of motion, no joint effusions. No gross deformities Neurological: he is alert and oriented to person, place, and time. No cranial nerve deficit. Coordination, balance, strength, speech and gait are normal.  Skin: Skin is warm and dry. No rash noted. No erythema.  Psychiatric: Patient has a normal mood and affect. behavior is normal. Judgment and thought content normal.   Recent Results (from the past 2160 hour(s))  Hemoglobin A1c     Status: Abnormal   Collection Time: 05/31/18  8:18 AM  Result Value Ref Range   Hgb A1c MFr Bld 7.6 (H) <5.7 % of total Hgb    Comment: For someone without known diabetes, a hemoglobin A1c value of 6.5% or greater indicates that they may have  diabetes and this should be confirmed with a follow-up  test. . For someone with known diabetes, a value <7% indicates  that their diabetes is well controlled and a value  greater than or equal to 7% indicates suboptimal  control. A1c targets should be   individualized based on  duration of diabetes, age, comorbid conditions, and  other considerations. . Currently, no consensus exists regarding use of hemoglobin A1c for diagnosis of diabetes for children. .    Mean Plasma Glucose 171 (calc)   eAG (mmol/L) 9.5 (calc)  COMPLETE METABOLIC PANEL WITH GFR     Status: Abnormal   Collection Time: 05/31/18  8:18 AM  Result Value Ref Range   Glucose, Bld 136 (H) 65 - 99 mg/dL    Comment: .            Fasting reference interval . For someone without known diabetes, a glucose value >125 mg/dL indicates that they may have diabetes and this should be confirmed with a follow-up test. .    BUN 21 7 - 25 mg/dL   Creat 0.80 0.50 - 1.05 mg/dL    Comment: For patients >49 years of age, the reference limit for Creatinine is approximately 13% higher for people identified as African-American. .    GFR, Est Non African American 84 > OR = 60 mL/min/1.73m2   GFR, Est  African American 97 > OR = 60 mL/min/1.73m2   BUN/Creatinine Ratio NOT APPLICABLE 6 - 22 (calc)   Sodium 139 135 - 146 mmol/L   Potassium 4.2 3.5 - 5.3 mmol/L   Chloride 103 98 - 110 mmol/L   CO2 27 20 - 32 mmol/L   Calcium 9.3 8.6 - 10.4 mg/dL   Total Protein 6.7 6.1 - 8.1 g/dL   Albumin 3.9 3.6 - 5.1 g/dL   Globulin 2.8 1.9 - 3.7 g/dL (calc)   AG Ratio 1.4 1.0 - 2.5 (calc)   Total Bilirubin 0.3 0.2 - 1.2 mg/dL   Alkaline phosphatase (APISO) 96 33 - 130 U/L   AST 22 10 - 35 U/L   ALT 20 6 - 29 U/L  Lipid panel     Status: None   Collection Time: 05/31/18  8:18 AM  Result Value Ref Range   Cholesterol 168 <200 mg/dL   HDL 56 >50 mg/dL   Triglycerides 109 <150 mg/dL   LDL Cholesterol (Calc) 91 mg/dL (calc)    Comment: Reference range: <100 . Desirable range <100 mg/dL for primary prevention;   <70 mg/dL for patients with CHD or diabetic patients  with > or = 2 CHD risk factors. . LDL-C is now calculated using the Martin-Hopkins  calculation, which is a validated novel method providing  better accuracy than the Friedewald equation in the  estimation of LDL-C.  Martin SS et al. JAMA. 2013;310(19): 2061-2068  (http://education.[facility].com/faq/FAQ164)    Total CHOL/HDL Ratio 3.0 <5.0 (calc)   Non-HDL Cholesterol (Calc) 112 <130 mg/dL (calc)    Comment: For patients with diabetes plus 1 major ASCVD risk  factor, treating to a non-HDL-C goal of <100 mg/dL  (LDL-C of <70 mg/dL) is considered a therapeutic  option.     Diabetic Foot Exam: Diabetic Foot Exam - Simple   Simple Foot Form Visual Inspection See comments:  Yes Sensation Testing Intact to touch and monofilament testing bilaterally:  Yes Pulse Check Posterior Tibialis and Dorsalis pulse intact bilaterally:  Yes Comments Corn formation       PHQ2/9: Depression screen PHQ 2/9 06/02/2018 01/04/2018 06/23/2017 04/01/2017 09/24/2016  Decreased Interest 2 0 0 0 0  Down, Depressed, Hopeless 2 1 0 0 0  PHQ -  2 Score 4 1 0 0 0  Altered sleeping 1 1 - - -  Tired, decreased energy 1 1 - - -    Change in appetite 2 1 - - -  Feeling bad or failure about yourself  1 0 - - -  Trouble concentrating 0 0 - - -  Moving slowly or fidgety/restless 0 1 - - -  Suicidal thoughts 0 0 - - -  PHQ-9 Score 9 5 - - -  Difficult doing work/chores Somewhat difficult Somewhat difficult - - -     Fall Risk: Fall Risk  06/02/2018 03/18/2018 01/04/2018 12/02/2017 06/23/2017  Falls in the past year? No No No No No     Functional Status Survey: Is the patient deaf or have difficulty hearing?: No Does the patient have difficulty seeing, even when wearing glasses/contacts?: No Does the patient have difficulty concentrating, remembering, or making decisions?: No Does the patient have difficulty walking or climbing stairs?: No Does the patient have difficulty dressing or bathing?: No Does the patient have difficulty doing errands alone such as visiting a doctor's office or shopping?: No   Assessment & Plan  1. Well woman exam  Discussed importance of 150 minutes of physical activity weekly, eat two servings of fish weekly, eat one serving of tree nuts ( cashews, pistachios, pecans, almonds..) every other day, eat 6 servings of fruit/vegetables daily and drink plenty of water and avoid sweet beverages.   1. Benign hypertension  - lisinopril-hydrochlorothiazide (PRINZIDE,ZESTORETIC) 10-12.5 MG tablet; Take 1 tablet by mouth daily.  Dispense: 90 tablet; Refill: 1  2. Diabetic peripheral neuropathy associated with type 2 diabetes mellitus (HCC)  - Insulin Degludec-Liraglutide (XULTOPHY) 100-3.6 UNIT-MG/ML SOPN; Inject 50 Units into the skin daily.  Dispense: 15 mL; Refill: 2 - metFORMIN (GLUCOPHAGE-XR) 750 MG 24 hr tablet; Take 2 tablets (1,500 mg total) by mouth daily with breakfast.  Dispense: 180 tablet; Refill: 1  3. Depression, major, recurrent, mild (HCC)  A little worse since she lost friend recently, does not  like taking SSRI, discussed hospice counseling   4. Morbid obesity with BMI of 40.0-44.9, adult ([facility])  Discussed with the patient the risk posed by an increased BMI. Discussed importance of portion control, calorie counting and at least 150 minutes of physical activity weekly. Avoid sweet beverages and drink more water. Eat at least 6 servings of fruit and vegetables daily   5. GAD (generalized anxiety disorder)  - ALPRAZolam (ALPRAZOLAM XR) 0.5 MG 24 hr tablet; Take 1 tablet (0.5 mg total) by mouth daily.  Dispense: 30 tablet; Refill: 2  6. Cervical cancer screening  - Pap IG and HPV (high risk) DNA detection  7. Colon cancer screening  - Ambulatory referral to Gastroenterology  8. Type 2 diabetes, uncontrolled, with neuropathy (HCC)  - Insulin Degludec-Liraglutide (XULTOPHY) 100-3.6 UNIT-MG/ML SOPN; Inject 50 Units into the skin daily.  Dispense: 15 mL; Refill: 2 - metFORMIN (GLUCOPHAGE-XR) 750 MG 24 hr tablet; Take 2 tablets (1,500 mg total) by mouth daily with breakfast.  Dispense: 180 tablet; Refill: 1  9. Dyslipidemia  - Pitavastatin Calcium (LIVALO) 2 MG TABS; Take 1 tablet (2 mg total) by mouth daily.  Dispense: 90 tablet; Refill: 1    

## 2018-06-03 ENCOUNTER — Other Ambulatory Visit: Payer: Self-pay

## 2018-06-03 ENCOUNTER — Ambulatory Visit: Payer: BLUE CROSS/BLUE SHIELD | Admitting: Family Medicine

## 2018-06-03 DIAGNOSIS — Z1211 Encounter for screening for malignant neoplasm of colon: Secondary | ICD-10-CM

## 2018-06-03 LAB — PAP IG AND HPV HIGH-RISK: HPV DNA High Risk: NOT DETECTED

## 2018-06-05 ENCOUNTER — Encounter: Payer: Self-pay | Admitting: Family Medicine

## 2018-06-07 ENCOUNTER — Other Ambulatory Visit: Payer: Self-pay | Admitting: Family Medicine

## 2018-06-07 DIAGNOSIS — F330 Major depressive disorder, recurrent, mild: Secondary | ICD-10-CM

## 2018-06-07 MED ORDER — FLUOXETINE HCL 90 MG PO CPDR: 90.0000 mg | DELAYED_RELEASE_CAPSULE | ORAL | 0 refills | Status: DC

## 2018-06-07 NOTE — Progress Notes (Unsigned)
pro

## 2018-06-09 ENCOUNTER — Encounter: Payer: Self-pay | Admitting: Family Medicine

## 2018-06-30 ENCOUNTER — Ambulatory Visit: Payer: BLUE CROSS/BLUE SHIELD | Admitting: Family Medicine

## 2018-07-21 ENCOUNTER — Telehealth: Payer: Self-pay | Admitting: Gastroenterology

## 2018-07-21 NOTE — Telephone Encounter (Signed)
FYI:  Patient canceled her colonoscopy. She will call us back when she is ready to reschedule. I called [facility] to let them know.

## 2018-07-27 ENCOUNTER — Encounter: Payer: Self-pay | Admitting: Family Medicine

## 2018-08-11 ENCOUNTER — Encounter: Payer: Self-pay | Admitting: Family Medicine

## 2018-08-11 ENCOUNTER — Ambulatory Visit: Payer: BLUE CROSS/BLUE SHIELD | Admitting: Family Medicine

## 2018-08-11 VITALS — BP 112/70 | HR 89 | Temp 98.1°F | Resp 16 | Ht 65.0 in | Wt 275.7 lb

## 2018-08-11 DIAGNOSIS — F411 Generalized anxiety disorder: Secondary | ICD-10-CM | POA: Diagnosis not present

## 2018-08-11 DIAGNOSIS — F330 Major depressive disorder, recurrent, mild: Secondary | ICD-10-CM

## 2018-08-11 MED ORDER — FLUOXETINE HCL 90 MG PO CPDR: 90.0000 mg | DELAYED_RELEASE_CAPSULE | ORAL | 0 refills | Status: DC

## 2018-08-11 NOTE — Progress Notes (Signed)
Name: Lindsay Mcdonald   MRN: [id number]    DOB: 1961/06/10   Date:08/11/2018       Progress Note  Subjective  Chief Complaint  Chief Complaint  Patient presents with  . Follow-up  . Anxiety  . Medication Management    unisom caused some SOB    HPI  GAD/MDD: she is not sure what happened to the Prozac prescription. She does not recall picking it up, but we called pharmacy and she filled rx ( one time only ) .She is still taking alprazolam XR 0.5 mg. She states she continues to worry all the time, gets frustrated with co-workers that are talking around her at work. She has difficulty falling asleep and relaxing if she does not take Unysom or Ibuprofen pm. Lack of motivation and energy but trying to eat better and exercise more . She states the news makes it worse. She states she always carries the stress on her shoulders. She will try to go back on Prozac    Patient Active Problem List   Diagnosis Date Noted  . Venous vascular malformations 03/27/2016  . Diabetic peripheral neuropathy associated with type 2 diabetes mellitus ([place]) 09/26/2015  . Non compliance w medication regimen 09/26/2015  . Seasonal allergic rhinitis 09/26/2015  . Muscle spasms of neck 06/19/2015  . Insomnia 06/19/2015  . Osteoarthritis, knee 06/17/2015  . Depression, major, recurrent, mild ([place]) 06/17/2015  . Diabetes mellitus type 2, uncontrolled ([place]) 06/17/2015  . Dyslipidemia 06/17/2015  . Benign hypertension 06/17/2015  . Morbid obesity with BMI of 40.0-44.9, adult ([place]) 06/17/2015  . Vitamin D deficiency 06/17/2015    Past Surgical History:  Procedure Laterality Date  . BREAST REDUCTION SURGERY    . nasal endoscopic    . OOPHORECTOMY  ?   one ovary removed   . REDUCTION MAMMAPLASTY Bilateral 2000  . removal of ovary      Family History  Problem Relation Age of Onset  . Diabetes Mother   . Cancer Mother 80       Breast  . Breast cancer Mother 66  . Gout Mother   . Diabetes Daughter         Oldest Daughter  . Stroke Maternal Uncle   . Breast cancer Maternal Aunt 43  . Diabetes Father   . Kidney cancer Maternal Aunt   . Kidney disease Maternal Aunt     Social History   Socioeconomic History  . Marital status: Single    Spouse name: Not on file  . Number of children: 2  . Years of education: Not on file  . Highest education level: Some college, no degree  Occupational History  . Occupation: Designer, television/film set   Social Needs  . Financial resource strain: Somewhat hard  . Food insecurity:    Worry: Never true    Inability: Never true  . Transportation needs:    Medical: No    Non-medical: No  Tobacco Use  . Smoking status: Never Smoker  . Smokeless tobacco: Never Used  Substance and Sexual Activity  . Alcohol use: No    Alcohol/week: 0.0 standard drinks  . Drug use: No  . Sexual activity: Yes    Partners: Male    Birth control/protection: None  Lifestyle  . Physical activity:    Days per week: 0 days    Minutes per session: 0 min  . Stress: Very much  Relationships  . Social connections:    Talks on phone: More than three times a week  Gets together: Once a week    Attends religious service: More than 4 times per year    Active member of club or organization: No    Attends meetings of clubs or organizations: Never    Relationship status: Divorced  . Intimate partner violence:    Fear of current or ex partner: No    Emotionally abused: No    Physically abused: No    Forced sexual activity: No  Other Topics Concern  . Not on file  Social History Narrative   Divorced and he died since   Working at [employer]/design[employer].      Current Outpatient Medications:  .  ALPRAZolam (ALPRAZOLAM XR) 0.5 MG 24 hr tablet, Take 1 tablet (0.5 mg total) by mouth daily., Disp: 30 tablet, Rfl: 2 .  aspirin 81 MG tablet, Take 81 mg by mouth daily., Disp: , Rfl:  .  glucose blood test strip, Use as instructed, Disp: 100 each, Rfl: 12 .  ibuprofen (ADVIL,MOTRIN) 200 MG  tablet, Take 200 mg by mouth every 6 (six) hours as needed., Disp: , Rfl:  .  Insulin Degludec-Liraglutide (XULTOPHY) 100-3.6 UNIT-MG/ML SOPN, Inject 50 Units into the skin daily., Disp: 15 mL, Rfl: 2 .  Insulin Pen Needle 32G X 6 MM MISC, 1 each by Does not apply route daily., Disp: 100 each, Rfl: 1 .  lisinopril-hydrochlorothiazide (PRINZIDE,ZESTORETIC) 10-12.5 MG tablet, Take 1 tablet by mouth daily., Disp: 90 tablet, Rfl: 1 .  metFORMIN (GLUCOPHAGE-XR) 750 MG 24 hr tablet, Take 2 tablets (1,500 mg total) by mouth daily with breakfast., Disp: 180 tablet, Rfl: 1 .  Multiple Vitamin (MULITIVITAMIN WITH MINERALS) TABS, Take 1 tablet by mouth daily.  , Disp: , Rfl:  .  ONETOUCH DELICA LANCETS FINE MISC, 1 Units by Does not apply route 2 (two) times daily., Disp: 100 each, Rfl: 2 .  Pitavastatin Calcium (LIVALO) 2 MG TABS, Take 1 tablet (2 mg total) by mouth daily., Disp: 90 tablet, Rfl: 1 .  cholecalciferol (VITAMIN D) 1000 UNITS tablet, Take 1,000 Units by mouth 2 (two) times daily.  , Disp: , Rfl:  .  diclofenac sodium (VOLTAREN) 1 % GEL, Apply 2 g topically 4 (four) times daily. (Patient not taking: Reported on 08/11/2018), Disp: 100 g, Rfl: 2 .  doxylamine, Sleep, (UNISOM) 25 MG tablet, Take 1 tablet (25 mg total) by mouth at bedtime as needed. (Patient not taking: Reported on 08/11/2018), Disp: 30 tablet, Rfl: 0 .  FLUoxetine (PROZAC WEEKLY) 90 MG DR capsule, Take 1 capsule (90 mg total) by mouth every 7 (seven) days., Disp: 4 capsule, Rfl: 0 .  fluticasone (FLONASE) 50 MCG/ACT nasal spray, USE 2 SPRAY(S) IN EACH NOSTRIL ONCE DAILY (Patient not taking: Reported on 08/11/2018), Disp: 17 g, Rfl: 0 .  magnesium 30 MG tablet, Take 30 mg by mouth., Disp: , Rfl:  .  montelukast (SINGULAIR) 10 MG tablet, Take 1 tablet by mouth daily., Disp: , Rfl:   Allergies  Allergen Reactions  . Atorvastatin     and pravastatin caused muscle cramps, also simvastatin  . Baclofen     spasms  . Compazine Other (See  Comments)    Twitching and spasms of neck muscles  . Penicillins     rash     ROS  Ten systems reviewed and is negative except as mentioned in HPI   Objective  Vitals:   08/11/18 1543  BP: 112/70  Pulse: 89  Resp: 16  Temp: 98.1 F (36.7 C)  TempSrc:  Oral  SpO2: 99%  Weight: 275 lb 11.2 oz (125.1 kg)  Height: 5\' 5"  (1.651 m)    Body mass index is 45.88 kg/m.  Physical Exam  Constitutional: Patient appears well-developed and well-nourished. Obese No distress.  HEENT: head atraumatic, normocephalic, pupils equal and reactive to light,neck supple, throat within normal limits Cardiovascular: Normal rate, regular rhythm and normal heart sounds.  No murmur heard. No BLE edema. Pulmonary/Chest: Effort normal and breath sounds normal. No respiratory distress. Abdominal: Soft.  There is no tenderness. Psychiatric: Patient has a normal mood and affect. behavior is normal. Judgment and thought content normal.  Recent Results (from the past 2160 hour(s))  Hemoglobin A1c     Status: Abnormal   Collection Time: 05/31/18  8:18 AM  Result Value Ref Range   Hgb A1c MFr Bld 7.6 (H) <5.7 % of total Hgb    Comment: For someone without known diabetes, a hemoglobin A1c value of 6.5% or greater indicates that they may have  diabetes and this should be confirmed with a follow-up  test. . For someone with known diabetes, a value <7% indicates  that their diabetes is well controlled and a value  greater than or equal to 7% indicates suboptimal  control. A1c targets should be individualized based on  duration of diabetes, age, comorbid conditions, and  other considerations. . Currently, no consensus exists regarding use of hemoglobin A1c for diagnosis of diabetes for children. .    Mean Plasma Glucose 171 (calc)   eAG (mmol/L) 9.5 (calc)  COMPLETE METABOLIC PANEL WITH GFR     Status: Abnormal   Collection Time: 05/31/18  8:18 AM  Result Value Ref Range   Glucose, Bld 136 (H) 65 -  99 mg/dL    Comment: .            Fasting reference interval . For someone without known diabetes, a glucose value >125 mg/dL indicates that they may have diabetes and this should be confirmed with a follow-up test. .    BUN 21 7 - 25 mg/dL   Creat 0.80 0.50 - 1.05 mg/dL    Comment: For patients >92 years of age, the reference limit for Creatinine is approximately 13% higher for people identified as African-American. .    GFR, Est Non African American 84 > OR = 60 mL/min/1.16m2   GFR, Est African American 97 > OR = 60 mL/min/1.39m2   BUN/Creatinine Ratio NOT APPLICABLE 6 - 22 (calc)   Sodium 139 135 - 146 mmol/L   Potassium 4.2 3.5 - 5.3 mmol/L   Chloride 103 98 - 110 mmol/L   CO2 27 20 - 32 mmol/L   Calcium 9.3 8.6 - 10.4 mg/dL   Total Protein 6.7 6.1 - 8.1 g/dL   Albumin 3.9 3.6 - 5.1 g/dL   Globulin 2.8 1.9 - 3.7 g/dL (calc)   AG Ratio 1.4 1.0 - 2.5 (calc)   Total Bilirubin 0.3 0.2 - 1.2 mg/dL   Alkaline phosphatase (APISO) 96 33 - 130 U/L   AST 22 10 - 35 U/L   ALT 20 6 - 29 U/L  Lipid panel     Status: None   Collection Time: 05/31/18  8:18 AM  Result Value Ref Range   Cholesterol 168 <200 mg/dL   HDL 56 >50 mg/dL   Triglycerides 109 <150 mg/dL   LDL Cholesterol (Calc) 91 mg/dL (calc)    Comment: Reference range: <100 . Desirable range <100 mg/dL for primary prevention;   <70 mg/dL for  patients with CHD or diabetic patients  with > or = 2 CHD risk factors. Marland Kitchen LDL-C is now calculated using the Martin-Hopkins  calculation, which is a validated novel method providing  better accuracy than the Friedewald equation in the  estimation of LDL-C.  Cresenciano Genre et al. Annamaria Helling. 6160;737(10): 2061-2068  (http://education.[facility].com/faq/FAQ164)    Total CHOL/HDL Ratio 3.0 <5.0 (calc)   Non-HDL Cholesterol (Calc) 112 <130 mg/dL (calc)    Comment: For patients with diabetes plus 1 major ASCVD risk  factor, treating to a non-HDL-C goal of <100 mg/dL  (LDL-C of <70  mg/dL) is considered a therapeutic  option.   Pap IG and HPV (high risk) DNA detection     Status: None   Collection Time: 06/02/18 12:38 PM  Result Value Ref Range   Clinical Information:      Comment: None given   LMP:      Comment: NONE GIVEN   PREV. PAP:      Comment: NEGATIVE   PREV. BX:      Comment: NONE GIVEN   HPV DNA Probe-Source      Comment: Endocervix   STATEMENT OF ADEQUACY:      Comment: Satisfactory for evaluation. Endocervical/transformation zone component present. Partially obscuring inflammation    INTERPRETATION/RESULT:      Comment: Negative for intraepithelial lesion or malignancy.   Comment:      Comment: This Pap test has been evaluated with computer assisted technology.    CYTOTECHNOLOGIST:      Comment: JRW, CT(ASCP) CT screening location: [address], [place], [place] [address]    HPV DNA High Risk Not Detected Not Detect    Comment: This test was performed using the APTIMA HPV Assay ([employer] . This assay detects E6/E7 viral messenger RNA (mRNA) from 14 high-risk HPV types (16,18,31,33,35,39,45,51,52,56,58,59,66,68). . The analytical performance characteristics of this assay have been determined by [facility]. The modifications have not been cleared or approved by the FDA. This assay has been validated pursuant to the CLIA regulations and is used for clinical purposes. EXPLANATORY NOTE:  . The Pap is a screening test for cervical cancer. It is  not a diagnostic test and is subject to false negative  and false positive results. It is most reliable when a  satisfactory sample, regularly obtained, is submitted  with relevant clinical findings and history, and when  the Pap result is evaluated along with historic and  current clinical information. .       PHQ2/9: Depression screen [facility] 2/9 08/11/2018 06/02/2018 01/04/2018 06/23/2017 04/01/2017  Decreased Interest 1 2 0 0 0  Down, Depressed, Hopeless 0 2 1 0 0   PHQ - 2 Score 1 4 1  0 0  Altered sleeping 1 1 1  - -  Tired, decreased energy 1 1 1  - -  Change in appetite 1 2 1  - -  Feeling bad or failure about yourself  0 1 0 - -  Trouble concentrating 0 0 0 - -  Moving slowly or fidgety/restless 0 0 1 - -  Suicidal thoughts 0 0 0 - -  PHQ-9 Score 4 9 5  - -  Difficult doing work/chores Not difficult at all Somewhat difficult Somewhat difficult - -    GAD 7 : Generalized Anxiety Score 08/11/2018 01/04/2018 01/02/2016  Nervous, Anxious, on Edge 1 0 1  Control/stop worrying 1 1 2   Worry too much - different things 1 1 1   Trouble relaxing 1 1 0  Restless 0 0  0  Easily annoyed or irritable 1 0 1  Afraid - awful might happen 2 0 1  Total GAD 7 Score 7 3 6   Anxiety Difficulty Somewhat difficult Somewhat difficult Somewhat difficult     Fall Risk: Fall Risk  06/02/2018 03/18/2018 01/04/2018 12/02/2017 06/23/2017  Falls in the past year? No No No No No     Functional Status Survey: Is the patient deaf or have difficulty hearing?: No Does the patient have difficulty seeing, even when wearing glasses/contacts?: No Does the patient have difficulty concentrating, remembering, or making decisions?: No Does the patient have difficulty walking or climbing stairs?: No Does the patient have difficulty dressing or bathing?: No Does the patient have difficulty doing errands alone such as visiting a doctor's office or shopping?: No    Assessment & Plan  1. Depression, major, recurrent, mild (HCC)  - FLUoxetine (PROZAC WEEKLY) 90 MG DR capsule; Take 1 capsule (90 mg total) by mouth every 7 (seven) days.  Dispense: 4 capsule; Refill: 0  2. GAD (generalized anxiety disorder)  - FLUoxetine (PROZAC WEEKLY) 90 MG DR capsule; Take 1 capsule (90 mg total) by mouth every 7 (seven) days.  Dispense: 4 capsule; Refill: 0

## 2018-09-01 ENCOUNTER — Ambulatory Visit: Payer: BLUE CROSS/BLUE SHIELD | Admitting: Family Medicine

## 2018-09-02 ENCOUNTER — Ambulatory Visit: Admit: 2018-09-02 | Payer: BLUE CROSS/BLUE SHIELD | Admitting: Gastroenterology

## 2018-09-02 SURGERY — COLONOSCOPY WITH PROPOFOL
Anesthesia: General

## 2018-09-10 ENCOUNTER — Ambulatory Visit: Payer: BLUE CROSS/BLUE SHIELD | Admitting: Family Medicine

## 2018-09-12 DIAGNOSIS — L03011 Cellulitis of right finger: Secondary | ICD-10-CM | POA: Diagnosis not present

## 2018-09-14 ENCOUNTER — Other Ambulatory Visit: Payer: Self-pay | Admitting: Family Medicine

## 2018-09-14 DIAGNOSIS — F330 Major depressive disorder, recurrent, mild: Secondary | ICD-10-CM

## 2018-09-14 DIAGNOSIS — F411 Generalized anxiety disorder: Secondary | ICD-10-CM

## 2018-09-14 MED ORDER — FLUOXETINE HCL 90 MG PO CPDR: 90.0000 mg | DELAYED_RELEASE_CAPSULE | ORAL | 0 refills | Status: DC

## 2018-09-14 NOTE — Telephone Encounter (Signed)
Refill request for general medication. Weekly Prozac   Last office visit 08/11/2018    Follow up on 09/28/2018

## 2018-09-19 ENCOUNTER — Other Ambulatory Visit: Payer: Self-pay | Admitting: Family Medicine

## 2018-09-19 DIAGNOSIS — F411 Generalized anxiety disorder: Secondary | ICD-10-CM

## 2018-09-20 ENCOUNTER — Encounter: Payer: Self-pay | Admitting: Family Medicine

## 2018-09-22 ENCOUNTER — Ambulatory Visit: Payer: BLUE CROSS/BLUE SHIELD | Admitting: Family Medicine

## 2018-09-23 ENCOUNTER — Ambulatory Visit: Payer: BLUE CROSS/BLUE SHIELD | Admitting: Family Medicine

## 2018-09-28 ENCOUNTER — Ambulatory Visit: Payer: BLUE CROSS/BLUE SHIELD | Admitting: Family Medicine

## 2018-09-28 ENCOUNTER — Encounter: Payer: Self-pay | Admitting: Family Medicine

## 2018-09-28 VITALS — BP 122/78 | HR 87 | Temp 97.9°F | Resp 16 | Ht 65.0 in | Wt 278.1 lb

## 2018-09-28 DIAGNOSIS — Z6841 Body Mass Index (BMI) 40.0 and over, adult: Secondary | ICD-10-CM

## 2018-09-28 DIAGNOSIS — Z23 Encounter for immunization: Secondary | ICD-10-CM

## 2018-09-28 DIAGNOSIS — I10 Essential (primary) hypertension: Secondary | ICD-10-CM

## 2018-09-28 DIAGNOSIS — E1142 Type 2 diabetes mellitus with diabetic polyneuropathy: Secondary | ICD-10-CM

## 2018-09-28 DIAGNOSIS — F330 Major depressive disorder, recurrent, mild: Secondary | ICD-10-CM | POA: Diagnosis not present

## 2018-09-28 DIAGNOSIS — E785 Hyperlipidemia, unspecified: Secondary | ICD-10-CM

## 2018-09-28 DIAGNOSIS — F5101 Primary insomnia: Secondary | ICD-10-CM

## 2018-09-28 DIAGNOSIS — F411 Generalized anxiety disorder: Secondary | ICD-10-CM | POA: Diagnosis not present

## 2018-09-28 LAB — POCT GLYCOSYLATED HEMOGLOBIN (HGB A1C): HBA1C, POC (CONTROLLED DIABETIC RANGE): 6.8 % (ref 0.0–7.0)

## 2018-09-28 MED ORDER — DIPHENHYDRAMINE-APAP 25-500 & 500 MG PO MISC: 1.0000 | Freq: Every evening | ORAL | 0 refills | Status: DC

## 2018-09-28 MED ORDER — LISINOPRIL-HYDROCHLOROTHIAZIDE 10-12.5 MG PO TABS: 1.0000 | ORAL_TABLET | Freq: Every day | ORAL | 1 refills | Status: DC

## 2018-09-28 MED ORDER — ALPRAZOLAM ER 0.5 MG PO TB24: 0.5000 mg | ORAL_TABLET | Freq: Every day | ORAL | 2 refills | Status: DC

## 2018-09-28 MED ORDER — PITAVASTATIN CALCIUM 2 MG PO TABS: 1.0000 | ORAL_TABLET | Freq: Every day | ORAL | 1 refills | Status: DC

## 2018-09-28 MED ORDER — METFORMIN HCL ER 750 MG PO TB24: 1500.0000 mg | ORAL_TABLET | Freq: Every day | ORAL | 1 refills | Status: DC

## 2018-09-28 NOTE — Progress Notes (Signed)
Name: Lindsay Mcdonald   MRN: [id number]    DOB: December 28, 1960   Date:09/28/2018       Progress Note  Subjective  Chief Complaint  Chief Complaint  Patient presents with  . Depression  . Anxiety  . Medication Refill    alprazolam   . Medication Management    patient would like to talk about her insulin. patient has a terrible injection site bruise  . Immunizations    flu shot     HPI    DMII: on Xultophy 50 units daily,also Metformin most days of the week, not checking glucose at home because she is out of strips.  HbgA1C down from 8.8%, to 7.9%, last visit was 7.6%.  She states no recent symptoms of neuropathy, but occasionally has sharp pain on her feet,  doing better at this time,on ARB but last urine micro was normal. Eye exam is up to date, foot exam today   HTN: taking lisinopril/HCTZ without any side effects, denies chest pain,palpitation or dizziness. BP is at goal   Hyperlipidemia; she was taking Livalo daily but ran out a few weeks ago and has not gotten a refill yet. Last LDL back in June when taking medication as at goal. LDL was 91 and HDL was 56  Vascular Malformation: she states she has a group of vessels on right outer thigh, that she noticed her 4 's, not growing or painful, She had an exam done by vascular surgeon,decided not to do anything about it at this time. Unchanged   Major Depression with anxiety: she took Zoloft in the past, states Cymbalta caused "weird feeling ", she was afraid to start Lexapro because of side effects. She is also worried about depending on SSRI, but not bothered by Alprazolam XR. Explained that BZD's are controlled and she should try to start SSRI to be able to come off BZD. 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 She has gained two pounds since last visit.   AR: she has a history of allergies and was at [facility] last night and choked on her food, feels like it went inside her nose and has noticed a mild cough and also feels like something is inside her nose, discussed saline spray and if no improvement go to ENT - Dr. Aquilla Solian.    Patient Active Problem List   Diagnosis Date Noted  . Venous vascular malformations 03/27/2016  . Diabetic peripheral neuropathy associated with type 2 diabetes mellitus ([place]) 09/26/2015  . Non compliance w medication regimen 09/26/2015  . Seasonal allergic rhinitis 09/26/2015  . Muscle spasms of neck 06/19/2015  . Insomnia 06/19/2015  . Osteoarthritis, knee 06/17/2015  . Depression, major, recurrent, mild ([place]) 06/17/2015  . Diabetes mellitus type 2, uncontrolled ([place]) 06/17/2015  . Dyslipidemia 06/17/2015  . Benign hypertension 06/17/2015  . Morbid obesity with BMI of 40.0-44.9, adult ([place]) 06/17/2015  . Vitamin D deficiency 06/17/2015    Past Surgical History:  Procedure Laterality Date  . BREAST REDUCTION SURGERY    . nasal endoscopic    . OOPHORECTOMY  ?   one ovary removed   . REDUCTION MAMMAPLASTY Bilateral 2000  . removal of ovary      Family History  Problem Relation Age of  Onset  . Diabetes Mother   . Cancer Mother 52       Breast  . Breast cancer Mother 5  . Gout Mother   . Diabetes Daughter        Oldest Daughter  . Stroke Maternal Uncle   . Breast cancer Maternal Aunt 40  . Diabetes Father   . Kidney cancer Maternal Aunt   . Kidney disease Maternal Aunt     Social History   Socioeconomic History  . Marital status: Single    Spouse name: Not on file  . Number of children: 2  . Years of education: Not on file  . Highest education level: Some college, no degree  Occupational History  . Occupation: Designer, television/film set   Social Needs  . Financial resource strain: Somewhat hard  . Food insecurity:     Worry: Never true    Inability: Never true  . Transportation needs:    Medical: No    Non-medical: No  Tobacco Use  . Smoking status: Never Smoker  . Smokeless tobacco: Never Used  Substance and Sexual Activity  . Alcohol use: No    Alcohol/week: 0.0 standard drinks  . Drug use: No  . Sexual activity: Yes    Partners: Male    Birth control/protection: None  Lifestyle  . Physical activity:    Days per week: 3 days    Minutes per session: 20 min  . Stress: Very much  Relationships  . Social connections:    Talks on phone: More than three times a week    Gets together: Once a week    Attends religious service: More than 4 times per year    Active member of club or organization: No    Attends meetings of clubs or organizations: Never    Relationship status: Divorced  . Intimate partner violence:    Fear of current or ex partner: No    Emotionally abused: No    Physically abused: No    Forced sexual activity: No  Other Topics Concern  . Not on file  Social History Narrative   Divorced and he died since   Working at Glass blower/designer.      Current Outpatient Medications:  .  ALPRAZolam (ALPRAZOLAM XR) 0.5 MG 24 hr tablet, Take 1 tablet (0.5 mg total) by mouth daily., Disp: 30 tablet, Rfl: 2 .  aspirin 81 MG tablet, Take 81 mg by mouth daily., Disp: , Rfl:  .  cholecalciferol (VITAMIN D) 1000 UNITS tablet, Take 1,000 Units by mouth 2 (two) times daily.  , Disp: , Rfl:  .  diclofenac sodium (VOLTAREN) 1 % GEL, Apply 2 g topically 4 (four) times daily. (Patient not taking: Reported on 08/11/2018), Disp: 100 g, Rfl: 2 .  diphenhydrAMINE-APAP 25-500 & 500 MG MISC, Take 1 tablet by mouth every evening., Disp: 30 each, Rfl: 0 .  fluticasone (FLONASE) 50 MCG/ACT nasal spray, USE 2 SPRAY(S) IN EACH NOSTRIL ONCE DAILY (Patient not taking: Reported on 08/11/2018), Disp: 17 g, Rfl: 0 .  glucose blood test strip, Use as instructed, Disp: 100 each, Rfl: 12 .  Insulin Degludec-Liraglutide  (XULTOPHY) 100-3.6 UNIT-MG/ML SOPN, Inject 50 Units into the skin daily., Disp: 15 mL, Rfl: 2 .  Insulin Pen Needle 32G X 6 MM MISC, 1 each by Does not apply route daily., Disp: 100 each, Rfl: 1 .  lisinopril-hydrochlorothiazide (PRINZIDE,ZESTORETIC) 10-12.5 MG tablet, Take 1 tablet by mouth daily., Disp: 90 tablet, Rfl: 1 .  magnesium 30 MG  tablet, Take 30 mg by mouth., Disp: , Rfl:  .  metFORMIN (GLUCOPHAGE-XR) 750 MG 24 hr tablet, Take 2 tablets (1,500 mg total) by mouth daily with breakfast., Disp: 180 tablet, Rfl: 1 .  montelukast (SINGULAIR) 10 MG tablet, Take 1 tablet by mouth daily., Disp: , Rfl:  .  Multiple Vitamin (MULITIVITAMIN WITH MINERALS) TABS, Take 1 tablet by mouth daily.  , Disp: , Rfl:  .  ONETOUCH DELICA LANCETS FINE MISC, 1 Units by Does not apply route 2 (two) times daily., Disp: 100 each, Rfl: 2 .  Pitavastatin Calcium (LIVALO) 2 MG TABS, Take 1 tablet (2 mg total) by mouth daily., Disp: 90 tablet, Rfl: 1  Allergies  Allergen Reactions  . Atorvastatin     and pravastatin caused muscle cramps, also simvastatin  . Baclofen     spasms  . Compazine Other (See Comments)    Twitching and spasms of neck muscles  . Penicillins     rash    I personally reviewed active problem list, medication list, allergies, family history, social history with the patient/caregiver today.   ROS  Constitutional: Negative for fever or weight change.  Respiratory: Negative for cough and shortness of breath.   Cardiovascular: Negative for chest pain or palpitations.  Gastrointestinal: Negative for abdominal pain, no bowel changes.  Musculoskeletal: Negative for gait problemNeurological: Negative for dizziness or headache.  or joint swelling.  Skin: Negative for rash.   No other specific complaints in a complete review of systems (except as listed in HPI above).  Objective  Vitals:   09/28/18 1435  BP: 122/78  Pulse: 87  Resp: 16  Temp: 97.9 F (36.6 C)  TempSrc: Oral  SpO2:  97%  Weight: 278 lb 1.6 oz (126.1 kg)  Height: 5\' 5"  (1.651 m)    Body mass index is 46.28 kg/m.  Physical Exam  Constitutional: Patient appears well-developed and well-nourished. Obese No distress.  HEENT: head atraumatic, normocephalic, pupils equal and reactive to light, ears normal TM bilaterally,  neck supple, throat within normal limits Cardiovascular: Normal rate, regular rhythm and normal heart sounds.  No murmur heard. No BLE edema. Pulmonary/Chest: Effort normal and breath sounds normal. No respiratory distress. Abdominal: Soft.  There is no tenderness. Psychiatric: Patient has a normal mood and affect. behavior is normal. Judgment and thought content normal.  PHQ2/9: Depression screen [facility] 2/9 09/28/2018 08/11/2018 06/02/2018 01/04/2018 06/23/2017  Decreased Interest 1 1 2  0 0  Down, Depressed, Hopeless 0 0 2 1 0  PHQ - 2 Score 1 1 4 1  0  Altered sleeping 1 1 1 1  -  Tired, decreased energy 0 1 1 1  -  Change in appetite 1 1 2 1  -  Feeling bad or failure about yourself  - 0 1 0 -  Trouble concentrating 0 0 0 0 -  Moving slowly or fidgety/restless 0 0 0 1 -  Suicidal thoughts 0 0 0 0 -  PHQ-9 Score 3 4 9 5  -  Difficult doing work/chores Somewhat difficult Not difficult at all Somewhat difficult Somewhat difficult -     Fall Risk: Fall Risk  06/02/2018 03/18/2018 01/04/2018 12/02/2017 06/23/2017  Falls in the past year? No No No No No     Functional Status Survey: Is the patient deaf or have difficulty hearing?: No Does the patient have difficulty seeing, even when wearing glasses/contacts?: No Does the patient have difficulty concentrating, remembering, or making decisions?: No Does the patient have difficulty walking or climbing stairs?: No Does  the patient have difficulty dressing or bathing?: No Does the patient have difficulty doing errands alone such as visiting a doctor's office or shopping?: No    Assessment & Plan  1. Diabetic peripheral neuropathy associated  with type 2 diabetes mellitus (HCC)  - POCT HgB A1C - metFORMIN (GLUCOPHAGE-XR) 750 MG 24 hr tablet; Take 2 tablets (1,500 mg total) by mouth daily with breakfast.  Dispense: 180 tablet; Refill: 1  2. Need for influenza vaccination  - Flu Vaccine QUAD 6+ mos PF IM (Fluarix Quad PF)   3. Depression, major, recurrent, mild ([place])  Stopped SSRI on her own  4. GAD (generalized anxiety disorder)  - ALPRAZolam (ALPRAZOLAM XR) 0.5 MG 24 hr tablet; Take 1 tablet (0.5 mg total) by mouth daily.  Dispense: 30 tablet; Refill: 2  5. Morbid obesity with BMI of 40.0-44.9, adult [facility])  Discussed with the patient the risk posed by an increased BMI. Discussed importance of portion control, calorie counting and at least 150 minutes of physical activity weekly. Avoid sweet beverages and drink more water. Eat at least 6 servings of fruit and vegetables daily    6. Benign hypertension  - lisinopril-hydrochlorothiazide (PRINZIDE,ZESTORETIC) 10-12.5 MG tablet; Take 1 tablet by mouth daily.  Dispense: 90 tablet; Refill: 1  7. Dyslipidemia  - Pitavastatin Calcium (LIVALO) 2 MG TABS; Take 1 tablet (2 mg total) by mouth daily.  Dispense: 90 tablet; Refill: 1  8. Primary insomnia  - diphenhydrAMINE-APAP 25-500 & 500 MG MISC; Take 1 tablet by mouth every evening.  Dispense: 30 each; Refill: 0

## 2018-10-16 IMAGING — CR DG CERVICAL SPINE 2 OR 3 VIEWS
1 series · 4 of 4 positions shown · non-contrast
Comparison: None

CLINICAL DATA: Radicular pain to left upper extremity.

EXAM:
CERVICAL SPINE - 2-3 VIEW

[Series 1: dg cervical spine 2 or 3 views · 0.14mm/px · 4 of 4 slices shown]
[im 1/4]
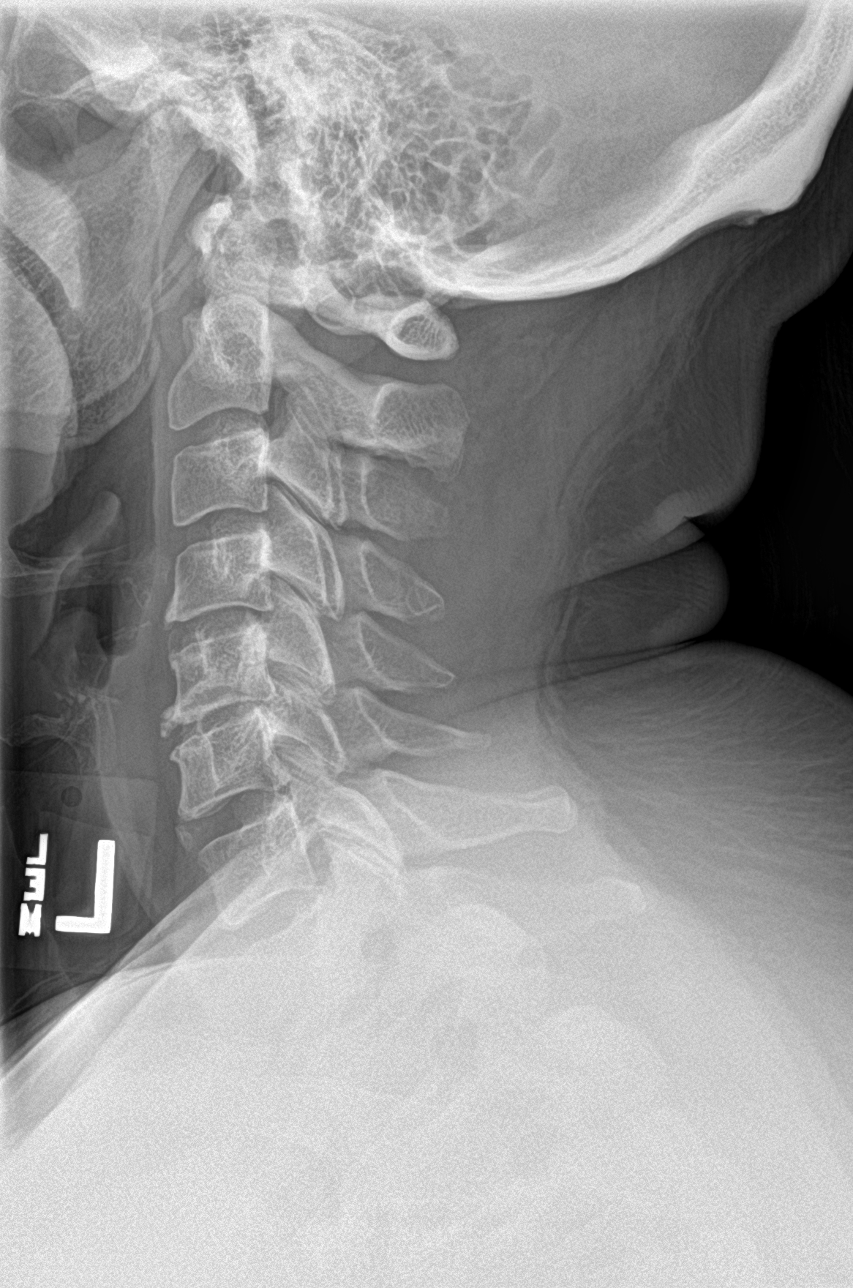
[im 2/4]
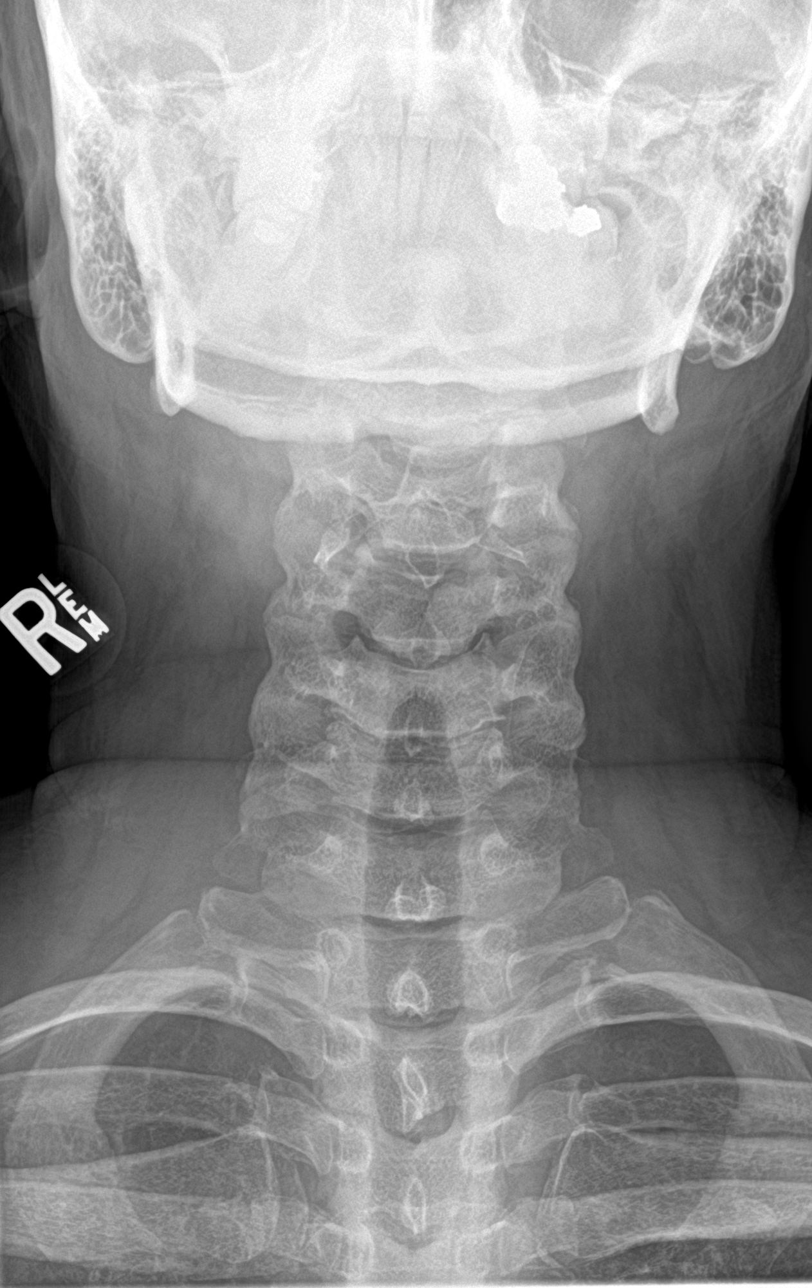
[im 3/4]
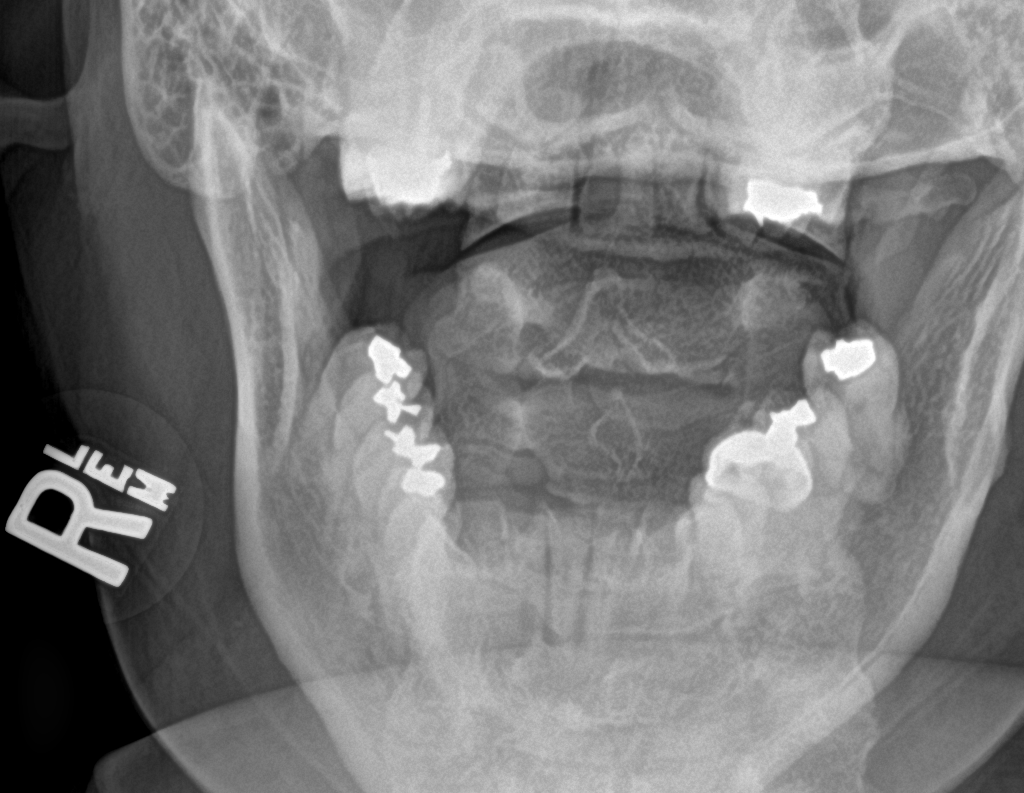
[im 4/4]
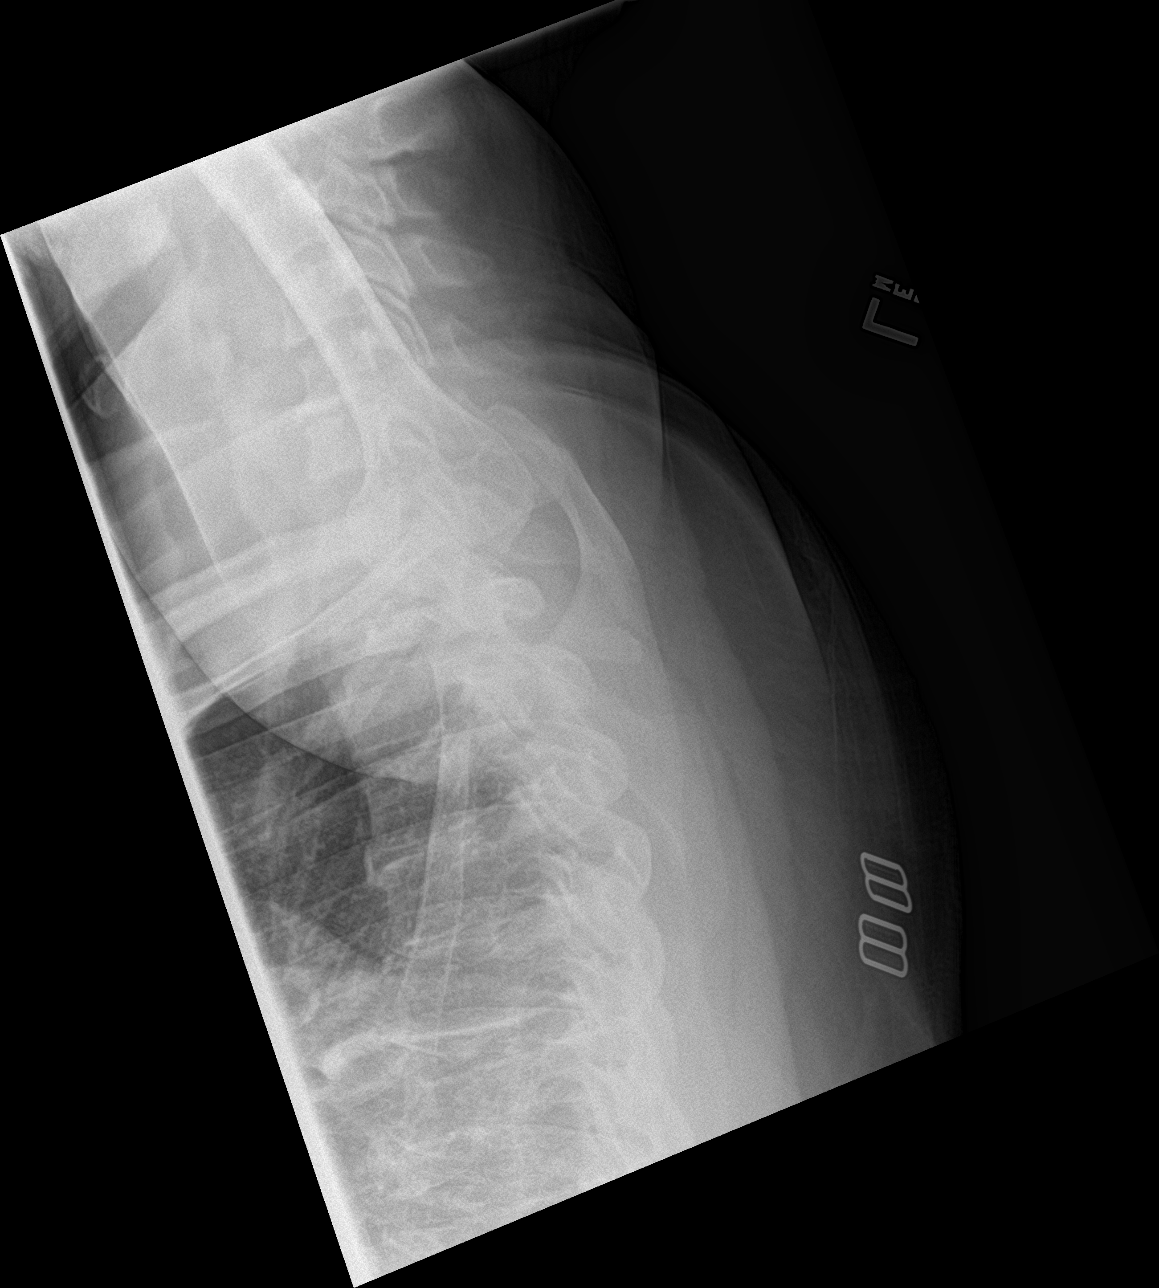

[4 of 4 positions shown; findings below may reference images not displayed]

FINDINGS: There is no evidence of cervical spine fracture or prevertebral soft
tissue swelling. Alignment is normal. Degenerative disc disease is
identified at C5-6. No other significant bone abnormalities are
identified.
IMPRESSION: 1. No acute findings.
2. C5-6 degenerative disc disease.

## 2018-10-18 ENCOUNTER — Other Ambulatory Visit: Payer: Self-pay | Admitting: Family Medicine

## 2018-10-18 DIAGNOSIS — E1142 Type 2 diabetes mellitus with diabetic polyneuropathy: Secondary | ICD-10-CM

## 2018-11-03 ENCOUNTER — Encounter: Payer: Self-pay | Admitting: Family Medicine

## 2018-12-12 ENCOUNTER — Encounter: Payer: Self-pay | Admitting: Family Medicine

## 2018-12-25 ENCOUNTER — Other Ambulatory Visit: Payer: Self-pay | Admitting: Family Medicine

## 2018-12-25 DIAGNOSIS — F411 Generalized anxiety disorder: Secondary | ICD-10-CM

## 2018-12-27 ENCOUNTER — Other Ambulatory Visit: Payer: Self-pay | Admitting: Family Medicine

## 2018-12-27 DIAGNOSIS — F411 Generalized anxiety disorder: Secondary | ICD-10-CM

## 2018-12-27 NOTE — Telephone Encounter (Signed)
Copied from [place] [id number]. Topic: Quick Communication - Rx Refill/Question >> Dec 27, 2018  9:42 AM Yvette Rack wrote: Medication: ALPRAZolam (ALPRAZOLAM XR) 0.5 MG 24 hr tablet  Has the patient contacted their pharmacy? no  Preferred Pharmacy (with phone number or street name): [facility] [address] [place] - [address] [phone] (Phone)  [phone] (Fax)  Agent: Please be advised that RX refills may take up to 3 business days. We ask that you follow-up with your pharmacy.

## 2018-12-31 ENCOUNTER — Encounter: Payer: Self-pay | Admitting: Family Medicine

## 2018-12-31 ENCOUNTER — Ambulatory Visit: Payer: BLUE CROSS/BLUE SHIELD | Admitting: Family Medicine

## 2018-12-31 VITALS — BP 130/64 | HR 98 | Temp 97.8°F | Resp 16 | Ht 65.0 in | Wt 278.4 lb

## 2018-12-31 DIAGNOSIS — I10 Essential (primary) hypertension: Secondary | ICD-10-CM

## 2018-12-31 DIAGNOSIS — F5101 Primary insomnia: Secondary | ICD-10-CM

## 2018-12-31 DIAGNOSIS — F330 Major depressive disorder, recurrent, mild: Secondary | ICD-10-CM

## 2018-12-31 DIAGNOSIS — E1142 Type 2 diabetes mellitus with diabetic polyneuropathy: Secondary | ICD-10-CM | POA: Diagnosis not present

## 2018-12-31 DIAGNOSIS — Z1211 Encounter for screening for malignant neoplasm of colon: Secondary | ICD-10-CM | POA: Diagnosis not present

## 2018-12-31 DIAGNOSIS — Z6841 Body Mass Index (BMI) 40.0 and over, adult: Secondary | ICD-10-CM

## 2018-12-31 DIAGNOSIS — F411 Generalized anxiety disorder: Secondary | ICD-10-CM

## 2018-12-31 DIAGNOSIS — M797 Fibromyalgia: Secondary | ICD-10-CM

## 2018-12-31 DIAGNOSIS — M2550 Pain in unspecified joint: Secondary | ICD-10-CM

## 2018-12-31 LAB — POCT GLYCOSYLATED HEMOGLOBIN (HGB A1C)
HBA1C, POC (CONTROLLED DIABETIC RANGE): 7 % (ref 0.0–7.0)
HbA1c POC (<> result, manual entry): 7 % (ref 4.0–5.6)
HbA1c, POC (prediabetic range): 7 % — AB (ref 5.7–6.4)
Hemoglobin A1C: 7 % — AB (ref 4.0–5.6)

## 2018-12-31 MED ORDER — ALPRAZOLAM ER 0.5 MG PO TB24: 0.5000 mg | ORAL_TABLET | Freq: Every day | ORAL | 2 refills | Status: DC

## 2018-12-31 MED ORDER — PREGABALIN 75 MG PO CAPS: 75.0000 mg | ORAL_CAPSULE | Freq: Two times a day (BID) | ORAL | 0 refills | Status: DC

## 2018-12-31 NOTE — Patient Instructions (Signed)
Myofascial Pain Syndrome and Fibromyalgia Myofascial pain syndrome and fibromyalgia are both pain disorders. This pain may be felt mainly in your muscles.  Myofascial pain syndrome: ? Always has tender points in the muscle that will cause pain when pressed (trigger points). The pain may come and go. ? Usually affects your neck, upper back, and shoulder areas. The pain often radiates into your arms and hands.  Fibromyalgia: ? Has muscle pains and tenderness that come and go. ? Is often associated with fatigue and sleep problems. ? Has trigger points. ? Tends to be long-lasting (chronic), but is not life-threatening. Fibromyalgia and myofascial pain syndrome are not the same. However, they often occur together. If you have both conditions, each can make the other worse. Both are common and can cause enough pain and fatigue to make day-to-day activities difficult. Both can be hard to diagnose because their symptoms are common in many other conditions. What are the causes? The exact causes of these conditions are not known. What increases the risk? You are more likely to develop this condition if:  You have a family history of the condition.  You have certain triggers, such as: ? Spine disorders. ? An injury (trauma) or other physical stressors. ? Being under a lot of stress. ? Medical conditions such as osteoarthritis, rheumatoid arthritis, or lupus. What are the signs or symptoms? Fibromyalgia The main symptom of fibromyalgia is widespread pain and tenderness in your muscles. Pain is sometimes described as stabbing, shooting, or burning. You may also have:  Tingling or numbness.  Sleep problems and fatigue.  Problems with attention and concentration (fibro fog). Other symptoms may include:  Bowel and bladder problems.  Headaches.  Visual problems.  Problems with odors and noises.  Depression or mood changes.  Painful menstrual periods (dysmenorrhea).  Dry skin or eyes.  These symptoms can vary over time. Myofascial pain syndrome Symptoms of myofascial pain syndrome include:  Tight, ropy bands of muscle.  Uncomfortable sensations in muscle areas. These may include aching, cramping, burning, numbness, tingling, and weakness.  Difficulty moving certain parts of the body freely (poor range of motion). How is this diagnosed? This condition may be diagnosed by your symptoms and medical history. You will also have a physical exam. In general:  Fibromyalgia is diagnosed if you have pain, fatigue, and other symptoms for more than 3 months, and symptoms cannot be explained by another condition.  Myofascial pain syndrome is diagnosed if you have trigger points in your muscles, and those trigger points are tender and cause pain elsewhere in your body (referred pain). How is this treated? Treatment for these conditions depends on the type that you have.  For fibromyalgia: ? Pain medicines, such as NSAIDs. ? Medicines for treating depression. ? Medicines for treating seizures. ? Medicines that relax the muscles.  For myofascial pain: ? Pain medicines, such as NSAIDs. ? Cooling and stretching of muscles. ? Trigger point injections. ? Sound wave (ultrasound) treatments to stimulate muscles. Treating these conditions often requires a team of health care providers. These may include:  Your primary care provider.  Physical therapist.  Complementary health care providers, such as massage therapists or acupuncturists.  Psychiatrist for cognitive behavioral therapy. Follow these instructions at home: Medicines  Take over-the-counter and prescription medicines only as told by your health care provider.  Do not drive or use heavy machinery while taking prescription pain medicine.  If you are taking prescription pain medicine, take actions to prevent or treat constipation. Your health care   provider may recommend that you: ? Drink enough fluid to keep your  urine pale yellow. ? Eat foods that are high in fiber, such as fresh fruits and vegetables, whole grains, and beans. ? Limit foods that are high in fat and processed sugars, such as fried or sweet foods. ? Take an over-the-counter or prescription medicine for constipation. Lifestyle   Exercise as directed by your health care provider or physical therapist.  Practice relaxation techniques to control your stress. You may want to try: ? Biofeedback. ? Visual imagery. ? Hypnosis. ? Muscle relaxation. ? Yoga. ? Meditation.  Maintain a healthy lifestyle. This includes eating a healthy diet and getting enough sleep.  Do not use any products that contain nicotine or tobacco, such as cigarettes and e-cigarettes. If you need help quitting, ask your health care provider. General instructions  Talk to your health care provider about complementary treatments, such as acupuncture or massage.  Consider joining a support group with others who are diagnosed with this condition.  Do not do activities that stress or strain your muscles. This includes repetitive motions and heavy lifting.  Keep all follow-up visits as told by your health care provider. This is important. Where to find more information  National Fibromyalgia Association: www.fmaware.org  Arthritis Foundation: www.arthritis.org  American Chronic Pain Association: www.theacpa.org Contact a health care provider if:  You have new symptoms.  Your symptoms get worse or your pain is severe.  You have side effects from your medicines.  You have trouble sleeping.  Your condition is causing depression or anxiety. Summary  Myofascial pain syndrome and fibromyalgia are pain disorders.  Myofascial pain syndrome has tender points in the muscle that will cause pain when pressed (trigger points). Fibromyalgia also has muscle pains and tenderness that come and go, but this condition is often associated with fatigue and sleep  disturbances.  Fibromyalgia and myofascial pain syndrome are not the same but often occur together, causing pain and fatigue that make day-to-day activities difficult.  Treatment for fibromyalgia includes taking medicines to relax the muscles and medicines for pain, depression, or seizures. Treatment for myofascial pain syndrome includes taking medicines for pain, cooling and stretching of muscles, and injecting medicines into trigger points.  Follow your health care provider's instructions for taking medicines and maintaining a healthy lifestyle. This information is not intended to replace advice given to you by your health care provider. Make sure you discuss any questions you have with your health care provider. Document Released: 12/08/2005 Document Revised: 12/23/2017 Document Reviewed: 12/23/2017 Elsevier Interactive Patient Education  2019 [employer]  

## 2018-12-31 NOTE — Progress Notes (Signed)
Name: Lindsay Mcdonald   MRN: [id number]    DOB: [birth year]   Date:12/31/2018       Progress Note  Subjective  Chief Complaint  Chief Complaint  Patient presents with  . Diabetes  . Hypertension  . Depression  . Joint Pain  . Eye Problem    Her eye continues to twitch on and off. Not resolved after the recommendations you gave her.    HPI  FMS: she has a long history of feeling aching and tender all over, but worse lately . She moved in with her daughter and is now sleeping in an air mattress or sofa. Pain on shoulders, arms, stiff on her hips, no effusion of joints . Feels a little stiff when first gets up, she has some mental fogginess.   DMII: on Xultophy 50 units daily,also Metformin most days of the week,  not checking glucose at home. HbgA1C down from 8.8%, to 7.9%, last visit was 7.6% but now at 7% .She states no recent symptoms of neuropathy, but occasionally has sharp pain on her feet,on ARB but last urine micro was normal. Eye exam is up to date. Continue current regiment   HTN: taking lisinopril/HCTZ without any side effects, denies chest pain,SOB palpitationor dizziness.BP is at goal   Hyperlipidemia; taking Livalo most days, no myalgia with it, recheck labs next time   Vascular Malformation: she states she has a group of vessels on right outer thigh, that she noticed her 70 's, not growing or painful, She had an exam done by vascular surgeon,decided not to do anything about it at this time. Stable   Major Depression with anxiety: she took Zoloft in the past, states Cymbalta caused "weird feeling ", she was afraid to start Lexapro because of side effects. She is also worried about depending on SSRI, but not bothered by Alprazolam XR. Explained that BZD's are controlled and she should try to start SSRI to be able to come off BZD. Shetried taking Prozac for one month Summer of 2019 however did not noticed improvement of symptoms of depression or anxiety and stopped it  on her own  Symptoms are stable, moved in with her daughter because of financial difficulties, and states it has helped her emotionally.   Morbid Obesity: weight is stable, diabetes is at goal, but not compliant with diet   Patient Active Problem List   Diagnosis Date Noted  . Fibromyalgia 12/31/2018  . Venous vascular malformations 03/27/2016  . Diabetic peripheral neuropathy associated with type 2 diabetes mellitus ([place]) 09/26/2015  . Non compliance w medication regimen 09/26/2015  . Seasonal allergic rhinitis 09/26/2015  . Muscle spasms of neck 06/19/2015  . Insomnia 06/19/2015  . Primary osteoarthritis of right knee 06/17/2015  . Depression, major, recurrent, mild ([place]) 06/17/2015  . Diabetes mellitus type 2, uncontrolled ([place]) 06/17/2015  . Dyslipidemia 06/17/2015  . Benign hypertension 06/17/2015  . Morbid obesity with BMI of 40.0-44.9, adult ([place]) 06/17/2015  . Vitamin D deficiency 06/17/2015    Past Surgical History:  Procedure Laterality Date  . BREAST REDUCTION SURGERY    . nasal endoscopic    . OOPHORECTOMY  ?   one ovary removed   . REDUCTION MAMMAPLASTY Bilateral 2000  . removal of ovary      Family History  Problem Relation Age of Onset  . Diabetes Mother   . Cancer Mother 21       Breast  . Breast cancer Mother 71  . Gout Mother   . Diabetes Daughter  Oldest Daughter  . Stroke Maternal Uncle   . Breast cancer Maternal Aunt 51  . Diabetes Father   . Kidney cancer Maternal Aunt   . Kidney disease Maternal Aunt     Social History   Socioeconomic History  . Marital status: Single    Spouse name: Not on file  . Number of children: 2  . Years of education: Not on file  . Highest education level: Some college, no degree  Occupational History  . Occupation: Designer, television/film set   Social Needs  . Financial resource strain: Somewhat hard  . Food insecurity:    Worry: Never true    Inability: Never true  . Transportation needs:    Medical: No     Non-medical: No  Tobacco Use  . Smoking status: Never Smoker  . Smokeless tobacco: Never Used  Substance and Sexual Activity  . Alcohol use: No    Alcohol/week: 0.0 standard drinks  . Drug use: No  . Sexual activity: Yes    Partners: Male    Birth control/protection: None  Lifestyle  . Physical activity:    Days per week: 3 days    Minutes per session: 20 min  . Stress: Very much  Relationships  . Social connections:    Talks on phone: More than three times a week    Gets together: Once a week    Attends religious service: More than 4 times per year    Active member of club or organization: No    Attends meetings of clubs or organizations: Never    Relationship status: Divorced  . Intimate partner violence:    Fear of current or ex partner: No    Emotionally abused: No    Physically abused: No    Forced sexual activity: No  Other Topics Concern  . Not on file  Social History Narrative   Divorced and he died since   Working at [employer]/design[employer].      Current Outpatient Medications:  .  ALPRAZolam (ALPRAZOLAM XR) 0.5 MG 24 hr tablet, Take 1 tablet (0.5 mg total) by mouth daily., Disp: 30 tablet, Rfl: 2 .  aspirin 81 MG tablet, Take 81 mg by mouth daily., Disp: , Rfl:  .  cholecalciferol (VITAMIN D) 1000 UNITS tablet, Take 1,000 Units by mouth 2 (two) times daily.  , Disp: , Rfl:  .  diclofenac sodium (VOLTAREN) 1 % GEL, Apply 2 g topically 4 (four) times daily. (Patient not taking: Reported on 08/11/2018), Disp: 100 g, Rfl: 2 .  diphenhydrAMINE-APAP 25-500 & 500 MG MISC, Take 1 tablet by mouth every evening., Disp: 30 each, Rfl: 0 .  fluticasone (FLONASE) 50 MCG/ACT nasal spray, USE 2 SPRAY(S) IN EACH NOSTRIL ONCE DAILY (Patient not taking: Reported on 08/11/2018), Disp: 17 g, Rfl: 0 .  glucose blood test strip, Use as instructed, Disp: 100 each, Rfl: 12 .  Insulin Pen Needle 32G X 6 MM MISC, 1 each by Does not apply route daily., Disp: 100 each, Rfl: 1 .   lisinopril-hydrochlorothiazide (PRINZIDE,ZESTORETIC) 10-12.5 MG tablet, Take 1 tablet by mouth daily., Disp: 90 tablet, Rfl: 1 .  magnesium 30 MG tablet, Take 30 mg by mouth., Disp: , Rfl:  .  metFORMIN (GLUCOPHAGE-XR) 750 MG 24 hr tablet, Take 2 tablets (1,500 mg total) by mouth daily with breakfast., Disp: 180 tablet, Rfl: 1 .  montelukast (SINGULAIR) 10 MG tablet, Take 1 tablet by mouth daily., Disp: , Rfl:  .  Multiple Vitamin (MULITIVITAMIN WITH MINERALS) TABS,  Take 1 tablet by mouth daily.  , Disp: , Rfl:  .  ONETOUCH DELICA LANCETS FINE MISC, 1 Units by Does not apply route 2 (two) times daily., Disp: 100 each, Rfl: 2 .  Pitavastatin Calcium (LIVALO) 2 MG TABS, Take 1 tablet (2 mg total) by mouth daily., Disp: 90 tablet, Rfl: 1 .  XULTOPHY 100-3.6 UNIT-MG/ML SOPN, INJECT 50 UNITS INTO THE SKIN DAILY, Disp: 15 mL, Rfl: 2  Allergies  Allergen Reactions  . Atorvastatin     and pravastatin caused muscle cramps, also simvastatin  . Baclofen     spasms  . Compazine Other (See Comments)    Twitching and spasms of neck muscles  . Penicillins     rash    I personally reviewed active problem list, medication list, allergies, family history, social history with the patient/caregiver today.   ROS  Constitutional: Negative for fever or weight change.  Respiratory: Negative for cough and shortness of breath.   Cardiovascular: Negative for chest pain or palpitations.  Gastrointestinal: Negative for abdominal pain, no bowel changes.  Musculoskeletal: Negative for gait problem or joint swelling.  Skin: Negative for rash.  Neurological: Negative for dizziness or headache.  No other specific complaints in a complete review of systems (except as listed in HPI above).  Objective  Vitals:   12/31/18 1142  BP: 130/64  Pulse: 98  Resp: 16  Temp: 97.8 F (36.6 C)  TempSrc: Oral  SpO2: 98%  Weight: 278 lb 6.4 oz (126.3 kg)  Height: 5\' 5"  (1.651 m)    Body mass index is 46.33  kg/m.  Physical Exam  Constitutional: Patient appears well-developed and well-nourished. Obese  No distress.  HEENT: head atraumatic, normocephalic, pupils equal and reactive to light,  neck supple, throat within normal limits Cardiovascular: Normal rate, regular rhythm and normal heart sounds.  No murmur heard. No BLE edema. Pulmonary/Chest: Effort normal and breath sounds normal. No respiratory distress. Abdominal: Soft.  There is no tenderness. Muscular Skeletal: trigger point positive Psychiatric: Patient has a normal mood and affect. behavior is normal. Judgment and thought content normal.  Recent Results (from the past 2160 hour(s))  POCT HgB A1C     Status: Abnormal   Collection Time: 12/31/18 11:49 AM  Result Value Ref Range   Hemoglobin A1C 7.0 (A) 4.0 - 5.6 %   HbA1c POC (<> result, manual entry) 7.0 4.0 - 5.6 %   HbA1c, POC (prediabetic range) 7.0 (A) 5.7 - 6.4 %   HbA1c, POC (controlled diabetic range) 7.0 0.0 - 7.0 %      PHQ2/9: Depression screen [facility] 2/9 12/31/2018 09/28/2018 08/11/2018 06/02/2018 01/04/2018  Decreased Interest 1 1 1 2  0  Down, Depressed, Hopeless 1 0 0 2 1  PHQ - 2 Score 2 1 1 4 1   Altered sleeping 2 1 1 1 1   Tired, decreased energy 1 0 1 1 1   Change in appetite 1 1 1 2 1   Feeling bad or failure about yourself  1 - 0 1 0  Trouble concentrating 0 0 0 0 0  Moving slowly or fidgety/restless 0 0 0 0 1  Suicidal thoughts 0 0 0 0 0  PHQ-9 Score 7 3 4 9 5   Difficult doing work/chores Somewhat difficult Somewhat difficult Not difficult at all Somewhat difficult Somewhat difficult   GAD 7 : Generalized Anxiety Score 12/31/2018 09/28/2018 08/11/2018 01/04/2018  Nervous, Anxious, on Edge 1 - 1 0  Control/stop worrying 1 1 1 1   Worry too much -  different things 1 1 1 1   Trouble relaxing 1 1 1 1   Restless 1 0 0 0  Easily annoyed or irritable 0 1 1 0  Afraid - awful might happen 1 - 2 0  Total GAD 7 Score 6 - 7 3  Anxiety Difficulty - - Somewhat difficult  Somewhat difficult     Fall Risk: Fall Risk  06/02/2018 03/18/2018 01/04/2018 12/02/2017 06/23/2017  Falls in the past year? No No No No No     Assessment & Plan  1. Diabetic peripheral neuropathy associated with type 2 diabetes mellitus (HCC)  - POCT HgB A1C  2. Colon cancer screening  - Cologuard  3. Morbid obesity with BMI of 40.0-44.9, adult [facility])  Discussed with the patient the risk posed by an increased BMI. Discussed importance of portion control, calorie counting and at least 150 minutes of physical activity weekly. Avoid sweet beverages and drink more water. Eat at least 6 servings of fruit and vegetables daily   4. Depression, major, recurrent, mild ([place])   5. Benign hypertension  bp is at goal   6. GAD (generalized anxiety disorder)  - ALPRAZolam (ALPRAZOLAM XR) 0.5 MG 24 hr tablet; Take 1 tablet (0.5 mg total) by mouth daily.  Dispense: 30 tablet; Refill: 2 - pregabalin (LYRICA) 75 MG capsule; Take 1 capsule (75 mg total) by mouth 2 (two) times daily.  Dispense: 180 capsule; Refill: 0  7. Primary insomnia  Not very good lately, moved in with her daughter because she was struggling financially   8. Arthralgia, unspecified joint  We will try treating for FMS and if no improvement refer to Ortho/Rheumatologist   9. Fibromyalgia  - pregabalin (LYRICA) 75 MG capsule; Take 1 capsule (75 mg total) by mouth 2 (two) times daily.  Dispense: 180 capsule; Refill: 0

## 2019-01-05 ENCOUNTER — Encounter: Payer: Self-pay | Admitting: Family Medicine

## 2019-01-05 ENCOUNTER — Ambulatory Visit: Payer: BLUE CROSS/BLUE SHIELD | Admitting: Family Medicine

## 2019-01-12 DIAGNOSIS — Z1211 Encounter for screening for malignant neoplasm of colon: Secondary | ICD-10-CM | POA: Diagnosis not present

## 2019-01-14 ENCOUNTER — Other Ambulatory Visit: Payer: Self-pay | Admitting: Family Medicine

## 2019-01-14 DIAGNOSIS — E1142 Type 2 diabetes mellitus with diabetic polyneuropathy: Secondary | ICD-10-CM

## 2019-01-14 NOTE — Telephone Encounter (Signed)
Refill request for diabetic medication:   Xultophy 100-3.6 units   Last office visit pertaining to diabetes: 12/31/2018    Lab Results  Component Value Date   HGBA1C 7.0 (A) 12/31/2018   HGBA1C 7.0 12/31/2018   HGBA1C 7.0 (A) 12/31/2018   HGBA1C 7.0 12/31/2018    Follow-ups on file. 03/30/2019

## 2019-01-15 LAB — COLOGUARD: Cologuard: NEGATIVE

## 2019-01-23 ENCOUNTER — Emergency Department
Admission: EM | Admit: 2019-01-23 | Discharge: 2019-01-23 | Disposition: A | Discharge status: Home / Self Care | Payer: BLUE CROSS/BLUE SHIELD | Attending: Student in an Organized Health Care Education/Training Program | Admitting: Student in an Organized Health Care Education/Training Program

## 2019-01-23 ENCOUNTER — Other Ambulatory Visit: Payer: Self-pay

## 2019-01-23 DIAGNOSIS — B9689 Other specified bacterial agents as the cause of diseases classified elsewhere: Secondary | ICD-10-CM | POA: Diagnosis not present

## 2019-01-23 DIAGNOSIS — H10021 Other mucopurulent conjunctivitis, right eye: Secondary | ICD-10-CM | POA: Diagnosis not present

## 2019-01-23 DIAGNOSIS — E119 Type 2 diabetes mellitus without complications: Secondary | ICD-10-CM | POA: Diagnosis not present

## 2019-01-23 DIAGNOSIS — Z7982 Long term (current) use of aspirin: Secondary | ICD-10-CM | POA: Diagnosis not present

## 2019-01-23 DIAGNOSIS — I10 Essential (primary) hypertension: Secondary | ICD-10-CM | POA: Diagnosis not present

## 2019-01-23 DIAGNOSIS — Z794 Long term (current) use of insulin: Secondary | ICD-10-CM | POA: Insufficient documentation

## 2019-01-23 DIAGNOSIS — H5711 Ocular pain, right eye: Secondary | ICD-10-CM | POA: Diagnosis not present

## 2019-01-23 DIAGNOSIS — H1031 Unspecified acute conjunctivitis, right eye: Secondary | ICD-10-CM

## 2019-01-23 MED ORDER — TOBRAMYCIN 0.3 % OP SOLN: 2.0000 [drp] | OPHTHALMIC | 0 refills | Status: DC

## 2019-01-23 NOTE — ED Notes (Addendum)
Pt reports noticing redness to right eye this morning, pt states the eye has not been draining and was not matted shut this morning.  Pt states she did use some cleaner last night with bleach in it and isn't sure if that is affecting eye. Pt states she does feel like something is in her eye as well. Sclera is injected.  Left eye not affected per pt.

## 2019-01-23 NOTE — ED Triage Notes (Addendum)
Pt comes via POV from home with right eye pain. Pt states this started this am and redness noted.  Pt states some drainage. Pt states she recently has had cold.  Pt denies any blurred vision.

## 2019-01-23 NOTE — ED Provider Notes (Signed)
[facility] Emergency Department Provider Note  ____________________________________________   First MD Initiated Contact with Patient 01/23/19 1118     (approximate)  I have reviewed the triage vital signs and the nursing notes.   HISTORY  Chief Complaint Eye Pain    HPI Lindsay Mcdonald is a 58 y.o. female presents emergency department with redness to the right eye with matting.  Some drainage this morning.  She states symptoms started this morning.  No known trauma or foreign body.  She does not wear contacts.  She states she has had cold symptoms in which the mucus is been clear.  She denies any fever or chills.  No change in her vision.    Past Medical History:  Diagnosis Date  . Anxiety   . Chronic pain of right knee   . Depression   . Diabetes mellitus   . High cholesterol   . Hypertension   . Muscle pain   . Palpitations   . Vitamin D deficiency   . Vitamin D deficiency     Patient Active Problem List   Diagnosis Date Noted  . Fibromyalgia 12/31/2018  . Venous vascular malformations 03/27/2016  . Diabetic peripheral neuropathy associated with type 2 diabetes mellitus ([place]) 09/26/2015  . Non compliance w medication regimen 09/26/2015  . Seasonal allergic rhinitis 09/26/2015  . Muscle spasms of neck 06/19/2015  . Insomnia 06/19/2015  . Primary osteoarthritis of right knee 06/17/2015  . Depression, major, recurrent, mild ([place]) 06/17/2015  . Diabetes mellitus type 2, uncontrolled ([place]) 06/17/2015  . Dyslipidemia 06/17/2015  . Benign hypertension 06/17/2015  . Morbid obesity with BMI of 40.0-44.9, adult ([place]) 06/17/2015  . Vitamin D deficiency 06/17/2015    Past Surgical History:  Procedure Laterality Date  . BREAST REDUCTION SURGERY    . nasal endoscopic    . OOPHORECTOMY  ?   one ovary removed   . REDUCTION MAMMAPLASTY Bilateral 2000  . removal of ovary      Prior to Admission medications   Medication Sig Start Date End  Date Taking? Authorizing Provider  ALPRAZolam (ALPRAZOLAM XR) 0.5 MG 24 hr tablet Take 1 tablet (0.5 mg total) by mouth daily. 12/31/18   Steele Sizer, MD  aspirin 81 MG tablet Take 81 mg by mouth daily.    [provider]  cholecalciferol (VITAMIN D) 1000 UNITS tablet Take 1,000 Units by mouth 2 (two) times daily.      [provider]  diphenhydrAMINE-APAP 25-500 & 500 MG MISC Take 1 tablet by mouth every evening. 09/28/18   Steele Sizer, MD  glucose blood test strip Use as instructed 04/01/17   Steele Sizer, MD  Insulin Pen Needle 32G X 6 MM MISC 1 each by Does not apply route daily. 04/01/17   Steele Sizer, MD  lisinopril-hydrochlorothiazide (PRINZIDE,ZESTORETIC) 10-12.5 MG tablet Take 1 tablet by mouth daily. 09/28/18   Steele Sizer, MD  magnesium 30 MG tablet Take 30 mg by mouth.    [provider]  metFORMIN (GLUCOPHAGE-XR) 750 MG 24 hr tablet Take 2 tablets (1,500 mg total) by mouth daily with breakfast. 09/28/18   Ancil Boozer, Drue Stager, MD  montelukast (SINGULAIR) 10 MG tablet Take 1 tablet by mouth daily. 12/21/17   Carloyn Manner, MD  Multiple Vitamin (MULITIVITAMIN WITH MINERALS) TABS Take 1 tablet by mouth daily.      [provider]  [facility] DELICA LANCETS FINE MISC 1 Units by Does not apply route 2 (two) times daily. 04/01/17   Sowles, Drue Stager,  MD  Pitavastatin Calcium (LIVALO) 2 MG TABS Take 1 tablet (2 mg total) by mouth daily. 09/28/18   Steele Sizer, MD  pregabalin (LYRICA) 75 MG capsule Take 1 capsule (75 mg total) by mouth 2 (two) times daily. 12/31/18   Steele Sizer, MD  tobramycin (TOBREX) 0.3 % ophthalmic solution Place 2 drops into both eyes every 4 (four) hours. 01/23/19   Versie Starks, PA-C  XULTOPHY 100-3.6 UNIT-MG/ML SOPN INJECT 50 UNITS SUBCUTANEOUSLY DAILY 01/16/19   Steele Sizer, MD    Allergies Atorvastatin; Baclofen; Compazine; and Penicillins  Family History  Problem Relation Age of Onset  . Diabetes Mother     . Cancer Mother 7       Breast  . Breast cancer Mother 67  . Gout Mother   . Diabetes Daughter        Oldest Daughter  . Stroke Maternal Uncle   . Breast cancer Maternal Aunt 53  . Diabetes Father   . Kidney cancer Maternal Aunt   . Kidney disease Maternal Aunt     Social History Social History   Tobacco Use  . Smoking status: Never Smoker  . Smokeless tobacco: Never Used  Substance Use Topics  . Alcohol use: No    Alcohol/week: 0.0 standard drinks  . Drug use: No    Review of Systems  Constitutional: No fever/chills Eyes: No visual changes.  Positive for right eye redness ENT: No sore throat. Respiratory: Denies cough Genitourinary: Negative for dysuria. Musculoskeletal: Negative for back pain. Skin: Negative for rash.    ____________________________________________   PHYSICAL EXAM:  VITAL SIGNS: ED Triage Vitals [01/23/19 1055]  Enc Vitals Group     BP (!) 134/104     Pulse Rate 87     Resp 18     Temp 98.4 F (36.9 C)     Temp src      SpO2 99 %     Weight      Height      Head Circumference      Peak Flow      Pain Score 0     Pain Loc      Pain Edu?      Excl. in [place]?     Constitutional: Alert and oriented. Well appearing and in no acute distress. Eyes: Conjunctiva of the right eye is red with some matting noted on the lashes.  Left eye is normal. Head: Atraumatic. Nose: No congestion/rhinnorhea. Mouth/Throat: Mucous membranes are moist.   Neck:  supple no lymphadenopathy noted Cardiovascular: Normal rate, regular rhythm. Heart sounds are normal Respiratory: Normal respiratory effort.  No retractions, lungs c t a  GU: deferred Musculoskeletal: FROM all extremities, warm and well perfused Neurologic:  Normal speech and language.  Skin:  Skin is warm, dry and intact. No rash noted. Psychiatric: Mood and affect are normal. Speech and behavior are normal.  ____________________________________________   LABS (all labs ordered are  listed, but only abnormal results are displayed)  Labs Reviewed - No data to display ____________________________________________   ____________________________________________  RADIOLOGY    ____________________________________________   PROCEDURES  Procedure(s) performed: No  Procedures    ____________________________________________   INITIAL IMPRESSION / ASSESSMENT AND PLAN / ED COURSE  Pertinent labs & imaging results that were available during my care of the patient were reviewed by me and considered in my medical decision making (see chart for details).   Patient is a 59 year old female presents emergency department with complaints of pinkeye. Physical exam shows  the right eye is red with some matting.  Explained to her that she has acute conjunctivitis.  She was given prescription for tobramycin ophthalmic drops.  A work note until she has had 24 hours of drops.  She is to follow-up with regular doctor if not better in 3 days.  Return emergency department worsening.  States she understands will comply.  She discharged in stable condition.     As part of my medical decision making, I reviewed the following data within the [place] notes reviewed and incorporated, Old chart reviewed, Notes from prior ED visits and [place] Controlled Substance Database  ____________________________________________   FINAL CLINICAL IMPRESSION(S) / ED DIAGNOSES  Final diagnoses:  Acute bacterial conjunctivitis of right eye      NEW MEDICATIONS STARTED DURING THIS VISIT:  Discharge Medication List as of 01/23/2019 11:26 AM    START taking these medications   Details  tobramycin (TOBREX) 0.3 % ophthalmic solution Place 2 drops into both eyes every 4 (four) hours., Starting Sun 01/23/2019, Normal         Note:  This document was prepared using Dragon voice recognition software and may include unintentional dictation errors.    Versie Starks,  PA-C 01/23/19 1206    Merlyn Lot, MD 01/23/19 [phone]

## 2019-01-23 NOTE — Discharge Instructions (Addendum)
Follow-up with your regular doctor if you are not improving in 3 to 4 days.  Return emergency department worsening.  Use medication as prescribed.  Wash your hands every time you touch her eyes.  A warm compress will help with the crusting.

## 2019-01-24 ENCOUNTER — Encounter: Payer: Self-pay | Admitting: Family Medicine

## 2019-01-25 ENCOUNTER — Encounter: Payer: Self-pay | Admitting: Family Medicine

## 2019-01-26 ENCOUNTER — Other Ambulatory Visit: Payer: Self-pay

## 2019-01-26 ENCOUNTER — Emergency Department
Admission: EM | Admit: 2019-01-26 | Discharge: 2019-01-27 | Disposition: A | Discharge status: Home / Self Care | Payer: BLUE CROSS/BLUE SHIELD | Attending: Emergency Medicine | Admitting: Emergency Medicine

## 2019-01-26 ENCOUNTER — Emergency Department: Payer: BLUE CROSS/BLUE SHIELD

## 2019-01-26 DIAGNOSIS — Z7984 Long term (current) use of oral hypoglycemic drugs: Secondary | ICD-10-CM | POA: Diagnosis not present

## 2019-01-26 DIAGNOSIS — K760 Fatty (change of) liver, not elsewhere classified: Secondary | ICD-10-CM | POA: Diagnosis not present

## 2019-01-26 DIAGNOSIS — R079 Chest pain, unspecified: Secondary | ICD-10-CM | POA: Diagnosis not present

## 2019-01-26 DIAGNOSIS — I10 Essential (primary) hypertension: Secondary | ICD-10-CM | POA: Insufficient documentation

## 2019-01-26 DIAGNOSIS — Z79899 Other long term (current) drug therapy: Secondary | ICD-10-CM | POA: Insufficient documentation

## 2019-01-26 DIAGNOSIS — F329 Major depressive disorder, single episode, unspecified: Secondary | ICD-10-CM | POA: Insufficient documentation

## 2019-01-26 DIAGNOSIS — R091 Pleurisy: Secondary | ICD-10-CM | POA: Diagnosis not present

## 2019-01-26 DIAGNOSIS — F419 Anxiety disorder, unspecified: Secondary | ICD-10-CM | POA: Insufficient documentation

## 2019-01-26 DIAGNOSIS — Z7982 Long term (current) use of aspirin: Secondary | ICD-10-CM | POA: Diagnosis not present

## 2019-01-26 DIAGNOSIS — E119 Type 2 diabetes mellitus without complications: Secondary | ICD-10-CM | POA: Diagnosis not present

## 2019-01-26 DIAGNOSIS — R1011 Right upper quadrant pain: Secondary | ICD-10-CM

## 2019-01-26 LAB — COMPREHENSIVE METABOLIC PANEL
ALT: 23 U/L (ref 0–44)
AST: 21 U/L (ref 15–41)
Albumin: 3.8 g/dL (ref 3.5–5.0)
Alkaline Phosphatase: 85 U/L (ref 38–126)
Anion gap: 6 (ref 5–15)
BUN: 20 mg/dL (ref 6–20)
CHLORIDE: 104 mmol/L (ref 98–111)
CO2: 31 mmol/L (ref 22–32)
CREATININE: 0.87 mg/dL (ref 0.44–1.00)
Calcium: 9.5 mg/dL (ref 8.9–10.3)
GFR calc Af Amer: >60 mL/min (ref >60)
GFR calc non Af Amer: >60 mL/min (ref >60)
Glucose, Bld: 175 mg/dL — ABNORMAL HIGH (ref 70–99)
Potassium: 3.9 mmol/L (ref 3.5–5.1)
Sodium: 141 mmol/L (ref 135–145)
Total Bilirubin: 0.4 mg/dL (ref 0.3–1.2)
Total Protein: 7.8 g/dL (ref 6.5–8.1)

## 2019-01-26 LAB — CBC
HCT: 41.6 % (ref 36.0–46.0)
Hemoglobin: 13 g/dL (ref 12.0–15.0)
MCH: 25.8 pg — ABNORMAL LOW (ref 26.0–34.0)
MCHC: 31.3 g/dL (ref 30.0–36.0)
MCV: 82.7 fL (ref 80.0–100.0)
Platelets: 290 10*3/uL (ref 150–400)
RBC: 5.03 MIL/uL (ref 3.87–5.11)
RDW: 14.1 % (ref 11.5–15.5)
WBC: 10.5 10*3/uL (ref 4.0–10.5)
nRBC: 0 % (ref 0.0–0.2)

## 2019-01-26 LAB — TROPONIN I: Troponin I: <0.03 ng/mL (ref <0.03)

## 2019-01-26 IMAGING — CR DG CHEST 2V
2 series · 2 of 2 positions shown · non-contrast
Comparison: [DATE] chest radiograph

CLINICAL DATA: 57 y/o  F; midsternal chest pain.

EXAM:
CHEST - 2 VIEW

[chest pa]
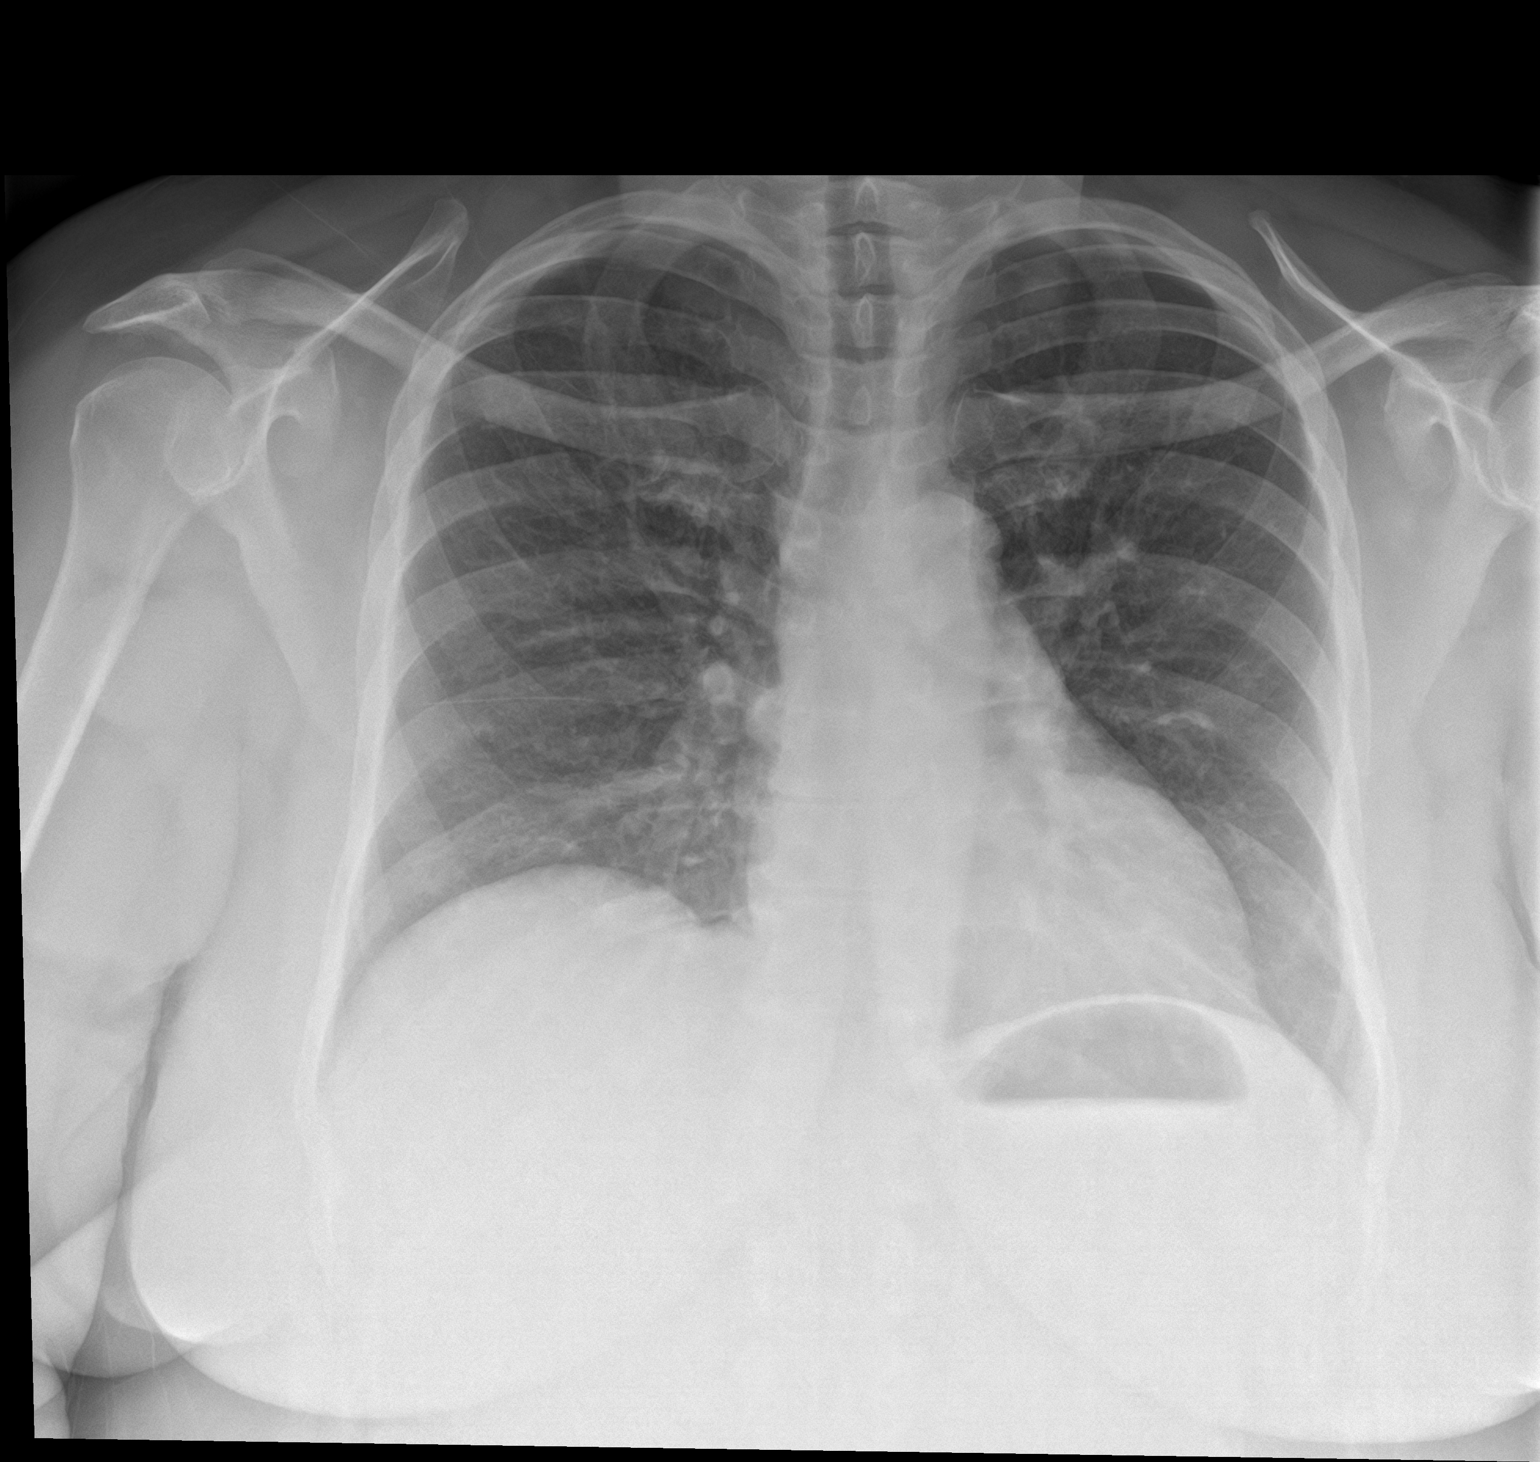

[chest lat]
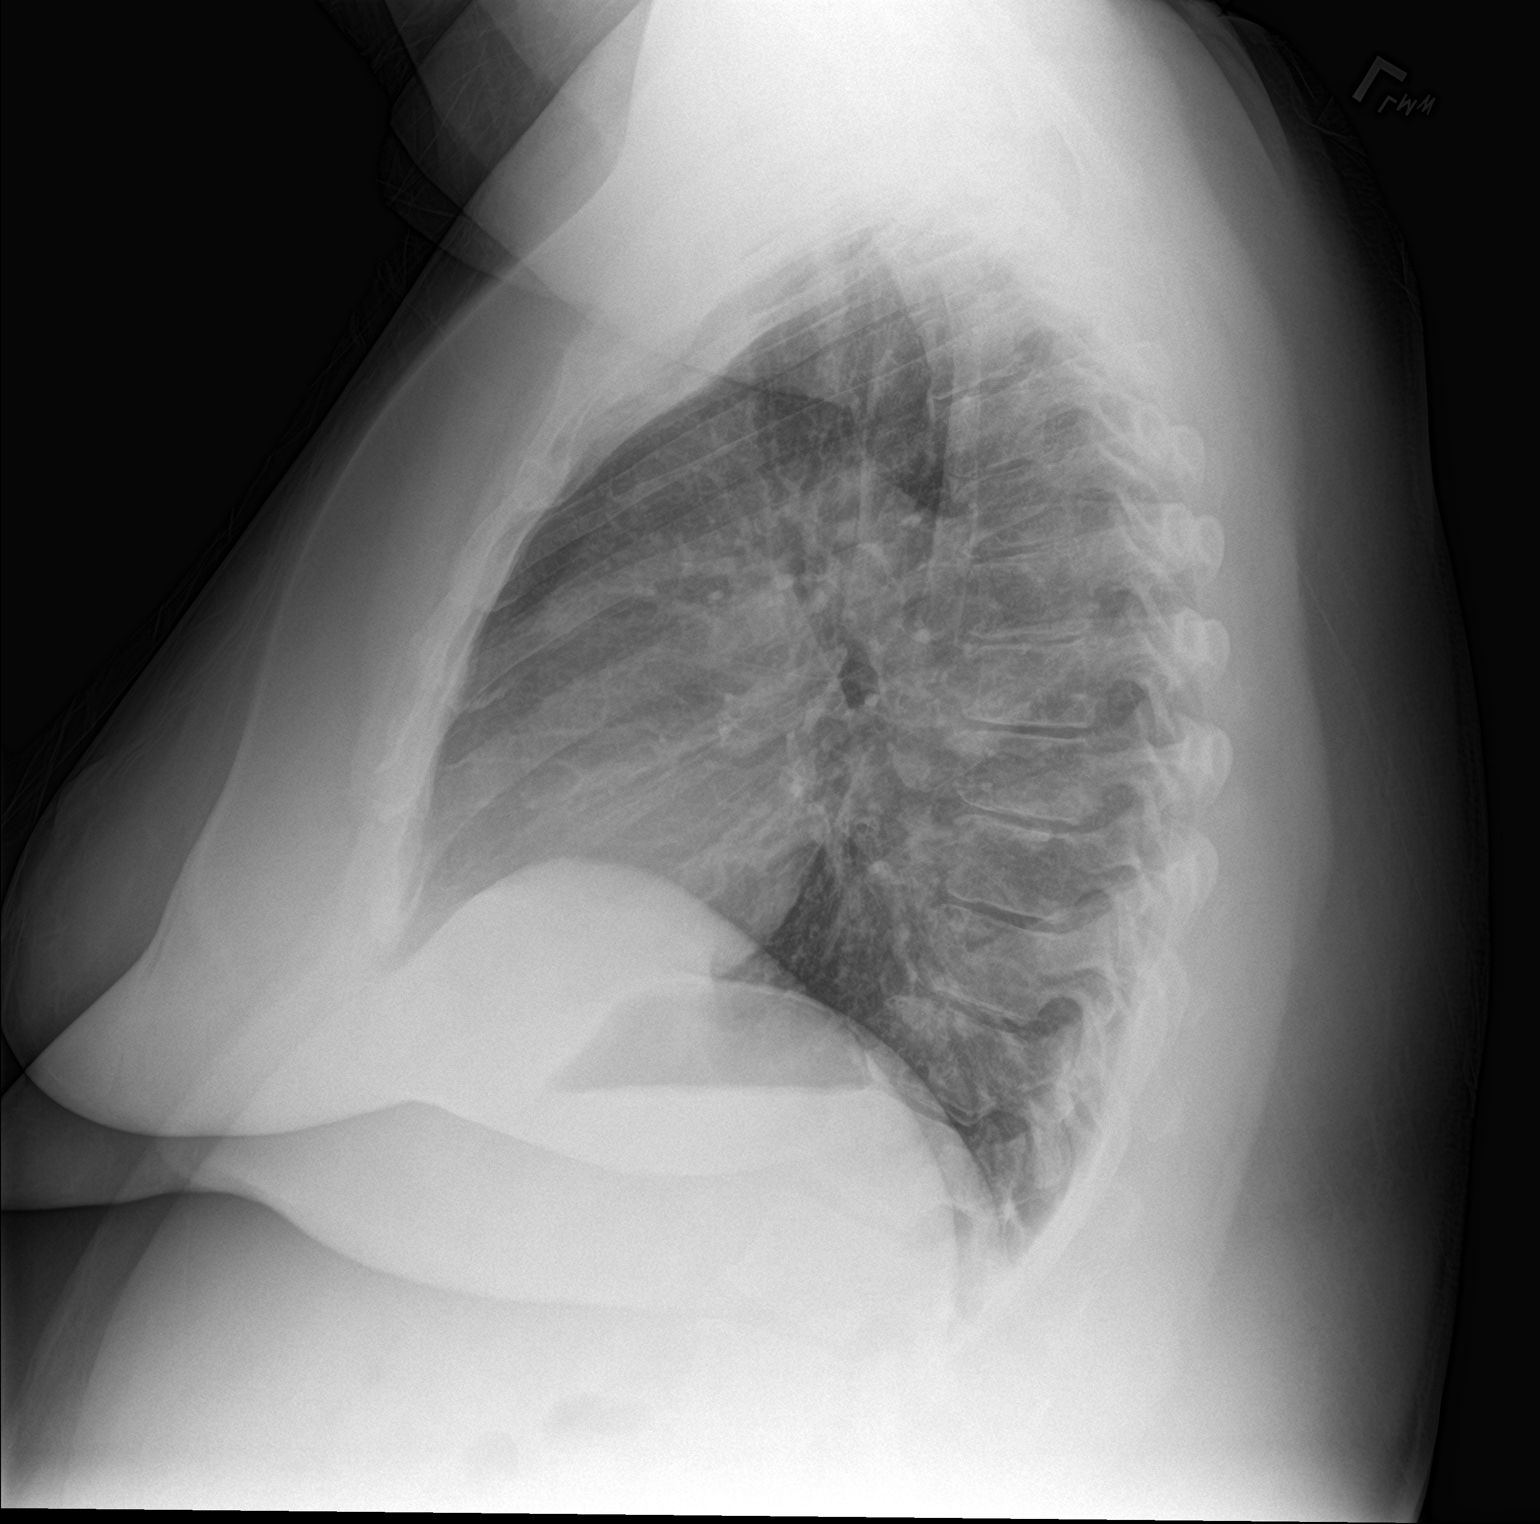

[2 of 2 positions shown; findings below may reference images not displayed]

FINDINGS: Stable heart size and mediastinal contours are within normal limits.
Both lungs are clear. The visualized skeletal structures are
unremarkable.
IMPRESSION: No acute pulmonary process identified.

## 2019-01-26 NOTE — ED Triage Notes (Addendum)
Pt in with co midsternal chest pain states hx of the same and all tests were normal. Pt has had cold symptoms with slight cough. Pt states she had been eating a lot of fried foods lately, and has had increased belching.

## 2019-01-26 NOTE — ED Provider Notes (Signed)
[facility] Emergency Department Provider Note  ____________________________________________   First MD Initiated Contact with Patient 01/26/19 2337     (approximate)  I have reviewed the triage vital signs and the nursing notes.   HISTORY  Chief Complaint Chest Pain   HPI Lindsay Mcdonald is a 58 y.o. female below list of chronic medical conditions presents to the emergency department with 2 episodes today of central nonradiating chest pain lasting approximately 5 minutes each episode.  Patient states that the pain was sharp.  Patient denies any dyspnea no nausea or vomiting.  Patient states first episode was upon awakening this morning and then this second episode occurred while eating.  Patient also admits to cough and nasal congestion recently.  Patient however denies any fever.  Patient denies any chest pain at present.  Patient denies any dyspnea.  Patient denies any history of DVT or PE.  Patient denies any history of CAD.  Patient denies any lower extremity pain or swelling at present.   Past Medical History:  Diagnosis Date  . Anxiety   . Chronic pain of right knee   . Depression   . Diabetes mellitus   . High cholesterol   . Hypertension   . Muscle pain   . Palpitations   . Vitamin D deficiency   . Vitamin D deficiency     Patient Active Problem List   Diagnosis Date Noted  . Fibromyalgia 12/31/2018  . Venous vascular malformations 03/27/2016  . Diabetic peripheral neuropathy associated with type 2 diabetes mellitus ([place]) 09/26/2015  . Non compliance w medication regimen 09/26/2015  . Seasonal allergic rhinitis 09/26/2015  . Muscle spasms of neck 06/19/2015  . Insomnia 06/19/2015  . Primary osteoarthritis of right knee 06/17/2015  . Depression, major, recurrent, mild ([place]) 06/17/2015  . Diabetes mellitus type 2, uncontrolled ([place]) 06/17/2015  . Dyslipidemia 06/17/2015  . Benign hypertension 06/17/2015  . Morbid obesity with BMI of  40.0-44.9, adult ([place]) 06/17/2015  . Vitamin D deficiency 06/17/2015    Past Surgical History:  Procedure Laterality Date  . BREAST REDUCTION SURGERY    . nasal endoscopic    . OOPHORECTOMY  ?   one ovary removed   . REDUCTION MAMMAPLASTY Bilateral 2000  . removal of ovary      Prior to Admission medications   Medication Sig Start Date End Date Taking? Authorizing Provider  ALPRAZolam (ALPRAZOLAM XR) 0.5 MG 24 hr tablet Take 1 tablet (0.5 mg total) by mouth daily. 12/31/18   Steele Sizer, MD  aspirin 81 MG tablet Take 81 mg by mouth daily.    [provider]  cholecalciferol (VITAMIN D) 1000 UNITS tablet Take 1,000 Units by mouth 2 (two) times daily.      [provider]  diphenhydrAMINE-APAP 25-500 & 500 MG MISC Take 1 tablet by mouth every evening. 09/28/18   Steele Sizer, MD  glucose blood test strip Use as instructed 04/01/17   Steele Sizer, MD  Insulin Pen Needle 32G X 6 MM MISC 1 each by Does not apply route daily. 04/01/17   Steele Sizer, MD  lisinopril-hydrochlorothiazide (PRINZIDE,ZESTORETIC) 10-12.5 MG tablet Take 1 tablet by mouth daily. 09/28/18   Steele Sizer, MD  magnesium 30 MG tablet Take 30 mg by mouth.    [provider]  metFORMIN (GLUCOPHAGE-XR) 750 MG 24 hr tablet Take 2 tablets (1,500 mg total) by mouth daily with breakfast. 09/28/18   Ancil Boozer, Drue Stager, MD  montelukast (SINGULAIR) 10 MG tablet Take 1 tablet by  mouth daily. 12/21/17   Carloyn Manner, MD  Multiple Vitamin (MULITIVITAMIN WITH MINERALS) TABS Take 1 tablet by mouth daily.      [provider]  [facility] DELICA LANCETS FINE MISC 1 Units by Does not apply route 2 (two) times daily. 04/01/17   Steele Sizer, MD  Pitavastatin Calcium (LIVALO) 2 MG TABS Take 1 tablet (2 mg total) by mouth daily. 09/28/18   Steele Sizer, MD  pregabalin (LYRICA) 75 MG capsule Take 1 capsule (75 mg total) by mouth 2 (two) times daily. 12/31/18   Steele Sizer, MD  tobramycin  (TOBREX) 0.3 % ophthalmic solution Place 2 drops into both eyes every 4 (four) hours. 01/23/19   Versie Starks, PA-C  XULTOPHY 100-3.6 UNIT-MG/ML SOPN INJECT 50 UNITS SUBCUTANEOUSLY DAILY 01/16/19   Steele Sizer, MD    Allergies Atorvastatin; Baclofen; Compazine; and Penicillins  Family History  Problem Relation Age of Onset  . Diabetes Mother   . Cancer Mother 24       Breast  . Breast cancer Mother 35  . Gout Mother   . Diabetes Daughter        Oldest Daughter  . Stroke Maternal Uncle   . Breast cancer Maternal Aunt 25  . Diabetes Father   . Kidney cancer Maternal Aunt   . Kidney disease Maternal Aunt     Social History Social History   Tobacco Use  . Smoking status: Never Smoker  . Smokeless tobacco: Never Used  Substance Use Topics  . Alcohol use: No    Alcohol/week: 0.0 standard drinks  . Drug use: No    Review of Systems Constitutional: No fever/chills Eyes: No visual changes. ENT: No sore throat. Cardiovascular: Positive for chest pain. Respiratory: Denies shortness of breath. Gastrointestinal: No abdominal pain.  No nausea, no vomiting.  No diarrhea.  No constipation. Genitourinary: Negative for dysuria. Musculoskeletal: Negative for neck pain.  Negative for back pain. Integumentary: Negative for rash. Neurological: Negative for headaches, focal weakness or numbness.  ____________________________________________   PHYSICAL EXAM:  VITAL SIGNS: ED Triage Vitals [01/26/19 2010]  Enc Vitals Group     BP (!) 141/84     Pulse Rate 99     Resp 20     Temp 98.4 F (36.9 C)     Temp Source Oral     SpO2 100 %     Weight 124.7 kg (275 lb)     Height 1.651 m (5\' 5" )     Head Circumference      Peak Flow      Pain Score 0     Pain Loc      Pain Edu?      Excl. in [place]?     Constitutional: Alert and oriented. Well appearing and in no acute distress. Eyes: Conjunctivae are normal.  Mouth/Throat: Mucous membranes are moist.  Oropharynx  non-erythematous. Neck: No stridor.   Chest: Pain with parasternal palpation. Cardiovascular: Normal rate, regular rhythm. Good peripheral circulation. Grossly normal heart sounds. Respiratory: Normal respiratory effort.  No retractions. Lungs CTAB. Gastrointestinal: Soft and nontender. No distention.  Musculoskeletal: No lower extremity tenderness nor edema. No gross deformities of extremities. Neurologic:  Normal speech and language. No gross focal neurologic deficits are appreciated.  Skin:  Skin is warm, dry and intact. No rash noted. Psychiatric: Mood and affect are normal. Speech and behavior are normal.  ____________________________________________   LABS (all labs ordered are listed, but only abnormal results are displayed)  Labs Reviewed  CBC -  Abnormal; Notable for the following components:      Result Value   MCH 25.8 (*)    All other components within normal limits  COMPREHENSIVE METABOLIC PANEL - Abnormal; Notable for the following components:   Glucose, Bld 175 (*)    All other components within normal limits  TROPONIN I  TROPONIN I   ____________________________________________  EKG  ED ECG REPORT I, [place] N , the attending physician, personally viewed and interpreted this ECG.   Date: 01/26/2019  EKG Time: 8:08 PM  Rate: 97  Rhythm: Normal sinus rhythm  Axis: Normal  Intervals: Normal  ST&T Change: None  ____________________________________________  RADIOLOGY I, [place] N , personally viewed and evaluated these images (plain radiographs) as part of my medical decision making, as well as reviewing the written report by the radiologist.  ED MD interpretation: Chest x-ray revealed no acute abnormality per radiologist. Right upper quadrant ultrasound revealed no evidence of cholelithiasis or cholecystitis.  Official radiology report(s): Dg Chest 2 View  Result Date: 01/26/2019 CLINICAL DATA:  58 y/o  F; midsternal chest pain. EXAM: CHEST -  2 VIEW COMPARISON:  08/01/2014 chest radiograph FINDINGS: Stable heart size and mediastinal contours are within normal limits. Both lungs are clear. The visualized skeletal structures are unremarkable. IMPRESSION: No acute pulmonary process identified. Electronically Signed   By: Kristine Garbe M.D.   On: 01/26/2019 20:37   US Abdomen Limited Ruq  Result Date: 01/27/2019 CLINICAL DATA:  Right upper quadrant pain for 1 day. EXAM: ULTRASOUND ABDOMEN LIMITED RIGHT UPPER QUADRANT COMPARISON:  07/09/2011 FINDINGS: Gallbladder: Gallbladder is contracted, likely physiologic. Gallbladder wall thickness measures 4.3 mm, likely due to incomplete distention. No stones, no sludge, negative Murphy's sign. Common bile duct: Diameter: 3.1 mm, normal Liver: Hepatic parenchymal echotexture is mildly increased diffusely suggesting mild fatty infiltration. No focal lesions identified. Portal vein is patent on color Doppler imaging with normal direction of blood flow towards the liver. IMPRESSION: Contracted gallbladder. No cholelithiasis or cholecystitis. Mild diffuse fatty infiltration of the liver. Electronically Signed   By: Lucienne Capers M.D.   On: 01/27/2019 03:32    __________________   Procedures   ____________________________________________   INITIAL IMPRESSION / ASSESSMENT AND PLAN / ED COURSE  As part of my medical decision making, I reviewed the following data within the electronic MEDICAL RECORD NUMBER   58 year old female presenting with above-stated history and physical exam secondary chest pain considered possibly of CAD pleurisy PE gallbladder disease given right upper quadrant discomfort.  Laboratory data notable for troponin negative x2 EKG reveals no evidence of ischemia or infarction.  Right upper quadrant ultrasound revealed no cholelithiasis or cholecystitis.  Patient remained pain-free during entire ED stay.  Advised the patient to follow-up with cardiology if pain does  recur. ____________________________________________  FINAL CLINICAL IMPRESSION(S) / ED DIAGNOSES  Final diagnoses:  RUQ pain  Nonspecific chest pain  Pleurisy     MEDICATIONS GIVEN DURING THIS VISIT:  Medications - No data to display   ED Discharge Orders    None       Note:  This document was prepared using Dragon voice recognition software and may include unintentional dictation errors.   Gregor Hams, MD 01/27/19 [phone]

## 2019-01-27 ENCOUNTER — Encounter: Payer: Self-pay | Admitting: Family Medicine

## 2019-01-27 ENCOUNTER — Emergency Department: Payer: BLUE CROSS/BLUE SHIELD

## 2019-01-27 DIAGNOSIS — K760 Fatty (change of) liver, not elsewhere classified: Secondary | ICD-10-CM | POA: Diagnosis not present

## 2019-01-27 LAB — TROPONIN I: Troponin I: <0.03 ng/mL (ref <0.03)

## 2019-02-02 ENCOUNTER — Ambulatory Visit: Payer: Self-pay

## 2019-02-02 NOTE — Telephone Encounter (Signed)
Pt called c/o 2 issues. 1. Pt c/o copious yellow to dark green nasal draiange since 01/19/19.  Pt denies sinus pain or fever, sore throat, cough, earache or difficulty breathing. Pt has been taking Sudafed and cold and flu medication. Pt has h/o several sinus surgeries. Advised pt to make appointment within 3 days but she stated she does not get pain until 02/11/19. Offered to make pt an appointment earlier but pt refused. Care advice given and advised pt to but Flonase as per her ENT recommendation.  2. Pt is taking 2 doses of 75 mg each of Lyrica. Pt c/o 1 week of intermittent severe bilateral arms. Pt describes pain during call as a 1-2 out of 10 on the pain scale.  Pt stated she is also having neck stiffness and pain. Pt stated that she has been sleeping on her daughters couch and thinks that could be a contributing factor. Care advice given and pt verbalized understanding. Pt asked for appt 02/11/19, the day she will get paid. No openings with PCP that day. Appointment given for pt 02/11/19 at 8:00 am with Raelyn Ensign FNP.   Reason for Disposition . [1] Nasal discharge AND [2] present > 10 days . [1] MILD pain (e.g., does not interfere with normal activities) AND [2] present > 7 days  Answer Assessment - Initial Assessment Questions 1. LOCATION: "Where does it hurt?"      No pain 2. ONSET: "When did the sinus pain start?"  (e.g., hours, days)      No  3. SEVERITY: "How bad is the pain?"   (Scale 1-10; mild, moderate or severe)   - MILD (1-3): doesn't interfere with normal activities    - MODERATE (4-7): interferes with normal activities (e.g., work or school) or awakens from sleep   - SEVERE (8-10): excruciating pain and patient unable to do any normal activities        n/a 4. RECURRENT SYMPTOM: "Have you ever had sinus problems before?" If so, ask: "When was the last time?" and "What happened that time?"      Yes- several sinus surgeries- Zpack or another abx 5. NASAL CONGESTION: "Is the nose  blocked?" If so, ask, "Can you open it or must you breathe through the mouth?"     No gets stuffy intermittently 6. NASAL DISCHARGE: "Do you have discharge from your nose?" If so ask, "What color?"     Yellow green to dark green copious and "clumpy" little spots of green 7. FEVER: "Do you have a fever?" If so, ask: "What is it, how was it measured, and when did it start?"      no 8. OTHER SYMPTOMS: "Do you have any other symptoms?" (e.g., sore throat, cough, earache, difficulty breathing)     no 9. PREGNANCY: "Is there any chance you are pregnant?" "When was your last menstrual period?"     n/a  Answer Assessment - Initial Assessment Questions 1. ONSET: "When did the pain start?"     1 week ago  2. LOCATION: "Where is the pain located?"    Both arms neck and shoulder 3. PAIN: "How bad is the pain?" (Scale 1-10; or mild, moderate, severe)   - MILD (1-3): doesn't interfere with normal activities   - MODERATE (4-7): interferes with normal activities (e.g., work or school) or awakens from sleep   - SEVERE (8-10): excruciating pain, unable to do any normal activities, unable to hold a cup of water     Achyness, pain in arms  can be "unbearable" intermittent now 1-2  4. WORK OR EXERCISE: "Has there been any recent work or exercise that involved this part of the body?"     No- sleeping on daughters cough 5. CAUSE: "What do you think is causing the arm pain?"     fibromyalgia 6. OTHER SYMPTOMS: "Do you have any other symptoms?" (e.g., neck pain, swelling, rash, fever, numbness, weakness)     Neck pain occasionally with stiffness 7. PREGNANCY: "Is there any chance you are pregnant?" "When was your last menstrual period?"     n/a  Protocols used: SINUS PAIN OR CONGESTION-A-AH, ARM PAIN-A-AH

## 2019-02-10 ENCOUNTER — Other Ambulatory Visit: Payer: Self-pay | Admitting: Family Medicine

## 2019-02-10 DIAGNOSIS — E1142 Type 2 diabetes mellitus with diabetic polyneuropathy: Secondary | ICD-10-CM

## 2019-02-11 ENCOUNTER — Ambulatory Visit: Payer: Self-pay | Admitting: Family Medicine

## 2019-02-11 ENCOUNTER — Other Ambulatory Visit: Payer: Self-pay | Admitting: Family Medicine

## 2019-02-11 DIAGNOSIS — E1142 Type 2 diabetes mellitus with diabetic polyneuropathy: Secondary | ICD-10-CM

## 2019-02-11 NOTE — Telephone Encounter (Signed)
Refill request for diabetic medication:   Xultophy 100-3.6 unit  Last office visit pertaining to diabetes: 12/31/2018  Lab Results  Component Value Date   HGBA1C 7.0 (A) 12/31/2018   HGBA1C 7.0 12/31/2018   HGBA1C 7.0 (A) 12/31/2018   HGBA1C 7.0 12/31/2018   Follow-ups on file. 03/30/2019

## 2019-02-14 ENCOUNTER — Telehealth: Payer: Self-pay | Admitting: Family Medicine

## 2019-02-14 NOTE — Telephone Encounter (Addendum)
Copied from [place] [phone]. Topic: General - Other >> Feb 14, 2019  9:39 AM Lennox Solders wrote: Reason for CRM: pt is calling and sometimes she feels shaky and when she eats she is ok this happen at work last week. . Pt be at work in the mornings  and does not check her BS when she feels shaky. Pt is on metformin and xultophy insulin. Pt would like to know if she can cut back on some of her dm medications. Pt has an appt with dr Ancil Boozer on 03-30-2019

## 2019-02-14 NOTE — Telephone Encounter (Signed)
We cannot adjust medication without having glucose levels, I need one week of glucose logs. Fasting and 2 hours after meals, also when she gets shaky

## 2019-02-17 ENCOUNTER — Telehealth: Payer: Self-pay | Admitting: Obstetrics and Gynecology

## 2019-02-17 ENCOUNTER — Encounter: Payer: Self-pay | Admitting: *Deleted

## 2019-02-17 NOTE — Telephone Encounter (Signed)
Sent pt mychart message

## 2019-02-17 NOTE — Telephone Encounter (Signed)
The patient is asking if Lindsay Mcdonald has privileges at [facility], as her daughter, Lindsay Mcdonald, is currently pregnant, and her current OB is out of network and she wants her daughter to see Lindsay Mcdonald if at all possible. She states her daughter is in [place].  Please advise, thanks.

## 2019-03-10 ENCOUNTER — Encounter: Payer: Self-pay | Admitting: Family Medicine

## 2019-03-13 ENCOUNTER — Emergency Department
Admission: EM | Admit: 2019-03-13 | Discharge: 2019-03-14 | Disposition: A | Discharge status: Home / Self Care | Payer: BLUE CROSS/BLUE SHIELD | Attending: Emergency Medicine | Admitting: Emergency Medicine

## 2019-03-13 ENCOUNTER — Encounter: Payer: Self-pay | Admitting: *Deleted

## 2019-03-13 ENCOUNTER — Emergency Department: Payer: BLUE CROSS/BLUE SHIELD

## 2019-03-13 ENCOUNTER — Other Ambulatory Visit: Payer: Self-pay

## 2019-03-13 DIAGNOSIS — F1210 Cannabis abuse, uncomplicated: Secondary | ICD-10-CM | POA: Insufficient documentation

## 2019-03-13 DIAGNOSIS — I10 Essential (primary) hypertension: Secondary | ICD-10-CM | POA: Insufficient documentation

## 2019-03-13 DIAGNOSIS — E119 Type 2 diabetes mellitus without complications: Secondary | ICD-10-CM | POA: Insufficient documentation

## 2019-03-13 DIAGNOSIS — F1290 Cannabis use, unspecified, uncomplicated: Secondary | ICD-10-CM | POA: Diagnosis not present

## 2019-03-13 DIAGNOSIS — R069 Unspecified abnormalities of breathing: Secondary | ICD-10-CM | POA: Diagnosis not present

## 2019-03-13 DIAGNOSIS — Z79899 Other long term (current) drug therapy: Secondary | ICD-10-CM | POA: Insufficient documentation

## 2019-03-13 DIAGNOSIS — R42 Dizziness and giddiness: Secondary | ICD-10-CM | POA: Diagnosis not present

## 2019-03-13 DIAGNOSIS — R1110 Vomiting, unspecified: Secondary | ICD-10-CM | POA: Diagnosis not present

## 2019-03-13 DIAGNOSIS — R000 Tachycardia, unspecified: Secondary | ICD-10-CM | POA: Diagnosis not present

## 2019-03-13 DIAGNOSIS — R112 Nausea with vomiting, unspecified: Secondary | ICD-10-CM | POA: Diagnosis not present

## 2019-03-13 DIAGNOSIS — R4182 Altered mental status, unspecified: Secondary | ICD-10-CM

## 2019-03-13 DIAGNOSIS — R110 Nausea: Secondary | ICD-10-CM | POA: Diagnosis not present

## 2019-03-13 LAB — COMPREHENSIVE METABOLIC PANEL
ALT: 20 U/L (ref 0–44)
AST: 26 U/L (ref 15–41)
Albumin: 3.6 g/dL (ref 3.5–5.0)
Alkaline Phosphatase: 73 U/L (ref 38–126)
Anion gap: 10 (ref 5–15)
BUN: 23 mg/dL — ABNORMAL HIGH (ref 6–20)
CO2: 27 mmol/L (ref 22–32)
Calcium: 8.8 mg/dL — ABNORMAL LOW (ref 8.9–10.3)
Chloride: 105 mmol/L (ref 98–111)
Creatinine, Ser: 0.84 mg/dL (ref 0.44–1.00)
GFR calc Af Amer: >60 mL/min (ref >60)
Glucose, Bld: 201 mg/dL — ABNORMAL HIGH (ref 70–99)
POTASSIUM: 3 mmol/L — AB (ref 3.5–5.1)
Sodium: 142 mmol/L (ref 135–145)
Total Bilirubin: <0.1 mg/dL — ABNORMAL LOW (ref 0.3–1.2)
Total Protein: 7.1 g/dL (ref 6.5–8.1)

## 2019-03-13 LAB — URINALYSIS, COMPLETE (UACMP) WITH MICROSCOPIC
BILIRUBIN URINE: NEGATIVE
Bacteria, UA: NONE SEEN
Glucose, UA: NEGATIVE mg/dL
Hgb urine dipstick: NEGATIVE
Ketones, ur: NEGATIVE mg/dL
Nitrite: NEGATIVE
Protein, ur: NEGATIVE mg/dL
Specific Gravity, Urine: 1.025 (ref 1.005–1.030)
pH: 5 (ref 5.0–8.0)

## 2019-03-13 LAB — URINE DRUG SCREEN, QUALITATIVE (ARMC ONLY)
Amphetamines, Ur Screen: NOT DETECTED
BARBITURATES, UR SCREEN: NOT DETECTED
Benzodiazepine, Ur Scrn: POSITIVE — AB
COCAINE METABOLITE, UR ~~LOC~~: NOT DETECTED
Cannabinoid 50 Ng, Ur ~~LOC~~: POSITIVE — AB
MDMA (Ecstasy)Ur Screen: NOT DETECTED
Methadone Scn, Ur: NOT DETECTED
Opiate, Ur Screen: NOT DETECTED
Phencyclidine (PCP) Ur S: NOT DETECTED
Tricyclic, Ur Screen: NOT DETECTED

## 2019-03-13 LAB — CBC
HCT: 38.7 % (ref 36.0–46.0)
Hemoglobin: 12.2 g/dL (ref 12.0–15.0)
MCH: 25.7 pg — ABNORMAL LOW (ref 26.0–34.0)
MCHC: 31.5 g/dL (ref 30.0–36.0)
MCV: 81.6 fL (ref 80.0–100.0)
NRBC: 0 % (ref 0.0–0.2)
Platelets: 267 10*3/uL (ref 150–400)
RBC: 4.74 MIL/uL (ref 3.87–5.11)
RDW: 14.2 % (ref 11.5–15.5)
WBC: 12.5 10*3/uL — ABNORMAL HIGH (ref 4.0–10.5)

## 2019-03-13 LAB — GLUCOSE, CAPILLARY: Glucose-Capillary: 175 mg/dL — ABNORMAL HIGH (ref 70–99)

## 2019-03-13 LAB — LIPASE, BLOOD: Lipase: 43 U/L (ref 11–51)

## 2019-03-13 MED ORDER — DIAZEPAM 5 MG/ML IJ SOLN
5.0000 mg | Freq: Once | INTRAMUSCULAR | Status: AC
  Administered 2019-03-13: 5 mg via INTRAVENOUS
  Filled 2019-03-13: qty 2

## 2019-03-13 MED ORDER — SODIUM CHLORIDE 0.9% FLUSH
3.0000 mL | Freq: Once | INTRAVENOUS | Status: AC
  Administered 2019-03-13: 3 mL via INTRAVENOUS

## 2019-03-13 NOTE — ED Triage Notes (Addendum)
Patient arrived by EMS. First nurse received report. Patient states she vomited once or twice and feels dizzy. Patient is actively vomiting a large emesis in triage.

## 2019-03-13 NOTE — ED Notes (Signed)
Pt\'s daughter states pt has consumed drugs this pm, possibly THC.

## 2019-03-13 NOTE — ED Notes (Signed)
Nurse first note-pt arrived from home with c/o N/V/D that started 30 minutes before arrival; HR 132, BS 170, temp 98.3; pt brought to [place] via wheelchair; awake and alert;

## 2019-03-13 NOTE — ED Notes (Signed)
Pt taken to ct with RNx2. Pt vomiting large amounts of undigested food. Airway protected.

## 2019-03-13 NOTE — Discharge Instructions (Signed)
Please seek medical attention for any high fevers, chest pain, shortness of breath, change in behavior, persistent vomiting, bloody stool or any other new or concerning symptoms.  

## 2019-03-13 NOTE — ED Provider Notes (Addendum)
[facility] Emergency Department Provider Note   ____________________________________________   I have reviewed the triage vital signs and the nursing notes.   HISTORY  Chief Complaint Emesis and Dizziness   History limited by: Not Limited   HPI Lindsay Mcdonald is a 58 y.o. female who presents to the emergency department today because of concerns for feeling nauseous, and sick.  Patient states the symptoms started suddenly tonight.  She just finished eating.  She feels like her whole body is sick.  She describes nausea as well as dizziness.  She denies similar symptoms in the past.  She denies any chest pain with this although feels like her heart has been beating fast.  The patient states that otherwise it is been a normal day for her. She denies any history of vertigo.    Records reviewed. Per medical record review patient has a history of DM, HLD, HTN.   Past Medical History:  Diagnosis Date  . Anxiety   . Chronic pain of right knee   . Depression   . Diabetes mellitus   . High cholesterol   . Hypertension   . Muscle pain   . Palpitations   . Vitamin D deficiency   . Vitamin D deficiency     Patient Active Problem List   Diagnosis Date Noted  . Fibromyalgia 12/31/2018  . Venous vascular malformations 03/27/2016  . Diabetic peripheral neuropathy associated with type 2 diabetes mellitus ([place]) 09/26/2015  . Non compliance w medication regimen 09/26/2015  . Seasonal allergic rhinitis 09/26/2015  . Muscle spasms of neck 06/19/2015  . Insomnia 06/19/2015  . Primary osteoarthritis of right knee 06/17/2015  . Depression, major, recurrent, mild ([place]) 06/17/2015  . Diabetes mellitus type 2, uncontrolled ([place]) 06/17/2015  . Dyslipidemia 06/17/2015  . Benign hypertension 06/17/2015  . Morbid obesity with BMI of 40.0-44.9, adult ([place]) 06/17/2015  . Vitamin D deficiency 06/17/2015    Past Surgical History:  Procedure Laterality Date  . BREAST  REDUCTION SURGERY    . nasal endoscopic    . OOPHORECTOMY  ?   one ovary removed   . REDUCTION MAMMAPLASTY Bilateral 2000  . removal of ovary      Prior to Admission medications   Medication Sig Start Date End Date Taking? Authorizing Provider  ALPRAZolam (ALPRAZOLAM XR) 0.5 MG 24 hr tablet Take 1 tablet (0.5 mg total) by mouth daily. 12/31/18   Steele Sizer, MD  aspirin 81 MG tablet Take 81 mg by mouth daily.    [provider]  cholecalciferol (VITAMIN D) 1000 UNITS tablet Take 1,000 Units by mouth 2 (two) times daily.      [provider]  diphenhydrAMINE-APAP 25-500 & 500 MG MISC Take 1 tablet by mouth every evening. 09/28/18   Steele Sizer, MD  glucose blood test strip Use as instructed 04/01/17   Steele Sizer, MD  Insulin Pen Needle 32G X 6 MM MISC 1 each by Does not apply route daily. 04/01/17   Steele Sizer, MD  lisinopril-hydrochlorothiazide (PRINZIDE,ZESTORETIC) 10-12.5 MG tablet Take 1 tablet by mouth daily. 09/28/18   Steele Sizer, MD  magnesium 30 MG tablet Take 30 mg by mouth.    [provider]  metFORMIN (GLUCOPHAGE-XR) 750 MG 24 hr tablet Take 2 tablets (1,500 mg total) by mouth daily with breakfast. 09/28/18   Ancil Boozer, Drue Stager, MD  montelukast (SINGULAIR) 10 MG tablet Take 1 tablet by mouth daily. 12/21/17   Carloyn Manner, MD  Multiple Vitamin (MULITIVITAMIN WITH MINERALS) TABS Take  1 tablet by mouth daily.      [provider]  [facility] DELICA LANCETS FINE MISC 1 Units by Does not apply route 2 (two) times daily. 04/01/17   Steele Sizer, MD  Pitavastatin Calcium (LIVALO) 2 MG TABS Take 1 tablet (2 mg total) by mouth daily. 09/28/18   Steele Sizer, MD  pregabalin (LYRICA) 75 MG capsule Take 1 capsule (75 mg total) by mouth 2 (two) times daily. 12/31/18   Steele Sizer, MD  tobramycin (TOBREX) 0.3 % ophthalmic solution Place 2 drops into both eyes every 4 (four) hours. 01/23/19   Versie Starks, PA-C  XULTOPHY 100-3.6  UNIT-MG/ML SOPN INJECT 50 UNITS SUBCUTANEOUSLY ONCE DAILY 02/11/19   Steele Sizer, MD    Allergies Atorvastatin; Baclofen; Compazine; and Penicillins  Family History  Problem Relation Age of Onset  . Diabetes Mother   . Cancer Mother 65       Breast  . Breast cancer Mother 81  . Gout Mother   . Diabetes Daughter        Oldest Daughter  . Stroke Maternal Uncle   . Breast cancer Maternal Aunt 80  . Diabetes Father   . Kidney cancer Maternal Aunt   . Kidney disease Maternal Aunt     Social History Social History   Tobacco Use  . Smoking status: Never Smoker  . Smokeless tobacco: Never Used  Substance Use Topics  . Alcohol use: No    Alcohol/week: 0.0 standard drinks  . Drug use: No    Review of Systems Constitutional: No fever/chills Eyes: No visual changes. ENT: No sore throat. Cardiovascular: Denies chest pain. Respiratory: Denies shortness of breath. Gastrointestinal: No abdominal pain.  Positive for nausea.  Genitourinary: Negative for dysuria. Musculoskeletal: Negative for back pain. Skin: Negative for rash. Neurological: Positive for dizziness.  ____________________________________________   PHYSICAL EXAM:  VITAL SIGNS: ED Triage Vitals  Enc Vitals Group     BP 03/13/19 2144 (!) 149/113     Pulse Rate 03/13/19 2144 (!) 118     Resp 03/13/19 2144 18     Temp 03/13/19 2144 97.8 F (36.6 C)     Temp Source 03/13/19 2144 Oral     SpO2 03/13/19 2144 94 %     Weight 03/13/19 2142 280 lb (127 kg)     Height 03/13/19 2142 5\' 5"  (1.651 m)     Head Circumference --      Peak Flow --      Pain Score 03/13/19 2141 0   Constitutional: Alert and oriented.  Eyes: Conjunctivae are normal.  ENT      Head: Normocephalic and atraumatic.      Nose: No congestion/rhinnorhea.      Mouth/Throat: Mucous membranes are moist.      Neck: No stridor. Hematological/Lymphatic/Immunilogical: No cervical lymphadenopathy. Cardiovascular: Normal rate, regular rhythm.  No  murmurs, rubs, or gallops.  Respiratory: Normal respiratory effort without tachypnea nor retractions. Breath sounds are clear and equal bilaterally. No wheezes/rales/rhonchi. Gastrointestinal: Soft and non tender. No rebound. No guarding.  Genitourinary: Deferred Musculoskeletal: Normal range of motion in all extremities. No lower extremity edema. Neurologic:  Positive for some upward nystagmus. Normal speech and language.  Skin:  Skin is warm, dry and intact. No rash noted. Psychiatric: Mood and affect are normal. Speech and behavior are normal. Patient exhibits appropriate insight and judgment.  ____________________________________________    LABS (pertinent positives/negatives)  CBC wbc 12.5, hgb 12.2, plt 267 CMP na 142, k 3.0, glu 201, cr  0.84 UA clear, trace leukocytes, 0-5 rbc and wbc  ____________________________________________   EKG  I, Nance Pear, attending physician, personally viewed and interpreted this EKG  EKG Time: 2151 Rate: 116 Rhythm: sinus tachycardia Axis: normal Intervals: qtc 469 QRS: LVH ST changes: no st elevation Impression: abnormal ekg  ____________________________________________    RADIOLOGY  CT head No acute abnormality  ____________________________________________   PROCEDURES  Procedures  ____________________________________________   INITIAL IMPRESSION / ASSESSMENT AND PLAN / ED COURSE  Pertinent labs & imaging results that were available during my care of the patient were reviewed by me and considered in my medical decision making (see chart for details).   Patient presented to the emergency department today feeling sick all over with some nausea and vomiting.  Shortly after my exam I was alerted by nursing staff that she was becoming less responsive.  I initially had some concerns for central lesion causing the dizziness and nausea so an MRI have been ordered.  Additionally did give valium to help for vertiginous  symptoms. However given patient's decreased responsiveness on repeat exam had concern for possible bleed given rapidity of onset.  Head CT however did not show any bleed or concerning findings.  After head CT was performed patient's daughter stated that the patient did take a brownie laced with marijuana tonight.  Patient does not frequently use marijuana.  At this point patient is still awake and alert.  I do think marijuana toxicity likely.  Blood work shows a mild hypokalemia but no other concerning findings.  Will plan on observation.  ____________________________________________   FINAL CLINICAL IMPRESSION(S) / ED DIAGNOSES  Final diagnoses:  Dizziness  Altered mental status, unspecified altered mental status type  Marijuana use     Note: This dictation was prepared with Dragon dictation. Any transcriptional errors that result from this process are unintentional     Nance Pear, MD 03/13/19 [id number]    Nance Pear, MD 03/13/19 [phone]

## 2019-03-13 NOTE — ED Notes (Addendum)
Pt with sudden change in neuro status. Pt not speaking with staff, and no longer answering questions or following commands. md to bedside. Valium was just administered. Pt with change in speaking to family at bedside before valium administered per family at bedside. Pt actively vomiting large amounts of undigested food.

## 2019-03-13 NOTE — ED Notes (Signed)
Head of bed elevated to 80 degrees. resps unlabored.

## 2019-03-14 NOTE — ED Notes (Signed)
Pt taken to lobby via wheelchair by this RN. Pt D/C into the care of her daughter who is her sober ride. Pt denies comments/concerns regarding D/C instructions.

## 2019-03-14 NOTE — ED Notes (Signed)
Pt resting, daughter updated on care plan.

## 2019-03-14 NOTE — ED Provider Notes (Addendum)
-----------------------------------------   12:15 AM on 03/14/2019 -----------------------------------------  Assumed care from Dr. Archie Balboa.  Will reassess patient when she is awake, sober and ambulatory with steady gait.   ----------------------------------------- 6:31 AM on 03/14/2019 -----------------------------------------  Patient is feeling significantly better although still "a little drowsy".  Her daughter who is with her is driving but she also appeared to be under the influence.  Both patient and her daughter are slept in the ED overnight.  Anticipate discharge home once she is more awake and ambulatory with steady gait.  Care will be transferred to the oncoming physician.    ----------------------------------------- 7:14 AM on 03/14/2019 -----------------------------------------  Patient awake and ready for discharge home. Strict return precautions given. Patient realizes understanding agrees with plan of care.     Paulette Blanch, MD 03/14/19 [phone]

## 2019-03-14 NOTE — ED Notes (Signed)
Pt awoken from sleep. States feels improved, "Still a little drowsy" per pt.

## 2019-03-14 NOTE — ED Notes (Signed)
Pt resting quietly in bed; no complaints or requests; side rails up with call bell in reach;

## 2019-03-14 NOTE — ED Notes (Signed)
Pt sleeping. 

## 2019-03-14 NOTE — ED Notes (Signed)
Report to megan, rn.  

## 2019-03-14 NOTE — ED Notes (Signed)
This RN  introduced self to the pt and asked pt to call for a sober ride.

## 2019-03-20 ENCOUNTER — Encounter: Payer: Self-pay | Admitting: Family Medicine

## 2019-03-21 ENCOUNTER — Other Ambulatory Visit: Payer: Self-pay | Admitting: Family Medicine

## 2019-03-23 ENCOUNTER — Encounter: Payer: Self-pay | Admitting: Family Medicine

## 2019-03-23 ENCOUNTER — Ambulatory Visit: Payer: BLUE CROSS/BLUE SHIELD | Admitting: Family Medicine

## 2019-03-29 ENCOUNTER — Telehealth: Payer: Self-pay | Admitting: *Deleted

## 2019-03-29 NOTE — Telephone Encounter (Signed)
Patient was returning call about her appointment- tried to transfer her into office- but patient declined- she states she is on lunch break and she will have to call back. Told patient appointments are virtual visits- will need to contact office unless instructed to do otherwise.

## 2019-03-30 ENCOUNTER — Encounter: Payer: Self-pay | Admitting: Family Medicine

## 2019-03-30 ENCOUNTER — Ambulatory Visit (INDEPENDENT_AMBULATORY_CARE_PROVIDER_SITE_OTHER): Payer: BLUE CROSS/BLUE SHIELD | Admitting: Family Medicine

## 2019-03-30 DIAGNOSIS — Z87898 Personal history of other specified conditions: Secondary | ICD-10-CM | POA: Diagnosis not present

## 2019-03-30 DIAGNOSIS — F411 Generalized anxiety disorder: Secondary | ICD-10-CM

## 2019-03-30 DIAGNOSIS — M797 Fibromyalgia: Secondary | ICD-10-CM

## 2019-03-30 DIAGNOSIS — F330 Major depressive disorder, recurrent, mild: Secondary | ICD-10-CM

## 2019-03-30 DIAGNOSIS — F1291 Cannabis use, unspecified, in remission: Secondary | ICD-10-CM | POA: Insufficient documentation

## 2019-03-30 DIAGNOSIS — E1142 Type 2 diabetes mellitus with diabetic polyneuropathy: Secondary | ICD-10-CM

## 2019-03-30 DIAGNOSIS — I10 Essential (primary) hypertension: Secondary | ICD-10-CM

## 2019-03-30 DIAGNOSIS — Z6841 Body Mass Index (BMI) 40.0 and over, adult: Secondary | ICD-10-CM

## 2019-03-30 DIAGNOSIS — F5101 Primary insomnia: Secondary | ICD-10-CM

## 2019-03-30 DIAGNOSIS — E785 Hyperlipidemia, unspecified: Secondary | ICD-10-CM

## 2019-03-30 MED ORDER — METFORMIN HCL ER 750 MG PO TB24: 1500.0000 mg | ORAL_TABLET | Freq: Every day | ORAL | 1 refills | Status: DC

## 2019-03-30 MED ORDER — ALPRAZOLAM ER 0.5 MG PO TB24: 0.5000 mg | ORAL_TABLET | Freq: Every day | ORAL | 2 refills | Status: DC

## 2019-03-30 MED ORDER — GLUCOSE BLOOD VI STRP: ORAL_STRIP | 12 refills | Status: DC

## 2019-03-30 MED ORDER — MELOXICAM 15 MG PO TABS: 15.0000 mg | ORAL_TABLET | Freq: Every day | ORAL | 0 refills | Status: DC

## 2019-03-30 MED ORDER — LISINOPRIL-HYDROCHLOROTHIAZIDE 10-12.5 MG PO TABS: 1.0000 | ORAL_TABLET | Freq: Every day | ORAL | 1 refills | Status: DC

## 2019-03-30 MED ORDER — PITAVASTATIN CALCIUM 2 MG PO TABS: 1.0000 | ORAL_TABLET | ORAL | 0 refills | Status: DC

## 2019-03-30 NOTE — Progress Notes (Signed)
Name: Lindsay Mcdonald   MRN: [id number]    DOB: 1960/12/25   Date:03/30/2019       Progress Note  Subjective  Chief Complaint  No chief complaint on file.   I connected with@ on 03/30/19 at 10:20 AM EDT by a video enabled telemedicine application and verified that I am speaking with the correct person using two identifiers.  I discussed the limitations of evaluation and management by telemedicine and the availability of in person appointments. The patient expressed understanding and agreed to proceed. Staff also discussed with the patient that there may be a patient responsible charge related to this service. Patient Location: home Provider Location: [facility]   HPI  FMS: she has a long history of feeling aching and tender all over, but worse lately . She moved in with her daughter and is now sleeping in an air mattress or sofa. Pain on shoulders, arms, stiff on her hips, no effusion of joints . Feels a little stiff when first gets up, she has some mental fogginess, she stopped Lyrica and was taking some otc medication and ibuprofen but is back on Lyrica only at night, advised to try taking 150 mg at night to see if helps, also discussed NSAID's risk  Fatty liver: found on US done at [facility] 03/22, discussed low fat diet and exercise, also to keep glucose under control.   EC visit: reviewed records with patient, she states had a marijuana brownie that evening and developed dizziness, vomiting and nausea, Daughter was with her, went home after electrolytes back to normal, normal CT US showed fatty liver . Discussed from now on we will check her urine for drugs and if positive again for any other illegal drugs or non rx medication we will stop giving her Alprazolam   DMII: onXultophy 50 units daily,also Metformin most days of the week, not checking glucose at home. HbgA1C down from 8.8%, to 7.9%,last visit was7.6% ,7% .She states no recent symptoms of neuropathy, but  occasionally has sharp pain on her feet,on ARB but last urine micro was normal. She has occasional hypoglycemic episodes at work, advised to check glucose when it happens.   HTN: taking lisinopril/HCTZ without any side effects, denies chest pain,SOB palpitationor dizziness.She has not been able to check bp at home   Hyperlipidemia;taking Livalo once a week, because it causes worsening of myalgias  Vascular Malformation: she states she has a group of vessels on right outer thigh, that she noticed her 30 's, not growing or painful, She had an exam done by vascular surgeon,decided not to do anything about it at this time. Unchanged   Major Depression with anxiety: she took Zoloft in the past, states Cymbalta caused "weird feeling ", she was afraid to start Lexapro because of side effects. She is also worried about depending on SSRI, but not bothered by Alprazolam XR. Explained that BZD's are controlled and she should try to start SSRI to be able to come off BZD. Shetried taking Prozac for one month Summer of 2019 however did not noticed improvement of symptoms of depression or anxiety and stopped it on her own Symptoms are stable, moved in with her daughter because of financial difficulties. She states COVID-19 has affected her emotionally, worried about losing her job   Morbid Obesity: weight is stable, she states her diet has not been great because of COVID-19   Patient Active Problem List   Diagnosis Date Noted  . Fibromyalgia 12/31/2018  . Venous vascular malformations 03/27/2016  .  Diabetic peripheral neuropathy associated with type 2 diabetes mellitus ([place]) 09/26/2015  . Non compliance w medication regimen 09/26/2015  . Seasonal allergic rhinitis 09/26/2015  . Muscle spasms of neck 06/19/2015  . Insomnia 06/19/2015  . Primary osteoarthritis of right knee 06/17/2015  . Depression, major, recurrent, mild ([place]) 06/17/2015  . Diabetes mellitus type 2, uncontrolled ([place]) 06/17/2015   . Dyslipidemia 06/17/2015  . Benign hypertension 06/17/2015  . Morbid obesity with BMI of 40.0-44.9, adult ([place]) 06/17/2015  . Vitamin D deficiency 06/17/2015    Past Surgical History:  Procedure Laterality Date  . BREAST REDUCTION SURGERY    . nasal endoscopic    . OOPHORECTOMY  ?   one ovary removed   . REDUCTION MAMMAPLASTY Bilateral 2000  . removal of ovary      Family History  Problem Relation Age of Onset  . Diabetes Mother   . Cancer Mother 58       Breast  . Breast cancer Mother 75  . Gout Mother   . Diabetes Daughter        Oldest Daughter  . Stroke Maternal Uncle   . Breast cancer Maternal Aunt 35  . Diabetes Father   . Kidney cancer Maternal Aunt   . Kidney disease Maternal Aunt     Social History   Socioeconomic History  . Marital status: Divorced    Spouse name: Not on file  . Number of children: 2  . Years of education: Not on file  . Highest education level: Some college, no degree  Occupational History  . Occupation: Designer, television/film set   Social Needs  . Financial resource strain: Somewhat hard  . Food insecurity:    Worry: Never true    Inability: Never true  . Transportation needs:    Medical: No    Non-medical: No  Tobacco Use  . Smoking status: Never Smoker  . Smokeless tobacco: Never Used  Substance and Sexual Activity  . Alcohol use: No    Alcohol/week: 0.0 standard drinks  . Drug use: No  . Sexual activity: Yes    Partners: Male    Birth control/protection: None  Lifestyle  . Physical activity:    Days per week: 3 days    Minutes per session: 20 min  . Stress: Very much  Relationships  . Social connections:    Talks on phone: More than three times a week    Gets together: Once a week    Attends religious service: More than 4 times per year    Active member of club or organization: No    Attends meetings of clubs or organizations: Never    Relationship status: Divorced  . Intimate partner violence:    Fear of current or ex  partner: No    Emotionally abused: No    Physically abused: No    Forced sexual activity: No  Other Topics Concern  . Not on file  Social History Narrative   Divorced and he died since   Working at [employer]/design[employer].      Current Outpatient Medications:  .  ALPRAZolam (ALPRAZOLAM XR) 0.5 MG 24 hr tablet, Take 1 tablet (0.5 mg total) by mouth daily., Disp: 30 tablet, Rfl: 2 .  aspirin 81 MG tablet, Take 81 mg by mouth daily., Disp: , Rfl:  .  cholecalciferol (VITAMIN D) 1000 UNITS tablet, Take 1,000 Units by mouth 2 (two) times daily.  , Disp: , Rfl:  .  glucose blood test strip, Use as instructed, Disp:  100 each, Rfl: 12 .  Insulin Pen Needle 32G X 6 MM MISC, 1 each by Does not apply route daily., Disp: 100 each, Rfl: 1 .  lisinopril-hydrochlorothiazide (PRINZIDE,ZESTORETIC) 10-12.5 MG tablet, Take 1 tablet by mouth daily., Disp: 90 tablet, Rfl: 1 .  metFORMIN (GLUCOPHAGE-XR) 750 MG 24 hr tablet, Take 2 tablets (1,500 mg total) by mouth daily with breakfast., Disp: 180 tablet, Rfl: 1 .  montelukast (SINGULAIR) 10 MG tablet, Take 1 tablet by mouth daily., Disp: , Rfl:  .  Multiple Vitamin (MULITIVITAMIN WITH MINERALS) TABS, Take 1 tablet by mouth daily.  , Disp: , Rfl:  .  ONETOUCH DELICA LANCETS FINE MISC, 1 Units by Does not apply route 2 (two) times daily., Disp: 100 each, Rfl: 2 .  Pitavastatin Calcium (LIVALO) 2 MG TABS, Take 1 tablet (2 mg total) by mouth daily., Disp: 90 tablet, Rfl: 1 .  pregabalin (LYRICA) 75 MG capsule, Take 1 capsule (75 mg total) by mouth 2 (two) times daily., Disp: 180 capsule, Rfl: 0 .  XULTOPHY 100-3.6 UNIT-MG/ML SOPN, INJECT 50 UNITS SUBCUTANEOUSLY ONCE DAILY, Disp: 15 mL, Rfl: 5 .  diphenhydrAMINE-APAP 25-500 & 500 MG MISC, Take 1 tablet by mouth every evening., Disp: 30 each, Rfl: 0 .  magnesium 30 MG tablet, Take 30 mg by mouth., Disp: , Rfl:  .  tobramycin (TOBREX) 0.3 % ophthalmic solution, Place 2 drops into both eyes every 4 (four) hours. (Patient  not taking: Reported on 03/30/2019), Disp: 5 mL, Rfl: 0  Allergies  Allergen Reactions  . Atorvastatin     and pravastatin caused muscle cramps, also simvastatin  . Baclofen     spasms  . Compazine Other (See Comments)    Twitching and spasms of neck muscles  . Penicillins     rash    I personally reviewed active problem list, medication list, allergies, family history, social history with the patient/caregiver today.   ROS  Constitutional: Negative for fever or weight change.  Respiratory: Negative for cough and shortness of breath.   Cardiovascular: Negative for chest pain or palpitations.  Gastrointestinal: Negative for abdominal pain, no bowel changes.  Musculoskeletal: Negative for gait problem or joint swelling.  Skin: Negative for rash.  Neurological: Negative for dizziness or headache.  No other specific complaints in a complete review of systems (except as listed in HPI above).  Objective  Virtual encounter, vitals not obtained.  There is no height or weight on file to calculate BMI.  Physical Exam  Awake, alert, well groomed  PHQ2/9: Depression screen [facility] 2/9 03/30/2019 12/31/2018 09/28/2018 08/11/2018 06/02/2018  Decreased Interest 0 1 1 1 2   Down, Depressed, Hopeless 1 1 0 0 2  PHQ - 2 Score 1 2 1 1 4   Altered sleeping 0 2 1 1 1   Tired, decreased energy 1 1 0 1 1  Change in appetite 0 1 1 1 2   Feeling bad or failure about yourself  1 1 - 0 1  Trouble concentrating 0 0 0 0 0  Moving slowly or fidgety/restless 0 0 0 0 0  Suicidal thoughts 0 0 0 0 0  PHQ-9 Score 3 7 3 4 9   Difficult doing work/chores Somewhat difficult Somewhat difficult Somewhat difficult Not difficult at all Somewhat difficult   PHQ-2/9 Result is positive.    GAD 7 : Generalized Anxiety Score 12/31/2018 09/28/2018 08/11/2018 01/04/2018  Nervous, Anxious, on Edge 1 - 1 0  Control/stop worrying 1 1 1 1   Worry too much - different  things 1 1 1 1   Trouble relaxing 1 1 1 1   Restless 1 0 0 0   Easily annoyed or irritable 0 1 1 0  Afraid - awful might happen 1 - 2 0  Total GAD 7 Score 6 - 7 3  Anxiety Difficulty - - Somewhat difficult Somewhat difficult     Fall Risk: Fall Risk  06/02/2018 03/18/2018 01/04/2018 12/02/2017 06/23/2017  Falls in the past year? No No No No No     Assessment & Plan  1. Diabetic peripheral neuropathy associated with type 2 diabetes mellitus (HCC)  - metFORMIN (GLUCOPHAGE-XR) 750 MG 24 hr tablet; Take 2 tablets (1,500 mg total) by mouth daily with breakfast.  Dispense: 180 tablet; Refill: 1 - glucose blood test strip; Use as instructed  Dispense: 100 each; Refill: 12  2. History of marijuana use  She states only uses prn, but will not do it anymore   3. GAD (generalized anxiety disorder)  - ALPRAZolam (ALPRAZOLAM XR) 0.5 MG 24 hr tablet; Take 1 tablet (0.5 mg total) by mouth daily.  Dispense: 30 tablet; Refill: 2  4. Morbid obesity with BMI of 40.0-44.9, adult [facility])  Discussed with the patient the risk posed by an increased BMI. Discussed importance of portion control, calorie counting and at least 150 minutes of physical activity weekly. Avoid sweet beverages and drink more water. Eat at least 6 servings of fruit and vegetables daily   5. Depression, major, recurrent, mild (HCC)  Cannot tolerate SSRI  6. Benign hypertension  - lisinopril-hydrochlorothiazide (PRINZIDE,ZESTORETIC) 10-12.5 MG tablet; Take 1 tablet by mouth daily.  Dispense: 90 tablet; Refill: 1  7. Fibromyalgia  Stable , she asked for meloxicam, explained to take it very seldom , avoid taking any other nsaid's.   8. Primary insomnia  Taking ibuprofen pm, advised tylenol pm  9. Dyslipidemia  - Pitavastatin Calcium (LIVALO) 2 MG TABS; Take 1 tablet (2 mg total) by mouth once a week.  Dispense: 12 tablet; Refill: 0 I discussed the assessment and treatment plan with the patient. The patient was provided an opportunity to ask questions and all were answered. The patient  agreed with the plan and demonstrated an understanding of the instructions.  The patient was advised to call back or seek an in-person evaluation if the symptoms worsen or if the condition fails to improve as anticipated.  I provided 25  minutes of non-face-to-face time during this encounter.

## 2019-04-08 ENCOUNTER — Telehealth: Payer: Self-pay | Admitting: Family Medicine

## 2019-04-08 NOTE — Telephone Encounter (Signed)
Copied from [place] [id number]. Topic: Quick Communication - See Telephone Encounter >> Apr 08, 2019  2:23 PM Blase Mess A wrote: CRM for notification. See Telephone encounter for: 04/08/19. Patient is calling to see if she has a co pay from the 4- /8/20 facetime visit. Please advise thank you CB- [phone]

## 2019-04-20 ENCOUNTER — Encounter: Payer: Self-pay | Admitting: Family Medicine

## 2019-04-23 ENCOUNTER — Encounter: Payer: Self-pay | Admitting: Family Medicine

## 2019-05-02 ENCOUNTER — Encounter: Payer: Self-pay | Admitting: Family Medicine

## 2019-06-01 ENCOUNTER — Other Ambulatory Visit: Payer: Self-pay | Admitting: Family Medicine

## 2019-06-01 DIAGNOSIS — R232 Flushing: Secondary | ICD-10-CM | POA: Diagnosis not present

## 2019-06-01 DIAGNOSIS — N898 Other specified noninflammatory disorders of vagina: Secondary | ICD-10-CM | POA: Diagnosis not present

## 2019-06-01 DIAGNOSIS — R6882 Decreased libido: Secondary | ICD-10-CM | POA: Diagnosis not present

## 2019-06-01 DIAGNOSIS — Z1231 Encounter for screening mammogram for malignant neoplasm of breast: Secondary | ICD-10-CM

## 2019-06-01 DIAGNOSIS — Z124 Encounter for screening for malignant neoplasm of cervix: Secondary | ICD-10-CM | POA: Diagnosis not present

## 2019-06-01 DIAGNOSIS — Z01419 Encounter for gynecological examination (general) (routine) without abnormal findings: Secondary | ICD-10-CM | POA: Diagnosis not present

## 2019-06-02 DIAGNOSIS — R6882 Decreased libido: Secondary | ICD-10-CM | POA: Diagnosis not present

## 2019-06-02 DIAGNOSIS — Z6841 Body Mass Index (BMI) 40.0 and over, adult: Secondary | ICD-10-CM | POA: Diagnosis not present

## 2019-06-02 DIAGNOSIS — R232 Flushing: Secondary | ICD-10-CM | POA: Diagnosis not present

## 2019-06-08 ENCOUNTER — Encounter: Payer: Self-pay | Admitting: Family Medicine

## 2019-06-12 ENCOUNTER — Other Ambulatory Visit: Payer: Self-pay | Admitting: Family Medicine

## 2019-06-12 DIAGNOSIS — M797 Fibromyalgia: Secondary | ICD-10-CM

## 2019-06-12 DIAGNOSIS — F411 Generalized anxiety disorder: Secondary | ICD-10-CM

## 2019-06-15 ENCOUNTER — Other Ambulatory Visit: Payer: Self-pay

## 2019-06-15 ENCOUNTER — Ambulatory Visit
Admission: RE | Admit: 2019-06-15 | Discharge: 2019-06-15 | Disposition: A | Discharge status: Home / Self Care | Payer: BC Managed Care – PPO | Source: Ambulatory Visit | Attending: Family Medicine | Admitting: Family Medicine

## 2019-06-15 DIAGNOSIS — R232 Flushing: Secondary | ICD-10-CM | POA: Diagnosis not present

## 2019-06-15 DIAGNOSIS — Z1231 Encounter for screening mammogram for malignant neoplasm of breast: Secondary | ICD-10-CM | POA: Insufficient documentation

## 2019-06-20 ENCOUNTER — Encounter: Payer: Self-pay | Admitting: Family Medicine

## 2019-06-20 ENCOUNTER — Other Ambulatory Visit: Payer: Self-pay

## 2019-06-20 ENCOUNTER — Ambulatory Visit: Payer: BLUE CROSS/BLUE SHIELD | Admitting: Family Medicine

## 2019-06-20 VITALS — BP 114/68 | HR 108 | Temp 97.1°F | Resp 18 | Ht 65.0 in | Wt 287.6 lb

## 2019-06-20 DIAGNOSIS — M778 Other enthesopathies, not elsewhere classified: Secondary | ICD-10-CM

## 2019-06-20 DIAGNOSIS — J302 Other seasonal allergic rhinitis: Secondary | ICD-10-CM

## 2019-06-20 DIAGNOSIS — E1142 Type 2 diabetes mellitus with diabetic polyneuropathy: Secondary | ICD-10-CM

## 2019-06-20 DIAGNOSIS — F411 Generalized anxiety disorder: Secondary | ICD-10-CM

## 2019-06-20 DIAGNOSIS — F330 Major depressive disorder, recurrent, mild: Secondary | ICD-10-CM | POA: Diagnosis not present

## 2019-06-20 DIAGNOSIS — E785 Hyperlipidemia, unspecified: Secondary | ICD-10-CM | POA: Diagnosis not present

## 2019-06-20 DIAGNOSIS — Z6841 Body Mass Index (BMI) 40.0 and over, adult: Secondary | ICD-10-CM

## 2019-06-20 DIAGNOSIS — D649 Anemia, unspecified: Secondary | ICD-10-CM | POA: Diagnosis not present

## 2019-06-20 DIAGNOSIS — I10 Essential (primary) hypertension: Secondary | ICD-10-CM

## 2019-06-20 DIAGNOSIS — M7581 Other shoulder lesions, right shoulder: Secondary | ICD-10-CM

## 2019-06-20 DIAGNOSIS — M797 Fibromyalgia: Secondary | ICD-10-CM

## 2019-06-20 DIAGNOSIS — J320 Chronic maxillary sinusitis: Secondary | ICD-10-CM

## 2019-06-20 LAB — POCT GLYCOSYLATED HEMOGLOBIN (HGB A1C): HbA1c, POC (controlled diabetic range): 6.6 % (ref 0.0–7.0)

## 2019-06-20 MED ORDER — LIVALO 2 MG PO TABS: 1.0000 | ORAL_TABLET | ORAL | 0 refills | Status: DC

## 2019-06-20 MED ORDER — DULOXETINE HCL 30 MG PO CPEP: 30.0000 mg | ORAL_CAPSULE | Freq: Every day | ORAL | 0 refills | Status: DC

## 2019-06-20 MED ORDER — LISINOPRIL-HYDROCHLOROTHIAZIDE 10-12.5 MG PO TABS: 0.5000 | ORAL_TABLET | Freq: Every day | ORAL | 0 refills | Status: DC

## 2019-06-20 MED ORDER — ALPRAZOLAM ER 0.5 MG PO TB24: 0.5000 mg | ORAL_TABLET | Freq: Every day | ORAL | 2 refills | Status: DC

## 2019-06-20 MED ORDER — DOXYCYCLINE HYCLATE 100 MG PO TABS: 100.0000 mg | ORAL_TABLET | Freq: Two times a day (BID) | ORAL | 0 refills | Status: DC

## 2019-06-20 MED ORDER — FARXIGA 10 MG PO TABS: 10.0000 mg | ORAL_TABLET | Freq: Every day | ORAL | 2 refills | Status: DC

## 2019-06-20 MED ORDER — RYBELSUS 7 MG PO TABS: 1.0000 | ORAL_TABLET | Freq: Every day | ORAL | 2 refills | Status: DC

## 2019-06-20 NOTE — Patient Instructions (Addendum)
Start Rybelsus when you are down to 30 units of [employer] now Titrate Xultophy down by 2 units every 3 days instead of stopping abruptly   Shoulder Exercises Ask your health care provider which exercises are safe for you. Do exercises exactly as told by your health care provider and adjust them as directed. It is normal to feel mild stretching, pulling, tightness, or discomfort as you do these exercises. Stop right away if you feel sudden pain or your pain gets worse. Do not begin these exercises until told by your health care provider. Stretching exercises External rotation and abduction This exercise is sometimes called corner stretch. This exercise rotates your arm outward (external rotation) and moves your arm out from your body (abduction). 1. Stand in a doorway with one of your feet slightly in front of the other. This is called a staggered stance. If you cannot reach your forearms to the door frame, stand facing a corner of a room. 2. Choose one of the following positions as told by your health care provider: ? Place your hands and forearms on the door frame above your head. ? Place your hands and forearms on the door frame at the height of your head. ? Place your hands on the door frame at the height of your elbows. 3. Slowly move your weight onto your front foot until you feel a stretch across your chest and in the front of your shoulders. Keep your head and chest upright and keep your abdominal muscles tight. 4. Hold for __________ seconds. 5. To release the stretch, shift your weight to your back foot. Repeat __________ times. Complete this exercise __________ times a day. Extension, standing 1. Stand and hold a broomstick, a cane, or a similar object behind your back. ? Your hands should be a little wider than shoulder width apart. ? Your palms should face away from your back. 2. Keeping your elbows straight and your shoulder muscles relaxed, move the stick away from  your body until you feel a stretch in your shoulders (extension). ? Avoid shrugging your shoulders while you move the stick. Keep your shoulder blades tucked down toward the middle of your back. 3. Hold for __________ seconds. 4. Slowly return to the starting position. Repeat __________ times. Complete this exercise __________ times a day. Range-of-motion exercises Pendulum  1. Stand near a wall or a surface that you can hold onto for balance. 2. Bend at the waist and let your left / right arm hang straight down. Use your other arm to support you. Keep your back straight and do not lock your knees. 3. Relax your left / right arm and shoulder muscles, and move your hips and your trunk so your left / right arm swings freely. Your arm should swing because of the motion of your body, not because you are using your arm or shoulder muscles. 4. Keep moving your hips and trunk so your arm swings in the following directions, as told by your health care provider: ? Side to side. ? Forward and backward. ? In clockwise and counterclockwise circles. 5. Continue each motion for __________ seconds, or for as long as told by your health care provider. 6. Slowly return to the starting position. Repeat __________ times. Complete this exercise __________ times a day. Shoulder flexion, standing  1. Stand and hold a broomstick, a cane, or a similar object. Place your hands a little more than shoulder width apart on the object. Your left / right hand should be  palm up, and your other hand should be palm down. 2. Keep your elbow straight and your shoulder muscles relaxed. Push the stick up with your healthy arm to raise your left / right arm in front of your body, and then over your head until you feel a stretch in your shoulder (flexion). ? Avoid shrugging your shoulder while you raise your arm. Keep your shoulder blade tucked down toward the middle of your back. 3. Hold for __________ seconds. 4. Slowly return to  the starting position. Repeat __________ times. Complete this exercise __________ times a day. Shoulder abduction, standing 1. Stand and hold a broomstick, a cane, or a similar object. Place your hands a little more than shoulder width apart on the object. Your left / right hand should be palm up, and your other hand should be palm down. 2. Keep your elbow straight and your shoulder muscles relaxed. Push the object across your body toward your left / right side. Raise your left / right arm to the side of your body (abduction) until you feel a stretch in your shoulder. ? Do not raise your arm above shoulder height unless your health care provider tells you to do that. ? If directed, raise your arm over your head. ? Avoid shrugging your shoulder while you raise your arm. Keep your shoulder blade tucked down toward the middle of your back. 3. Hold for __________ seconds. 4. Slowly return to the starting position. Repeat __________ times. Complete this exercise __________ times a day. Internal rotation  1. Place your left / right hand behind your back, palm up. 2. Use your other hand to dangle an exercise band, a towel, or a similar object over your shoulder. Grasp the band with your left / right hand so you are holding on to both ends. 3. Gently pull up on the band until you feel a stretch in the front of your left / right shoulder. The movement of your arm toward the center of your body is called internal rotation. ? Avoid shrugging your shoulder while you raise your arm. Keep your shoulder blade tucked down toward the middle of your back. 4. Hold for __________ seconds. 5. Release the stretch by letting go of the band and lowering your hands. Repeat __________ times. Complete this exercise __________ times a day. Strengthening exercises External rotation  1. Sit in a stable chair without armrests. 2. Secure an exercise band to a stable object at elbow height on your left / right  side. 3. Place a soft object, such as a folded towel or a small pillow, between your left / right upper arm and your body to move your elbow about 4 inches (10 cm) away from your side. 4. Hold the end of the exercise band so it is tight and there is no slack. 5. Keeping your elbow pressed against the soft object, slowly move your forearm out, away from your abdomen (external rotation). Keep your body steady so only your forearm moves. 6. Hold for __________ seconds. 7. Slowly return to the starting position. Repeat __________ times. Complete this exercise __________ times a day. Shoulder abduction  1. Sit in a stable chair without armrests, or stand up. 2. Hold a __________ weight in your left / right hand, or hold an exercise band with both hands. 3. Start with your arms straight down and your left / right palm facing in, toward your body. 4. Slowly lift your left / right hand out to your side (abduction). Do not  lift your hand above shoulder height unless your health care provider tells you that this is safe. ? Keep your arms straight. ? Avoid shrugging your shoulder while you do this movement. Keep your shoulder blade tucked down toward the middle of your back. 5. Hold for __________ seconds. 6. Slowly lower your arm, and return to the starting position. Repeat __________ times. Complete this exercise __________ times a day. Shoulder extension 1. Sit in a stable chair without armrests, or stand up. 2. Secure an exercise band to a stable object in front of you so it is at shoulder height. 3. Hold one end of the exercise band in each hand. Your palms should face each other. 4. Straighten your elbows and lift your hands up to shoulder height. 5. Step back, away from the secured end of the exercise band, until the band is tight and there is no slack. 6. Squeeze your shoulder blades together as you pull your hands down to the sides of your thighs (extension). Stop when your hands are straight  down by your sides. Do not let your hands go behind your body. 7. Hold for __________ seconds. 8. Slowly return to the starting position. Repeat __________ times. Complete this exercise __________ times a day. Shoulder row 1. Sit in a stable chair without armrests, or stand up. 2. Secure an exercise band to a stable object in front of you so it is at waist height. 3. Hold one end of the exercise band in each hand. Position your palms so that your thumbs are facing the ceiling (neutral position). 4. Bend each of your elbows to a 90-degree angle (right angle) and keep your upper arms at your sides. 5. Step back until the band is tight and there is no slack. 6. Slowly pull your elbows back behind you. 7. Hold for __________ seconds. 8. Slowly return to the starting position. Repeat __________ times. Complete this exercise __________ times a day. Shoulder press-ups  1. Sit in a stable chair that has armrests. Sit upright, with your feet flat on the floor. 2. Put your hands on the armrests so your elbows are bent and your fingers are pointing forward. Your hands should be about even with the sides of your body. 3. Push down on the armrests and use your arms to lift yourself off the chair. Straighten your elbows and lift yourself up as much as you comfortably can. ? Move your shoulder blades down, and avoid letting your shoulders move up toward your ears. ? Keep your feet on the ground. As you get stronger, your feet should support less of your body weight as you lift yourself up. 4. Hold for __________ seconds. 5. Slowly lower yourself back into the chair. Repeat __________ times. Complete this exercise __________ times a day. Wall push-ups  1. Stand so you are facing a stable wall. Your feet should be about one arm-length away from the wall. 2. Lean forward and place your palms on the wall at shoulder height. 3. Keep your feet flat on the floor as you bend your elbows and lean forward toward  the wall. 4. Hold for __________ seconds. 5. Straighten your elbows to push yourself back to the starting position. Repeat __________ times. Complete this exercise __________ times a day. This information is not intended to replace advice given to you by your health care provider. Make sure you discuss any questions you have with your health care provider. Document Released: 10/22/2005 Document Revised: 04/01/2019 Document Reviewed: 01/07/2019 Elsevier Patient  Education  2020 [employer]  

## 2019-06-20 NOTE — Progress Notes (Signed)
Name: Lindsay Mcdonald   MRN: [id number]    DOB: 09-04-1961   Date:06/20/2019       Progress Note  Subjective  Chief Complaint  Chief Complaint  Patient presents with  . Medication Refill  . Diabetes  . Fibromyalgia  . Hyperlipidemia  . Depression  . 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 She moved in with her daughter and is now sleeping in an air mattress or sofa. Pain on shoulders, arms, stiff on her hips, no effusion of joints . Feels a little stiff when first gets up, she has some mental fogginess, she is back on Lyrica and is taking twice a day, she states pain comes and goes.   DMII: onXultophy 50 units daily,also Metformin most days of the week,not checking glucose at home.HbgA1C down from 8.8%, to 7.9%,l7.6%,7%and today is 6.6%, she really wants to stop using injectables, discussed options and she will try staying on Metformin, Rybelsus and [place] .Discussed low carbohydrate diet again.    HTN: taking lisinopril/HCTZ without any side effects, denies chest pain,SOBpalpitationor dizziness.BP is excellent , we will decrease dose since adding Farxiga   History of recurrent sinusitis: most of the time when she goes to ENT she has sinus cavity cleaned, she has tried saline spray a couple of days ago, but has noticed halitosis, post-nasal drainage but no fever, headaches. Advised to resume nasal steroid She also has some right facial pain   Hyperlipidemia;taking Livalo once a week, because it causes worsening of myalgias  Vascular Malformation: she states she has a group of vessels on right outer thigh, that she noticed her 35 's, not growing or painful, She had an exam done by vascular surgeon,decided not to do anything about it at this time. Unchanged   Major Depression with anxiety: she took Zoloft in the past, states Cymbalta caused "weird feeling " but did not take long enough, she  was afraid to start Lexapro because of side effects. She is also worried about depending on SSRI, but not bothered by Alprazolam XR. Explained that BZD's are controlled and she should try to start SSRI to be able to come off BZD. Shetried taking Prozac for one month Summer of 2019 however did not noticed improvement of symptoms of depression or anxiety and stopped it on her Audie Clear is willing to try duloxetine again since it helps with FMS syndrome  Right shoulder pain: going on for months, worse at night, difficulty sleeping at night, different from FMS, pain with abduction and internal rotation of right shoulder.   Morbid Obesity:weight has gone up, seen another provider and had [place] , LH and TSH done and normal for her. Discussed lower carbohydrate diet.   Patient Active Problem List   Diagnosis Date Noted  . History of marijuana use 03/30/2019  . Fibromyalgia 12/31/2018  . Venous vascular malformations 03/27/2016  . Diabetic peripheral neuropathy associated with type 2 diabetes mellitus ([facility]) 09/26/2015  . Non compliance w medication regimen 09/26/2015  . Seasonal allergic rhinitis 09/26/2015  . Muscle spasms of neck 06/19/2015  . Insomnia 06/19/2015  . Primary osteoarthritis of right knee 06/17/2015  . Depression, major, recurrent, mild ([place]) 06/17/2015  . Diabetes mellitus type 2, uncontrolled ([place]) 06/17/2015  . Dyslipidemia 06/17/2015  . Benign hypertension 06/17/2015  . Morbid obesity with BMI of 40.0-44.9, adult ([place]) 06/17/2015  . Vitamin D deficiency 06/17/2015    Past Surgical History:  Procedure Laterality Date  . BREAST REDUCTION SURGERY    . nasal endoscopic    . OOPHORECTOMY  ?   one ovary removed   . REDUCTION MAMMAPLASTY Bilateral 2000  . removal of ovary      Family History  Problem Relation Age of Onset  . Diabetes Mother   . Cancer Mother 71       Breast  . Breast cancer Mother 80  . Gout Mother   . Diabetes Daughter        Oldest Daughter  .  Stroke Maternal Uncle   . Breast cancer Maternal Aunt 11  . Diabetes Father   . Kidney cancer Maternal Aunt   . Kidney disease Maternal Aunt     Social History   Socioeconomic History  . Marital status: Divorced    Spouse name: Not on file  . Number of children: 2  . Years of education: Not on file  . Highest education level: Some college, no degree  Occupational History  . Occupation: Designer, television/film set   Social Needs  . Financial resource strain: Somewhat hard  . Food insecurity    Worry: Never true    Inability: Never true  . Transportation needs    Medical: No    Non-medical: No  Tobacco Use  . Smoking status: Never Smoker  . Smokeless tobacco: Never Used  Substance and Sexual Activity  . Alcohol use: No    Alcohol/week: 0.0 standard drinks  . Drug use: No  . Sexual activity: Yes    Partners: Male    Birth control/protection: None  Lifestyle  . Physical activity    Days per week: 3 days    Minutes per session: 20 min  . Stress: Very much  Relationships  . Social connections    Talks on phone: More than three times a week    Gets together: Once a week    Attends religious service: More than 4 times per year    Active member of club or organization: No    Attends meetings of clubs or organizations: Never    Relationship status: Divorced  . Intimate partner violence    Fear of current or ex partner: No    Emotionally abused: No    Physically abused: No    Forced sexual activity: No  Other Topics Concern  . Not on file  Social History Narrative   Divorced and he died since   Working at [employer]/design[employer].      Current Outpatient Medications:  .  ALPRAZolam (ALPRAZOLAM XR) 0.5 MG 24 hr tablet, Take 1 tablet (0.5 mg total) by mouth daily., Disp: 30 tablet, Rfl: 2 .  aspirin 81 MG tablet, Take 81 mg by mouth daily., Disp: , Rfl:  .  cholecalciferol (VITAMIN D) 1000 UNITS tablet, Take 1,000 Units by mouth 2 (two) times daily.  , Disp: , Rfl:  .   diphenhydrAMINE-APAP 25-500 & 500 MG MISC, Take 1 tablet by mouth every evening., Disp: 30 each, Rfl: 0 .  glucose blood test strip, Use as instructed, Disp: 100 each, Rfl: 12 .  Insulin Pen Needle 32G X 6 MM MISC, 1 each by Does not apply route daily., Disp: 100 each, Rfl: 1 .  lisinopril-hydrochlorothiazide (PRINZIDE,ZESTORETIC) 10-12.5 MG tablet, Take 1 tablet by mouth daily., Disp: 90 tablet, Rfl: 1 .  magnesium 30 MG tablet, Take 30 mg by mouth., Disp: , Rfl:  .  Melatonin 10 MG TABS, Take by mouth., Disp: , Rfl:  .  meloxicam (MOBIC) 15 MG tablet, Take 1 tablet (15 mg total) by mouth daily., Disp: 30 tablet, Rfl: 0 .  metFORMIN (GLUCOPHAGE-XR) 750 MG 24 hr tablet, Take 2 tablets (1,500 mg total) by mouth daily with breakfast., Disp: 180 tablet, Rfl: 1 .  montelukast (SINGULAIR) 10 MG tablet, Take 1 tablet by mouth daily., Disp: , Rfl:  .  Multiple Vitamin (MULITIVITAMIN WITH MINERALS) TABS, Take 1 tablet by mouth daily.  , Disp: , Rfl:  .  ONETOUCH DELICA LANCETS FINE MISC, 1 Units by Does not apply route 2 (two) times daily., Disp: 100 each, Rfl: 2 .  Pitavastatin Calcium (LIVALO) 2 MG TABS, Take 1 tablet (2 mg total) by mouth once a week., Disp: 12 tablet, Rfl: 0 .  pregabalin (LYRICA) 75 MG capsule, Take 1 capsule by mouth twice daily, Disp: 180 capsule, Rfl: 0 .  dapagliflozin propanediol (FARXIGA) 10 MG TABS tablet, Take 10 mg by mouth daily., Disp: 30 tablet, Rfl: 2 .  DULoxetine (CYMBALTA) 30 MG capsule, Take 1-2 capsules (30-60 mg total) by mouth daily., Disp: 60 capsule, Rfl: 0 .  Semaglutide (RYBELSUS) 7 MG TABS, Take 1 tablet by mouth daily., Disp: 30 tablet, Rfl: 2  Allergies  Allergen Reactions  . Atorvastatin     and pravastatin caused muscle cramps, also simvastatin  . Baclofen     spasms  . Compazine Other (See Comments)    Twitching and spasms of neck muscles  . Penicillins     rash    I personally reviewed active problem list, medication list, allergies, family  history with the patient/caregiver today.   ROS  Constitutional: Negative for fever , positive for  weight change.  Respiratory: Negative for cough and shortness of breath.   Cardiovascular: Negative for chest pain or palpitations.  Gastrointestinal: Negative for abdominal pain, no bowel changes.  Musculoskeletal: Negative for gait problem or joint swelling.  Skin: Negative for rash.  Neurological: Negative for dizziness or headache.  No other specific complaints in a complete review of systems (except as listed in HPI above).  Objective  Vitals:   06/20/19 1554  BP: 114/68  Pulse: (!) 108  Resp: 18  Temp: (!) 97.1 F (36.2 C)  TempSrc: Temporal  SpO2: 98%  Weight: 287 lb 9.6 oz (130.5 kg)  Height: 5\' 5"  (1.651 m)    Body mass index is 47.86 kg/m.  Physical Exam  Constitutional: Patient appears well-developed and well-nourished. Obese  No distress.  HEENT: head atraumatic, normocephalic, pupils equal and reactive to light,neck supple, oral mucosa not done, tender during percussion of right maxillary sinus, boggy turbinates and erythematous  Cardiovascular: Normal rate, regular rhythm and normal heart sounds.  No murmur heard. Trace  BLE edema. Pulmonary/Chest: Effort normal and breath sounds normal. No respiratory distress. Abdominal: Soft.  There is no tenderness. Muscular Skeletal: pain with abduction of right shoulder.  Psychiatric: Patient has a normal mood and affect. behavior is normal. Judgment and thought content normal.  Recent Results (from the past 2160 hour(s))  POCT HgB A1C     Status: Normal   Collection Time: 06/20/19  4:00 PM  Result Value Ref Range   Hemoglobin A1C     HbA1c POC (<> result, manual entry)     HbA1c, POC (prediabetic range)     HbA1c, POC (controlled diabetic range) 6.6 0.0 - 7.0 %    PHQ2/9: Depression screen [facility] 2/9 06/20/2019 03/30/2019 12/31/2018 09/28/2018 08/11/2018  Decreased Interest 1 0 1 1 1   Down, Depressed,  Hopeless 1 1 1  0  0  PHQ - 2 Score 2 1 2 1 1   Altered sleeping 1 0 2 1 1   Tired, decreased energy 1 1 1  0 1  Change in appetite 2 0 1 1 1   Feeling bad or failure about yourself  1 1 1  - 0  Trouble concentrating 0 0 0 0 0  Moving slowly or fidgety/restless 1 0 0 0 0  Suicidal thoughts 0 0 0 0 0  PHQ-9 Score 8 3 7 3 4   Difficult doing work/chores Somewhat difficult Somewhat difficult Somewhat difficult Somewhat difficult Not difficult at all  Some recent data might be hidden    phq 9 is positive   Fall Risk: Fall Risk  06/20/2019 06/02/2018 03/18/2018 01/04/2018 12/02/2017  Falls in the past year? 0 No No No No  Number falls in past yr: 0 - - - -  Injury with Fall? 0 - - - -     Functional Status Survey: Is the patient deaf or have difficulty hearing?: No Does the patient have difficulty seeing, even when wearing glasses/contacts?: No Does the patient have difficulty concentrating, remembering, or making decisions?: No Does the patient have difficulty walking or climbing stairs?: No Does the patient have difficulty dressing or bathing?: No Does the patient have difficulty doing errands alone such as visiting a doctor's office or shopping?: No    Assessment & Plan   1. Diabetic peripheral neuropathy associated with type 2 diabetes mellitus (HCC)  - POCT HgB A1C - Semaglutide (RYBELSUS) 7 MG TABS; Take 1 tablet by mouth daily.  Dispense: 30 tablet; Refill: 2 - dapagliflozin propanediol (FARXIGA) 10 MG TABS tablet; Take 10 mg by mouth daily.  Dispense: 30 tablet; Refill: 2 - COMPLETE METABOLIC PANEL WITH GFR - CBC with Differential/Platelet - Microalbumin / creatinine urine ratio  Advised to titrate down Xutophy and add [place] first followed by Rybelsus. Discussed possible side effects of medications  2. GAD (generalized anxiety disorder)  - ALPRAZolam (ALPRAZOLAM XR) 0.5 MG 24 hr tablet; Take 1 tablet (0.5 mg total) by mouth daily.  Dispense: 30 tablet; Refill: 2  3. Depression, major,  recurrent, mild (HCC)  - DULoxetine (CYMBALTA) 30 MG capsule; Take 1-2 capsules (30-60 mg total) by mouth daily.  Dispense: 60 capsule; Refill: 0  Advised one first week and 2 after second week,c all back if she notices improvement of symptoms   4. Dyslipidemia  - Pitavastatin Calcium (LIVALO) 2 MG TABS; Take 1 tablet (2 mg total) by mouth once a week.  Dispense: 12 tablet; Refill: 0 - Lipid panel  5. 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 Fibromyalgia  - DULoxetine (CYMBALTA) 30 MG capsule; Take 1-2 capsules (30-60 mg total) by mouth daily.  Dispense: 60 capsule; Refill: 0  7. Seasonal allergic rhinitis, unspecified trigger  She has seen ENT  8. Morbid obesity with BMI of 40.0-44.9, adult [facility])  Discussed with the patient the risk posed by an increased BMI. Discussed importance of portion control, calorie counting and at least 150 minutes of physical activity weekly. Avoid sweet beverages and drink more water. Eat at least 6 servings of fruit and vegetables daily    9. Benign hypertension  - lisinopril-hydrochlorothiazide (ZESTORETIC) 10-12.5 MG tablet; Take 0.5 tablets by mouth daily.  Dispense: 30 tablet; Refill: 0  We will cut dose in half  since bp is low and we are adding [place]

## 2019-06-21 ENCOUNTER — Encounter: Payer: Self-pay | Admitting: Family Medicine

## 2019-06-21 ENCOUNTER — Other Ambulatory Visit: Payer: Self-pay | Admitting: Family Medicine

## 2019-06-21 MED ORDER — CYCLOBENZAPRINE HCL 5 MG PO TABS: 5.0000 mg | ORAL_TABLET | Freq: Three times a day (TID) | ORAL | 0 refills | Status: DC | PRN

## 2019-06-22 ENCOUNTER — Other Ambulatory Visit: Payer: Self-pay | Admitting: Family Medicine

## 2019-06-22 DIAGNOSIS — E785 Hyperlipidemia, unspecified: Secondary | ICD-10-CM

## 2019-06-22 MED ORDER — LIVALO 2 MG PO TABS: 1.0000 | ORAL_TABLET | ORAL | 1 refills | Status: DC

## 2019-06-22 NOTE — Telephone Encounter (Signed)
Pt went to pick up her Rx for Pitavastatin Calcium (LIVALO) 2 MG TABS  And the cost was more than sending the full Rx for 30 days. Pt would like the normal Rx sent for the full amount for 30 days for insurance to fully cover instead of the 12 tablets , which cost more  / please advise

## 2019-06-23 ENCOUNTER — Encounter: Payer: Self-pay | Admitting: Family Medicine

## 2019-06-23 LAB — HM DIABETES EYE EXAM

## 2019-06-24 LAB — TEST AUTHORIZATION

## 2019-06-24 LAB — COMPLETE METABOLIC PANEL WITH GFR
AG Ratio: 1.3 (calc) (ref 1.0–2.5)
ALT: 11 U/L (ref 6–29)
AST: 15 U/L (ref 10–35)
Albumin: 3.6 g/dL (ref 3.6–5.1)
Alkaline phosphatase (APISO): 81 U/L (ref 37–153)
BUN: 15 mg/dL (ref 7–25)
CO2: 27 mmol/L (ref 20–32)
Calcium: 8.9 mg/dL (ref 8.6–10.4)
Chloride: 105 mmol/L (ref 98–110)
Creat: 0.85 mg/dL (ref 0.50–1.05)
GFR, Est African American: 89 mL/min/{1.73_m2} (ref >=60)
GFR, Est Non African American: 77 mL/min/{1.73_m2} (ref >=60)
Globulin: 2.7 g/dL (calc) (ref 1.9–3.7)
Glucose, Bld: 199 mg/dL — ABNORMAL HIGH (ref 65–99)
Potassium: 3.8 mmol/L (ref 3.5–5.3)
Sodium: 142 mmol/L (ref 135–146)
Total Bilirubin: 0.2 mg/dL (ref 0.2–1.2)
Total Protein: 6.3 g/dL (ref 6.1–8.1)

## 2019-06-24 LAB — CBC WITH DIFFERENTIAL/PLATELET
Absolute Monocytes: 584 cells/uL (ref 200–950)
Basophils Absolute: 72 cells/uL (ref 0–200)
Basophils Relative: 0.9 %
Eosinophils Absolute: 400 cells/uL (ref 15–500)
Eosinophils Relative: 5 %
HCT: 35 % (ref 35.0–45.0)
Hemoglobin: 11.5 g/dL — ABNORMAL LOW (ref 11.7–15.5)
Lymphs Abs: 2120 cells/uL (ref 850–3900)
MCH: 26.6 pg — ABNORMAL LOW (ref 27.0–33.0)
MCHC: 32.9 g/dL (ref 32.0–36.0)
MCV: 81 fL (ref 80.0–100.0)
MPV: 11.4 fL (ref 7.5–12.5)
Monocytes Relative: 7.3 %
Neutro Abs: 4824 cells/uL (ref 1500–7800)
Neutrophils Relative %: 60.3 %
Platelets: 286 10*3/uL (ref 140–400)
RBC: 4.32 10*6/uL (ref 3.80–5.10)
RDW: 12.8 % (ref 11.0–15.0)
Total Lymphocyte: 26.5 %
WBC: 8 10*3/uL (ref 3.8–10.8)

## 2019-06-24 LAB — LIPID PANEL
Cholesterol: 174 mg/dL (ref <200)
HDL: 52 mg/dL (ref >=50)
LDL Cholesterol (Calc): 94 mg/dL (calc)
Non-HDL Cholesterol (Calc): 122 mg/dL (calc) (ref <130)
Total CHOL/HDL Ratio: 3.3 (calc) (ref <5.0)
Triglycerides: 184 mg/dL — ABNORMAL HIGH (ref <150)

## 2019-06-24 LAB — MICROALBUMIN / CREATININE URINE RATIO
Creatinine, Urine: 146 mg/dL (ref 20–275)
Microalb Creat Ratio: 1 mcg/mg creat (ref <30)
Microalb, Ur: 0.2 mg/dL

## 2019-06-24 LAB — IRON,TIBC AND FERRITIN PANEL
%SAT: 7 % (calc) — ABNORMAL LOW (ref 16–45)
Ferritin: 85 ng/mL (ref 16–232)
Iron: 20 ug/dL — ABNORMAL LOW (ref 45–160)
TIBC: 289 mcg/dL (calc) (ref 250–450)

## 2019-06-27 ENCOUNTER — Telehealth: Payer: Self-pay

## 2019-06-27 ENCOUNTER — Other Ambulatory Visit: Payer: Self-pay | Admitting: Family Medicine

## 2019-06-27 DIAGNOSIS — E785 Hyperlipidemia, unspecified: Secondary | ICD-10-CM

## 2019-06-27 MED ORDER — PRAVASTATIN SODIUM 20 MG PO TABS: 20.0000 mg | ORAL_TABLET | ORAL | 0 refills | Status: DC

## 2019-06-27 NOTE — Telephone Encounter (Unsigned)
Copied from [place] [id number]. Topic: General - Call Back - No Documentation >> Jun 23, 2019  2:23 PM Erick Blinks wrote: Reason for CRM: Pt requesting call from nurse staff - to discuss results from recent labs.  [phone]

## 2019-06-27 NOTE — Telephone Encounter (Signed)
Patient states she does not like to take it daily due to the muscle cramps but is willing to try the generic for a cheaper price.

## 2019-06-27 NOTE — Telephone Encounter (Signed)
Patient states she the Livalo is more expensive taking it once a week and wanted to know could you switch the prescription to daily.

## 2019-06-29 ENCOUNTER — Ambulatory Visit: Payer: BLUE CROSS/BLUE SHIELD | Admitting: Family Medicine

## 2019-07-03 ENCOUNTER — Encounter: Payer: Self-pay | Admitting: Family Medicine

## 2019-07-04 ENCOUNTER — Other Ambulatory Visit: Payer: Self-pay | Admitting: Family Medicine

## 2019-07-04 DIAGNOSIS — M797 Fibromyalgia: Secondary | ICD-10-CM

## 2019-07-04 DIAGNOSIS — M2550 Pain in unspecified joint: Secondary | ICD-10-CM

## 2019-07-17 ENCOUNTER — Encounter: Payer: Self-pay | Admitting: Family Medicine

## 2019-07-17 ENCOUNTER — Other Ambulatory Visit: Payer: Self-pay | Admitting: Family Medicine

## 2019-07-18 ENCOUNTER — Other Ambulatory Visit: Payer: Self-pay | Admitting: Family Medicine

## 2019-07-18 DIAGNOSIS — E1142 Type 2 diabetes mellitus with diabetic polyneuropathy: Secondary | ICD-10-CM

## 2019-07-30 ENCOUNTER — Other Ambulatory Visit: Payer: Self-pay | Admitting: Family Medicine

## 2019-08-01 MED ORDER — PRAVASTATIN SODIUM 20 MG PO TABS: 20.0000 mg | ORAL_TABLET | ORAL | 0 refills | Status: DC

## 2019-08-05 ENCOUNTER — Encounter: Payer: Self-pay | Admitting: Family Medicine

## 2019-08-08 ENCOUNTER — Other Ambulatory Visit: Payer: Self-pay

## 2019-08-08 DIAGNOSIS — E1142 Type 2 diabetes mellitus with diabetic polyneuropathy: Secondary | ICD-10-CM

## 2019-08-08 MED ORDER — GLUCOSE BLOOD VI STRP: ORAL_STRIP | 12 refills | Status: DC

## 2019-08-08 NOTE — Telephone Encounter (Signed)
Hi Dr. Ancil Boozer, in [place] it shows that the prescription for test strips were sent over on 03/30/19, but for some reason the pharmacy only shows an expired prescription from awhile ago, can it be resent please

## 2019-08-11 ENCOUNTER — Encounter: Payer: Self-pay | Admitting: Family Medicine

## 2019-08-23 ENCOUNTER — Encounter: Payer: Self-pay | Admitting: Family Medicine

## 2019-08-24 ENCOUNTER — Other Ambulatory Visit: Payer: Self-pay | Admitting: Family Medicine

## 2019-08-24 MED ORDER — XULTOPHY 100-3.6 UNIT-MG/ML ~~LOC~~ SOPN: 40.0000 [IU] | PEN_INJECTOR | Freq: Every day | SUBCUTANEOUS | 0 refills | Status: DC

## 2019-09-07 ENCOUNTER — Encounter: Payer: Self-pay | Admitting: Family Medicine

## 2019-09-21 ENCOUNTER — Ambulatory Visit: Payer: BC Managed Care – PPO | Admitting: Family Medicine

## 2019-09-28 ENCOUNTER — Telehealth: Payer: Self-pay | Admitting: Family Medicine

## 2019-09-28 ENCOUNTER — Encounter: Payer: Self-pay | Admitting: Family Medicine

## 2019-09-28 ENCOUNTER — Ambulatory Visit (INDEPENDENT_AMBULATORY_CARE_PROVIDER_SITE_OTHER): Payer: BC Managed Care – PPO | Admitting: Family Medicine

## 2019-09-28 ENCOUNTER — Other Ambulatory Visit: Payer: Self-pay | Admitting: Family Medicine

## 2019-09-28 ENCOUNTER — Other Ambulatory Visit: Payer: Self-pay

## 2019-09-28 VITALS — BP 128/70 | HR 84 | Resp 16 | Ht 65.0 in | Wt 285.7 lb

## 2019-09-28 DIAGNOSIS — Z6841 Body Mass Index (BMI) 40.0 and over, adult: Secondary | ICD-10-CM

## 2019-09-28 DIAGNOSIS — M797 Fibromyalgia: Secondary | ICD-10-CM

## 2019-09-28 DIAGNOSIS — F5101 Primary insomnia: Secondary | ICD-10-CM

## 2019-09-28 DIAGNOSIS — E1142 Type 2 diabetes mellitus with diabetic polyneuropathy: Secondary | ICD-10-CM

## 2019-09-28 DIAGNOSIS — F411 Generalized anxiety disorder: Secondary | ICD-10-CM | POA: Diagnosis not present

## 2019-09-28 DIAGNOSIS — I10 Essential (primary) hypertension: Secondary | ICD-10-CM

## 2019-09-28 DIAGNOSIS — Z23 Encounter for immunization: Secondary | ICD-10-CM | POA: Diagnosis not present

## 2019-09-28 DIAGNOSIS — E785 Hyperlipidemia, unspecified: Secondary | ICD-10-CM

## 2019-09-28 LAB — POCT GLYCOSYLATED HEMOGLOBIN (HGB A1C): HbA1c, POC (controlled diabetic range): 7.9 % — AB (ref 0.0–7.0)

## 2019-09-28 MED ORDER — METFORMIN HCL ER 750 MG PO TB24: 1500.0000 mg | ORAL_TABLET | Freq: Every day | ORAL | 1 refills | Status: DC

## 2019-09-28 MED ORDER — XULTOPHY 100-3.6 UNIT-MG/ML ~~LOC~~ SOPN: 40.0000 [IU] | PEN_INJECTOR | Freq: Every day | SUBCUTANEOUS | 0 refills | Status: DC

## 2019-09-28 MED ORDER — LISINOPRIL-HYDROCHLOROTHIAZIDE 10-12.5 MG PO TABS: 0.5000 | ORAL_TABLET | Freq: Every day | ORAL | 1 refills | Status: DC

## 2019-09-28 MED ORDER — PREGABALIN 75 MG PO CAPS: 75.0000 mg | ORAL_CAPSULE | Freq: Two times a day (BID) | ORAL | 0 refills | Status: DC

## 2019-09-28 MED ORDER — ALPRAZOLAM ER 0.5 MG PO TB24: 0.5000 mg | ORAL_TABLET | Freq: Every day | ORAL | 2 refills | Status: DC

## 2019-09-28 NOTE — Telephone Encounter (Signed)
Medication: ALPRAZolam (ALPRAZOLAM XR) 0.5 MG 24 hr tablet [id number]   Pharmacy:  [facility] [address] [place] [phone] (Phone) [phone] (Fax)   Pt states that this was not called in at appointment. Please advise.

## 2019-09-28 NOTE — Progress Notes (Signed)
Name: Lindsay Mcdonald   MRN: [id number]    DOB: [birth year]   Date:09/28/2019       Progress Note  Subjective  Chief Complaint  Chief Complaint  Patient presents with  . Hypertension  . Diabetes  . Depression  . Fibromyalgia  . Dyslipidemia    HPI  FMS: she has a long history of feeling aching and tender worse on trunk and upper extremities. . Pain on shoulders, arms, stiff on her hips, no effusion of joints . Feels a little stiff when first gets up, she has some mental fogginess, she is back on Lyrica but skips doses, explained that medication will help with her pain.   DMII: onXultophy 50 units daily,also Metformin most days of the week,not checking glucose at home.HbgA1C down from 8.8%, to 7.9%,l7.6%,7%,  6.6%, she asked to switch from xultophy to rybelsus and [place] but glucose spiked and he is back on Xultophy 48 units, glucose lately has been in the low 100 , A1C today up from 6.6 % to 7.9%, she is still on metformin Discussed low carbohydrate diet again.   HTN: taking lisinopril/HCTZ without any side effects, denies chest pain,SOBpalpitationor dizziness. BP is excellent and is doing well   Hyperlipidemia;back on Pravastatin and only taking it once a week, because it causes worsening of myalgias  Major Depression with anxiety: she took Zoloft in the past, states Cymbalta caused "weird feeling " but did not take long enough, she was afraid to start Lexapro because of side effects. She is also worried about depending on SSRI, but not bothered by Alprazolam XR. Explained that BZD's are controlled and she should try to start SSRI to be able to come off BZD. Shetried taking Prozac for one month Summer of 2019 however did not noticed improvement of symptoms of depression or anxiety and stopped it on her Audie Clear was going to try duloxetine again to see if would help with FMS but she never filled rx. Explained that xanax if really not the best option for her symptoms.    Morbid Obesity:weight has gone up, seen another provider and had [place] , LH and TSH done and normal for her. She stopped going to weight loss clinic because of cost. Explained that carbohydrate restrictive diet will help her lose weight   Patient Active Problem List   Diagnosis Date Noted  . History of marijuana use 03/30/2019  . Fibromyalgia 12/31/2018  . Venous vascular malformations 03/27/2016  . Diabetic peripheral neuropathy associated with type 2 diabetes mellitus ([place]) 09/26/2015  . Non compliance w medication regimen 09/26/2015  . Seasonal allergic rhinitis 09/26/2015  . Muscle spasms of neck 06/19/2015  . Insomnia 06/19/2015  . Primary osteoarthritis of right knee 06/17/2015  . Depression, major, recurrent, mild ([name]) 06/17/2015  . Diabetes mellitus type 2, uncontrolled ([name]) 06/17/2015  . Dyslipidemia 06/17/2015  . Benign hypertension 06/17/2015  . Morbid obesity with BMI of 40.0-44.9, adult ([name]) 06/17/2015  . Vitamin D deficiency 06/17/2015    Past Surgical History:  Procedure Laterality Date  . BREAST REDUCTION SURGERY    . nasal endoscopic    . OOPHORECTOMY  ?   one ovary removed   . REDUCTION MAMMAPLASTY Bilateral 2000  . removal of ovary      Family History  Problem Relation Age of Onset  . Diabetes Mother   . Cancer Mother 36       Breast  . Breast cancer Mother 13  . Gout Mother   . Diabetes Daughter  Oldest Daughter  . Stroke Maternal Uncle   . Breast cancer Maternal Aunt 27  . Diabetes Father   . Kidney cancer Maternal Aunt   . Kidney disease Maternal Aunt     Social History   Socioeconomic History  . Marital status: Divorced    Spouse name: Not on file  . Number of children: 2  . Years of education: Not on file  . Highest education level: Some college, no degree  Occupational History  . Occupation: Designer, television/film set   Social Needs  . Financial resource strain: Somewhat hard  . Food insecurity    Worry: Never true    Inability:  Never true  . Transportation needs    Medical: No    Non-medical: No  Tobacco Use  . Smoking status: Never Smoker  . Smokeless tobacco: Never Used  Substance and Sexual Activity  . Alcohol use: No    Alcohol/week: 0.0 standard drinks  . Drug use: No  . Sexual activity: Not Currently    Partners: Male    Birth control/protection: None  Lifestyle  . Physical activity    Days per week: 3 days    Minutes per session: 20 min  . Stress: Very much  Relationships  . Social connections    Talks on phone: More than three times a week    Gets together: Once a week    Attends religious service: More than 4 times per year    Active member of club or organization: No    Attends meetings of clubs or organizations: Never    Relationship status: Divorced  . Intimate partner violence    Fear of current or ex partner: No    Emotionally abused: No    Physically abused: No    Forced sexual activity: No  Other Topics Concern  . Not on file  Social History Narrative   Divorced and he died since   Working at [employer] blow[employer]/design[employer].      Current Outpatient Medications:  .  ALPRAZolam (ALPRAZOLAM XR) 0.5 MG 24 hr tablet, Take 1 tablet (0.5 mg total) by mouth daily., Disp: 30 tablet, Rfl: 2 .  aspirin 81 MG tablet, Take 81 mg by mouth daily., Disp: , Rfl:  .  cholecalciferol (VITAMIN D) 1000 UNITS tablet, Take 1,000 Units by mouth 2 (two) times daily.  , Disp: , Rfl:  .  diphenhydrAMINE-APAP 25-500 & 500 MG MISC, Take 1 tablet by mouth every evening., Disp: 30 each, Rfl: 0 .  glucose blood test strip, Use as instructed, Disp: 100 each, Rfl: 12 .  Insulin Degludec-Liraglutide (XULTOPHY) 100-3.6 UNIT-MG/ML SOPN, Inject 40-50 Units into the skin daily., Disp: 15 mL, Rfl: 0 .  Insulin Pen Needle 32G X 6 MM MISC, 1 each by Does not apply route daily., Disp: 100 each, Rfl: 1 .  lisinopril-hydrochlorothiazide (ZESTORETIC) 10-12.5 MG tablet, Take 0.5 tablets by mouth daily., Disp: 90 tablet, Rfl: 1 .   magnesium 30 MG tablet, Take 30 mg by mouth., Disp: , Rfl:  .  Melatonin 10 MG TABS, Take by mouth., Disp: , Rfl:  .  meloxicam (MOBIC) 15 MG tablet, Take 1 tablet (15 mg total) by mouth daily., Disp: 30 tablet, Rfl: 0 .  metFORMIN (GLUCOPHAGE-XR) 750 MG 24 hr tablet, Take 2 tablets (1,500 mg total) by mouth daily with breakfast., Disp: 180 tablet, Rfl: 1 .  Multiple Vitamin (MULITIVITAMIN WITH MINERALS) TABS, Take 1 tablet by mouth daily.  , Disp: , Rfl:  .  [facility] DELICA  LANCETS FINE MISC, 1 Units by Does not apply route 2 (two) times daily., Disp: 100 each, Rfl: 2 .  pravastatin (PRAVACHOL) 20 MG tablet, Take 1 tablet (20 mg total) by mouth 3 (three) times a week., Disp: 30 tablet, Rfl: 0 .  pregabalin (LYRICA) 75 MG capsule, Take 1 capsule (75 mg total) by mouth 2 (two) times daily., Disp: 60 capsule, Rfl: 0  Allergies  Allergen Reactions  . Atorvastatin     and pravastatin caused muscle cramps, also simvastatin  . Baclofen     spasms  . Compazine Other (See Comments)    Twitching and spasms of neck muscles  . Penicillins     rash    I personally reviewed active problem list, medication list, allergies, family history, social history, health maintenance with the patient/caregiver today.   ROS  Constitutional: Negative for fever or weight change.  Respiratory: Negative for cough and shortness of breath.   Cardiovascular: Negative for chest pain or palpitations.  Gastrointestinal: Negative for abdominal pain, no bowel changes.  Musculoskeletal: Negative for gait problem or joint swelling.  Skin: Negative for rash.  Neurological: Negative for dizziness or headache.  No other specific complaints in a complete review of systems (except as listed in HPI above).  Objective  Vitals:   09/28/19 1038  BP: 128/70  Pulse: 84  Resp: 16  Weight: 285 lb 11.2 oz (129.6 kg)  Height: 5\' 5"  (1.651 m)    Body mass index is 47.54 kg/m.  Physical Exam  Constitutional: Patient  appears well-developed and well-nourished. Obese  No distress.  HEENT: head atraumatic, normocephalic, pupils equal and reactive to light Cardiovascular: Normal rate, regular rhythm and normal heart sounds.  No murmur heard. No BLE edema. Pulmonary/Chest: Effort normal and breath sounds normal. No respiratory distress. Abdominal: Soft.  There is no tenderness. Psychiatric: Patient has a normal mood and affect. behavior is normal. Judgment and thought content normal.   Diabetic Foot Exam: Diabetic Foot Exam - Simple   Simple Foot Form Diabetic Foot exam was performed with the following findings: Yes 09/28/2019 11:19 AM  Visual Inspection No deformities, no ulcerations, no other skin breakdown bilaterally: Yes Sensation Testing Intact to touch and monofilament testing bilaterally: Yes Pulse Check Posterior Tibialis and Dorsalis pulse intact bilaterally: Yes Comments      PHQ2/9: Depression screen [facility] 2/9 09/28/2019 06/20/2019 03/30/2019 12/31/2018 09/28/2018  Decreased Interest 1 1 0 1 1  Down, Depressed, Hopeless 1 1 1 1  0  PHQ - 2 Score 2 2 1 2 1   Altered sleeping 1 1 0 2 1  Tired, decreased energy 1 1 1 1  0  Change in appetite 1 2 0 1 1  Feeling bad or failure about yourself  1 1 1 1  -  Trouble concentrating 1 0 0 0 0  Moving slowly or fidgety/restless 1 1 0 0 0  Suicidal thoughts 0 0 0 0 0  PHQ-9 Score 8 8 3 7 3   Difficult doing work/chores Somewhat difficult Somewhat difficult Somewhat difficult Somewhat difficult Somewhat difficult  Some recent data might be hidden    phq 9 is positive  GAD 7 : Generalized Anxiety Score 09/28/2019 12/31/2018 09/28/2018 08/11/2018  Nervous, Anxious, on Edge 1 1 - 1  Control/stop worrying 1 1 1 1   Worry too much - different things 1 1 1 1   Trouble relaxing 1 1 1 1   Restless 1 1 0 0  Easily annoyed or irritable 1 0 1 1  Afraid - awful  might happen 1 1 - 2  Total GAD 7 Score 7 6 - 7  Anxiety Difficulty Somewhat difficult - - Somewhat difficult     Fall Risk: Fall Risk  09/28/2019 06/20/2019 06/02/2018 03/18/2018 01/04/2018  Falls in the past year? 0 0 No No No  Number falls in past yr: 0 0 - - -  Injury with Fall? 0 0 - - -    Functional Status Survey: Is the patient deaf or have difficulty hearing?: No Does the patient have difficulty seeing, even when wearing glasses/contacts?: No Does the patient have difficulty concentrating, remembering, or making decisions?: Yes Does the patient have difficulty walking or climbing stairs?: No Does the patient have difficulty dressing or bathing?: No Does the patient have difficulty doing errands alone such as visiting a doctor's office or shopping?: No    Assessment & Plan   1. Diabetic peripheral neuropathy associated with type 2 diabetes mellitus (HCC)  - POCT HgB A1C - Insulin Degludec-Liraglutide (XULTOPHY) 100-3.6 UNIT-MG/ML SOPN; Inject 40-50 Units into the skin daily.  Dispense: 15 mL; Refill: 0 - metFORMIN (GLUCOPHAGE-XR) 750 MG 24 hr tablet; Take 2 tablets (1,500 mg total) by mouth daily with breakfast.  Dispense: 180 tablet; Refill: 1  2. Need for immunization against influenza  - Flu Vaccine QUAD 36+ mos IM  3. Benign hypertension  - lisinopril-hydrochlorothiazide (ZESTORETIC) 10-12.5 MG tablet; Take 0.5 tablets by mouth daily.  Dispense: 90 tablet; Refill: 1  4. GAD (generalized anxiety disorder)  - pregabalin (LYRICA) 75 MG capsule; Take 1 capsule (75 mg total) by mouth 2 (two) times daily.  Dispense: 60 capsule; Refill: 0  5. Fibromyalgia  - pregabalin (LYRICA) 75 MG capsule; Take 1 capsule (75 mg total) by mouth 2 (two) times daily.  Dispense: 60 capsule; Refill: 0  6. Dyslipidemia  Only taking pravastatin once a week  7. Morbid obesity with BMI of 40.0-44.9, adult [facility])  Discussed with the patient the risk posed by an increased BMI. Discussed importance of portion control, calorie counting and at least 150 minutes of physical activity weekly. Avoid sweet  beverages and drink more water. Eat at least 6 servings of fruit and vegetables daily   8. Primary insomnia  Needs to take Lyrica every night

## 2019-09-29 ENCOUNTER — Encounter: Payer: Self-pay | Admitting: Family Medicine

## 2019-11-02 ENCOUNTER — Encounter: Payer: Self-pay | Admitting: Family Medicine

## 2019-11-02 NOTE — Telephone Encounter (Signed)
Please advise 

## 2019-11-04 ENCOUNTER — Other Ambulatory Visit: Payer: Self-pay

## 2019-11-04 ENCOUNTER — Encounter: Payer: Self-pay | Admitting: Family Medicine

## 2019-11-04 ENCOUNTER — Ambulatory Visit (INDEPENDENT_AMBULATORY_CARE_PROVIDER_SITE_OTHER): Payer: BC Managed Care – PPO | Admitting: Family Medicine

## 2019-11-04 VITALS — Ht 65.0 in | Wt 285.0 lb

## 2019-11-04 DIAGNOSIS — J0101 Acute recurrent maxillary sinusitis: Secondary | ICD-10-CM

## 2019-11-04 DIAGNOSIS — J302 Other seasonal allergic rhinitis: Secondary | ICD-10-CM

## 2019-11-04 MED ORDER — DOXYCYCLINE HYCLATE 100 MG PO TABS: 100.0000 mg | ORAL_TABLET | Freq: Two times a day (BID) | ORAL | 0 refills | Status: DC

## 2019-11-04 MED ORDER — MONTELUKAST SODIUM 10 MG PO TABS: 10.0000 mg | ORAL_TABLET | Freq: Every day | ORAL | 3 refills | Status: DC

## 2019-11-04 NOTE — Progress Notes (Deleted)
Name: Lindsay Mcdonald   MRN: [id number]    DOB: [birth year]   Date:11/04/2019       Progress Note  Subjective:    Chief Complaint  No chief complaint on file.   I connected with  Christain [place]  on 11/04/19 at  3:00 PM EST by a video enabled telemedicine application and verified that I am speaking with the correct person using two identifiers.  I discussed the limitations of evaluation and management by telemedicine and the availability of in person appointments. The patient expressed understanding and agreed to proceed. Staff also discussed with the patient that there may be a patient responsible charge related to this service. Patient Location: *** Provider Location: *** Additional Individuals present: ***  HPI   Patient Active Problem List   Diagnosis Date Noted  . History of marijuana use 03/30/2019  . Fibromyalgia 12/31/2018  . Venous vascular malformations 03/27/2016  . Diabetic peripheral neuropathy associated with type 2 diabetes mellitus ([place]) 09/26/2015  . Non compliance w medication regimen 09/26/2015  . Seasonal allergic rhinitis 09/26/2015  . Muscle spasms of neck 06/19/2015  . Insomnia 06/19/2015  . Primary osteoarthritis of right knee 06/17/2015  . Depression, major, recurrent, mild ([place]) 06/17/2015  . Diabetes mellitus type 2, uncontrolled ([place]) 06/17/2015  . Dyslipidemia 06/17/2015  . Benign hypertension 06/17/2015  . Morbid obesity with BMI of 40.0-44.9, adult ([place]) 06/17/2015  . Vitamin D deficiency 06/17/2015    Social History   Tobacco Use  . Smoking status: Never Smoker  . Smokeless tobacco: Never Used  Substance Use Topics  . Alcohol use: No    Alcohol/week: 0.0 standard drinks     Current Outpatient Medications:  .  ALPRAZolam (ALPRAZOLAM XR) 0.5 MG 24 hr tablet, Take 1 tablet (0.5 mg total) by mouth daily., Disp: 30 tablet, Rfl: 2 .  aspirin 81 MG tablet, Take 81 mg by mouth daily., Disp: , Rfl:  .  cholecalciferol (VITAMIN D) 1000 UNITS  tablet, Take 1,000 Units by mouth 2 (two) times daily.  , Disp: , Rfl:  .  diphenhydrAMINE-APAP 25-500 & 500 MG MISC, Take 1 tablet by mouth every evening., Disp: 30 each, Rfl: 0 .  glucose blood test strip, Use as instructed, Disp: 100 each, Rfl: 12 .  Insulin Degludec-Liraglutide (XULTOPHY) 100-3.6 UNIT-MG/ML SOPN, Inject 40-50 Units into the skin daily., Disp: 15 mL, Rfl: 0 .  Insulin Pen Needle 32G X 6 MM MISC, 1 each by Does not apply route daily., Disp: 100 each, Rfl: 1 .  lisinopril-hydrochlorothiazide (ZESTORETIC) 10-12.5 MG tablet, Take 0.5 tablets by mouth daily., Disp: 90 tablet, Rfl: 1 .  magnesium 30 MG tablet, Take 30 mg by mouth., Disp: , Rfl:  .  Melatonin 10 MG TABS, Take by mouth., Disp: , Rfl:  .  meloxicam (MOBIC) 15 MG tablet, Take 1 tablet (15 mg total) by mouth daily., Disp: 30 tablet, Rfl: 0 .  metFORMIN (GLUCOPHAGE-XR) 750 MG 24 hr tablet, Take 2 tablets (1,500 mg total) by mouth daily with breakfast., Disp: 180 tablet, Rfl: 1 .  Multiple Vitamin (MULITIVITAMIN WITH MINERALS) TABS, Take 1 tablet by mouth daily.  , Disp: , Rfl:  .  ONETOUCH DELICA LANCETS FINE MISC, 1 Units by Does not apply route 2 (two) times daily., Disp: 100 each, Rfl: 2 .  pravastatin (PRAVACHOL) 20 MG tablet, Take 1 tablet (20 mg total) by mouth 3 (three) times a week., Disp: 30 tablet, Rfl: 0 .  pregabalin (LYRICA) 75 MG capsule, Take 1  capsule (75 mg total) by mouth 2 (two) times daily., Disp: 60 capsule, Rfl: 0  Allergies  Allergen Reactions  . Atorvastatin     and pravastatin caused muscle cramps, also simvastatin  . Baclofen     spasms  . Compazine Other (See Comments)    Twitching and spasms of neck muscles  . Penicillins     rash    I personally reviewed {Reviewed:[id number]} with the patient/caregiver today.  ROS   Objective:   Virtual encounter, vitals limited, only able to obtain the following There were no vitals filed for this visit. There is no height or weight on file to  calculate BMI. Nursing Note and Vital Signs reviewed.  Physical Exam @PHYSEXAM @ PE limited by telephone encounter  No results found for this or any previous visit (from the past 72 hour(s)).  Assessment and Plan:   No diagnosis found.   -Red flags and when to present for emergency care or RTC including fever >101.32F, chest pain, shortness of breath, new/worsening/un-resolving symptoms, *** reviewed with patient at time of visit. Follow up and care instructions discussed and provided in AVS. - I discussed the assessment and treatment plan with the patient. The patient was provided an opportunity to ask questions and all were answered. The patient agreed with the plan and demonstrated an understanding of the instructions.  I provided *** minutes of non-face-to-face time during this encounter.  Delsa Grana, PA-C 11/04/19 1:11 PM

## 2019-11-04 NOTE — Progress Notes (Signed)
Name: Lindsay Mcdonald   MRN: [id number]    DOB: 26-Aug-1961   Date:11/04/2019       Progress Note  Subjective:    Chief Complaint  Chief Complaint  Patient presents with  . Sinusitis    onset 3 weeks, congestion, drainage, runny nose and wheezing    I connected with  Christain Sacramento on 11/04/19 at  3:00 PM EST by telephone and verified that I am speaking with the correct person using two identifiers.   I discussed the limitations, risks, security and privacy concerns of performing an evaluation and management service by telephone and the availability of in person appointments. Staff also discussed with the patient that there may be a patient responsible charge related to this service. Patient Location: home Provider Location: [facility] Additional Individuals present: none  Pt has hx of recurrent rhinosinusitis and chronic AR, has been worse the last 3-4 weeks.    Sinusitis This is a recurrent problem. The current episode started 1 to 4 weeks ago (3 weeks). The problem has been gradually worsening since onset. There has been no fever. The pain is moderate (moderate to severe). Associated symptoms include congestion, coughing (post nasal drip or a tickle), a hoarse voice (scratchy), sinus pressure and sneezing. Pertinent negatives include no chills, diaphoresis, ear pain, headaches, neck pain, shortness of breath or sore throat. Past treatments include saline sprays. The treatment provided no relief.  On zyrtec some days, and not taking her flonase right now.  Also has tried singulair in the past but also not currently taking because of concerns with new black box warning.  She is established with ENT she has had 3 prior surgeries, her current pressure is located to her right sinus which does tend to be the side that she is very symptomatic on.  She is having some postnasal drip coughing she feels like her throat is a little scratchy may be even hears a wheeze in her throat when she is  lying down at night.  She is able to get some very very thick purulent green mucoid discharge out of her sinuses sometimes and occasionally very thick from the right side and it does seem to open her back up and provide a little temporary relief but overall for the past 4 weeks she has been sneezing much more, she has been moving and her symptoms have just been much more severe    Patient Active Problem List   Diagnosis Date Noted  . History of marijuana use 03/30/2019  . Fibromyalgia 12/31/2018  . Venous vascular malformations 03/27/2016  . Diabetic peripheral neuropathy associated with type 2 diabetes mellitus ([place]) 09/26/2015  . Non compliance w medication regimen 09/26/2015  . Seasonal allergic rhinitis 09/26/2015  . Muscle spasms of neck 06/19/2015  . Insomnia 06/19/2015  . Primary osteoarthritis of right knee 06/17/2015  . Depression, major, recurrent, mild ([place]) 06/17/2015  . Diabetes mellitus type 2, uncontrolled ([place]) 06/17/2015  . Dyslipidemia 06/17/2015  . Benign hypertension 06/17/2015  . Morbid obesity with BMI of 40.0-44.9, adult ([place]) 06/17/2015  . Vitamin D deficiency 06/17/2015    Social History   Tobacco Use  . Smoking status: Never Smoker  . Smokeless tobacco: Never Used  Substance Use Topics  . Alcohol use: No    Alcohol/week: 0.0 standard drinks     Current Outpatient Medications:  .  ALPRAZolam (ALPRAZOLAM XR) 0.5 MG 24 hr tablet, Take 1 tablet (0.5 mg total) by mouth daily., Disp: 30 tablet, Rfl: 2 .  aspirin 81 MG tablet, Take 81 mg by mouth daily., Disp: , Rfl:  .  cholecalciferol (VITAMIN D) 1000 UNITS tablet, Take 1,000 Units by mouth 2 (two) times daily.  , Disp: , Rfl:  .  Insulin Degludec-Liraglutide (XULTOPHY) 100-3.6 UNIT-MG/ML SOPN, Inject 40-50 Units into the skin daily., Disp: 15 mL, Rfl: 0 .  lisinopril-hydrochlorothiazide (ZESTORETIC) 10-12.5 MG tablet, Take 0.5 tablets by mouth daily., Disp: 90 tablet, Rfl: 1 .  Melatonin 10 MG TABS, Take  by mouth., Disp: , Rfl:  .  meloxicam (MOBIC) 15 MG tablet, Take 1 tablet (15 mg total) by mouth daily., Disp: 30 tablet, Rfl: 0 .  metFORMIN (GLUCOPHAGE-XR) 750 MG 24 hr tablet, Take 2 tablets (1,500 mg total) by mouth daily with breakfast., Disp: 180 tablet, Rfl: 1 .  Multiple Vitamin (MULITIVITAMIN WITH MINERALS) TABS, Take 1 tablet by mouth daily.  , Disp: , Rfl:  .  pravastatin (PRAVACHOL) 20 MG tablet, Take 1 tablet (20 mg total) by mouth 3 (three) times a week., Disp: 30 tablet, Rfl: 0 .  pregabalin (LYRICA) 75 MG capsule, Take 1 capsule (75 mg total) by mouth 2 (two) times daily., Disp: 60 capsule, Rfl: 0 .  diphenhydrAMINE-APAP 25-500 & 500 MG MISC, Take 1 tablet by mouth every evening., Disp: 30 each, Rfl: 0 .  glucose blood test strip, Use as instructed, Disp: 100 each, Rfl: 12 .  Insulin Pen Needle 32G X 6 MM MISC, 1 each by Does not apply route daily., Disp: 100 each, Rfl: 1 .  magnesium 30 MG tablet, Take 30 mg by mouth., Disp: , Rfl:  .  ONETOUCH DELICA LANCETS FINE MISC, 1 Units by Does not apply route 2 (two) times daily., Disp: 100 each, Rfl: 2  Allergies  Allergen Reactions  . Atorvastatin     and pravastatin caused muscle cramps, also simvastatin  . Baclofen     spasms  . Compazine Other (See Comments)    Twitching and spasms of neck muscles  . Penicillins     rash    I personally reviewed active problem list, medication list, allergies, family history, social history, health maintenance, notes from last encounter, and last abx were June - visits reviewed with the patient/caregiver today.  Review of Systems  Constitutional: Negative.  Negative for chills, diaphoresis and fever.  HENT: Positive for congestion, hoarse voice (scratchy), sinus pressure, sinus pain and sneezing. Negative for ear discharge, ear pain, hearing loss, nosebleeds, sore throat and tinnitus.   Eyes: Negative.   Respiratory: Positive for cough (post nasal drip or a tickle). Negative for shortness  of breath and stridor.   Cardiovascular: Negative.   Gastrointestinal: Negative.   Genitourinary: Negative.   Musculoskeletal: Negative.  Negative for neck pain.  Skin: Negative.  Negative for itching and rash.  Neurological: Negative.  Negative for headaches.  Endo/Heme/Allergies: Negative.   Psychiatric/Behavioral: Negative.   All other systems reviewed and are negative.    Objective:    Virtual encounter, vitals limited, only able to obtain the following Today's Vitals   11/04/19 1347  Weight: 285 lb (129.3 kg)  Height: 5\' 5"  (1.651 m)   Body mass index is 47.43 kg/m. Nursing Note and Vital Signs reviewed.  Physical Exam Vitals signs and nursing note reviewed.  Neck:     Comments: Phonation clear Pulmonary:     Comments: No auditory wheeze, stridor or coughing Neurological:     Mental Status: She is alert.  Psychiatric:        Mood  and Affect: Mood normal.        Behavior: Behavior normal.     PE limited by telephone encounter  No results found for this or any previous visit (from the past 72 hour(s)).  Assessment and Plan:     ICD-10-CM   1. Acute recurrent maxillary sinusitis  J01.01 doxycycline (VIBRA-TABS) 100 MG tablet    montelukast (SINGULAIR) 10 MG tablet   3-4 weeks worsening symptoms likely from under manage seasonal allergies  2. Seasonal allergic rhinitis, unspecified trigger  J30.2 montelukast (SINGULAIR) 10 MG tablet   Resume antihistamines, steroid nasal sprays and try Singulair again   I did suggest the patient go Monday to get Covid tested both for her symptoms and because if her symptoms do not improve she would want to follow-up with her ENT but they will not allow her to do so unless she has negative Covid testing done.    Discussed other supportive and symptomatic care and treatment that she can do over-the-counter for her allergies and for sinusitis  -Red flags and when to present for emergency care or RTC including fever >101.60F,  chest pain, shortness of breath, new/worsening/un-resolving symptoms,  reviewed with patient at time of visit. Follow up and care instructions discussed and provided in AVS. - I discussed the assessment and treatment plan with the patient. The patient was provided an opportunity to ask questions and all were answered. The patient agreed with the plan and demonstrated an understanding of the instructions.  - The patient was advised to call back or seek an in-person evaluation if the symptoms worsen or if the condition fails to improve as anticipated.  I provided 12 minutes of non-face-to-face time during this encounter.  Delsa Grana, PA-C 11/04/19 3:13 PM

## 2019-11-11 ENCOUNTER — Encounter: Payer: Self-pay | Admitting: Family Medicine

## 2019-11-11 ENCOUNTER — Other Ambulatory Visit: Payer: Self-pay | Admitting: Family Medicine

## 2019-11-11 DIAGNOSIS — E1142 Type 2 diabetes mellitus with diabetic polyneuropathy: Secondary | ICD-10-CM

## 2019-11-12 ENCOUNTER — Encounter: Payer: Self-pay | Admitting: Family Medicine

## 2019-11-13 MED ORDER — XULTOPHY 100-3.6 UNIT-MG/ML ~~LOC~~ SOPN: 40.0000 [IU] | PEN_INJECTOR | Freq: Every day | SUBCUTANEOUS | 0 refills | Status: DC

## 2019-11-14 ENCOUNTER — Other Ambulatory Visit: Payer: Self-pay | Admitting: Family Medicine

## 2019-11-14 MED ORDER — BACLOFEN 10 MG PO TABS: 10.0000 mg | ORAL_TABLET | Freq: Two times a day (BID) | ORAL | 0 refills | Status: DC | PRN

## 2019-11-16 ENCOUNTER — Other Ambulatory Visit: Payer: Self-pay | Admitting: Family Medicine

## 2019-11-16 ENCOUNTER — Encounter: Payer: Self-pay | Admitting: Family Medicine

## 2019-11-16 MED ORDER — FLUTICASONE PROPIONATE 50 MCG/ACT NA SUSP: 2.0000 | Freq: Every day | NASAL | 6 refills | Status: DC

## 2019-11-18 ENCOUNTER — Encounter: Payer: Self-pay | Admitting: Family Medicine

## 2019-11-18 ENCOUNTER — Other Ambulatory Visit: Payer: Self-pay

## 2019-11-18 ENCOUNTER — Ambulatory Visit (INDEPENDENT_AMBULATORY_CARE_PROVIDER_SITE_OTHER): Payer: BC Managed Care – PPO | Admitting: Family Medicine

## 2019-11-18 ENCOUNTER — Telehealth: Payer: BC Managed Care – PPO

## 2019-11-18 DIAGNOSIS — R062 Wheezing: Secondary | ICD-10-CM

## 2019-11-18 DIAGNOSIS — J0101 Acute recurrent maxillary sinusitis: Secondary | ICD-10-CM | POA: Diagnosis not present

## 2019-11-18 MED ORDER — PREDNISONE 10 MG PO TABS: ORAL_TABLET | ORAL | 0 refills | Status: DC

## 2019-11-18 MED ORDER — GUAIFENESIN ER 600 MG PO TB12: 600.0000 mg | ORAL_TABLET | Freq: Two times a day (BID) | ORAL | 0 refills | Status: DC

## 2019-11-18 NOTE — Patient Instructions (Signed)
Diabetes Mellitus and Sick Day Management Blood sugar (glucose) can be difficult to control when you are sick. Common illnesses that can cause problems for people with diabetes (diabetes mellitus) include colds, fever, flu (influenza), nausea, vomiting, and diarrhea. These illnesses can cause stress and loss of body fluids (dehydration), and those issues can cause blood glucose levels to increase. Because of this, it is very important to take your insulin and diabetes medicines and eat some form of carbohydrate when you are sick. You should make a plan for days when you are sick (sick day plan) as part of your diabetes management plan. You and your health care provider should make this plan in advance. The following guidelines are intended to help you manage an illness that lasts for about 24 hours or less. Your health care provider may also give you more specific instructions. What do I need to do to manage my blood glucose?   Check your blood glucose every 2-4 hours, or as often as told by your health care provider.  Know your sick day treatment goals. Your target blood glucose levels may be different when you are sick.  If you use insulin, take your usual dose. ? If your blood glucose continues to be too high, you may need to take an additional insulin dose as told by your health care provider.  If you use oral diabetes medicine, you may need to stop taking it if you are not able to eat or drink normally. Ask your health care provider about whether you need to stop taking these medicines while you are sick.  If you use injectable hormone medicines other than insulin to control your diabetes, ask your health care provider about whether you need to stop taking these medicines while you are sick. What else can I do to manage my diabetes when I am sick? Check your ketones  If you have type 1 diabetes, check your urine ketones every 4 hours.  If you have type 2 diabetes, check your urine ketones  as often as told by your health care provider. Drink fluids  Drink enough fluid to keep your urine clear or pale yellow. This is especially important if you have a fever, vomiting, or diarrhea. Those symptoms can lead to dehydration.  Follow any instructions from your health care provider about beverages to avoid. ? Do not drink alcohol, caffeine, or drinks that contain a lot of sugar. Take medicines as directed  Take-over-the-counter and prescription medicines only as told by your health care provider.  Check medicine labels for added sugars. Some medicines may contain sugar or types of sugars that can raise your blood glucose level. What foods can I eat when I am sick?  You need to eat some form of carbohydrates when you are sick. You should eat 45-50 grams (45-50 g) of carbohydrates every 3-4 hours until you feel better. All of the food choices below contain about 15 g of carbohydrates. Plan ahead and keep some of these foods around so you have them if you get sick.  4-6 oz (120-177 mL) carbonated beverage that contains sugar, such as regular (not diet) soda. You may be able to drink carbonated beverages more easily if you open the beverage and let it sit at room temperature for a few minutes before drinking.   of a twin frozen ice pop.  4 oz (120 g) regular gelatin.  4 oz (120 mL) fruit juice.  4 oz (120 g) ice cream or frozen yogurt.  2   oz (60 g) sherbet.  8 oz (240 mL) clear broth or soup.  4 oz (120 g) regular custard.  4 oz (120 g) regular pudding.  8 oz (240 g) plain yogurt.  1 slice bread or toast.  6 saltine crackers.  5 vanilla wafers. Questions to ask your health care provider Consider asking the following questions so you know what to do on days when you are sick:  Should I adjust my diabetes medicines?  How often do I need to check my blood glucose?  What supplies do I need to manage my diabetes at home when I am sick?  What number can I call if I  have questions?  What foods and drinks should I avoid? Contact a health care provider if:  You develop symptoms of diabetic ketoacidosis, such as: ? Fatigue. ? Weight loss. ? Excessive thirst. ? Light-headedness. ? Fruity or sweet-smelling breath. ? Excessive urination. ? Vision changes. ? Confusion or irritability. ? Nausea. ? Vomiting. ? Rapid breathing. ? Pain in the abdomen. ? Feeling flushed.  You are unable to drink fluids without vomiting.  You have any of the following for more than 6 hours: ? Nausea. ? Vomiting. ? Diarrhea.  Your blood glucose is at or above 240 mg/dL (13.3 mmol/L), even after you take an additional insulin dose.  You have a change in how you think, feel, or act (mental status).  You develop another serious illness.  You have been sick or have had a fever for 2 days or longer and you are not getting better. Get help right away if:  Your blood glucose is lower than 54 mg/dL (3.0 mmol/L).  You have difficulty breathing.  You have moderate or high ketone levels in your urine.  You used emergency glucagon to treat low blood glucose. Summary  Blood sugar (glucose) can be difficult to control when you are sick. Common illnesses that can cause problems for people with diabetes (diabetes mellitus) include colds, fever, flu (influenza), nausea, vomiting, and diarrhea.  Illnesses can cause stress and loss of body fluids (dehydration), and those issues can cause blood glucose levels to increase.  Make a plan for days when you are sick (sick day plan) as part of your diabetes management plan. You and your health care provider should make this plan in advance.  It is very important to take your insulin and diabetes medicines and to eat some form of carbohydrate when you are sick.  Contact your health care provider if have problems managing your blood glucose levels when you are sick, or if you have been sick or had a fever for 2 days or longer and are  not getting better. This information is not intended to replace advice given to you by your health care provider. Make sure you discuss any questions you have with your health care provider. Document Released: 12/11/2003 Document Revised: 09/05/2016 Document Reviewed: 09/05/2016 Elsevier Patient Education  2020 [employer].

## 2019-11-18 NOTE — Progress Notes (Signed)
Name: Lindsay Mcdonald   MRN: [id number]    DOB: 1961/10/29   Date:11/18/2019       Progress Note  Subjective  Chief Complaint  Chief Complaint  Patient presents with  . Otalgia    right ear  . Sinusitis    pressure on right side, congested    I connected with  Christain Sacramento on 11/18/19 at  8:40 AM EST by telephone and verified that I am speaking with the correct person using two identifiers.   I discussed the limitations, risks, security and privacy concerns of performing an evaluation and management service by telephone and the availability of in person appointments. Staff also discussed with the patient that there may be a patient responsible charge related to this service. Patient Location: Home Provider Location: Office Additional Individuals present: None  HPI  Pt presents with concern for URI symptoms for about a month.  Was given antibiotics last week - is almost finished with this course (doxycycline).  She has also been using flonase which did help the left side, but the right side of her sinuses are still very congested and she is having ongoing right otalgia.  She has taken prednisone in the past and done well on this.  Has occasional cough, occasional wheezing (better since abx).  Has tried generic zyrtec, but not daily. Taking ibuprofen PRN for pain.  Denies fevers, chest pain, shortness of breath.  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  Patient Active Problem List   Diagnosis Date Noted  . Acute recurrent maxillary sinusitis 11/04/2019  . History of marijuana use 03/30/2019  . Fibromyalgia 12/31/2018  . Venous vascular malformations 03/27/2016  . Diabetic peripheral neuropathy associated with type 2 diabetes mellitus ([place]) 09/26/2015  . Non compliance w medication regimen 09/26/2015  . Seasonal allergic rhinitis 09/26/2015   . Muscle spasms of neck 06/19/2015  . Insomnia 06/19/2015  . Primary osteoarthritis of right knee 06/17/2015  . Depression, major, recurrent, mild ([place]) 06/17/2015  . Diabetes mellitus type 2, uncontrolled ([place]) 06/17/2015  . Dyslipidemia 06/17/2015  . Benign hypertension 06/17/2015  . Morbid obesity with BMI of 40.0-44.9, adult ([place]) 06/17/2015  . Vitamin D deficiency 06/17/2015    Social History   Tobacco Use  . Smoking status: Never Smoker  . Smokeless tobacco: Never Used  Substance Use Topics  . Alcohol use: No    Alcohol/week: 0.0 standard drinks     Current Outpatient Medications:  .  ALPRAZolam (ALPRAZOLAM XR) 0.5 MG 24 hr tablet, Take 1 tablet (0.5 mg total) by mouth daily., Disp: 30 tablet, Rfl: 2 .  aspirin 81 MG tablet, Take 81 mg by mouth daily., Disp: , Rfl:  .  baclofen (LIORESAL) 10 MG tablet, Take 1 tablet (10 mg total) by mouth 2 (two) times daily as needed for muscle spasms., Disp: 60 each, Rfl: 0 .  diphenhydrAMINE-APAP 25-500 & 500 MG MISC, Take 1 tablet by mouth every evening., Disp: 30 each, Rfl: 0 .  doxycycline (VIBRA-TABS) 100 MG tablet, Take 1 tablet (100 mg total) by mouth 2 (two) times daily., Disp: 20 tablet, Rfl: 0 .  fluticasone (FLONASE) 50 MCG/ACT nasal spray, Place 2 sprays into both nostrils daily., Disp: 16 g, Rfl: 6 .  glucose blood test strip, Use as instructed, Disp: 100 each, Rfl: 12 .  Insulin Degludec-Liraglutide (XULTOPHY) 100-3.6 UNIT-MG/ML  SOPN, Inject 40-50 Units into the skin daily., Disp: 15 mL, Rfl: 0 .  Insulin Pen Needle 32G X 6 MM MISC, 1 each by Does not apply route daily., Disp: 100 each, Rfl: 1 .  lisinopril-hydrochlorothiazide (ZESTORETIC) 10-12.5 MG tablet, Take 0.5 tablets by mouth daily., Disp: 90 tablet, Rfl: 1 .  Melatonin 10 MG TABS, Take by mouth., Disp: , Rfl:  .  meloxicam (MOBIC) 15 MG tablet, Take 1 tablet (15 mg total) by mouth daily., Disp: 30 tablet, Rfl: 0 .  metFORMIN (GLUCOPHAGE-XR) 750 MG 24 hr tablet, Take  2 tablets (1,500 mg total) by mouth daily with breakfast., Disp: 180 tablet, Rfl: 1 .  montelukast (SINGULAIR) 10 MG tablet, Take 1 tablet (10 mg total) by mouth at bedtime., Disp: 30 tablet, Rfl: 3 .  Multiple Vitamin (MULITIVITAMIN WITH MINERALS) TABS, Take 1 tablet by mouth daily.  , Disp: , Rfl:  .  ONETOUCH DELICA LANCETS FINE MISC, 1 Units by Does not apply route 2 (two) times daily., Disp: 100 each, Rfl: 2 .  pravastatin (PRAVACHOL) 20 MG tablet, Take 1 tablet (20 mg total) by mouth 3 (three) times a week., Disp: 30 tablet, Rfl: 0 .  pregabalin (LYRICA) 75 MG capsule, Take 1 capsule (75 mg total) by mouth 2 (two) times daily., Disp: 60 capsule, Rfl: 0 .  cholecalciferol (VITAMIN D) 1000 UNITS tablet, Take 1,000 Units by mouth 2 (two) times daily.  , Disp: , Rfl:  .  magnesium 30 MG tablet, Take 30 mg by mouth., Disp: , Rfl:   Allergies  Allergen Reactions  . Atorvastatin     and pravastatin caused muscle cramps, also simvastatin  . Baclofen     spasms  . Compazine Other (See Comments)    Twitching and spasms of neck muscles  . Penicillins     rash    I personally reviewed active problem list, medication list, allergies, notes from last encounter, lab results with the patient/caregiver today.  ROS  Ten systems reviewed and is negative except as mentioned in HPI  Objective  Virtual encounter, vitals not obtained.  There is no height or weight on file to calculate BMI.  Nursing Note and Vital Signs reviewed.  Physical Exam  Pulmonary/Chest: Effort normal. No respiratory distress. Speaking in complete sentences Neurological: Pt is alert and oriented to person, place, and time. Speech is normal. Psychiatric: Patient has a normal mood and affect. behavior is normal. Judgment and thought content normal.   No results found for this or any previous visit (from the past 72 hour(s)).  Assessment & Plan 1. Acute recurrent maxillary sinusitis - Finish Doxycycline today; test  for COVID-19 due to workplace potential exposure and ongoing symptoms, this will also allow her to be seen by ENT who advised her they cannot see her in person without a negative test.  Take Zyrtec daily, NSAID PRN, flonase daily, pred taper, and mucinex.  Recommend caution with blood sugars during prednisone - monitor more frequently. - guaiFENesin (MUCINEX) 600 MG 12 hr tablet; Take 1 tablet (600 mg total) by mouth 2 (two) times daily.  Dispense: 20 tablet; Refill: 0 - predniSONE (DELTASONE) 10 MG tablet; Day1:5tabs, Day2:4tabs, Day3:3tabs, Day4:2tabs, Day5:1tab  Dispense: 15 tablet; Refill: 0 - Novel Coronavirus, NAA ([facility])  2. Wheezing - Novel Coronavirus, NAA ([facility])  -Red flags and when to present for emergency care or RTC including fever >101.41F, chest pain, shortness of breath, new/worsening/un-resolving symptoms, reviewed with patient at time of visit. Follow up and care  instructions discussed and provided in AVS. - I discussed the assessment and treatment plan with the patient. The patient was provided an opportunity to ask questions and all were answered. The patient agreed with the plan and demonstrated an understanding of the instructions.  - The patient was advised to call back or seek an in-person evaluation if the symptoms worsen or if the condition fails to improve as anticipated.  I provided 11 minutes of non-face-to-face time during this encounter.  Hubbard Hartshorn, FNP

## 2019-12-13 ENCOUNTER — Other Ambulatory Visit: Payer: Self-pay

## 2019-12-13 DIAGNOSIS — F411 Generalized anxiety disorder: Secondary | ICD-10-CM

## 2019-12-19 ENCOUNTER — Ambulatory Visit: Payer: BC Managed Care – PPO | Attending: Internal Medicine

## 2019-12-19 DIAGNOSIS — Z20828 Contact with and (suspected) exposure to other viral communicable diseases: Secondary | ICD-10-CM | POA: Diagnosis not present

## 2019-12-19 DIAGNOSIS — Z20822 Contact with and (suspected) exposure to covid-19: Secondary | ICD-10-CM

## 2019-12-21 ENCOUNTER — Encounter: Payer: Self-pay | Admitting: Family Medicine

## 2019-12-21 DIAGNOSIS — T162XXA Foreign body in left ear, initial encounter: Secondary | ICD-10-CM | POA: Diagnosis not present

## 2019-12-21 DIAGNOSIS — J019 Acute sinusitis, unspecified: Secondary | ICD-10-CM | POA: Diagnosis not present

## 2019-12-21 DIAGNOSIS — F064 Anxiety disorder due to known physiological condition: Secondary | ICD-10-CM | POA: Diagnosis not present

## 2019-12-21 DIAGNOSIS — T161XXA Foreign body in right ear, initial encounter: Secondary | ICD-10-CM | POA: Diagnosis not present

## 2019-12-21 LAB — NOVEL CORONAVIRUS, NAA: SARS-CoV-2, NAA: NOT DETECTED

## 2019-12-22 ENCOUNTER — Other Ambulatory Visit: Payer: Self-pay | Admitting: Family Medicine

## 2019-12-22 DIAGNOSIS — F411 Generalized anxiety disorder: Secondary | ICD-10-CM

## 2019-12-22 NOTE — Telephone Encounter (Signed)
Requested medication (s) are due for refill today:  yes  Requested medication (s) are on the active medication list: yes  Last refill:  09/28/2019  Future visit scheduled: yes  Notes to clinic:  This refill cannot be delegated    Requested Prescriptions  Pending Prescriptions Disp Refills   ALPRAZolam (ALPRAZOLAM XR) 0.5 MG 24 hr tablet 30 tablet 2    Sig: Take 1 tablet (0.5 mg total) by mouth daily.      Not Delegated - Psychiatry:  Anxiolytics/Hypnotics Failed - 12/22/2019  2:55 PM      Failed - This refill cannot be delegated      Passed - Urine Drug Screen completed in last 360 days.      Passed - Valid encounter within last 6 months    Recent Outpatient Visits           1 month ago Acute recurrent maxillary sinusitis   [place], FNP   1 month ago Acute recurrent maxillary sinusitis   [facility] Delsa Grana, PA-C   2 months ago Diabetic peripheral neuropathy associated with type 2 diabetes mellitus [facility])   [facility] Steele Sizer, MD   6 months ago Diabetic peripheral neuropathy associated with type 2 diabetes mellitus [facility])   [facility] Steele Sizer, MD   8 months ago Diabetic peripheral neuropathy associated with type 2 diabetes mellitus [facility])   [facility] Steele Sizer, MD       Future Appointments             In 2 weeks Steele Sizer, MD [facility], [facility]

## 2019-12-22 NOTE — Telephone Encounter (Signed)
Medication: ALPRAZolam (ALPRAZOLAM XR) 0.5 MG 24 hr tablet [id number] Patient has rescheduled appt to 01/11/20  Has the patient contacted their pharmacy? Yes  (Agent: If no, request that the patient contact the pharmacy for the refill.) (Agent: If yes, when and what did the pharmacy advise?)  Preferred Pharmacy (with phone number or street name): [place], [place]  Phone:  [phone] Fax:  [phone]     Agent: Please be advised that RX refills may take up to 3 business days. We ask that you follow-up with your pharmacy.

## 2019-12-23 ENCOUNTER — Encounter: Payer: Self-pay | Admitting: Emergency Medicine

## 2019-12-23 ENCOUNTER — Other Ambulatory Visit: Payer: Self-pay

## 2019-12-23 ENCOUNTER — Emergency Department
Admission: EM | Admit: 2019-12-23 | Discharge: 2019-12-23 | Disposition: A | Discharge status: Home / Self Care | Payer: BC Managed Care – PPO | Attending: Emergency Medicine | Admitting: Emergency Medicine

## 2019-12-23 ENCOUNTER — Emergency Department: Payer: BC Managed Care – PPO

## 2019-12-23 DIAGNOSIS — I10 Essential (primary) hypertension: Secondary | ICD-10-CM | POA: Diagnosis not present

## 2019-12-23 DIAGNOSIS — M25512 Pain in left shoulder: Secondary | ICD-10-CM | POA: Diagnosis not present

## 2019-12-23 DIAGNOSIS — Z7984 Long term (current) use of oral hypoglycemic drugs: Secondary | ICD-10-CM | POA: Diagnosis not present

## 2019-12-23 DIAGNOSIS — E119 Type 2 diabetes mellitus without complications: Secondary | ICD-10-CM | POA: Diagnosis not present

## 2019-12-23 DIAGNOSIS — Z7982 Long term (current) use of aspirin: Secondary | ICD-10-CM | POA: Diagnosis not present

## 2019-12-23 DIAGNOSIS — Z79899 Other long term (current) drug therapy: Secondary | ICD-10-CM | POA: Insufficient documentation

## 2019-12-23 DIAGNOSIS — M5412 Radiculopathy, cervical region: Secondary | ICD-10-CM | POA: Diagnosis not present

## 2019-12-23 DIAGNOSIS — M50322 Other cervical disc degeneration at C5-C6 level: Secondary | ICD-10-CM | POA: Diagnosis not present

## 2019-12-23 LAB — COMPREHENSIVE METABOLIC PANEL
ALT: 22 U/L (ref 0–44)
AST: 27 U/L (ref 15–41)
Albumin: 3.5 g/dL (ref 3.5–5.0)
Alkaline Phosphatase: 86 U/L (ref 38–126)
Anion gap: 10 (ref 5–15)
BUN: 16 mg/dL (ref 6–20)
CO2: 28 mmol/L (ref 22–32)
Calcium: 9 mg/dL (ref 8.9–10.3)
Chloride: 101 mmol/L (ref 98–111)
Creatinine, Ser: 0.86 mg/dL (ref 0.44–1.00)
GFR calc Af Amer: >60 mL/min (ref >60)
GFR calc non Af Amer: >60 mL/min (ref >60)
Glucose, Bld: 201 mg/dL — ABNORMAL HIGH (ref 70–99)
Potassium: 3.5 mmol/L (ref 3.5–5.1)
Sodium: 139 mmol/L (ref 135–145)
Total Bilirubin: 0.5 mg/dL (ref 0.3–1.2)
Total Protein: 7.2 g/dL (ref 6.5–8.1)

## 2019-12-23 LAB — CBC WITH DIFFERENTIAL/PLATELET
Abs Immature Granulocytes: 0.02 10*3/uL (ref 0.00–0.07)
Basophils Absolute: 0.1 10*3/uL (ref 0.0–0.1)
Basophils Relative: 1 %
Eosinophils Absolute: 1.1 10*3/uL — ABNORMAL HIGH (ref 0.0–0.5)
Eosinophils Relative: 11 %
HCT: 40.2 % (ref 36.0–46.0)
Hemoglobin: 12.5 g/dL (ref 12.0–15.0)
Immature Granulocytes: 0 %
Lymphocytes Relative: 31 %
Lymphs Abs: 2.9 10*3/uL (ref 0.7–4.0)
MCH: 26.3 pg (ref 26.0–34.0)
MCHC: 31.1 g/dL (ref 30.0–36.0)
MCV: 84.5 fL (ref 80.0–100.0)
Monocytes Absolute: 0.7 10*3/uL (ref 0.1–1.0)
Monocytes Relative: 7 %
Neutro Abs: 4.8 10*3/uL (ref 1.7–7.7)
Neutrophils Relative %: 50 %
Platelets: 284 10*3/uL (ref 150–400)
RBC: 4.76 MIL/uL (ref 3.87–5.11)
RDW: 13.5 % (ref 11.5–15.5)
WBC: 9.5 10*3/uL (ref 4.0–10.5)
nRBC: 0 % (ref 0.0–0.2)

## 2019-12-23 LAB — TROPONIN I (HIGH SENSITIVITY)
Troponin I (High Sensitivity): 5 ng/L (ref <18)
Troponin I (High Sensitivity): 4 ng/L (ref <18)

## 2019-12-23 IMAGING — CR DG SHOULDER 2+V*L*
1 series · 3 of 3 positions shown · non-contrast
Comparison: None.

CLINICAL DATA: Left shoulder pain

EXAM:
LEFT SHOULDER - 2+ VIEW

[Series 1: dg shoulder left · 0.14mm/px · 3 of 3 slices shown]
[im 1/3]
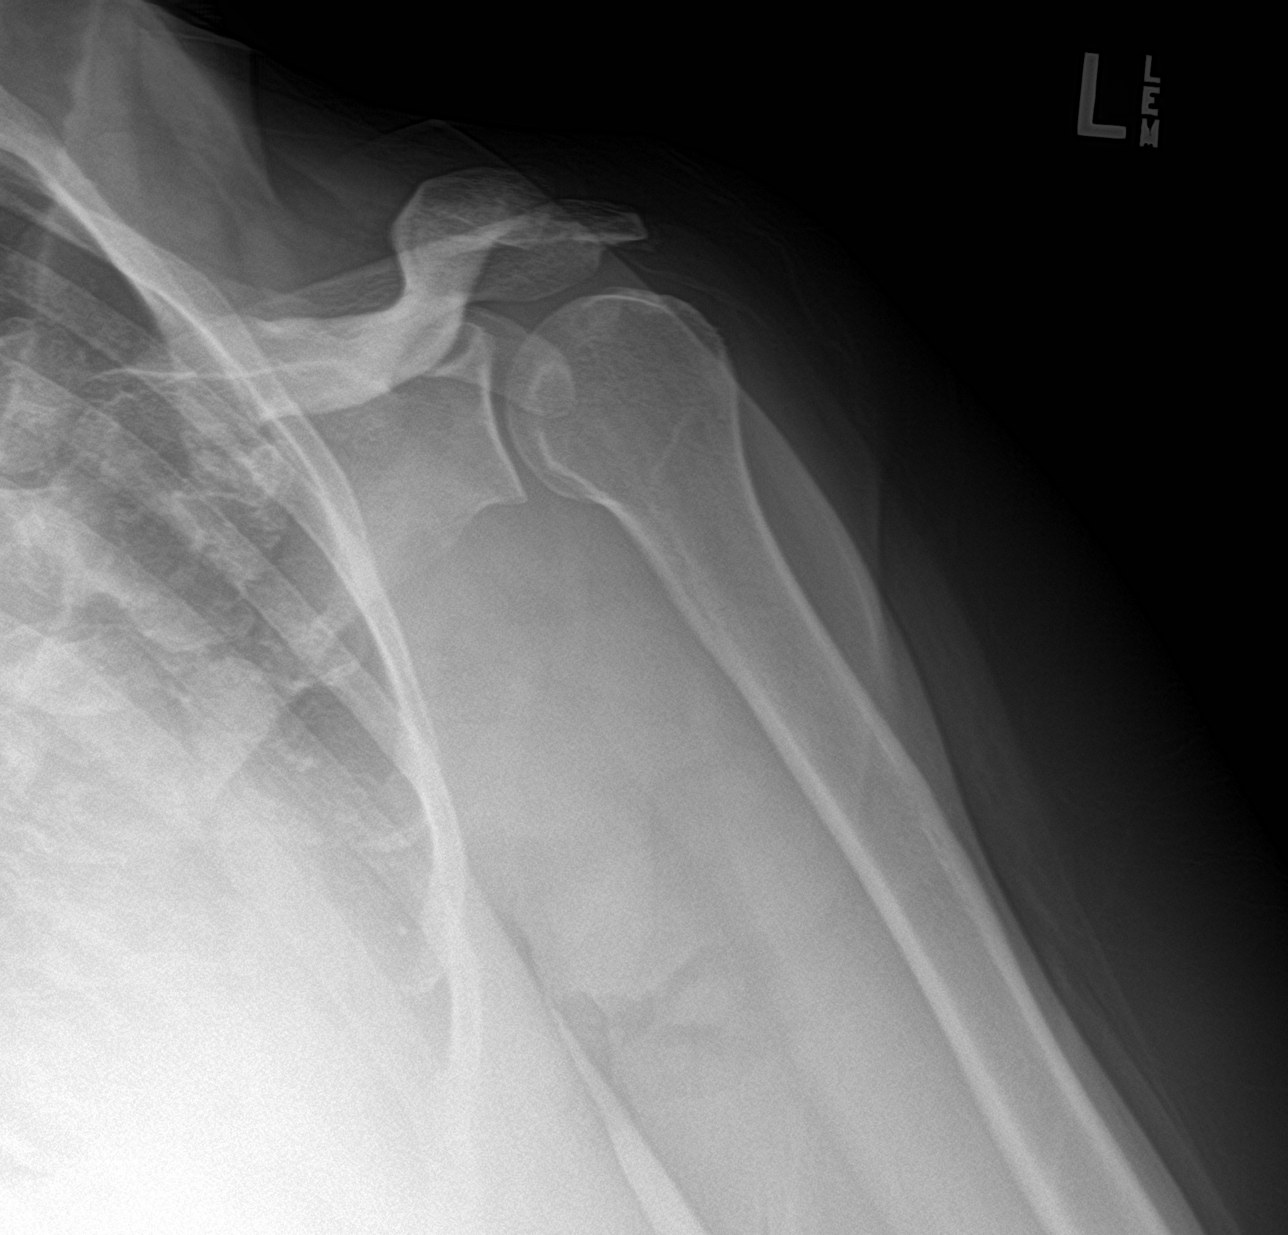
[im 2/3]
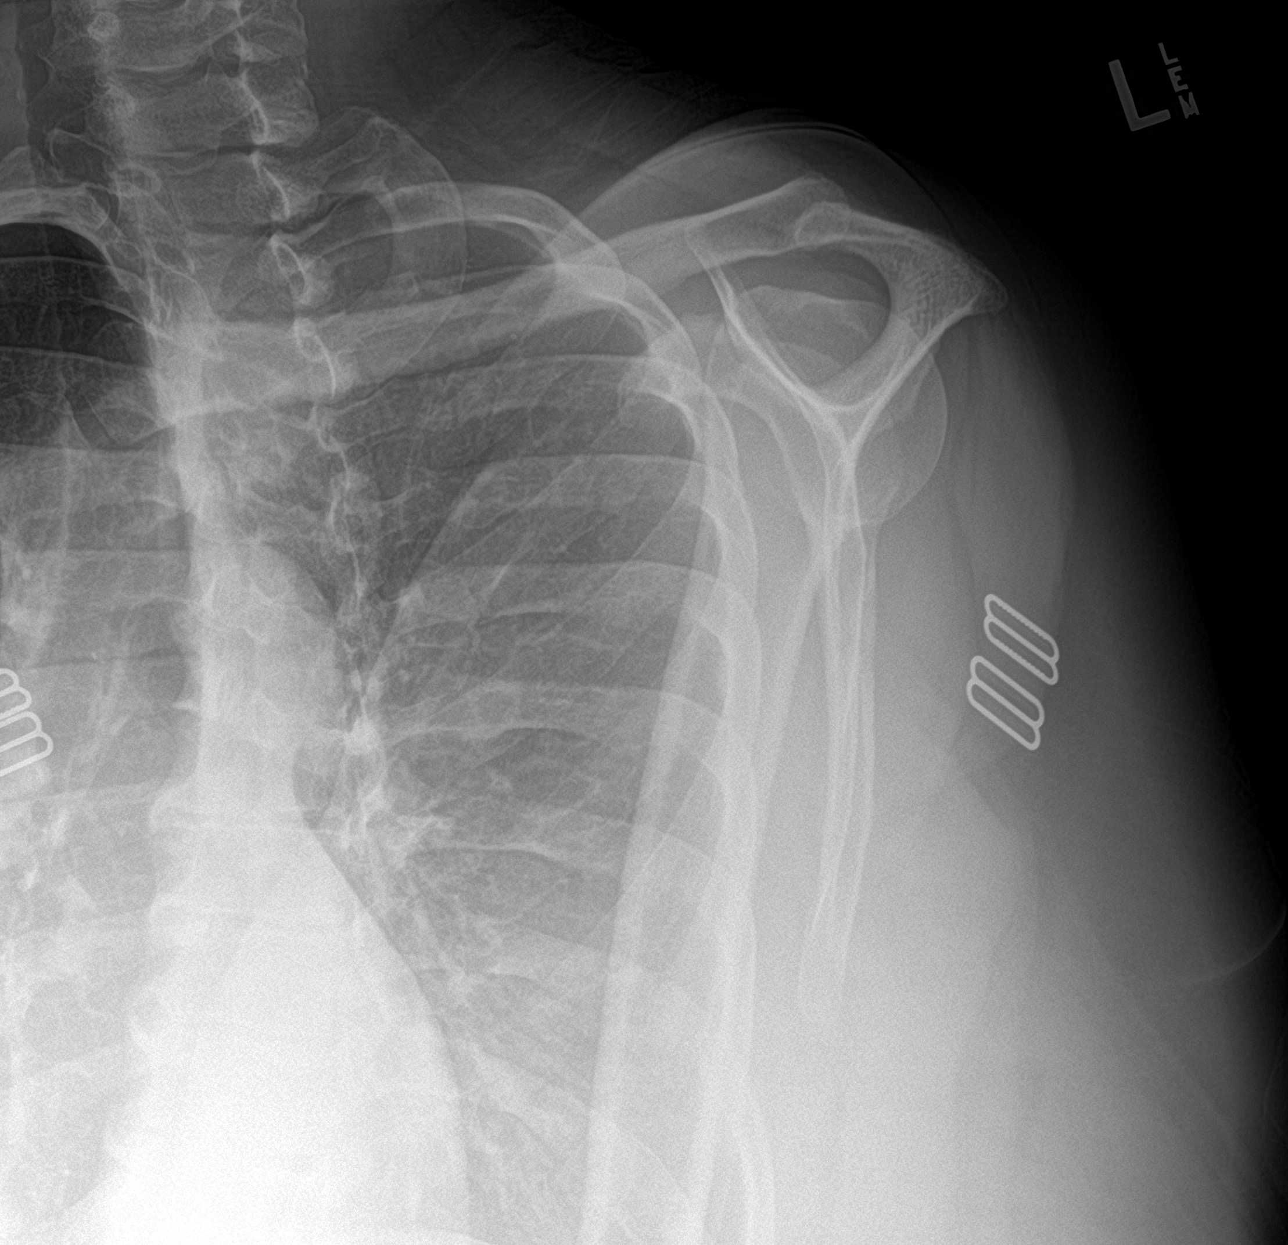
[im 3/3]
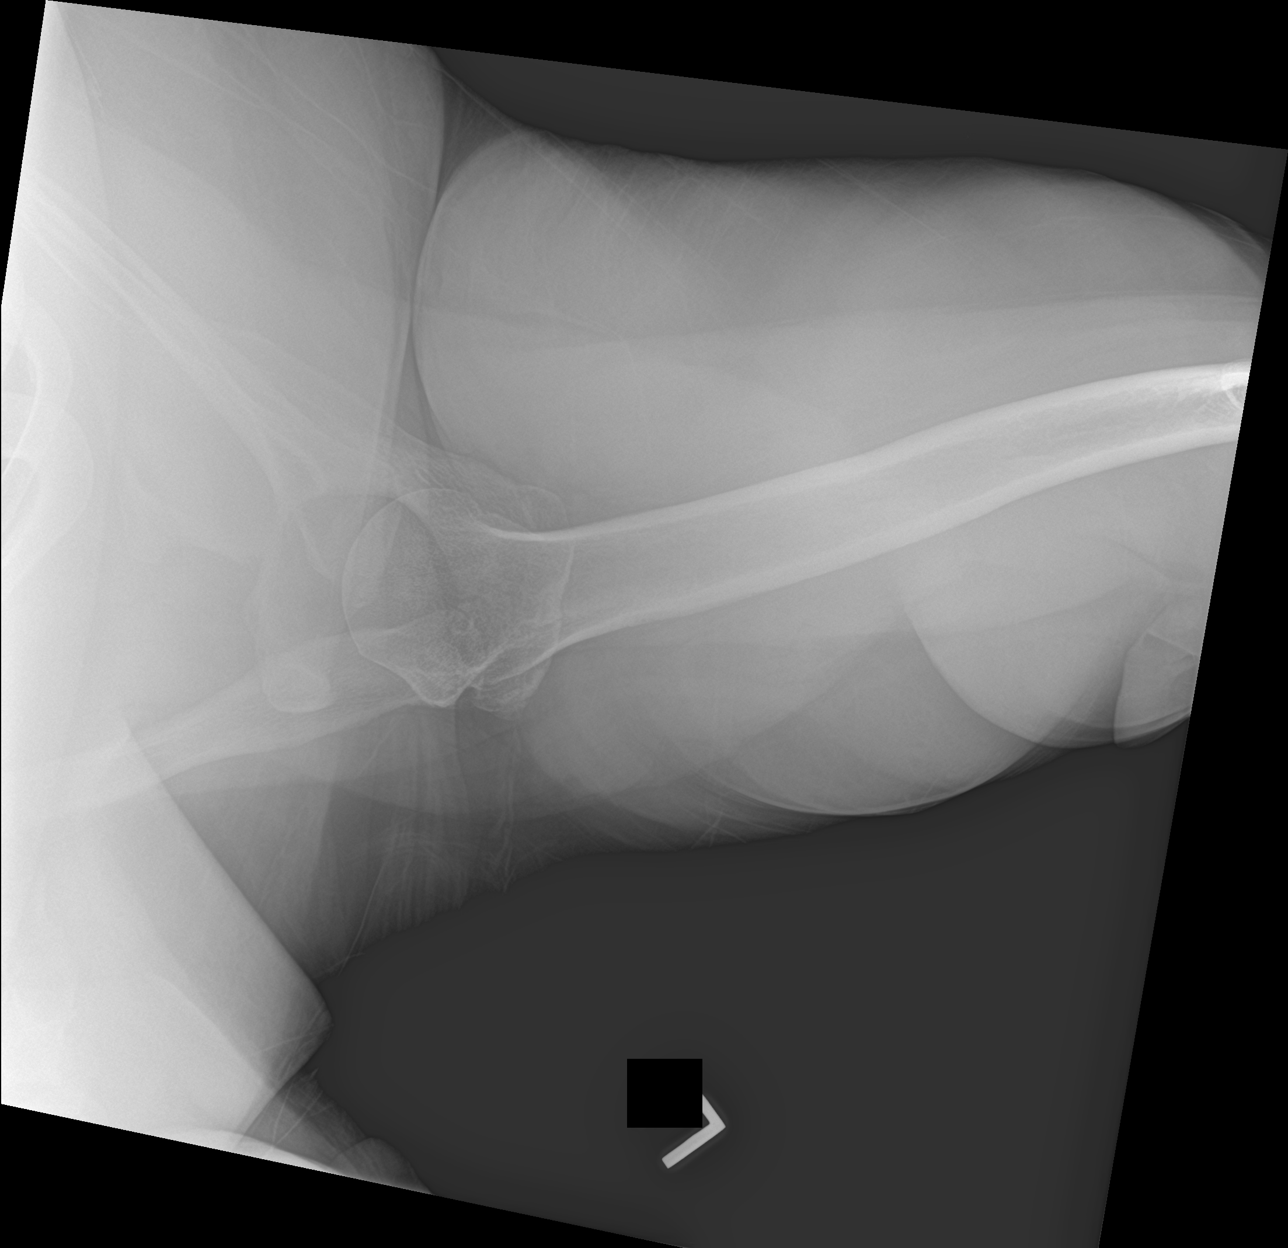

[3 of 3 positions shown; findings below may reference images not displayed]

FINDINGS: There is no evidence of fracture or dislocation. There is no
evidence of arthropathy or other focal bone abnormality. Soft
tissues are unremarkable.
IMPRESSION: Negative.

## 2019-12-23 MED ORDER — LIDOCAINE 5 % EX PTCH: 1.0000 | MEDICATED_PATCH | Freq: Two times a day (BID) | CUTANEOUS | 0 refills | Status: DC

## 2019-12-23 MED ORDER — METHYLPREDNISOLONE 4 MG PO TBPK: ORAL_TABLET | ORAL | 0 refills | Status: DC

## 2019-12-23 MED ORDER — PREDNISONE 20 MG PO TABS
60.0000 mg | ORAL_TABLET | Freq: Once | ORAL | Status: AC
  Administered 2019-12-23: 09:00:00 60 mg via ORAL
  Filled 2019-12-23: qty 3

## 2019-12-23 MED ORDER — LIDOCAINE 5 % EX PTCH
1.0000 | MEDICATED_PATCH | CUTANEOUS | Status: DC
  Administered 2019-12-23: 1 via TRANSDERMAL
  Filled 2019-12-23: qty 1

## 2019-12-23 NOTE — ED Notes (Signed)
Back from x-ray  Awaiting results

## 2019-12-23 NOTE — Discharge Instructions (Addendum)
Follow discharge care instructions.  Change Lidoderm patches every 12 hours.  Start tapered steroids tomorrow morning.  Follow-up with scheduled PCP.

## 2019-12-23 NOTE — ED Triage Notes (Signed)
Patient ambulatory to triage with steady gait, without difficulty or distress noted, mask in place; pt reports left shoulder pain for several wks; st pain radiates down left arm; denies any accomp symptoms, denies specific precipitating or aggravating factors

## 2019-12-23 NOTE — ED Provider Notes (Signed)
[facility] Emergency Department Provider Note   ____________________________________________   First MD Initiated Contact with Patient 12/23/19 [phone]     (approximate)  I have reviewed the triage vital signs and the nursing notes.   HISTORY  Chief Complaint Shoulder Pain    HPI Lindsay Mcdonald is a 59 y.o. female patient complain of left shoulder pain for several weeks.  Patient said pain starts left lateral neck and radiates to the left arm.  Patient denies any provocative incident for complaint.  Patient rates the pain as 4/10.  No palliative measure for complaint.         Past Medical History:  Diagnosis Date  . Anxiety   . Chronic pain of right knee   . Depression   . Diabetes mellitus   . High cholesterol   . Hypertension   . Muscle pain   . Palpitations   . Vitamin D deficiency   . Vitamin D deficiency     Patient Active Problem List   Diagnosis Date Noted  . Acute recurrent maxillary sinusitis 11/04/2019  . History of marijuana use 03/30/2019  . Fibromyalgia 12/31/2018  . Venous vascular malformations 03/27/2016  . Diabetic peripheral neuropathy associated with type 2 diabetes mellitus ([place]) 09/26/2015  . Non compliance w medication regimen 09/26/2015  . Seasonal allergic rhinitis 09/26/2015  . Muscle spasms of neck 06/19/2015  . Insomnia 06/19/2015  . Primary osteoarthritis of right knee 06/17/2015  . Depression, major, recurrent, mild ([place]) 06/17/2015  . Diabetes mellitus type 2, uncontrolled ([place]) 06/17/2015  . Dyslipidemia 06/17/2015  . Benign hypertension 06/17/2015  . Morbid obesity with BMI of 40.0-44.9, adult ([place]) 06/17/2015  . Vitamin D deficiency 06/17/2015    Past Surgical History:  Procedure Laterality Date  . BREAST REDUCTION SURGERY    . nasal endoscopic    . OOPHORECTOMY  ?   one ovary removed   . REDUCTION MAMMAPLASTY Bilateral 2000  . removal of ovary      Prior to Admission medications     Medication Sig Start Date End Date Taking? Authorizing Provider  ALPRAZolam (ALPRAZOLAM XR) 0.5 MG 24 hr tablet Take 1 tablet (0.5 mg total) by mouth daily. 09/28/19   Steele Sizer, MD  aspirin 81 MG tablet Take 81 mg by mouth daily.    [provider]  diphenhydrAMINE-APAP 25-500 & 500 MG MISC Take 1 tablet by mouth every evening. 09/28/18   Steele Sizer, MD  fluticasone (FLONASE) 50 MCG/ACT nasal spray Place 2 sprays into both nostrils daily. 11/16/19   Steele Sizer, MD  glucose blood test strip Use as instructed 08/08/19   Steele Sizer, MD  guaiFENesin (MUCINEX) 600 MG 12 hr tablet Take 1 tablet (600 mg total) by mouth 2 (two) times daily. 11/18/19   Hubbard Hartshorn, FNP  Insulin Degludec-Liraglutide (XULTOPHY) 100-3.6 UNIT-MG/ML SOPN Inject 40-50 Units into the skin daily. 11/13/19   Steele Sizer, MD  Insulin Pen Needle 32G X 6 MM MISC 1 each by Does not apply route daily. 04/01/17   Steele Sizer, MD  lidocaine (LIDODERM) 5 % Place 1 patch onto the skin every 12 (twelve) hours. Remove & Discard patch within 12 hours or as directed by MD 12/23/19 12/22/20  Sable Feil, PA-C  lisinopril-hydrochlorothiazide (ZESTORETIC) 10-12.5 MG tablet Take 0.5 tablets by mouth daily. 09/28/19   Steele Sizer, MD  Melatonin 10 MG TABS Take by mouth.    [provider]  meloxicam (MOBIC) 15 MG tablet Take 1 tablet (15  mg total) by mouth daily. 03/30/19   Steele Sizer, MD  metFORMIN (GLUCOPHAGE-XR) 750 MG 24 hr tablet Take 2 tablets (1,500 mg total) by mouth daily with breakfast. 09/28/19   Steele Sizer, MD  methylPREDNISolone (MEDROL DOSEPAK) 4 MG TBPK tablet Take Tapered dose as directed 12/23/19   Sable Feil, PA-C  montelukast (SINGULAIR) 10 MG tablet Take 1 tablet (10 mg total) by mouth at bedtime. 11/04/19   Delsa Grana, PA-C  Multiple Vitamin (MULITIVITAMIN WITH MINERALS) TABS Take 1 tablet by mouth daily.      [provider]  [facility] DELICA LANCETS FINE  MISC 1 Units by Does not apply route 2 (two) times daily. 04/01/17   Steele Sizer, MD  pravastatin (PRAVACHOL) 20 MG tablet Take 1 tablet (20 mg total) by mouth 3 (three) times a week. 08/01/19   Steele Sizer, MD  pregabalin (LYRICA) 75 MG capsule Take 1 capsule (75 mg total) by mouth 2 (two) times daily. 09/28/19   Steele Sizer, MD    Allergies Atorvastatin, Baclofen, Compazine, and Penicillins  Family History  Problem Relation Age of Onset  . Diabetes Mother   . Cancer Mother 54       Breast  . Breast cancer Mother 84  . Gout Mother   . Diabetes Daughter        Oldest Daughter  . Stroke Maternal Uncle   . Breast cancer Maternal Aunt 72  . Diabetes Father   . Kidney cancer Maternal Aunt   . Kidney disease Maternal Aunt     Social History Social History   Tobacco Use  . Smoking status: Never Smoker  . Smokeless tobacco: Never Used  Substance Use Topics  . Alcohol use: No    Alcohol/week: 0.0 standard drinks  . Drug use: No    Review of Systems  Constitutional: No fever/chills Eyes: No visual changes. ENT: No sore throat. Cardiovascular: Denies chest pain. Respiratory: Denies shortness of breath. Gastrointestinal: No abdominal pain.  No nausea, no vomiting.  No diarrhea.  No constipation. Genitourinary: Negative for dysuria. Musculoskeletal: Right shoulder pain. Skin: Negative for rash. Neurological: Negative for headaches, focal weakness or numbness. Psychiatric: Anxiety and depression.  Endocrine: Diabetes, hyperlipidemia, and hypertension.  Hematological/Lymphatic:  Allergic/Immunilogical: Atorvastatin, baclofen, Compazine, and penicillin. ____________________________________________   PHYSICAL EXAM:  VITAL SIGNS: ED Triage Vitals  Enc Vitals Group     BP 12/23/19 0203 (!) 138/99     Pulse Rate 12/23/19 0203 84     Resp 12/23/19 0203 18     Temp 12/23/19 0203 98.1 F (36.7 C)     Temp Source 12/23/19 0203 Oral     SpO2 12/23/19 0203 100 %      Weight 12/23/19 0146 285 lb (129.3 kg)     Height 12/23/19 0146 5\' 5"  (1.651 m)     Head Circumference --      Peak Flow --      Pain Score 12/23/19 0146 4     Pain Loc --      Pain Edu? --      Excl. in [place]? --    Constitutional: Alert and oriented. Well appearing and in no acute distress. Neck: No cervical spine tenderness to palpation. Cardiovascular: Normal rate, regular rhythm. Grossly normal heart sounds.  Good peripheral circulation. Respiratory: Normal respiratory effort.  No retractions. Lungs CTAB. Musculoskeletal: No gross deformity to the right shoulder.  Patient has full neck range of motion at this time.   Neurologic:  Normal speech and language.  No gross focal neurologic deficits are appreciated. No gait instability. Skin:  Skin is warm, dry and intact. No rash noted. Psychiatric: Mood and affect are normal. Speech and behavior are normal.  ____________________________________________   LABS (all labs ordered are listed, but only abnormal results are displayed)  Labs Reviewed  CBC WITH DIFFERENTIAL/PLATELET - Abnormal; Notable for the following components:      Result Value   Eosinophils Absolute 1.1 (*)    All other components within normal limits  COMPREHENSIVE METABOLIC PANEL - Abnormal; Notable for the following components:   Glucose, Bld 201 (*)    All other components within normal limits  TROPONIN I (HIGH SENSITIVITY)  TROPONIN I (HIGH SENSITIVITY)   ____________________________________________  EKG EKG read by heart station Dr. With no acute findings.  ____________________________________________  RADIOLOGY  ED MD interpretation:    Official radiology report(s): DG Cervical Spine 2-3 Views  Result Date: 12/23/2019 CLINICAL DATA:  Radicular pain to left upper extremity. EXAM: CERVICAL SPINE - 2-3 VIEW COMPARISON:  None FINDINGS: There is no evidence of cervical spine fracture or prevertebral soft tissue swelling. Alignment is normal. Degenerative  disc disease is identified at C5-6. No other significant bone abnormalities are identified. IMPRESSION: 1. No acute findings. 2. C5-6 degenerative disc disease. Electronically Signed   By: Kerby Moors M.D.   On: 12/23/2019 08:47   DG Shoulder Left  Result Date: 12/23/2019 CLINICAL DATA:  Left shoulder pain EXAM: LEFT SHOULDER - 2+ VIEW COMPARISON:  None. FINDINGS: There is no evidence of fracture or dislocation. There is no evidence of arthropathy or other focal bone abnormality. Soft tissues are unremarkable. IMPRESSION: Negative. Electronically Signed   By: Kerby Moors M.D.   On: 12/23/2019 08:48    ____________________________________________   PROCEDURES  Procedure(s) performed (including Critical Care):  Procedures   ____________________________________________   INITIAL IMPRESSION / ASSESSMENT AND PLAN / ED COURSE  As part of my medical decision making, I reviewed the following data within the [place] with right shoulder pain for several weeks.  Patient states pain starts left lateral neck and radiates to the shoulder.  Discussed x-ray findings of the patient cervical spine which is consistent with degenerative changes.  No acute findings of the left shoulder.  Physical exam is consistent with cervical radiculopathy.  Patient given discharge care instruction advised take medication as directed.  Patient advised to follow-up with PCP..          ____________________________________________   FINAL CLINICAL IMPRESSION(S) / ED DIAGNOSES  Final diagnoses:  Cervical radiculopathy     ED Discharge Orders         Ordered    methylPREDNISolone (MEDROL DOSEPAK) 4 MG TBPK tablet     12/23/19 0859    lidocaine (LIDODERM) 5 %  Every 12 hours     12/23/19 0859           Note:  This document was prepared using Dragon voice recognition software and may include unintentional dictation errors.    Sable Feil, PA-C 12/23/19  [id number]    Earleen Newport, MD 12/23/19 [phone]

## 2019-12-23 NOTE — ED Notes (Signed)
See triage note  Presents with pain to left shoulder and moves into arm  States pain started several weeks  Denies any injury

## 2019-12-26 ENCOUNTER — Encounter: Payer: Self-pay | Admitting: Family Medicine

## 2019-12-27 ENCOUNTER — Other Ambulatory Visit: Payer: Self-pay | Admitting: Family Medicine

## 2019-12-27 DIAGNOSIS — F411 Generalized anxiety disorder: Secondary | ICD-10-CM

## 2019-12-27 MED ORDER — ALPRAZOLAM ER 0.5 MG PO TB24: 0.5000 mg | ORAL_TABLET | Freq: Every day | ORAL | 0 refills | Status: DC

## 2019-12-28 ENCOUNTER — Ambulatory Visit: Payer: BC Managed Care – PPO | Admitting: Family Medicine

## 2020-01-02 ENCOUNTER — Encounter: Payer: Self-pay | Admitting: Family Medicine

## 2020-01-07 ENCOUNTER — Encounter: Payer: Self-pay | Admitting: Family Medicine

## 2020-01-09 ENCOUNTER — Ambulatory Visit: Payer: BC Managed Care – PPO | Attending: Internal Medicine

## 2020-01-09 DIAGNOSIS — Z20822 Contact with and (suspected) exposure to covid-19: Secondary | ICD-10-CM | POA: Diagnosis not present

## 2020-01-10 ENCOUNTER — Encounter: Payer: Self-pay | Admitting: Family Medicine

## 2020-01-10 LAB — NOVEL CORONAVIRUS, NAA: SARS-CoV-2, NAA: NOT DETECTED

## 2020-01-11 ENCOUNTER — Ambulatory Visit (INDEPENDENT_AMBULATORY_CARE_PROVIDER_SITE_OTHER): Payer: BC Managed Care – PPO | Admitting: Family Medicine

## 2020-01-11 ENCOUNTER — Other Ambulatory Visit: Payer: Self-pay

## 2020-01-11 ENCOUNTER — Encounter: Payer: Self-pay | Admitting: Family Medicine

## 2020-01-11 VITALS — BP 90/60 | HR 115 | Temp 96.9°F | Resp 16 | Ht 65.0 in | Wt 275.3 lb

## 2020-01-11 DIAGNOSIS — F5101 Primary insomnia: Secondary | ICD-10-CM

## 2020-01-11 DIAGNOSIS — F411 Generalized anxiety disorder: Secondary | ICD-10-CM

## 2020-01-11 DIAGNOSIS — I10 Essential (primary) hypertension: Secondary | ICD-10-CM

## 2020-01-11 DIAGNOSIS — F330 Major depressive disorder, recurrent, mild: Secondary | ICD-10-CM

## 2020-01-11 DIAGNOSIS — Z6841 Body Mass Index (BMI) 40.0 and over, adult: Secondary | ICD-10-CM

## 2020-01-11 DIAGNOSIS — E1142 Type 2 diabetes mellitus with diabetic polyneuropathy: Secondary | ICD-10-CM

## 2020-01-11 DIAGNOSIS — M5030 Other cervical disc degeneration, unspecified cervical region: Secondary | ICD-10-CM

## 2020-01-11 DIAGNOSIS — E785 Hyperlipidemia, unspecified: Secondary | ICD-10-CM

## 2020-01-11 LAB — POCT GLYCOSYLATED HEMOGLOBIN (HGB A1C): Hemoglobin A1C: 8.1 % — AB (ref 4.0–5.6)

## 2020-01-11 MED ORDER — ESCITALOPRAM OXALATE 5 MG PO TABS: 5.0000 mg | ORAL_TABLET | Freq: Every day | ORAL | 0 refills | Status: DC

## 2020-01-11 MED ORDER — XULTOPHY 100-3.6 UNIT-MG/ML ~~LOC~~ SOPN: 40.0000 [IU] | PEN_INJECTOR | Freq: Every day | SUBCUTANEOUS | 0 refills | Status: DC

## 2020-01-11 MED ORDER — ALPRAZOLAM ER 0.5 MG PO TB24: 0.5000 mg | ORAL_TABLET | Freq: Every day | ORAL | 2 refills | Status: DC

## 2020-01-11 MED ORDER — LISINOPRIL 5 MG PO TABS: 5.0000 mg | ORAL_TABLET | Freq: Every day | ORAL | 0 refills | Status: DC

## 2020-01-11 NOTE — Progress Notes (Signed)
Name: Lindsay Mcdonald   MRN: [id number]    DOB: [birth year]   Date:01/11/2020       Progress Note  Subjective  Chief Complaint  Chief Complaint  Patient presents with  . Diabetes  . Depression  . Fibromyalgia  . Dyslipidemia  . Hypertension    HPI  FMS: she has a long history of feeling aching and tenderworse on trunk and upper extremities. . Pain on shoulders, arms, stiff on her hips, no effusion of joints . Feels a little stiff when first gets up, she has some mental fogginess,she stopped Lyrica again on her own, she does not like taking medications and goggled side effects  DMII:glucose lately has been elevated due to prednisone  A1C went from 6.6 % to 7.9% to 8.1% but she was on three rounds of prednisone ( twice for sinus problem and once for radiculitis), she is still on Metformin and Xultophy 50 units daily She has neuropathy and pain feels like water flowing down her legs , eye exam is up to date . She has noticed some polydipsia lately   HTN: taking lisinopril/HCTZ without any side effects, denies chest pain,SOBpalpitationor dizziness. BP today is low with tachycardia, we will stop hctz at this time and monitor  Hyperlipidemia: she stopped taking pravastatin again, she states sometimes she has muscle pain, but it happens even when not taking medication, explained the importance of statin therapy for patients that have DM  Major Depression with anxiety: she took Zoloft in the past, states Cymbalta caused "weird feeling "but did not take long enough, she was afraid to start Lexapro because of side effects. She is also worried about depending on SSRI, but not bothered by Alprazolam XR. Explained that BZD's are controlled and she should try to start SSRI to be able to come off BZD. Shetried taking Prozac for one month Summer of 2019 however did not noticed improvement of symptoms of depression or anxiety and stopped it on her own. She is willing to try Lexapro   Pruritis:  she is using Aveno lotion, she states happens after showering and sometimes at night. 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 BMI is above 40  DDD cervical spine: she states symptoms were present for weeks, finally wen to [facility]/23/2019 for evaluation, had c- spine x-ray that showed DDD C5-6, also had a normal shoulder x-ray. Pain was sharp and shooting down left elbow, it was affecting her sleep, she was taking nsaids to control pain. EC gave her a prednisone taper and she is doing well now Discussed PT , she will call me back once she checks with her insurance   Patient Active Problem List   Diagnosis Date Noted  . History of marijuana use 03/30/2019  . Fibromyalgia 12/31/2018  . Venous vascular malformations 03/27/2016  . Diabetic peripheral neuropathy associated with type 2 diabetes mellitus ([place]) 09/26/2015  . Non compliance w medication regimen 09/26/2015  . Seasonal allergic rhinitis 09/26/2015  . Muscle spasms of neck 06/19/2015  . Insomnia 06/19/2015  . Primary osteoarthritis of right knee 06/17/2015  . Depression, major, recurrent, mild ([place]) 06/17/2015  . Diabetes mellitus type 2, uncontrolled ([place]) 06/17/2015  . Dyslipidemia 06/17/2015  . Benign hypertension  06/17/2015  . Morbid obesity with BMI of 40.0-44.9, adult ([place]) 06/17/2015  . Vitamin D deficiency 06/17/2015    Past Surgical History:  Procedure Laterality Date  . BREAST REDUCTION SURGERY    . nasal endoscopic    . OOPHORECTOMY  ?   one ovary removed   . REDUCTION MAMMAPLASTY Bilateral 2000  . removal of ovary      Family History  Problem Relation Age of Onset  . Diabetes Mother   . Cancer Mother 67       Breast  . Breast cancer  Mother 14  . Gout Mother   . Diabetes Daughter        Oldest Daughter  . Stroke Maternal Uncle   . Breast cancer Maternal Aunt 101  . Diabetes Father   . Kidney cancer Maternal Aunt   . Kidney disease Maternal Aunt      Current Outpatient Medications:  .  ALPRAZolam (ALPRAZOLAM XR) 0.5 MG 24 hr tablet, Take 1 tablet (0.5 mg total) by mouth daily., Disp: 30 tablet, Rfl: 2 .  aspirin 81 MG tablet, Take 81 mg by mouth daily., Disp: , Rfl:  .  fluticasone (FLONASE) 50 MCG/ACT nasal spray, Place 2 sprays into both nostrils daily., Disp: 16 g, Rfl: 6 .  glucose blood test strip, Use as instructed, Disp: 100 each, Rfl: 12 .  Insulin Degludec-Liraglutide (XULTOPHY) 100-3.6 UNIT-MG/ML SOPN, Inject 40-50 Units into the skin daily., Disp: 15 mL, Rfl: 0 .  Insulin Pen Needle 32G X 6 MM MISC, 1 each by Does not apply route daily., Disp: 100 each, Rfl: 1 .  lidocaine (LIDODERM) 5 %, Place 1 patch onto the skin every 12 (twelve) hours. Remove & Discard patch within 12 hours or as directed by MD, Disp: 10 patch, Rfl: 0 .  Melatonin 10 MG TABS, Take by mouth., Disp: , Rfl:  .  meloxicam (MOBIC) 15 MG tablet, Take 1 tablet (15 mg total) by mouth daily., Disp: 30 tablet, Rfl: 0 .  metFORMIN (GLUCOPHAGE-XR) 750 MG 24 hr tablet, Take 2 tablets (1,500 mg total) by mouth daily with breakfast., Disp: 180 tablet, Rfl: 1 .  Multiple Vitamin (MULITIVITAMIN WITH MINERALS) TABS, Take 1 tablet by mouth daily.  , Disp: , Rfl:  .  lisinopril (ZESTRIL) 5 MG tablet, Take 1 tablet (5 mg total) by mouth daily., Disp: 90 tablet, Rfl: 0 .  pravastatin (PRAVACHOL) 20 MG tablet, Take 1 tablet (20 mg total) by mouth 3 (three) times a week. (Patient not taking: Reported on 01/11/2020), Disp: 30 tablet, Rfl: 0  Allergies  Allergen Reactions  . Atorvastatin     and pravastatin caused muscle cramps, also simvastatin  . Baclofen     spasms  . Compazine Other (See Comments)    Twitching and spasms of neck muscles  . Penicillins      rash    I personally reviewed active problem list, medication list, allergies, family history, social history with the patient/caregiver today.   ROS  Constitutional: Negative for fever, positive for  weight change.  Respiratory: Negative for cough and shortness of breath.   Cardiovascular: Negative for chest pain or palpitations.  Gastrointestinal: Negative for abdominal pain, no bowel changes.  Musculoskeletal: Negative for gait problem or joint swelling.  Skin: Negative for rash.  Neurological: Negative for dizziness or headache.  No other specific complaints in a complete review of systems (except as listed in HPI above).  Objective  Vitals:   01/11/20 1453 01/11/20 1458  BP:  90/60 90/60  Pulse: (!) 115   Resp: 16   Temp: (!) 96.9 F (36.1 C)   TempSrc: Temporal   SpO2: 99%   Weight: 275 lb 4.8 oz (124.9 kg)   Height: 5\' 5"  (1.651 m)     Body mass index is 45.81 kg/m.  Physical Exam  Constitutional: Patient appears well-developed and well-nourished. Obese  No distress.  HEENT: head atraumatic, normocephalic, pupils equal and reactive to light, Cardiovascular: Normal rate, regular rhythm and normal heart sounds.  No murmur heard. No BLE edema. Pulmonary/Chest: Effort normal and breath sounds normal. No respiratory distress. Abdominal: Soft.  There is no tenderness. Psychiatric: Patient has a normal mood and affect. behavior is normal. Judgment and thought content normal.  Recent Results (from the past 2160 hour(s))  Novel Coronavirus, NAA ([facility])     Status: None   Collection Time: 12/19/19  8:10 AM   Specimen: Nasopharyngeal(NP) swabs in vial transport medium   NASOPHARYNGE  TESTING  Result Value Ref Range   SARS-CoV-2, NAA Not Detected Not Detected    Comment: This nucleic acid amplification test was developed and its performance characteristics determined by [employer]. Nucleic acid amplification tests include PCR and TMA. This test has not  been FDA cleared or approved. This test has been authorized by FDA under an Emergency Use Authorization (EUA). This test is only authorized for the duration of time the declaration that circumstances exist justifying the authorization of the emergency use of in vitro diagnostic tests for detection of SARS-CoV-2 virus and/or diagnosis of COVID-19 infection under section 564(b)(1) of the Act, 21 U.S.C. [id number]) (1), unless the authorization is terminated or revoked sooner. When diagnostic testing is negative, the possibility of a false negative result should be considered in the context of a patient's recent exposures and the presence of clinical signs and symptoms consistent with COVID-19. An individual without symptoms of COVID-19 and who is not shedding SARS-CoV-2 virus would  expect to have a negative (not detected) result in this assay.   CBC with Differential     Status: Abnormal   Collection Time: 12/23/19  2:10 AM  Result Value Ref Range   WBC 9.5 4.0 - 10.5 K/uL   RBC 4.76 3.87 - 5.11 MIL/uL   Hemoglobin 12.5 12.0 - 15.0 g/dL   HCT 40.2 36.0 - 46.0 %   MCV 84.5 80.0 - 100.0 fL   MCH 26.3 26.0 - 34.0 pg   MCHC 31.1 30.0 - 36.0 g/dL   RDW 13.5 11.5 - 15.5 %   Platelets 284 150 - 400 K/uL   nRBC 0.0 0.0 - 0.2 %   Neutrophils Relative % 50 %   Neutro Abs 4.8 1.7 - 7.7 K/uL   Lymphocytes Relative 31 %   Lymphs Abs 2.9 0.7 - 4.0 K/uL   Monocytes Relative 7 %   Monocytes Absolute 0.7 0.1 - 1.0 K/uL   Eosinophils Relative 11 %   Eosinophils Absolute 1.1 (H) 0.0 - 0.5 K/uL   Basophils Relative 1 %   Basophils Absolute 0.1 0.0 - 0.1 K/uL   Immature Granulocytes 0 %   Abs Immature Granulocytes 0.02 0.00 - 0.07 K/uL    Comment: Performed at [facility], [address] [place], [place] [address]  Comprehensive metabolic panel     Status: Abnormal   Collection Time: 12/23/19  2:10 AM  Result Value Ref Range   Sodium 139 135 - 145 mmol/L   Potassium 3.5 3.5 - 5.1  mmol/L  Chloride 101 98 - 111 mmol/L   CO2 28 22 - 32 mmol/L   Glucose, Bld 201 (H) 70 - 99 mg/dL   BUN 16 6 - 20 mg/dL   Creatinine, Ser 0.86 0.44 - 1.00 mg/dL   Calcium 9.0 8.9 - 10.3 mg/dL   Total Protein 7.2 6.5 - 8.1 g/dL   Albumin 3.5 3.5 - 5.0 g/dL   AST 27 15 - 41 U/L   ALT 22 0 - 44 U/L   Alkaline Phosphatase 86 38 - 126 U/L   Total Bilirubin 0.5 0.3 - 1.2 mg/dL   GFR calc non Af Amer >60 >60 mL/min   GFR calc Af Amer >60 >60 mL/min   Anion gap 10 5 - 15    Comment: Performed at [facility], [address] [place], [place] [address]  Troponin I (High Sensitivity)     Status: None   Collection Time: 12/23/19  2:10 AM  Result Value Ref Range   Troponin I (High Sensitivity) 5 <18 ng/L    Comment: (NOTE) Elevated high sensitivity troponin I (hsTnI) values and significant  changes across serial measurements may suggest ACS but many other  chronic and acute conditions are known to elevate hsTnI results.  Refer to the "Links" section for chest pain algorithms and additional  guidance. Performed at [facility], [place], [place] [address]   Troponin I (High Sensitivity)     Status: None   Collection Time: 12/23/19  4:36 AM  Result Value Ref Range   Troponin I (High Sensitivity) 4 <18 ng/L    Comment: (NOTE) Elevated high sensitivity troponin I (hsTnI) values and significant  changes across serial measurements may suggest ACS but many other  chronic and acute conditions are known to elevate hsTnI results.  Refer to the "Links" section for chest pain algorithms and additional  guidance. Performed at [facility], [address] [place], [place] [address]   Novel Coronavirus, NAA ([facility])     Status: None   Collection Time: 01/09/20  9:37 AM   Specimen: Nasopharyngeal(NP) swabs in vial transport medium   NASOPHARYNGE  TESTING  Result Value Ref Range   SARS-CoV-2, NAA Not Detected Not Detected    Comment: This nucleic  acid amplification test was developed and its performance characteristics determined by [employer]. Nucleic acid amplification tests include PCR and TMA. This test has not been FDA cleared or approved. This test has been authorized by FDA under an Emergency Use Authorization (EUA). This test is only authorized for the duration of time the declaration that circumstances exist justifying the authorization of the emergency use of in vitro diagnostic tests for detection of SARS-CoV-2 virus and/or diagnosis of COVID-19 infection under section 564(b)(1) of the Act, 21 U.S.C. [id number]) (1), unless the authorization is terminated or revoked sooner. When diagnostic testing is negative, the possibility of a false negative result should be considered in the context of a patient's recent exposures and the presence of clinical signs and symptoms consistent with COVID-19. An individual without symptoms of COVID-19 and who is not shedding SARS-CoV-2 virus would  expect to have a negative (not detected) result in this assay.   POCT HgB A1C     Status: Abnormal   Collection Time: 01/11/20  2:59 PM  Result Value Ref Range   Hemoglobin A1C 8.1 (A) 4.0 - 5.6 %   HbA1c POC (<> result, manual entry)     HbA1c, POC (prediabetic range)  HbA1c, POC (controlled diabetic range)      PHQ2/9: Depression screen [facility] 2/9 01/11/2020 11/18/2019 11/04/2019 09/28/2019 06/20/2019  Decreased Interest 1 0 0 1 1  Down, Depressed, Hopeless 1 0 1 1 1   PHQ - 2 Score 2 0 1 2 2   Altered sleeping 1 0 0 1 1  Tired, decreased energy 1 0 0 1 1  Change in appetite 1 0 0 1 2  Feeling bad or failure about yourself  1 0 0 1 1  Trouble concentrating 0 0 0 1 0  Moving slowly or fidgety/restless 1 0 0 1 1  Suicidal thoughts 0 0 0 0 0  PHQ-9 Score 7 0 1 8 8   Difficult doing work/chores Somewhat difficult Not difficult at all Not difficult at all Somewhat difficult Somewhat difficult  Some recent data might be hidden     phq 9 is negative   Fall Risk: Fall Risk  01/11/2020 11/18/2019 11/04/2019 09/28/2019 06/20/2019  Falls in the past year? 0 0 0 0 0  Number falls in past yr: 0 0 0 0 0  Injury with Fall? 0 0 0 0 0  Follow up - Falls evaluation completed - - -     Functional Status Survey: Is the patient deaf or have difficulty hearing?: No Does the patient have difficulty seeing, even when wearing glasses/contacts?: No Does the patient have difficulty concentrating, remembering, or making decisions?: No Does the patient have difficulty walking or climbing stairs?: No Does the patient have difficulty dressing or bathing?: No Does the patient have difficulty doing errands alone such as visiting a doctor's office or shopping?: No    Assessment & Plan  1. Diabetic peripheral neuropathy associated with type 2 diabetes mellitus (HCC)  - POCT HgB A1C - Insulin Degludec-Liraglutide (XULTOPHY) 100-3.6 UNIT-MG/ML SOPN; Inject 40-50 Units into the skin daily.  Dispense: 15 mL; Refill: 0 - Hemoglobin A1c  2. Depression, major, recurrent, mild ([place])  Not doing so well, sickly since Fall and is getting upset, she is willing to try Lexapro now   3. Morbid obesity with BMI of 40.0-44.9, adult [facility])  Discussed with the patient the risk posed by an increased BMI. Discussed importance of portion control, calorie counting and at least 150 minutes of physical activity weekly. Avoid sweet beverages and drink more water. Eat at least 6 servings of fruit and vegetables daily   4. Benign hypertension  - lisinopril (ZESTRIL) 5 MG tablet; Take 1 tablet (5 mg total) by mouth daily.  Dispense: 90 tablet; Refill: 0 - COMPLETE METABOLIC PANEL WITH GFR - CBC with Differential/Platelet  5. Dyslipidemia  - Lipid panel  6. Primary insomnia   7. GAD (generalized anxiety disorder)  - ALPRAZolam (ALPRAZOLAM XR) 0.5 MG 24 hr tablet; Take 1 tablet (0.5 mg total) by mouth daily.  Dispense: 30 tablet; Refill: 2  8. DDD  (degenerative disc disease), cervical  Doing better, discussed PT

## 2020-01-11 NOTE — Patient Instructions (Signed)
She will check with insurance coverage for Physical therapy

## 2020-01-12 ENCOUNTER — Other Ambulatory Visit: Payer: Self-pay

## 2020-01-12 ENCOUNTER — Encounter: Payer: Self-pay | Admitting: Emergency Medicine

## 2020-01-12 ENCOUNTER — Encounter: Payer: Self-pay | Admitting: Family Medicine

## 2020-01-12 ENCOUNTER — Emergency Department
Admission: EM | Admit: 2020-01-12 | Discharge: 2020-01-12 | Disposition: A | Discharge status: Home / Self Care | Payer: BC Managed Care – PPO | Attending: Emergency Medicine | Admitting: Emergency Medicine

## 2020-01-12 DIAGNOSIS — Z79899 Other long term (current) drug therapy: Secondary | ICD-10-CM | POA: Diagnosis not present

## 2020-01-12 DIAGNOSIS — Z794 Long term (current) use of insulin: Secondary | ICD-10-CM | POA: Insufficient documentation

## 2020-01-12 DIAGNOSIS — I10 Essential (primary) hypertension: Secondary | ICD-10-CM | POA: Diagnosis not present

## 2020-01-12 DIAGNOSIS — E1140 Type 2 diabetes mellitus with diabetic neuropathy, unspecified: Secondary | ICD-10-CM | POA: Diagnosis not present

## 2020-01-12 DIAGNOSIS — J329 Chronic sinusitis, unspecified: Secondary | ICD-10-CM | POA: Diagnosis not present

## 2020-01-12 DIAGNOSIS — E860 Dehydration: Secondary | ICD-10-CM | POA: Diagnosis not present

## 2020-01-12 DIAGNOSIS — I959 Hypotension, unspecified: Secondary | ICD-10-CM | POA: Insufficient documentation

## 2020-01-12 DIAGNOSIS — R42 Dizziness and giddiness: Secondary | ICD-10-CM | POA: Diagnosis not present

## 2020-01-12 LAB — COMPREHENSIVE METABOLIC PANEL
ALT: 43 U/L (ref 0–44)
AST: 32 U/L (ref 15–41)
Albumin: 3.4 g/dL — ABNORMAL LOW (ref 3.5–5.0)
Alkaline Phosphatase: 105 U/L (ref 38–126)
Anion gap: 10 (ref 5–15)
BUN: 21 mg/dL — ABNORMAL HIGH (ref 6–20)
CO2: 28 mmol/L (ref 22–32)
Calcium: 9 mg/dL (ref 8.9–10.3)
Chloride: 101 mmol/L (ref 98–111)
Creatinine, Ser: 1.02 mg/dL — ABNORMAL HIGH (ref 0.44–1.00)
GFR calc Af Amer: >60 mL/min (ref >60)
GFR calc non Af Amer: >60 mL/min (ref >60)
Glucose, Bld: 139 mg/dL — ABNORMAL HIGH (ref 70–99)
Potassium: 3.6 mmol/L (ref 3.5–5.1)
Sodium: 139 mmol/L (ref 135–145)
Total Bilirubin: 1.1 mg/dL (ref 0.3–1.2)
Total Protein: 7.3 g/dL (ref 6.5–8.1)

## 2020-01-12 LAB — CBC
HCT: 40.9 % (ref 36.0–46.0)
Hemoglobin: 13.1 g/dL (ref 12.0–15.0)
MCH: 26.4 pg (ref 26.0–34.0)
MCHC: 32 g/dL (ref 30.0–36.0)
MCV: 82.5 fL (ref 80.0–100.0)
Platelets: 249 10*3/uL (ref 150–400)
RBC: 4.96 MIL/uL (ref 3.87–5.11)
RDW: 13.6 % (ref 11.5–15.5)
WBC: 8.2 10*3/uL (ref 4.0–10.5)
nRBC: 0 % (ref 0.0–0.2)

## 2020-01-12 MED ORDER — SODIUM CHLORIDE 0.9 % IV BOLUS
500.0000 mL | Freq: Once | INTRAVENOUS | Status: AC
  Administered 2020-01-12: 500 mL via INTRAVENOUS

## 2020-01-12 MED ORDER — SODIUM CHLORIDE 0.9 % IV BOLUS: 1000.0000 mL | Freq: Once | INTRAVENOUS | Status: DC

## 2020-01-12 MED ORDER — ONDANSETRON HCL 4 MG/2ML IJ SOLN
4.0000 mg | Freq: Once | INTRAMUSCULAR | Status: AC
  Administered 2020-01-12: 21:00:00 4 mg via INTRAVENOUS
  Filled 2020-01-12: qty 2

## 2020-01-12 NOTE — ED Triage Notes (Signed)
Pt here with c/o fatigue, has taken "quite a few meds for sinuses and degenerative disc disease recently, past few blood pressures have been low per pt. NAD. bp 110/73 in triage. Denies CP.

## 2020-01-12 NOTE — ED Provider Notes (Signed)
[facility] Emergency Department Provider Note ____________________________________________   First MD Initiated Contact with Patient 01/12/20 2030     (approximate)  I have reviewed the triage vital signs and the nursing notes.   HISTORY  Chief Complaint Hypotension and Facial Pain  HPI Lindsay Mcdonald is a 59 y.o. female, here for evaluation of lightheadedness  Patient reports that she has been feeling lightheaded for several days now.  She attributes recent symptoms over the last couple of months to a chronic sinus infection, she has been on cephalexin for that for several days and continues to have sinus drainage.  She is seeing ear nose and throat doctor for follow-up tomorrow  She reports sinus pressure and congestion.  But over the last few days she is also been feeling lightheaded, saw her doctor yesterday and her blood pressure was low so they did have changed her blood pressure medicines around decreasing her lisinopril and stopping her hydrochlorothiazide  She has not taken her blood pressure medicine today, but endorses that when she is doing things like standing for a while such as folding laundry she will start to feel lightheaded.  She does report decreased appetite and has had a few episodes of vomiting she reports the sinus drainage will cause her to gag and vomit.  No abdominal pain.  No fevers or chills.  Tested previously for Covid just recently actually negative     Past Medical History:  Diagnosis Date  . Anxiety   . Chronic pain of right knee   . Depression   . Diabetes mellitus   . High cholesterol   . Hypertension   . Muscle pain   . Palpitations   . Vitamin D deficiency   . Vitamin D deficiency     Patient Active Problem List   Diagnosis Date Noted  . History of marijuana use 03/30/2019  . Fibromyalgia 12/31/2018  . Venous vascular malformations 03/27/2016  . Diabetic peripheral neuropathy associated with type 2  diabetes mellitus ([place]) 09/26/2015  . Non compliance w medication regimen 09/26/2015  . Seasonal allergic rhinitis 09/26/2015  . Muscle spasms of neck 06/19/2015  . Insomnia 06/19/2015  . Primary osteoarthritis of right knee 06/17/2015  . Depression, major, recurrent, mild ([place]) 06/17/2015  . Diabetes mellitus type 2, uncontrolled ([place]) 06/17/2015  . Dyslipidemia 06/17/2015  . Benign hypertension 06/17/2015  . Morbid obesity with BMI of 40.0-44.9, adult ([place]) 06/17/2015  . Vitamin D deficiency 06/17/2015    Past Surgical History:  Procedure Laterality Date  . BREAST REDUCTION SURGERY    . nasal endoscopic    . OOPHORECTOMY  ?   one ovary removed   . REDUCTION MAMMAPLASTY Bilateral 2000  . removal of ovary      Prior to Admission medications   Medication Sig Start Date End Date Taking? Authorizing Provider  ALPRAZolam (ALPRAZOLAM XR) 0.5 MG 24 hr tablet Take 1 tablet (0.5 mg total) by mouth daily. 01/11/20   Steele Sizer, MD  aspirin 81 MG tablet Take 81 mg by mouth daily.    [provider]  escitalopram (LEXAPRO) 5 MG tablet Take 1 tablet (5 mg total) by mouth daily. 01/11/20   Steele Sizer, MD  fluticasone (FLONASE) 50 MCG/ACT nasal spray Place 2 sprays into both nostrils daily. 11/16/19   Steele Sizer, MD  glucose blood test strip Use as instructed 08/08/19   Steele Sizer, MD  Insulin Degludec-Liraglutide (XULTOPHY) 100-3.6 UNIT-MG/ML SOPN Inject 40-50 Units into the skin daily. 01/11/20  Steele Sizer, MD  Insulin Pen Needle 32G X 6 MM MISC 1 each by Does not apply route daily. 04/01/17   Steele Sizer, MD  lidocaine (LIDODERM) 5 % Place 1 patch onto the skin every 12 (twelve) hours. Remove & Discard patch within 12 hours or as directed by MD 12/23/19 12/22/20  Sable Feil, PA-C  lisinopril (ZESTRIL) 5 MG tablet Take 1 tablet (5 mg total) by mouth daily. 01/11/20   Steele Sizer, MD  Melatonin 10 MG TABS Take by mouth.    [provider]    meloxicam (MOBIC) 15 MG tablet Take 1 tablet (15 mg total) by mouth daily. 03/30/19   Steele Sizer, MD  metFORMIN (GLUCOPHAGE-XR) 750 MG 24 hr tablet Take 2 tablets (1,500 mg total) by mouth daily with breakfast. 09/28/19   Steele Sizer, MD  Multiple Vitamin (MULITIVITAMIN WITH MINERALS) TABS Take 1 tablet by mouth daily.      [provider]  pravastatin (PRAVACHOL) 20 MG tablet Take 1 tablet (20 mg total) by mouth 3 (three) times a week. Patient not taking: Reported on 01/11/2020 08/01/19   Steele Sizer, MD    Allergies Atorvastatin, Baclofen, Compazine, and Penicillins  Family History  Problem Relation Age of Onset  . Diabetes Mother   . Cancer Mother 77       Breast  . Breast cancer Mother 67  . Gout Mother   . Diabetes Daughter        Oldest Daughter  . Stroke Maternal Uncle   . Breast cancer Maternal Aunt 76  . Diabetes Father   . Kidney cancer Maternal Aunt   . Kidney disease Maternal Aunt     Social History Social History   Tobacco Use  . Smoking status: Never Smoker  . Smokeless tobacco: Never Used  Substance Use Topics  . Alcohol use: No    Alcohol/week: 0.0 standard drinks  . Drug use: No    Review of Systems Constitutional: No fever/chills Eyes: No visual changes. ENT: No sore throat. Cardiovascular: Denies chest pain. Respiratory: Denies shortness of breath. Gastrointestinal: No abdominal pain.   Genitourinary: Negative for dysuria. Musculoskeletal: Negative for back pain. Skin: Negative for rash. Neurological: Negative for headaches, areas of focal weakness or numbness.    ____________________________________________   PHYSICAL EXAM:  VITAL SIGNS: ED Triage Vitals  Enc Vitals Group     BP 01/12/20 1743 110/73     Pulse Rate 01/12/20 1743 97     Resp 01/12/20 1743 16     Temp 01/12/20 1743 98.3 F (36.8 C)     Temp Source 01/12/20 1743 Oral     SpO2 01/12/20 1743 100 %     Weight 01/12/20 1744 275 lb (124.7 kg)     Height  01/12/20 1744 5\' 5"  (1.651 m)     Head Circumference --      Peak Flow --      Pain Score 01/12/20 1743 0     Pain Loc --      Pain Edu? --      Excl. in [place]? --     Constitutional: Alert and oriented. Well appearing and in no acute distress. Eyes: Mild bilateral nasal congestion Head: Atraumatic. Nose: No congestion/rhinnorhea. Mouth/Throat: Mucous membranes are slightly dry. Neck: No stridor.  Cardiovascular: Normal rate, regular rhythm. Grossly normal heart sounds.  Good peripheral circulation. Respiratory: Normal respiratory effort.  No retractions. Lungs CTAB. Gastrointestinal: Soft and nontender. No distention. Musculoskeletal: No lower extremity tenderness nor edema. Neurologic:  Normal speech and language. No gross focal neurologic deficits are appreciated.  Skin:  Skin is warm, dry and intact. No rash noted. Psychiatric: Mood and affect are normal. Speech and behavior are normal.  ____________________________________________   LABS (all labs ordered are listed, but only abnormal results are displayed)  Labs Reviewed  COMPREHENSIVE METABOLIC PANEL - Abnormal; Notable for the following components:      Result Value   Glucose, Bld 139 (*)    BUN 21 (*)    Creatinine, Ser 1.02 (*)    Albumin 3.4 (*)    All other components within normal limits  CBC   ____________________________________________  EKG  Reviewed inter by me at 2110 Heart rate 99 QRS 99 QTc 450 Normal sinus rhythm no evidence of ischemia or ectopy.  Possible mild LVH ____________________________________________  RADIOLOGY   ____________________________________________   PROCEDURES  Procedure(s) performed: None  Procedures  Critical Care performed: No  ____________________________________________   INITIAL IMPRESSION / ASSESSMENT AND PLAN / ED COURSE  Pertinent labs & imaging results that were available during my care of the patient were reviewed by me and considered in my medical  decision making (see chart for details).   Patient presents for evaluation of lightheadedness, also having some hypotension.  Slightly low blood pressure in the ER, but no major hypotension, she does have a history of high blood pressure however her blood pressure 110/70  Evaluation orthostatic she does drop some, and she has been having some symptoms of orthostatic she noticed that her lightheadedness seems to be worse when she is up standing doing things such as folding close.  Overall reassuring clinical assessment, I suspect her sinusitis for which she is being evaluated closely for ENT and on antibiotic is likely driving some element of decreased oral intake and mild dehydration this combined with her blood pressure medicine is likely driving her symptoms.  Her lab work, EKG is quite reassuring.  After receiving first 500 fluid bolus of fluid she reports she is already starting to feel better    ----------------------------------------- 9:35 PM on 01/12/2020 -----------------------------------------  Patient reports feeling improvement after 500 mL bolus.  Seems to have some mild orthostatic hypotension, doing well after initial bolus.  Provide additional 500 mL bolus, plan to discharge patient thereafter with recommendation to follow-up with ENT tomorrow as well as reach back out for follow-up with her primary care physician.  Covid -3 days ago   ----------------------------------------- 9:52 PM on 01/12/2020 -----------------------------------------  Patient awake alert eating, reports she feels better.  Comfortable plan for discharge following up with ENT tomorrow.  Will prescribe as needed Zofran should she need it, discussed hydration, holding her lisinopril at least I would recommend for the next couple of days and checking blood pressures at home potentially reinitiating her lisinopril if her blood pressures start to become higher over 140.  She will check in closely with her doctor as  well.  Appears appropriate for ongoing outpatient work-up, awake alert no acute distress.  No acute cardiac neurologic vascular abdominal symptoms.  Has close follow-up with ENT already plan ____________________________________________   FINAL CLINICAL IMPRESSION(S) / ED DIAGNOSES  Final diagnoses:  Mild dehydration  Chronic sinusitis, unspecified location        Note:  This document was prepared using Dragon voice recognition software and may include unintentional dictation errors       Delman Kitten, MD 01/12/20 2154

## 2020-01-13 DIAGNOSIS — B977 Papillomavirus as the cause of diseases classified elsewhere: Secondary | ICD-10-CM | POA: Diagnosis not present

## 2020-01-13 DIAGNOSIS — J323 Chronic sphenoidal sinusitis: Secondary | ICD-10-CM | POA: Diagnosis not present

## 2020-01-13 DIAGNOSIS — J338 Other polyp of sinus: Secondary | ICD-10-CM | POA: Diagnosis not present

## 2020-01-13 DIAGNOSIS — J301 Allergic rhinitis due to pollen: Secondary | ICD-10-CM | POA: Diagnosis not present

## 2020-01-14 ENCOUNTER — Encounter: Payer: Self-pay | Admitting: Family Medicine

## 2020-01-17 ENCOUNTER — Encounter: Payer: Self-pay | Admitting: Family Medicine

## 2020-01-18 DIAGNOSIS — J3489 Other specified disorders of nose and nasal sinuses: Secondary | ICD-10-CM | POA: Diagnosis not present

## 2020-01-18 DIAGNOSIS — K219 Gastro-esophageal reflux disease without esophagitis: Secondary | ICD-10-CM | POA: Diagnosis not present

## 2020-01-18 DIAGNOSIS — J301 Allergic rhinitis due to pollen: Secondary | ICD-10-CM | POA: Diagnosis not present

## 2020-01-18 DIAGNOSIS — J329 Chronic sinusitis, unspecified: Secondary | ICD-10-CM | POA: Diagnosis not present

## 2020-01-18 DIAGNOSIS — J330 Polyp of nasal cavity: Secondary | ICD-10-CM | POA: Diagnosis not present

## 2020-01-19 ENCOUNTER — Encounter: Payer: Self-pay | Admitting: Family Medicine

## 2020-01-19 NOTE — Telephone Encounter (Signed)
Called patient to advise if symptoms are worsening she should report to the ED immediately or call 911. She did not answer. I will send her a mychart message with this information as well.

## 2020-01-20 ENCOUNTER — Other Ambulatory Visit: Payer: Self-pay

## 2020-01-20 DIAGNOSIS — Z79899 Other long term (current) drug therapy: Secondary | ICD-10-CM | POA: Insufficient documentation

## 2020-01-20 DIAGNOSIS — L509 Urticaria, unspecified: Secondary | ICD-10-CM | POA: Diagnosis not present

## 2020-01-20 DIAGNOSIS — E119 Type 2 diabetes mellitus without complications: Secondary | ICD-10-CM | POA: Insufficient documentation

## 2020-01-20 DIAGNOSIS — Z7982 Long term (current) use of aspirin: Secondary | ICD-10-CM | POA: Diagnosis not present

## 2020-01-20 DIAGNOSIS — Z794 Long term (current) use of insulin: Secondary | ICD-10-CM | POA: Insufficient documentation

## 2020-01-20 DIAGNOSIS — I10 Essential (primary) hypertension: Secondary | ICD-10-CM | POA: Insufficient documentation

## 2020-01-20 NOTE — Telephone Encounter (Signed)
Left message for Lindsay Mcdonald with [facility] to inform her we did receive patient office notes from them.

## 2020-01-20 NOTE — Telephone Encounter (Signed)
Copied from [place] [phone]. Topic: General - Other >> Jan 16, 2020 10:06 AM Celene Kras wrote: Reason for CRM: [facility] called and is requesting to have confirmation that office notes were received for the pt. Please advise. >> Jan 19, 2020  1:51 PM Jodie Echevaria wrote: Aldona Bar with [facility] called to confirm receipt of notes that was faxed over on 01/13/20 and again on 01/16/20 Asking for a call back please with an answer if they have been received or not. Ph# [phone]

## 2020-01-21 ENCOUNTER — Emergency Department (HOSPITAL_COMMUNITY)
Admission: EM | Admit: 2020-01-21 | Discharge: 2020-01-21 | Disposition: A | Discharge status: Home / Self Care | Payer: BC Managed Care – PPO | Attending: Emergency Medicine | Admitting: Emergency Medicine

## 2020-01-21 ENCOUNTER — Encounter (HOSPITAL_COMMUNITY): Payer: Self-pay | Admitting: *Deleted

## 2020-01-21 DIAGNOSIS — L509 Urticaria, unspecified: Secondary | ICD-10-CM

## 2020-01-21 MED ORDER — PREDNISONE 20 MG PO TABS
60.0000 mg | ORAL_TABLET | Freq: Once | ORAL | Status: AC
  Administered 2020-01-21: 02:00:00 60 mg via ORAL
  Filled 2020-01-21: qty 3

## 2020-01-21 NOTE — ED Triage Notes (Signed)
Pt reporting that she has had hives on her arms, legs, chest for several days, taking zyrtec for the same without relief. Tonight, feels like her ankles are swollen.

## 2020-01-21 NOTE — Discharge Instructions (Addendum)
I recommend you begin your steroid taper tomorrow.  You may continue Zyrtec every morning and Benadryl 50 mg every 8 hours as needed for hives, itching.

## 2020-01-21 NOTE — ED Notes (Signed)
Pt verbalized understanding of d/c instructions, follow up care and s/s requiring return to ED. Pt had no additional questions at this time.

## 2020-01-21 NOTE — ED Provider Notes (Addendum)
TIME SEEN: 1:33 AM  CHIEF COMPLAINT: Urticaria  HPI: Patient is a 59 year old female with history of hypertension, diabetes, hyperlipidemia who presents to the emergency department with scattered urticaria for the past several days.  Has been on multiple recent antibiotics and steroids for sinusitis and a nasal polyp.  States these medications are prescribed by her ENT physician.  Just recently finished a long course of Keflex.  No other new medications, soaps, lotions, detergents or foods.  No lip or tongue swelling.  States she did feel slightly short of breath tonight but this has resolved.  No chest pain.  Noticed that she has had some swelling in both of her ankles.  No calf pain or calf tenderness.  No DVT or history of PE.  ROS: See HPI Constitutional: no fever  Eyes: no drainage  ENT: no runny nose   Cardiovascular:  no chest pain  Resp: no SOB  GI: no vomiting GU: no dysuria Integumentary: no rash  Allergy:  hives  Musculoskeletal: no leg swelling  Neurological: no slurred speech ROS otherwise negative  PAST MEDICAL HISTORY/PAST SURGICAL HISTORY:  Past Medical History:  Diagnosis Date  . Anxiety   . Chronic pain of right knee   . Depression   . Diabetes mellitus   . High cholesterol   . Hypertension   . Muscle pain   . Palpitations   . Vitamin D deficiency   . Vitamin D deficiency     MEDICATIONS:  Prior to Admission medications   Medication Sig Start Date End Date Taking? Authorizing Provider  ALPRAZolam (ALPRAZOLAM XR) 0.5 MG 24 hr tablet Take 1 tablet (0.5 mg total) by mouth daily. 01/11/20   Steele Sizer, MD  aspirin 81 MG tablet Take 81 mg by mouth daily.    [provider]  escitalopram (LEXAPRO) 5 MG tablet Take 1 tablet (5 mg total) by mouth daily. 01/11/20   Steele Sizer, MD  fluticasone (FLONASE) 50 MCG/ACT nasal spray Place 2 sprays into both nostrils daily. 11/16/19   Steele Sizer, MD  glucose blood test strip Use as instructed 08/08/19    Steele Sizer, MD  Insulin Degludec-Liraglutide (XULTOPHY) 100-3.6 UNIT-MG/ML SOPN Inject 40-50 Units into the skin daily. 01/11/20   Steele Sizer, MD  Insulin Pen Needle 32G X 6 MM MISC 1 each by Does not apply route daily. 04/01/17   Steele Sizer, MD  lidocaine (LIDODERM) 5 % Place 1 patch onto the skin every 12 (twelve) hours. Remove & Discard patch within 12 hours or as directed by MD 12/23/19 12/22/20  Sable Feil, PA-C  lisinopril (ZESTRIL) 5 MG tablet Take 1 tablet (5 mg total) by mouth daily. 01/11/20   Steele Sizer, MD  Melatonin 10 MG TABS Take by mouth.    [provider]  meloxicam (MOBIC) 15 MG tablet Take 1 tablet (15 mg total) by mouth daily. 03/30/19   Steele Sizer, MD  metFORMIN (GLUCOPHAGE-XR) 750 MG 24 hr tablet Take 2 tablets (1,500 mg total) by mouth daily with breakfast. 09/28/19   Steele Sizer, MD  Multiple Vitamin (MULITIVITAMIN WITH MINERALS) TABS Take 1 tablet by mouth daily.      [provider]  pravastatin (PRAVACHOL) 20 MG tablet Take 1 tablet (20 mg total) by mouth 3 (three) times a week. Patient not taking: Reported on 01/11/2020 08/01/19   Steele Sizer, MD    ALLERGIES:  Allergies  Allergen Reactions  . Atorvastatin     and pravastatin caused muscle cramps, also simvastatin  .  Baclofen     spasms  . Compazine Other (See Comments)    Twitching and spasms of neck muscles  . Penicillins     rash    SOCIAL HISTORY:  Social History   Tobacco Use  . Smoking status: Never Smoker  . Smokeless tobacco: Never Used  Substance Use Topics  . Alcohol use: No    Alcohol/week: 0.0 standard drinks    FAMILY HISTORY: Family History  Problem Relation Age of Onset  . Diabetes Mother   . Cancer Mother 16       Breast  . Breast cancer Mother 42  . Gout Mother   . Diabetes Daughter        Oldest Daughter  . Stroke Maternal Uncle   . Breast cancer Maternal Aunt 57  . Diabetes Father   . Kidney cancer Maternal Aunt   . Kidney  disease Maternal Aunt     EXAM: BP (!) 199/100 (BP Location: Left Arm)   Pulse 99   Temp 98.1 F (36.7 C) (Oral)   Resp 20   Ht 5\' 5"  (1.651 m)   Wt 124.7 kg   SpO2 99%   BMI 45.76 kg/m  CONSTITUTIONAL: Alert and oriented and responds appropriately to questions. Well-appearing; well-nourished HEAD: Normocephalic EYES: Conjunctivae clear, pupils appear equal, EOM appear intact ENT: normal nose; moist mucous membranes, no lip or tongue swelling, posterior oropharynx appears normal.  Normal phonation.  No stridor. NECK: Supple, normal ROM CARD: RRR; S1 and S2 appreciated; no murmurs, no clicks, no rubs, no gallops RESP: Normal chest excursion without splinting or tachypnea; breath sounds clear and equal bilaterally; no wheezes, no rhonchi, no rales, no hypoxia or respiratory distress, speaking full sentences ABD/GI: Normal bowel sounds; non-distended; soft, non-tender, no rebound, no guarding, no peritoneal signs, no hepatosplenomegaly BACK:  The back appears normal EXT: Normal ROM in all joints; no deformity noted, only trace edema in bilateral ankles that is equal and nonpitting bilaterally; no cyanosis, large area of urticaria to the left anterior upper thigh, 2+ DP pulses bilaterally no calf tenderness or calf swelling. SKIN: Normal color for age and race; warm; tattered urticaria, no blisters or desquamation, no redness or warmth, no induration, no petechiae or purpura NEURO: Moves all extremities equally PSYCH: The patient's mood and manner are appropriate.   MEDICAL DECISION MAKING: Patient here with scattered urticaria.  Could be from recent antibiotic use.  I recommended allergy specialist follow-up.  No lip or tongue swelling.  States she took Advil PM prior to arrival and symptoms already significantly improving.  She is using Zyrtec daily as well.  Recommended continuing Zyrtec and Benadryl.  She has a steroid taper at home that she has not started.  Recommend she begin this  medication tomorrow.  Will give dose of prednisone here.  No sign of life-threatening illness today.  Given symptoms are improving, I feel she is safe to be discharged home.  She also seems very concerned out the swelling in her ankles.  It is very mild, trace at best and nonpitting.  It is equal bilaterally.  She has strong pulses.  No concern for arterial obstruction.  Doubt DVT.  She has no shortness of breath or signs of volume overload otherwise.  No signs of cellulitis, abscess, gout, septic arthritis.  Recommended elevation of her lower extremities, compression stockings as needed and PCP follow-up.  At this time, I do not feel there is any life-threatening condition present. I have reviewed, interpreted and discussed all  results (EKG, imaging, lab, urine as appropriate) and exam findings with patient/family. I have reviewed nursing notes and appropriate previous records.  I feel the patient is safe to be discharged home without further emergent workup and can continue workup as an outpatient as needed. Discussed usual and customary return precautions. Patient/family verbalize understanding and are comfortable with this plan.  Outpatient follow-up has been provided as needed. All questions have been answered.    Rebcca Knipfer was evaluated in Emergency Department on 01/21/2020 for the symptoms described in the history of present illness. She was evaluated in the context of the global COVID-19 pandemic, which necessitated consideration that the patient might be at risk for infection with the SARS-CoV-2 virus that causes COVID-19. Institutional protocols and algorithms that pertain to the evaluation of patients at risk for COVID-19 are in a state of rapid change based on information released by regulatory bodies including the CDC and federal and state organizations. These policies and algorithms were followed during the patient's care in the ED.  Patient was seen wearing N95, face shield, gloves.     Jeyren Danowski, Delice Bison, DO 01/21/20 [place], Delice Bison, DO 01/21/20 [phone]

## 2020-01-27 DIAGNOSIS — J301 Allergic rhinitis due to pollen: Secondary | ICD-10-CM | POA: Diagnosis not present

## 2020-01-29 ENCOUNTER — Encounter: Payer: Self-pay | Admitting: Family Medicine

## 2020-02-01 ENCOUNTER — Ambulatory Visit (INDEPENDENT_AMBULATORY_CARE_PROVIDER_SITE_OTHER): Payer: BC Managed Care – PPO | Admitting: Family Medicine

## 2020-02-01 ENCOUNTER — Encounter: Payer: Self-pay | Admitting: Family Medicine

## 2020-02-01 ENCOUNTER — Telehealth: Payer: Self-pay

## 2020-02-01 ENCOUNTER — Other Ambulatory Visit: Payer: Self-pay

## 2020-02-01 VITALS — BP 130/72 | HR 100 | Temp 97.5°F | Resp 16 | Ht 65.0 in | Wt 282.4 lb

## 2020-02-01 DIAGNOSIS — L509 Urticaria, unspecified: Secondary | ICD-10-CM

## 2020-02-01 DIAGNOSIS — I10 Essential (primary) hypertension: Secondary | ICD-10-CM

## 2020-02-01 DIAGNOSIS — R600 Localized edema: Secondary | ICD-10-CM

## 2020-02-01 NOTE — Progress Notes (Signed)
Name: Lindsay Mcdonald   MRN: [id number]    DOB: 02/05/1961   Date:02/01/2020       Progress Note  Subjective  Chief Complaint  Chief Complaint  Patient presents with  . ER Follow up    3 times sin1/1/21, intense itching, ankle swelling  . Hypertension    discuss medication BP running high    HPI  EC visit: she went on 01/21/2020 because she noticed some swelling on her legs and she was worried , she also had some hives. She had some SOB the night before, but no chest pain. She was sent home prednisone taper for hives but she stopped because it was keeping her up at nights.   Hives: seeing ENT, taking Zyrtec and had allergy testing ( blood work ) last week, Dr. Ladene Artist is her provider. Not associated with SOB or wheezing, no angioedema. She states symptoms started when she moved to an apartment, advised to check if they had pets in the unit that she moved in  HTN: she is concerned because bp was very high during [facility] visit at 199/100, however today bp is at goal. It may have been from stress. Previous visit she had very low bp and we stopped HCTZ, she asked to resume it for lower extremity edema but I don't recommend it since she had to go to [facility] with symptomatic hypotension while on medication  Patient Active Problem List   Diagnosis Date Noted  . History of marijuana use 03/30/2019  . Fibromyalgia 12/31/2018  . Venous vascular malformations 03/27/2016  . Diabetic peripheral neuropathy associated with type 2 diabetes mellitus ([place]) 09/26/2015  . Non compliance w medication regimen 09/26/2015  . Seasonal allergic rhinitis 09/26/2015  . Muscle spasms of neck 06/19/2015  . Insomnia 06/19/2015  . Primary osteoarthritis of right knee 06/17/2015  . Depression, major, recurrent, mild ([place]) 06/17/2015  . Diabetes mellitus type 2, uncontrolled ([place]) 06/17/2015  . Dyslipidemia 06/17/2015  . Benign hypertension 06/17/2015  . Morbid obesity with BMI of 40.0-44.9, adult ([place]) 06/17/2015  .  Vitamin D deficiency 06/17/2015    Past Surgical History:  Procedure Laterality Date  . BREAST REDUCTION SURGERY    . nasal endoscopic    . OOPHORECTOMY  ?   one ovary removed   . REDUCTION MAMMAPLASTY Bilateral 2000  . removal of ovary      Family History  Problem Relation Age of Onset  . Diabetes Mother   . Cancer Mother 62       Breast  . Breast cancer Mother 64  . Gout Mother   . Diabetes Daughter        Oldest Daughter  . Stroke Maternal Uncle   . Breast cancer Maternal Aunt 78  . Diabetes Father   . Kidney cancer Maternal Aunt   . Kidney disease Maternal Aunt     Social History   Socioeconomic History  . Marital status: Divorced    Spouse name: Not on file  . Number of children: 2  . Years of education: Not on file  . Highest education level: Some college, no degree  Occupational History  . Occupation: Designer, television/film set   Tobacco Use  . Smoking status: Never Smoker  . Smokeless tobacco: Never Used  Substance and Sexual Activity  . Alcohol use: No    Alcohol/week: 0.0 standard drinks  . Drug use: No  . Sexual activity: Not Currently    Partners: Male    Birth control/protection: None  Other Topics Concern  .  Not on file  Social History Narrative   Divorced and he died since   Working at Glass blower/designer.    Social Determinants of Health   Financial Resource Strain:   . Difficulty of Paying Living Expenses: Not on file  Food Insecurity:   . Worried About Charity fundraiser in the Last Year: Not on file  . Ran Out of Food in the Last Year: Not on file  Transportation Needs:   . Lack of Transportation (Medical): Not on file  . Lack of Transportation (Non-Medical): Not on file  Physical Activity:   . Days of Exercise per Week: Not on file  . Minutes of Exercise per Session: Not on file  Stress:   . Feeling of Stress : Not on file  Social Connections:   . Frequency of Communication with Friends and Family: Not on file  . Frequency of Social  Gatherings with Friends and Family: Not on file  . Attends Religious Services: Not on file  . Active Member of Clubs or Organizations: Not on file  . Attends Archivist Meetings: Not on file  . Marital Status: Not on file  Intimate Partner Violence:   . Fear of Current or Ex-Partner: Not on file  . Emotionally Abused: Not on file  . Physically Abused: Not on file  . Sexually Abused: Not on file     Current Outpatient Medications:  .  ALPRAZolam (ALPRAZOLAM XR) 0.5 MG 24 hr tablet, Take 1 tablet (0.5 mg total) by mouth daily., Disp: 30 tablet, Rfl: 2 .  aspirin 81 MG tablet, Take 81 mg by mouth daily., Disp: , Rfl:  .  escitalopram (LEXAPRO) 5 MG tablet, Take 1 tablet (5 mg total) by mouth daily., Disp: 30 tablet, Rfl: 0 .  fluticasone (FLONASE) 50 MCG/ACT nasal spray, Place 2 sprays into both nostrils daily., Disp: 16 g, Rfl: 6 .  glucose blood test strip, Use as instructed, Disp: 100 each, Rfl: 12 .  Insulin Degludec-Liraglutide (XULTOPHY) 100-3.6 UNIT-MG/ML SOPN, Inject 40-50 Units into the skin daily., Disp: 15 mL, Rfl: 0 .  Insulin Pen Needle 32G X 6 MM MISC, 1 each by Does not apply route daily., Disp: 100 each, Rfl: 1 .  lisinopril (ZESTRIL) 5 MG tablet, Take 1 tablet (5 mg total) by mouth daily., Disp: 90 tablet, Rfl: 0 .  Melatonin 10 MG TABS, Take by mouth., Disp: , Rfl:  .  meloxicam (MOBIC) 15 MG tablet, Take 1 tablet (15 mg total) by mouth daily., Disp: 30 tablet, Rfl: 0 .  metFORMIN (GLUCOPHAGE-XR) 750 MG 24 hr tablet, Take 2 tablets (1,500 mg total) by mouth daily with breakfast., Disp: 180 tablet, Rfl: 1 .  Multiple Vitamin (MULITIVITAMIN WITH MINERALS) TABS, Take 1 tablet by mouth daily.  , Disp: , Rfl:  .  pravastatin (PRAVACHOL) 20 MG tablet, Take 1 tablet (20 mg total) by mouth 3 (three) times a week., Disp: 30 tablet, Rfl: 0 .  lidocaine (LIDODERM) 5 %, Place 1 patch onto the skin every 12 (twelve) hours. Remove & Discard patch within 12 hours or as directed  by MD (Patient not taking: Reported on 02/01/2020), Disp: 10 patch, Rfl: 0  Allergies  Allergen Reactions  . Atorvastatin     and pravastatin caused muscle cramps, also simvastatin  . Baclofen     spasms  . Compazine Other (See Comments)    Twitching and spasms of neck muscles  . Penicillins     rash    I  personally reviewed active problem list, medication list, allergies, family history, social history, health maintenance with the patient/caregiver today.   ROS  Constitutional: Negative for fever or weight change.  Respiratory: Negative for cough and shortness of breath.   Cardiovascular: Negative for chest pain or palpitations.  Gastrointestinal: Negative for abdominal pain, no bowel changes.  Musculoskeletal: Negative for gait problem or joint swelling.  Skin: Negative for rash.  Neurological: Negative for dizziness or headache.  No other specific complaints in a complete review of systems (except as listed in HPI above).  Objective  Vitals:   02/01/20 0937  BP: 130/72  Pulse: 100  Resp: 16  Temp: (!) 97.5 F (36.4 C)  TempSrc: Temporal  SpO2: 94%  Weight: 282 lb 6.4 oz (128.1 kg)  Height: 5\' 5"  (1.651 m)    Body mass index is 46.99 kg/m.  Physical Exam  Constitutional: Patient appears well-developed and well-nourished. Obese  No distress.  HEENT: head atraumatic, normocephalic, pupils equal and reactive to light Cardiovascular: Normal rate, regular rhythm and normal heart sounds.  No murmur heard. Trace  BLE edema. Pulmonary/Chest: Effort normal and breath sounds normal. No respiratory distress. Abdominal: Soft.  There is no tenderness. Skin: hives on left arm  Psychiatric: Patient has a normal mood and affect. behavior is normal. Judgment and thought content normal.  Recent Results (from the past 2160 hour(s))  Novel Coronavirus, NAA ([facility])     Status: None   Collection Time: 12/19/19  8:10 AM   Specimen: Nasopharyngeal(NP) swabs in vial transport  medium   NASOPHARYNGE  TESTING  Result Value Ref Range   SARS-CoV-2, NAA Not Detected Not Detected    Comment: This nucleic acid amplification test was developed and its performance characteristics determined by [employer]. Nucleic acid amplification tests include PCR and TMA. This test has not been FDA cleared or approved. This test has been authorized by FDA under an Emergency Use Authorization (EUA). This test is only authorized for the duration of time the declaration that circumstances exist justifying the authorization of the emergency use of in vitro diagnostic tests for detection of SARS-CoV-2 virus and/or diagnosis of COVID-19 infection under section 564(b)(1) of the Act, 21 U.S.C. [id number]) (1), unless the authorization is terminated or revoked sooner. When diagnostic testing is negative, the possibility of a false negative result should be considered in the context of a patient's recent exposures and the presence of clinical signs and symptoms consistent with COVID-19. An individual without symptoms of COVID-19 and who is not shedding SARS-CoV-2 virus would  expect to have a negative (not detected) result in this assay.   CBC with Differential     Status: Abnormal   Collection Time: 12/23/19  2:10 AM  Result Value Ref Range   WBC 9.5 4.0 - 10.5 K/uL   RBC 4.76 3.87 - 5.11 MIL/uL   Hemoglobin 12.5 12.0 - 15.0 g/dL   HCT 40.2 36.0 - 46.0 %   MCV 84.5 80.0 - 100.0 fL   MCH 26.3 26.0 - 34.0 pg   MCHC 31.1 30.0 - 36.0 g/dL   RDW 13.5 11.5 - 15.5 %   Platelets 284 150 - 400 K/uL   nRBC 0.0 0.0 - 0.2 %   Neutrophils Relative % 50 %   Neutro Abs 4.8 1.7 - 7.7 K/uL   Lymphocytes Relative 31 %   Lymphs Abs 2.9 0.7 - 4.0 K/uL   Monocytes Relative 7 %   Monocytes Absolute 0.7 0.1 - 1.0 K/uL   Eosinophils  Relative 11 %   Eosinophils Absolute 1.1 (H) 0.0 - 0.5 K/uL   Basophils Relative 1 %   Basophils Absolute 0.1 0.0 - 0.1 K/uL   Immature Granulocytes 0 %   Abs  Immature Granulocytes 0.02 0.00 - 0.07 K/uL    Comment: Performed at [facility], [address] [place], [place] [address]  Comprehensive metabolic panel     Status: Abnormal   Collection Time: 12/23/19  2:10 AM  Result Value Ref Range   Sodium 139 135 - 145 mmol/L   Potassium 3.5 3.5 - 5.1 mmol/L   Chloride 101 98 - 111 mmol/L   CO2 28 22 - 32 mmol/L   Glucose, Bld 201 (H) 70 - 99 mg/dL   BUN 16 6 - 20 mg/dL   Creatinine, Ser 0.86 0.44 - 1.00 mg/dL   Calcium 9.0 8.9 - 10.3 mg/dL   Total Protein 7.2 6.5 - 8.1 g/dL   Albumin 3.5 3.5 - 5.0 g/dL   AST 27 15 - 41 U/L   ALT 22 0 - 44 U/L   Alkaline Phosphatase 86 38 - 126 U/L   Total Bilirubin 0.5 0.3 - 1.2 mg/dL   GFR calc non Af Amer >60 >60 mL/min   GFR calc Af Amer >60 >60 mL/min   Anion gap 10 5 - 15    Comment: Performed at [facility], [address] [place], [place] [address]  Troponin I (High Sensitivity)     Status: None   Collection Time: 12/23/19  2:10 AM  Result Value Ref Range   Troponin I (High Sensitivity) 5 <18 ng/L    Comment: (NOTE) Elevated high sensitivity troponin I (hsTnI) values and significant  changes across serial measurements may suggest ACS but many other  chronic and acute conditions are known to elevate hsTnI results.  Refer to the "Links" section for chest pain algorithms and additional  guidance. Performed at [facility], [place], [place] [address]   Troponin I (High Sensitivity)     Status: None   Collection Time: 12/23/19  4:36 AM  Result Value Ref Range   Troponin I (High Sensitivity) 4 <18 ng/L    Comment: (NOTE) Elevated high sensitivity troponin I (hsTnI) values and significant  changes across serial measurements may suggest ACS but many other  chronic and acute conditions are known to elevate hsTnI results.  Refer to the "Links" section for chest pain algorithms and additional  guidance. Performed at [facility], [address] [place], [place] [address]   Novel Coronavirus, NAA ([facility])     Status: None   Collection Time: 01/09/20  9:37 AM   Specimen: Nasopharyngeal(NP) swabs in vial transport medium   NASOPHARYNGE  TESTING  Result Value Ref Range   SARS-CoV-2, NAA Not Detected Not Detected    Comment: This nucleic acid amplification test was developed and its performance characteristics determined by [employer]. Nucleic acid amplification tests include PCR and TMA. This test has not been FDA cleared or approved. This test has been authorized by FDA under an Emergency Use Authorization (EUA). This test is only authorized for the duration of time the declaration that circumstances exist justifying the authorization of the emergency use of in vitro diagnostic tests for detection of SARS-CoV-2 virus and/or diagnosis of COVID-19 infection under section 564(b)(1) of the Act, 21 U.S.C. [id number]) (1), unless the authorization is terminated or revoked sooner. When diagnostic testing is negative, the possibility of a false negative result  should be considered in the context of a patient's recent exposures and the presence of clinical signs and symptoms consistent with COVID-19. An individual without symptoms of COVID-19 and who is not shedding SARS-CoV-2 virus would  expect to have a negative (not detected) result in this assay.   POCT HgB A1C     Status: Abnormal   Collection Time: 01/11/20  2:59 PM  Result Value Ref Range   Hemoglobin A1C 8.1 (A) 4.0 - 5.6 %   HbA1c POC (<> result, manual entry)     HbA1c, POC (prediabetic range)     HbA1c, POC (controlled diabetic range)    CBC     Status: None   Collection Time: 01/12/20  5:50 PM  Result Value Ref Range   WBC 8.2 4.0 - 10.5 K/uL   RBC 4.96 3.87 - 5.11 MIL/uL   Hemoglobin 13.1 12.0 - 15.0 g/dL   HCT 40.9 36.0 - 46.0 %   MCV 82.5 80.0 - 100.0 fL   MCH 26.4 26.0 - 34.0 pg   MCHC 32.0 30.0 - 36.0 g/dL   RDW 13.6 11.5 - 15.5 %    Platelets 249 150 - 400 K/uL   nRBC 0.0 0.0 - 0.2 %    Comment: Performed at [facility], [address] [place], [place] [address]  Comprehensive metabolic panel     Status: Abnormal   Collection Time: 01/12/20  5:50 PM  Result Value Ref Range   Sodium 139 135 - 145 mmol/L   Potassium 3.6 3.5 - 5.1 mmol/L   Chloride 101 98 - 111 mmol/L   CO2 28 22 - 32 mmol/L   Glucose, Bld 139 (H) 70 - 99 mg/dL   BUN 21 (H) 6 - 20 mg/dL   Creatinine, Ser 1.02 (H) 0.44 - 1.00 mg/dL   Calcium 9.0 8.9 - 10.3 mg/dL   Total Protein 7.3 6.5 - 8.1 g/dL   Albumin 3.4 (L) 3.5 - 5.0 g/dL   AST 32 15 - 41 U/L   ALT 43 0 - 44 U/L   Alkaline Phosphatase 105 38 - 126 U/L   Total Bilirubin 1.1 0.3 - 1.2 mg/dL   GFR calc non Af Amer >60 >60 mL/min   GFR calc Af Amer >60 >60 mL/min   Anion gap 10 5 - 15    Comment: Performed at [facility], [address] [place], [place] [address]      PHQ2/9: Depression screen [facility] 2/9 02/01/2020 01/11/2020 11/18/2019 11/04/2019 09/28/2019  Decreased Interest 1 1 0 0 1  Down, Depressed, Hopeless 1 1 0 1 1  PHQ - 2 Score 2 2 0 1 2  Altered sleeping 0 1 0 0 1  Tired, decreased energy 1 1 0 0 1  Change in appetite 0 1 0 0 1  Feeling bad or failure about yourself  1 1 0 0 1  Trouble concentrating 0 0 0 0 1  Moving slowly or fidgety/restless 0 1 0 0 1  Suicidal thoughts 0 0 0 0 0  PHQ-9 Score 4 7 0 1 8  Difficult doing work/chores Not difficult at all Somewhat difficult Not difficult at all Not difficult at all Somewhat difficult  Some recent data might be hidden    phq 9 is positive   Fall Risk: Fall Risk  02/01/2020 01/11/2020 11/18/2019 11/04/2019 09/28/2019  Falls in the past year? 0 0 0 0 0  Number falls in past yr: 0 0 0 0 0  Injury with  Fall? 0 0 0 0 0  Follow up Falls evaluation completed - Falls evaluation completed - -     Functional Status Survey: Is the patient deaf or have difficulty hearing?: No Does the patient have difficulty  seeing, even when wearing glasses/contacts?: No Does the patient have difficulty concentrating, remembering, or making decisions?: No Does the patient have difficulty walking or climbing stairs?: No Does the patient have difficulty dressing or bathing?: No Does the patient have difficulty doing errands alone such as visiting a doctor's office or shopping?: No    Assessment & Plan  1. Benign hypertension  bp is at goal today, continue ace  2. Hives  Seeing ENT and taking Zytec, explained that hives can be secondary to stress   3. Lower extremity edema  Advised compression stocking hoses, avoid salt intake, pump legs while sitting

## 2020-02-01 NOTE — Telephone Encounter (Signed)
Copied from [place] [phone]. Topic: General - Inquiry >> Jan 31, 2020  4:56 PM Mathis Bud wrote: Reason for CRM: Patient would like to make an appt in the office.  Patient states that she has shortness of breath due to her weight, and is being treated for a sinus infection at her ENT.  Patient states she had two recent covid test done and both were negative.  Patient has had some ER visit lately and needs FU visit, she does not want virtual.  Call back [phone] >> Feb 01, 2020  7:40 AM [place], Marland Kitchen wrote: Pt wants in person appt only. She is scheduled for 9:40 today. Advise if this needs to be virtual.

## 2020-02-08 ENCOUNTER — Encounter: Payer: Self-pay | Admitting: Family Medicine

## 2020-02-11 ENCOUNTER — Other Ambulatory Visit: Payer: Self-pay | Admitting: Family Medicine

## 2020-02-11 DIAGNOSIS — F330 Major depressive disorder, recurrent, mild: Secondary | ICD-10-CM

## 2020-02-11 DIAGNOSIS — E1142 Type 2 diabetes mellitus with diabetic polyneuropathy: Secondary | ICD-10-CM

## 2020-02-11 NOTE — Telephone Encounter (Signed)
Requested Prescriptions  Pending Prescriptions Disp Refills  . pravastatin (PRAVACHOL) 20 MG tablet [Pharmacy Med Name: Pravastatin Sodium 20 MG Oral Tablet] 30 tablet 0    Sig: TAKE ONE TABLET BY MOUTH THREE TIMES WEEKLY     Cardiovascular:  Antilipid - Statins Failed - 02/11/2020  8:41 AM      Failed - Triglycerides in normal range and within 360 days    Triglycerides  Date Value Ref Range Status  06/20/2019 184 (H) <150 mg/dL Final         Passed - Total Cholesterol in normal range and within 360 days    Cholesterol, Total  Date Value Ref Range Status  06/19/2015 217 (H) 100 - 199 mg/dL Final   Cholesterol  Date Value Ref Range Status  06/20/2019 174 <200 mg/dL Final         Passed - LDL in normal range and within 360 days    LDL Cholesterol (Calc)  Date Value Ref Range Status  06/20/2019 94 mg/dL (calc) Final    Comment:    Reference range: <100 . Desirable range <100 mg/dL for primary prevention;   <70 mg/dL for patients with CHD or diabetic patients  with > or = 2 CHD risk factors. Marland Kitchen LDL-C is now calculated using the Martin-Hopkins  calculation, which is a validated novel method providing  better accuracy than the Friedewald equation in the  estimation of LDL-C.  Cresenciano Genre et al. Annamaria Helling. [id number]): 2061-2068  (http://education.[facility].com/faq/FAQ164)          Passed - HDL in normal range and within 360 days    HDL  Date Value Ref Range Status  06/20/2019 52 > OR = 50 mg/dL Final  06/19/2015 68 >39 mg/dL Final    Comment:    According to ATP-III Guidelines, HDL-C >59 mg/dL is considered a negative risk factor for CHD.          Passed - Patient is not pregnant      Passed - Valid encounter within last 12 months    Recent Outpatient Visits          1 week ago Benign hypertension   [facility] [place], Drue Stager, MD   1 month ago Diabetic peripheral neuropathy associated with type 2 diabetes mellitus [facility])   [facility] Steele Sizer, MD   2 months ago Acute recurrent maxillary sinusitis   [facility] [place], FNP   3 months ago Acute recurrent maxillary sinusitis   [facility] Delsa Grana, PA-C   4 months ago Diabetic peripheral neuropathy associated with type 2 diabetes mellitus [facility])   [facility] Steele Sizer, MD      Future Appointments            In 2 months Ancil Boozer, Drue Stager, MD [facility], [facility]

## 2020-02-11 NOTE — Telephone Encounter (Signed)
Requested Prescriptions  Pending Prescriptions Disp Refills  . XULTOPHY 100-3.6 UNIT-MG/ML SOPN [Pharmacy Med Name: Xultophy 100-3.6 UNIT-MG/ML Subcutaneous Solution Pen-injector] 15 mL 0    Sig: INJECT 40 TO 50 UNITS INTO THE SKIN DAILY     Endocrinology:  Diabetes - Insulin + GLP-1 Receptor Agonist Combos 1 Failed - 02/11/2020  9:55 AM      Failed - HBA1C is between 0 and 7.9 and within 180 days    Hemoglobin A1C  Date Value Ref Range Status  01/11/2020 8.1 (A) 4.0 - 5.6 % Final   HbA1c, POC (prediabetic range)  Date Value Ref Range Status  12/31/2018 7.0 (A) 5.7 - 6.4 % Final   HbA1c, POC (controlled diabetic range)  Date Value Ref Range Status  09/28/2019 7.9 (A) 0.0 - 7.0 % Final         Passed - Valid encounter within last 6 months    Recent Outpatient Visits          1 week ago Benign hypertension   [facility] [place], Drue Stager, MD   1 month ago Diabetic peripheral neuropathy associated with type 2 diabetes mellitus [facility])   [facility] Steele Sizer, MD   2 months ago Acute recurrent maxillary sinusitis   [facility] [place], FNP   3 months ago Acute recurrent maxillary sinusitis   [facility] Delsa Grana, PA-C   4 months ago Diabetic peripheral neuropathy associated with type 2 diabetes mellitus [facility])   [facility] Steele Sizer, MD      Future Appointments            In 2 months Ancil Boozer, Drue Stager, MD [facility], [facility]

## 2020-02-13 DIAGNOSIS — J330 Polyp of nasal cavity: Secondary | ICD-10-CM | POA: Diagnosis not present

## 2020-02-13 DIAGNOSIS — L272 Dermatitis due to ingested food: Secondary | ICD-10-CM | POA: Diagnosis not present

## 2020-02-13 DIAGNOSIS — J301 Allergic rhinitis due to pollen: Secondary | ICD-10-CM | POA: Diagnosis not present

## 2020-02-13 DIAGNOSIS — J329 Chronic sinusitis, unspecified: Secondary | ICD-10-CM | POA: Diagnosis not present

## 2020-02-16 ENCOUNTER — Encounter: Payer: Self-pay | Admitting: Family Medicine

## 2020-02-20 ENCOUNTER — Encounter: Payer: Self-pay | Admitting: Family Medicine

## 2020-02-20 ENCOUNTER — Other Ambulatory Visit: Payer: Self-pay | Admitting: Family Medicine

## 2020-02-20 DIAGNOSIS — M5030 Other cervical disc degeneration, unspecified cervical region: Secondary | ICD-10-CM

## 2020-03-08 ENCOUNTER — Other Ambulatory Visit: Payer: Self-pay | Admitting: Family Medicine

## 2020-03-08 DIAGNOSIS — E1142 Type 2 diabetes mellitus with diabetic polyneuropathy: Secondary | ICD-10-CM

## 2020-03-09 ENCOUNTER — Encounter: Payer: Self-pay | Admitting: Family Medicine

## 2020-03-14 DIAGNOSIS — J329 Chronic sinusitis, unspecified: Secondary | ICD-10-CM | POA: Diagnosis not present

## 2020-03-14 DIAGNOSIS — J301 Allergic rhinitis due to pollen: Secondary | ICD-10-CM | POA: Diagnosis not present

## 2020-03-14 DIAGNOSIS — J330 Polyp of nasal cavity: Secondary | ICD-10-CM | POA: Diagnosis not present

## 2020-03-19 ENCOUNTER — Ambulatory Visit: Payer: BC Managed Care – PPO

## 2020-03-22 DIAGNOSIS — M1990 Unspecified osteoarthritis, unspecified site: Secondary | ICD-10-CM

## 2020-03-22 HISTORY — DX: Unspecified osteoarthritis, unspecified site: M19.90

## 2020-04-04 ENCOUNTER — Other Ambulatory Visit: Payer: Self-pay | Admitting: Family Medicine

## 2020-04-04 DIAGNOSIS — I10 Essential (primary) hypertension: Secondary | ICD-10-CM

## 2020-04-04 DIAGNOSIS — Z6841 Body Mass Index (BMI) 40.0 and over, adult: Secondary | ICD-10-CM | POA: Diagnosis not present

## 2020-04-04 DIAGNOSIS — M25561 Pain in right knee: Secondary | ICD-10-CM | POA: Diagnosis not present

## 2020-04-04 DIAGNOSIS — M170 Bilateral primary osteoarthritis of knee: Secondary | ICD-10-CM | POA: Diagnosis not present

## 2020-04-09 DIAGNOSIS — E1142 Type 2 diabetes mellitus with diabetic polyneuropathy: Secondary | ICD-10-CM | POA: Diagnosis not present

## 2020-04-09 DIAGNOSIS — I10 Essential (primary) hypertension: Secondary | ICD-10-CM | POA: Diagnosis not present

## 2020-04-09 DIAGNOSIS — E785 Hyperlipidemia, unspecified: Secondary | ICD-10-CM | POA: Diagnosis not present

## 2020-04-10 LAB — COMPLETE METABOLIC PANEL WITH GFR
AG Ratio: 1.3 (calc) (ref 1.0–2.5)
ALT: 10 U/L (ref 6–29)
AST: 13 U/L (ref 10–35)
Albumin: 3.7 g/dL (ref 3.6–5.1)
Alkaline phosphatase (APISO): 70 U/L (ref 37–153)
BUN: 16 mg/dL (ref 7–25)
CO2: 26 mmol/L (ref 20–32)
Calcium: 9.5 mg/dL (ref 8.6–10.4)
Chloride: 108 mmol/L (ref 98–110)
Creat: 0.87 mg/dL (ref 0.50–1.05)
GFR, Est African American: 86 mL/min/{1.73_m2} (ref >=60)
GFR, Est Non African American: 74 mL/min/{1.73_m2} (ref >=60)
Globulin: 2.9 g/dL (calc) (ref 1.9–3.7)
Glucose, Bld: 102 mg/dL — ABNORMAL HIGH (ref 65–99)
Potassium: 3.9 mmol/L (ref 3.5–5.3)
Sodium: 142 mmol/L (ref 135–146)
Total Bilirubin: 0.3 mg/dL (ref 0.2–1.2)
Total Protein: 6.6 g/dL (ref 6.1–8.1)

## 2020-04-10 LAB — HEMOGLOBIN A1C
Hgb A1c MFr Bld: 6.7 % of total Hgb — ABNORMAL HIGH (ref <5.7)
Mean Plasma Glucose: 146 (calc)
eAG (mmol/L): 8.1 (calc)

## 2020-04-10 LAB — CBC WITH DIFFERENTIAL/PLATELET
Absolute Monocytes: 606 cells/uL (ref 200–950)
Basophils Absolute: 50 cells/uL (ref 0–200)
Basophils Relative: 0.6 %
Eosinophils Absolute: 1046 cells/uL — ABNORMAL HIGH (ref 15–500)
Eosinophils Relative: 12.6 %
HCT: 39.6 % (ref 35.0–45.0)
Hemoglobin: 12.7 g/dL (ref 11.7–15.5)
Lymphs Abs: 2606 cells/uL (ref 850–3900)
MCH: 26 pg — ABNORMAL LOW (ref 27.0–33.0)
MCHC: 32.1 g/dL (ref 32.0–36.0)
MCV: 81.1 fL (ref 80.0–100.0)
MPV: 10.8 fL (ref 7.5–12.5)
Monocytes Relative: 7.3 %
Neutro Abs: 3992 cells/uL (ref 1500–7800)
Neutrophils Relative %: 48.1 %
Platelets: 287 10*3/uL (ref 140–400)
RBC: 4.88 10*6/uL (ref 3.80–5.10)
RDW: 12.9 % (ref 11.0–15.0)
Total Lymphocyte: 31.4 %
WBC: 8.3 10*3/uL (ref 3.8–10.8)

## 2020-04-10 LAB — LIPID PANEL
Cholesterol: 183 mg/dL (ref <200)
HDL: 56 mg/dL (ref >=50)
LDL Cholesterol (Calc): 108 mg/dL (calc) — ABNORMAL HIGH
Non-HDL Cholesterol (Calc): 127 mg/dL (calc) (ref <130)
Total CHOL/HDL Ratio: 3.3 (calc) (ref <5.0)
Triglycerides: 98 mg/dL (ref <150)

## 2020-04-11 ENCOUNTER — Other Ambulatory Visit: Payer: Self-pay

## 2020-04-11 ENCOUNTER — Ambulatory Visit (INDEPENDENT_AMBULATORY_CARE_PROVIDER_SITE_OTHER): Payer: BC Managed Care – PPO | Admitting: Family Medicine

## 2020-04-11 ENCOUNTER — Encounter: Payer: Self-pay | Admitting: Family Medicine

## 2020-04-11 VITALS — BP 140/90 | HR 103 | Temp 97.3°F | Resp 16 | Ht 64.0 in | Wt 273.1 lb

## 2020-04-11 DIAGNOSIS — F411 Generalized anxiety disorder: Secondary | ICD-10-CM

## 2020-04-11 DIAGNOSIS — Z0000 Encounter for general adult medical examination without abnormal findings: Secondary | ICD-10-CM

## 2020-04-11 DIAGNOSIS — E1142 Type 2 diabetes mellitus with diabetic polyneuropathy: Secondary | ICD-10-CM | POA: Diagnosis not present

## 2020-04-11 DIAGNOSIS — F330 Major depressive disorder, recurrent, mild: Secondary | ICD-10-CM

## 2020-04-11 DIAGNOSIS — R32 Unspecified urinary incontinence: Secondary | ICD-10-CM

## 2020-04-11 DIAGNOSIS — M5030 Other cervical disc degeneration, unspecified cervical region: Secondary | ICD-10-CM | POA: Diagnosis not present

## 2020-04-11 DIAGNOSIS — F5101 Primary insomnia: Secondary | ICD-10-CM

## 2020-04-11 DIAGNOSIS — R600 Localized edema: Secondary | ICD-10-CM | POA: Diagnosis not present

## 2020-04-11 DIAGNOSIS — E785 Hyperlipidemia, unspecified: Secondary | ICD-10-CM

## 2020-04-11 DIAGNOSIS — I10 Essential (primary) hypertension: Secondary | ICD-10-CM

## 2020-04-11 DIAGNOSIS — Z6841 Body Mass Index (BMI) 40.0 and over, adult: Secondary | ICD-10-CM

## 2020-04-11 DIAGNOSIS — L509 Urticaria, unspecified: Secondary | ICD-10-CM

## 2020-04-11 DIAGNOSIS — M170 Bilateral primary osteoarthritis of knee: Secondary | ICD-10-CM

## 2020-04-11 DIAGNOSIS — J339 Nasal polyp, unspecified: Secondary | ICD-10-CM

## 2020-04-11 DIAGNOSIS — M797 Fibromyalgia: Secondary | ICD-10-CM

## 2020-04-11 MED ORDER — ALPRAZOLAM ER 0.5 MG PO TB24: 0.5000 mg | ORAL_TABLET | Freq: Every day | ORAL | 2 refills | Status: DC

## 2020-04-11 MED ORDER — DULOXETINE HCL 30 MG PO CPEP: 30.0000 mg | ORAL_CAPSULE | Freq: Every day | ORAL | 0 refills | Status: DC

## 2020-04-11 MED ORDER — XULTOPHY 100-3.6 UNIT-MG/ML ~~LOC~~ SOPN: 50.0000 [IU] | PEN_INJECTOR | Freq: Every day | SUBCUTANEOUS | 2 refills | Status: DC

## 2020-04-11 MED ORDER — LISINOPRIL 10 MG PO TABS: 5.0000 mg | ORAL_TABLET | Freq: Every day | ORAL | 1 refills | Status: DC

## 2020-04-11 MED ORDER — HYDROXYZINE HCL 10 MG PO TABS: 10.0000 mg | ORAL_TABLET | Freq: Every evening | ORAL | 0 refills | Status: DC

## 2020-04-11 MED ORDER — METFORMIN HCL ER 750 MG PO TB24: 1500.0000 mg | ORAL_TABLET | Freq: Every day | ORAL | 1 refills | Status: DC

## 2020-04-11 MED ORDER — CELECOXIB 200 MG PO CAPS: 200.0000 mg | ORAL_CAPSULE | Freq: Every day | ORAL | 1 refills | Status: DC

## 2020-04-11 NOTE — Progress Notes (Signed)
Name: Lindsay Mcdonald   MRN: [id number]    DOB: 08/09/1961   Date:04/11/2020       Progress Note  Subjective  Chief Complaint  Chief Complaint  Patient presents with  . Annual Exam    HPI  Patient presents for annual CPE and follow up  FMS: she has a long history of feeling aching and tenderworse on trunk and upper extremities. . Pain on shoulders, arms, stiff on her hips . Feels a little stiff when first gets up, she has some mental fogginess,she stopped Lyrica again on her own, she does not like taking medications and goggled side effects, she has been on duloxetine but asked to go back on it   DMII:glucose lately has been elevated due to prednisone  A1C went from 6.6 % to 7.9% to 8.1% while on prednisone but is down to 6.7 % now,  on Metformin and Xultophy 50 units daily She has neuropathy and pain feels like water flowing down her legs , eye exam is up to date . She denies polyphagia or polydipsia  Insomnia: long history taking otc supplements she thinks secondary to pain , we will try Trazodone   HTN: she was  taking lisinopril/HCTZ but her last bp in our office was really low and we stopped hctz, bp today is borderline we will increase dose of lisinopril from 5 to 10 mg today . She denies side  effects, denies chest pain,SOBor palpitation.  Hyperlipidemia: she stopped taking pravastatin again, she states sometimes she has muscle pain, but it happens even when not taking medication, explained the importance of statin therapy for patients that have DM  Major Depression with anxiety: she took Zoloft in the past, states Cymbalta caused "weird feeling "but did not take long enough, she was afraid to start Lexapro because of side effects. She is also worried about depending on SSRI, but not bothered by Alprazolam XR. Explained that BZD's are controlled and she should try to start SSRI to be able to come off BZD. Shetried taking Prozac for one month Summer of 2019 however did not  noticed improvement of symptoms of depression or anxiety and stopped it on her own. She is willing to try Lexapro   Pruritis: she is using Aveno lotion, she states happens after showering and sometimes at night. No rashes.She is seeing ENT, advised allergy testing, explained anxiety can cause but also allergies, she states it improves with zyrtec Unchanged    Morbid Obesity:BMI is still very high, she is trying to lose weight.   DDD cervical spine: she states symptoms were present for weeks, finally wen to [facility] 12/23/2019 for evaluation, had c- spine x-ray that showed DDD C5-6, also had a normal shoulder x-ray. Pain was sharp and shooting down left elbow, it was affecting her sleep, she saw Dr. Rudene Christians and he recommend evaluation by neurosurgeon and PT , but neurosurgeon advised follow up with Dr. Phyllis Ginger, her appointment with psyatrist is on April 30 th. She states meloxicam helps with knee pain, but not with burning going down her arm. She asked for Duloxetine and I explained she took it in the past and I will be glad to send a refill   OA both knees also has tendinitis on left knee: she saw Dr. Rudene Christians on 04/14 , had steroid injection on right knee.  Nasal polyp: on the right side, seeing Dr. Ladene Artist and may need another surgery soon  Diet: eating healthier  Exercise: has to use stairs at home  USPSTF grade A and B recommendations    Office Visit from 04/11/2020 in [facility]  AUDIT-C Score  1     Depression: Phq 9 is  positive Depression screen [facility] 2/9 04/11/2020 02/01/2020 01/11/2020 11/18/2019 11/04/2019  Decreased Interest 1 1 1  0 0  Down, Depressed, Hopeless 1 1 1  0 1  PHQ - 2 Score 2 2 2  0 1  Altered sleeping 3 0 1 0 0  Tired, decreased energy 2 1 1  0 0  Change in appetite 1 0 1 0 0  Feeling bad or failure about yourself  2 1 1  0 0  Trouble concentrating 0 0 0 0 0  Moving slowly or fidgety/restless 1 0 1 0 0  Suicidal thoughts 0 0 0 0 0  PHQ-9 Score 11 4 7  0  1  Difficult doing work/chores Somewhat difficult Not difficult at all Somewhat difficult Not difficult at all Not difficult at all  Some recent data might be hidden   Hypertension: BP Readings from Last 3 Encounters:  04/11/20 140/90  02/01/20 130/72  01/21/20 (!) 170/96   Obesity: Wt Readings from Last 3 Encounters:  04/11/20 273 lb 1.6 oz (123.9 kg)  02/01/20 282 lb 6.4 oz (128.1 kg)  01/21/20 275 lb (124.7 kg)   BMI Readings from Last 3 Encounters:  04/11/20 46.88 kg/m  02/01/20 46.99 kg/m  01/21/20 45.76 kg/m     Hep C Screening: 06/2012  STD testing and prevention (HIV/chl/gon/syphilis):N/A Intimate partner violence:negative  Sexual History (Partners/Practices/Protection from [employer] hx STI/Pregnancy Plans):not sexually active for over 20 years Pain during Intercourse: N/A Menstrual History/LMP/Abnormal Bleeding: post-menopausal , discussed importance of follow up if any post-menopausal bleeding  Incontinence Symptoms: she has stress and urgency incontinence   Breast cancer:  - Last Mammogram: 05/2019  - BRCA gene done and negative   Osteoporosis: Discussed high calcium and vitamin D supplementation, weight bearing exercises  Cervical cancer screening: 06/15/2019   Skin cancer: Discussed monitoring for atypical lesions  Colorectal cancer: 01/12/2019  Lung cancer:   Low Dose CT Chest recommended if Age 57-80 years, 30 pack-year currently smoking OR have quit w/in 15years. Patient does not qualify.   ECG: 12/2019   Advanced Care Planning: A voluntary discussion about advance care planning including the explanation and discussion of advance directives.  Discussed health care proxy and Living will, and the patient was able to identify a health care proxy as her daughter Darrick Meigs  .  Patient does not have a living will at present time. If patient does have living will, I have requested they bring this to the clinic to be scanned in to their chart.  Lipids: Lab  Results  Component Value Date   CHOL 183 04/09/2020   CHOL 174 06/20/2019   CHOL 168 05/31/2018   Lab Results  Component Value Date   HDL 56 04/09/2020   HDL 52 06/20/2019   HDL 56 05/31/2018   Lab Results  Component Value Date   LDLCALC 108 (H) 04/09/2020   LDLCALC 94 06/20/2019   LDLCALC 91 05/31/2018   Lab Results  Component Value Date   TRIG 98 04/09/2020   TRIG 184 (H) 06/20/2019   TRIG 109 05/31/2018   Lab Results  Component Value Date   CHOLHDL 3.3 04/09/2020   CHOLHDL 3.3 06/20/2019   CHOLHDL 3.0 05/31/2018   No results found for: LDLDIRECT  Glucose: Glucose  Date Value Ref Range Status  08/01/2014 229 (H) 65 - 99 mg/dL Final  07/11/2014  225 (H) 65 - 99 mg/dL Final  07/20/2012 182 (H) 65 - 99 mg/dL Final   Glucose, Bld  Date Value Ref Range Status  04/09/2020 102 (H) 65 - 99 mg/dL Final    Comment:    .            Fasting reference interval . For someone without known diabetes, a glucose value between 100 and 125 mg/dL is consistent with prediabetes and should be confirmed with a follow-up test. .   01/12/2020 139 (H) 70 - 99 mg/dL Final  12/23/2019 201 (H) 70 - 99 mg/dL Final   Glucose-Capillary  Date Value Ref Range Status  03/13/2019 175 (H) 70 - 99 mg/dL Final    Patient Active Problem List   Diagnosis Date Noted  . History of marijuana use 03/30/2019  . Fibromyalgia 12/31/2018  . Venous vascular malformations 03/27/2016  . Diabetic peripheral neuropathy associated with type 2 diabetes mellitus ([place]) 09/26/2015  . Non compliance w medication regimen 09/26/2015  . Seasonal allergic rhinitis 09/26/2015  . Muscle spasms of neck 06/19/2015  . Insomnia 06/19/2015  . Primary osteoarthritis of right knee 06/17/2015  . Depression, major, recurrent, mild ([place]) 06/17/2015  . Diabetes mellitus type 2, uncontrolled ([place]) 06/17/2015  . Dyslipidemia 06/17/2015  . Benign hypertension 06/17/2015  . Morbid obesity with BMI of 40.0-44.9, adult  ([place]) 06/17/2015  . Vitamin D deficiency 06/17/2015    Past Surgical History:  Procedure Laterality Date  . BREAST REDUCTION SURGERY    . nasal endoscopic    . OOPHORECTOMY  ?   one ovary removed   . REDUCTION MAMMAPLASTY Bilateral 2000  . removal of ovary      Family History  Problem Relation Age of Onset  . Diabetes Mother   . Cancer Mother 80       Breast  . Breast cancer Mother 6  . Gout Mother   . Diabetes Daughter        Oldest Daughter  . Stroke Maternal Uncle   . Breast cancer Maternal Aunt 4  . Diabetes Father   . Kidney cancer Maternal Aunt   . Kidney disease Maternal Aunt     Social History   Socioeconomic History  . Marital status: Divorced    Spouse name: Not on file  . Number of children: 2  . Years of education: Not on file  . Highest education level: Some college, no degree  Occupational History  . Occupation: Designer, television/film set   Tobacco Use  . Smoking status: Never Smoker  . Smokeless tobacco: Never Used  Substance and Sexual Activity  . Alcohol use: No    Alcohol/week: 0.0 standard drinks  . Drug use: No  . Sexual activity: Not Currently    Partners: Male    Birth control/protection: None  Other Topics Concern  . Not on file  Social History Narrative   Divorced and he died since   Working at Glass blower/designer.    Social Determinants of Health   Financial Resource Strain: Medium Risk  . Difficulty of Paying Living Expenses: Somewhat hard  Food Insecurity: Food Insecurity Present  . Worried About Charity fundraiser in the Last Year: Sometimes true  . Ran Out of Food in the Last Year: Sometimes true  Transportation Needs: No Transportation Needs  . Lack of Transportation (Medical): No  . Lack of Transportation (Non-Medical): No  Physical Activity: Insufficiently Active  . Days of Exercise per Week: 7 days  . Minutes of Exercise per  Session: 10 min  Stress: No Stress Concern Present  . Feeling of Stress : Only a little  Social  Connections: Slightly Isolated  . Frequency of Communication with Friends and Family: More than three times a week  . Frequency of Social Gatherings with Friends and Family: Once a week  . Attends Religious Services: 1 to 4 times per year  . Active Member of Clubs or Organizations: Yes  . Attends Archivist Meetings: Never  . Marital Status: Divorced  Human resources officer Violence: Not At Risk  . Fear of Current or Ex-Partner: No  . Emotionally Abused: No  . Physically Abused: No  . Sexually Abused: No     Current Outpatient Medications:  .  ALPRAZolam (ALPRAZOLAM XR) 0.5 MG 24 hr tablet, Take 1 tablet (0.5 mg total) by mouth daily., Disp: 30 tablet, Rfl: 2 .  aspirin 81 MG tablet, Take 81 mg by mouth daily., Disp: , Rfl:  .  fluticasone (FLONASE) 50 MCG/ACT nasal spray, Place 2 sprays into both nostrils daily., Disp: 16 g, Rfl: 6 .  glucose blood test strip, Use as instructed, Disp: 100 each, Rfl: 12 .  Insulin Degludec-Liraglutide (XULTOPHY) 100-3.6 UNIT-MG/ML SOPN, Inject 50 Units into the skin daily., Disp: 15 mL, Rfl: 2 .  Insulin Pen Needle 32G X 6 MM MISC, 1 each by Does not apply route daily., Disp: 100 each, Rfl: 1 .  levofloxacin (LEVAQUIN) 500 MG tablet, Take 500 mg by mouth daily., Disp: , Rfl:  .  lidocaine (LIDODERM) 5 %, Place 1 patch onto the skin every 12 (twelve) hours. Remove & Discard patch within 12 hours or as directed by MD, Disp: 10 patch, Rfl: 0 .  lisinopril (ZESTRIL) 10 MG tablet, Take 0.5 tablets (5 mg total) by mouth daily., Disp: 90 tablet, Rfl: 1 .  Melatonin 10 MG TABS, Take by mouth., Disp: , Rfl:  .  metFORMIN (GLUCOPHAGE-XR) 750 MG 24 hr tablet, Take 2 tablets (1,500 mg total) by mouth daily with breakfast., Disp: 180 tablet, Rfl: 1 .  Multiple Vitamin (MULITIVITAMIN WITH MINERALS) TABS, Take 1 tablet by mouth daily.  , Disp: , Rfl:  .  pravastatin (PRAVACHOL) 20 MG tablet, TAKE ONE TABLET BY MOUTH THREE TIMES WEEKLY, Disp: 30 tablet, Rfl: 0 .   celecoxib (CELEBREX) 200 MG capsule, Take 1 capsule (200 mg total) by mouth daily., Disp: 90 capsule, Rfl: 1 .  DULoxetine (CYMBALTA) 30 MG capsule, Take 1-2 capsules (30-60 mg total) by mouth daily. First week take one after that two daily and call back for 97m dose, Disp: 60 capsule, Rfl: 0 .  hydrOXYzine (ATARAX/VISTARIL) 10 MG tablet, Take 1 tablet (10 mg total) by mouth at bedtime., Disp: 30 tablet, Rfl: 0  Allergies  Allergen Reactions  . Atorvastatin     and pravastatin caused muscle cramps, also simvastatin  . Baclofen     spasms  . Compazine Other (See Comments)    Twitching and spasms of neck muscles  . Penicillins     rash     ROS  Constitutional: Negative for fever or weight change.  Respiratory: Negative for cough and shortness of breath.   Cardiovascular: Negative for chest pain or palpitations.  Gastrointestinal: Negative for abdominal pain, no bowel changes.  Musculoskeletal:positive for gait problem or joint swelling.  Skin: Negative for rash.  Neurological: Negative for dizziness or headache.  No other specific complaints in a complete review of systems (except as listed in HPI above).  Objective  Vitals:  04/11/20 1311  BP: 140/90  Pulse: (!) 103  Resp: 16  Temp: (!) 97.3 F (36.3 C)  TempSrc: Temporal  SpO2: 97%  Weight: 273 lb 1.6 oz (123.9 kg)  Height: '5\' 4"'$  (1.626 m)    Body mass index is 46.88 kg/m.  Physical Exam  Constitutional: Patient appears well-developed and well-nourished. Obese No distress.  HENT: Head: Normocephalic and atraumatic. Ears: B TMs ok, no erythema or effusion; Nose: not done  Eyes: Conjunctivae and EOM are normal. Pupils are equal, round, and reactive to light. No scleral icterus.  Neck: Normal range of motion. Neck supple. No JVD present. No thyromegaly present.  Cardiovascular: Normal rate, regular rhythm and normal heart sounds.  No murmur heard. No BLE edema. Pulmonary/Chest: Effort normal and breath sounds  normal. No respiratory distress. Abdominal: Soft. Bowel sounds are normal, no distension. There is no tenderness. no masses Breast: scar from previous breast reduction surgery  FEMALE GENITALIA:  Not done RECTAL: not done  Musculoskeletal: Normal range of motion, no joint effusions. Crepitus with extension of both knees  Neurological: he is alert and oriented to person, place, and time. No cranial nerve deficit. Coordination, balance, strength, speech and gait are normal.  Skin: Skin is warm and dry. No rash noted. No erythema.  Psychiatric: Patient has a normal mood and affect. behavior is normal. Judgment and thought content normal.  Recent Results (from the past 2160 hour(s))  CBC     Status: None   Collection Time: 01/12/20  5:50 PM  Result Value Ref Range   WBC 8.2 4.0 - 10.5 K/uL   RBC 4.96 3.87 - 5.11 MIL/uL   Hemoglobin 13.1 12.0 - 15.0 g/dL   HCT 40.9 36.0 - 46.0 %   MCV 82.5 80.0 - 100.0 fL   MCH 26.4 26.0 - 34.0 pg   MCHC 32.0 30.0 - 36.0 g/dL   RDW 13.6 11.5 - 15.5 %   Platelets 249 150 - 400 K/uL   nRBC 0.0 0.0 - 0.2 %    Comment: Performed at [facility], [address] [place], [place] [address]  Comprehensive metabolic panel     Status: Abnormal   Collection Time: 01/12/20  5:50 PM  Result Value Ref Range   Sodium 139 135 - 145 mmol/L   Potassium 3.6 3.5 - 5.1 mmol/L   Chloride 101 98 - 111 mmol/L   CO2 28 22 - 32 mmol/L   Glucose, Bld 139 (H) 70 - 99 mg/dL   BUN 21 (H) 6 - 20 mg/dL   Creatinine, Ser 1.02 (H) 0.44 - 1.00 mg/dL   Calcium 9.0 8.9 - 10.3 mg/dL   Total Protein 7.3 6.5 - 8.1 g/dL   Albumin 3.4 (L) 3.5 - 5.0 g/dL   AST 32 15 - 41 U/L   ALT 43 0 - 44 U/L   Alkaline Phosphatase 105 38 - 126 U/L   Total Bilirubin 1.1 0.3 - 1.2 mg/dL   GFR calc non Af Amer >60 >60 mL/min   GFR calc Af Amer >60 >60 mL/min   Anion gap 10 5 - 15    Comment: Performed at [facility], [address] [place],  [address]  Lipid panel      Status: Abnormal   Collection Time: 04/09/20  9:18 AM  Result Value Ref Range   Cholesterol 183 <200 mg/dL   HDL 56 > OR = 50 mg/dL   Triglycerides 98 <150 mg/dL   LDL Cholesterol (Calc) 108 (H) mg/dL (  calc)    Comment: Reference range: <100 . Desirable range <100 mg/dL for primary prevention;   <70 mg/dL for patients with CHD or diabetic patients  with > or = 2 CHD risk factors. Marland Kitchen LDL-C is now calculated using the Martin-Hopkins  calculation, which is a validated novel method providing  better accuracy than the Friedewald equation in the  estimation of LDL-C.  Cresenciano Genre et al. Annamaria Helling. 1610;960(45): 2061-2068  (http://education.[facility].com/faq/FAQ164)    Total CHOL/HDL Ratio 3.3 <5.0 (calc)   Non-HDL Cholesterol (Calc) 127 <130 mg/dL (calc)    Comment: For patients with diabetes plus 1 major ASCVD risk  factor, treating to a non-HDL-C goal of <100 mg/dL  (LDL-C of <70 mg/dL) is considered a therapeutic  option.   COMPLETE METABOLIC PANEL WITH GFR     Status: Abnormal   Collection Time: 04/09/20  9:18 AM  Result Value Ref Range   Glucose, Bld 102 (H) 65 - 99 mg/dL    Comment: .            Fasting reference interval . For someone without known diabetes, a glucose value between 100 and 125 mg/dL is consistent with prediabetes and should be confirmed with a follow-up test. .    BUN 16 7 - 25 mg/dL   Creat 0.87 0.50 - 1.05 mg/dL    Comment: For patients >28 years of age, the reference limit for Creatinine is approximately 13% higher for people identified as African-American. .    GFR, Est Non African American 74 > OR = 60 mL/min/1.41m   GFR, Est African American 86 > OR = 60 mL/min/1.780m  BUN/Creatinine Ratio NOT APPLICABLE 6 - 22 (calc)   Sodium 142 135 - 146 mmol/L   Potassium 3.9 3.5 - 5.3 mmol/L   Chloride 108 98 - 110 mmol/L   CO2 26 20 - 32 mmol/L   Calcium 9.5 8.6 - 10.4 mg/dL   Total Protein 6.6 6.1 - 8.1 g/dL   Albumin 3.7 3.6 - 5.1 g/dL    Globulin 2.9 1.9 - 3.7 g/dL (calc)   AG Ratio 1.3 1.0 - 2.5 (calc)   Total Bilirubin 0.3 0.2 - 1.2 mg/dL   Alkaline phosphatase (APISO) 70 37 - 153 U/L   AST 13 10 - 35 U/L   ALT 10 6 - 29 U/L  CBC with Differential/Platelet     Status: Abnormal   Collection Time: 04/09/20  9:18 AM  Result Value Ref Range   WBC 8.3 3.8 - 10.8 Thousand/uL   RBC 4.88 3.80 - 5.10 Million/uL   Hemoglobin 12.7 11.7 - 15.5 g/dL   HCT 39.6 35.0 - 45.0 %   MCV 81.1 80.0 - 100.0 fL   MCH 26.0 (L) 27.0 - 33.0 pg   MCHC 32.1 32.0 - 36.0 g/dL   RDW 12.9 11.0 - 15.0 %   Platelets 287 140 - 400 Thousand/uL   MPV 10.8 7.5 - 12.5 fL   Neutro Abs 3,992 1,500 - 7,800 cells/uL   Lymphs Abs 2,606 850 - 3,900 cells/uL   Absolute Monocytes 606 200 - 950 cells/uL   Eosinophils Absolute 1,046 (H) 15 - 500 cells/uL   Basophils Absolute 50 0 - 200 cells/uL   Neutrophils Relative % 48.1 %   Total Lymphocyte 31.4 %   Monocytes Relative 7.3 %   Eosinophils Relative 12.6 %   Basophils Relative 0.6 %  Hemoglobin A1c     Status: Abnormal   Collection Time: 04/09/20  9:18 AM  Result Value  Ref Range   Hgb A1c MFr Bld 6.7 (H) <5.7 % of total Hgb    Comment: For someone without known diabetes, a hemoglobin A1c value of 6.5% or greater indicates that they may have  diabetes and this should be confirmed with a follow-up  test. . For someone with known diabetes, a value <7% indicates  that their diabetes is well controlled and a value  greater than or equal to 7% indicates suboptimal  control. A1c targets should be individualized based on  duration of diabetes, age, comorbid conditions, and  other considerations. . Currently, no consensus exists regarding use of hemoglobin A1c for diagnosis of diabetes for children. .    Mean Plasma Glucose 146 (calc)   eAG (mmol/L) 8.1 (calc)      Fall Risk: Fall Risk  04/11/2020 02/01/2020 01/11/2020 11/18/2019 11/04/2019  Falls in the past year? 0 0 0 0 0  Number falls in past yr:  0 0 0 0 0  Injury with Fall? 0 0 0 0 0  Follow up - Falls evaluation completed - Falls evaluation completed -     Functional Status Survey: Is the patient deaf or have difficulty hearing?: No Does the patient have difficulty seeing, even when wearing glasses/contacts?: No Does the patient have difficulty concentrating, remembering, or making decisions?: Yes Does the patient have difficulty walking or climbing stairs?: Yes Does the patient have difficulty dressing or bathing?: No Does the patient have difficulty doing errands alone such as visiting a doctor's office or shopping?: No   Assessment & Plan  1. DDD (degenerative disc disease), cervical  - DULoxetine (CYMBALTA) 30 MG capsule; Take 1-2 capsules (30-60 mg total) by mouth daily. First week take one after that two daily and call back for 40m dose  Dispense: 60 capsule; Refill: 0 - celecoxib (CELEBREX) 200 MG capsule; Take 1 capsule (200 mg total) by mouth daily.  Dispense: 90 capsule; Refill: 1  2. Benign hypertension  - lisinopril (ZESTRIL) 10 MG tablet; Take 0.5 tablets (5 mg total) by mouth daily.  Dispense: 90 tablet; Refill: 1  3. Diabetic peripheral neuropathy associated with type 2 diabetes mellitus (HCC)  - Insulin Degludec-Liraglutide (XULTOPHY) 100-3.6 UNIT-MG/ML SOPN; Inject 50 Units into the skin daily.  Dispense: 15 mL; Refill: 2 - metFORMIN (GLUCOPHAGE-XR) 750 MG 24 hr tablet; Take 2 tablets (1,500 mg total) by mouth daily with breakfast.  Dispense: 180 tablet; Refill: 1  4. Lower extremity edema   5. Depression, major, recurrent, mild (HCC)  - DULoxetine (CYMBALTA) 30 MG capsule; Take 1-2 capsules (30-60 mg total) by mouth daily. First week take one after that two daily and call back for 676mdose  Dispense: 60 capsule; Refill: 0  6. Dyslipidemia   7. Primary insomnia  - hydrOXYzine (ATARAX/VISTARIL) 10 MG tablet; Take 1 tablet (10 mg total) by mouth at bedtime.  Dispense: 30 tablet; Refill: 0  8.  Fibromyalgia  - DULoxetine (CYMBALTA) 30 MG capsule; Take 1-2 capsules (30-60 mg total) by mouth daily. First week take one after that two daily and call back for 6048mose  Dispense: 60 capsule; Refill: 0  9. Morbid obesity with BMI of 40.0-44.9, adult ([facility] the risk posed by an increased BMI. Discussed importance of portion control, calorie counting and at least 150 minutes of physical activity weekly. Avoid sweet beverages and drink more water. Eat at least 6 servings of fruit and vegetables daily   10. Hives  - hydrOXYzine (ATARAX/VISTARIL) 10 MG  tablet; Take 1 tablet (10 mg total) by mouth at bedtime.  Dispense: 30 tablet; Refill: 0  11. Well adult exam   12. GAD (generalized anxiety disorder)  - ALPRAZolam (ALPRAZOLAM XR) 0.5 MG 24 hr tablet; Take 1 tablet (0.5 mg total) by mouth daily.  Dispense: 30 tablet; Refill: 2  13. Right nasal polyps  Keep follow up with Dr. Ladene Artist  14. Bilateral primary osteoarthritis of knee  - celecoxib (CELEBREX) 200 MG capsule; Take 1 capsule (200 mg total) by mouth daily.  Dispense: 90 capsule; Refill: 1  -USPSTF grade A and B recommendations reviewed with patient; age-appropriate recommendations, preventive care, screening tests, etc discussed and encouraged; healthy living encouraged; see AVS for patient education given to patient -Discussed importance of 150 minutes of physical activity weekly, eat two servings of fish weekly, eat one serving of tree nuts ( cashews, pistachios, pecans, almonds.Marland Kitchen) every other day, eat 6 servings of fruit/vegetables daily and drink plenty of water and avoid sweet beverages.

## 2020-04-11 NOTE — Patient Instructions (Signed)

## 2020-04-16 ENCOUNTER — Encounter: Payer: Self-pay | Admitting: Urology

## 2020-04-20 DIAGNOSIS — M7542 Impingement syndrome of left shoulder: Secondary | ICD-10-CM | POA: Diagnosis not present

## 2020-04-20 DIAGNOSIS — M5030 Other cervical disc degeneration, unspecified cervical region: Secondary | ICD-10-CM | POA: Diagnosis not present

## 2020-04-20 DIAGNOSIS — J328 Other chronic sinusitis: Secondary | ICD-10-CM | POA: Diagnosis not present

## 2020-04-20 DIAGNOSIS — M5412 Radiculopathy, cervical region: Secondary | ICD-10-CM | POA: Diagnosis not present

## 2020-04-21 DIAGNOSIS — M76892 Other specified enthesopathies of left lower limb, excluding foot: Secondary | ICD-10-CM

## 2020-04-21 HISTORY — DX: Other specified enthesopathies of left lower limb, excluding foot: M76.892

## 2020-04-25 DIAGNOSIS — J301 Allergic rhinitis due to pollen: Secondary | ICD-10-CM | POA: Diagnosis not present

## 2020-04-25 DIAGNOSIS — J328 Other chronic sinusitis: Secondary | ICD-10-CM | POA: Diagnosis not present

## 2020-04-25 DIAGNOSIS — J330 Polyp of nasal cavity: Secondary | ICD-10-CM | POA: Diagnosis not present

## 2020-05-02 ENCOUNTER — Other Ambulatory Visit: Payer: Self-pay | Admitting: Family Medicine

## 2020-05-02 ENCOUNTER — Encounter: Payer: Self-pay | Admitting: Family Medicine

## 2020-05-02 DIAGNOSIS — M7542 Impingement syndrome of left shoulder: Secondary | ICD-10-CM | POA: Diagnosis not present

## 2020-05-02 DIAGNOSIS — M50322 Other cervical disc degeneration at C5-C6 level: Secondary | ICD-10-CM | POA: Diagnosis not present

## 2020-05-02 DIAGNOSIS — M62838 Other muscle spasm: Secondary | ICD-10-CM | POA: Diagnosis not present

## 2020-05-02 DIAGNOSIS — M25512 Pain in left shoulder: Secondary | ICD-10-CM | POA: Diagnosis not present

## 2020-05-03 ENCOUNTER — Encounter: Payer: Self-pay | Admitting: Family Medicine

## 2020-05-03 ENCOUNTER — Other Ambulatory Visit: Payer: Self-pay | Admitting: Family Medicine

## 2020-05-03 ENCOUNTER — Telehealth: Payer: Self-pay

## 2020-05-03 MED ORDER — PREGABALIN 50 MG PO CAPS
50.0000 mg | ORAL_CAPSULE | Freq: Three times a day (TID) | ORAL | 0 refills | Status: DC
Start: 2020-05-03 — End: 2020-06-15

## 2020-05-03 NOTE — Telephone Encounter (Signed)
Copied from [place] [phone]. Topic: General - Other >> May 03, 2020 10:10 AM Yvette Rack wrote: Reason for CRM: Pt stated she was taking pregabalin but stopped and now she would like to start back taking it at a lower dosage than the pregabalin (LYRICA) 75 MG capsule. Pt request new Rx for pregabalin (LYRICA) to be sent to [place], [place]

## 2020-05-09 DIAGNOSIS — M170 Bilateral primary osteoarthritis of knee: Secondary | ICD-10-CM | POA: Diagnosis not present

## 2020-05-09 DIAGNOSIS — Z6841 Body Mass Index (BMI) 40.0 and over, adult: Secondary | ICD-10-CM | POA: Diagnosis not present

## 2020-05-11 ENCOUNTER — Encounter: Admission: RE | Admit: 2020-05-11 | Payer: BC Managed Care – PPO | Source: Ambulatory Visit

## 2020-05-11 HISTORY — DX: Myoneural disorder, unspecified: G70.9

## 2020-05-11 HISTORY — DX: Other cervical disc degeneration, unspecified cervical region: M50.30

## 2020-05-11 HISTORY — DX: Fibromyalgia: M79.7

## 2020-05-11 HISTORY — DX: Nasal polyp, unspecified: J33.9

## 2020-05-11 HISTORY — DX: Obesity, unspecified: E66.9

## 2020-05-11 HISTORY — DX: Chronic sinusitis, unspecified: J32.9

## 2020-05-11 HISTORY — DX: Insomnia, unspecified: G47.00

## 2020-05-11 HISTORY — DX: Hyperlipidemia, unspecified: E78.5

## 2020-05-11 NOTE — Pre-Procedure Instructions (Signed)
Called [facility] and spoke with Josh in Clinical. Informed the office that I have attempted to reach out to this patient several times today without any luck.  I have left several voice messages on her generic machine.  Office is aware and confirmed the only active phone number for this patient. Patient had already been informed as to when she needs to arrive for her Covid testing.

## 2020-05-14 ENCOUNTER — Other Ambulatory Visit: Payer: Self-pay

## 2020-05-14 ENCOUNTER — Other Ambulatory Visit
Admission: RE | Admit: 2020-05-14 | Discharge: 2020-05-14 | Disposition: A | Discharge status: Home / Self Care | Payer: BC Managed Care – PPO | Source: Ambulatory Visit | Attending: Otolaryngology | Admitting: Otolaryngology

## 2020-05-14 DIAGNOSIS — Z20822 Contact with and (suspected) exposure to covid-19: Secondary | ICD-10-CM | POA: Insufficient documentation

## 2020-05-14 DIAGNOSIS — Z01812 Encounter for preprocedural laboratory examination: Secondary | ICD-10-CM | POA: Diagnosis not present

## 2020-05-14 HISTORY — DX: Gastro-esophageal reflux disease without esophagitis: K21.9

## 2020-05-14 LAB — SARS CORONAVIRUS 2 (TAT 6-24 HRS): SARS Coronavirus 2: NEGATIVE

## 2020-05-15 ENCOUNTER — Encounter
Admission: RE | Admit: 2020-05-15 | Discharge: 2020-05-15 | Disposition: A | Discharge status: Home / Self Care | Payer: BC Managed Care – PPO | Source: Ambulatory Visit | Attending: Otolaryngology | Admitting: Otolaryngology

## 2020-05-15 ENCOUNTER — Encounter: Payer: Self-pay | Admitting: Family Medicine

## 2020-05-15 ENCOUNTER — Other Ambulatory Visit: Payer: Self-pay

## 2020-05-15 NOTE — Patient Instructions (Signed)
Your procedure is scheduled on: Wednesday 05/16/20.  Report to DAY SURGERY DEPARTMENT LOCATED ON 2ND FLOOR MEDICAL MALL ENTRANCE. To find out your arrival time please call [phone] between 1PM - 3PM on Tuesday 05/15/20.   Remember: Instructions that are not followed completely may result in serious medical risk, up to and including death, or upon the discretion of your surgeon and anesthesiologist your surgery may need to be rescheduled.      _X__ 1. Do not eat food after midnight the night before your procedure.                 No gum chewing or hard candies. You may drink SUGAR FREE clear liquids up to 2 hours                 before you are scheduled to arrive for your surgery- DO NOT drink clear                 liquids within 2 hours of the start of your surgery.                   __X__2.  On the morning of surgery brush your teeth with toothpaste and water, you may rinse your mouth with mouthwash if you wish.  Do not swallow any toothpaste or mouthwash.      _X__ 3.  No Alcohol for 24 hours before or after surgery.    _X__ 4.  Do Not Smoke or use e-cigarettes For 24 Hours Prior to Your Surgery.                 Do not use any chewable tobacco products for at least 6 hours prior to                 surgery.   __X__6.  Notify your doctor if there is any change in your medical condition      (cold, fever, infections).     Do not wear jewelry, make-up, hairpins, clips or nail polish. Do not wear lotions, powders, or perfumes.  Do not shave 48 hours prior to surgery. Men may shave face and neck. Do not bring valuables to the hospital.     [facility] is not responsible for any belongings or valuables.   Contacts, dentures/partials or body piercings may not be worn into surgery. Bring a case for your contacts, glasses or hearing aids, a denture cup will be supplied.    Patients discharged the day of surgery will not be allowed to drive home.     __X__ Take these medicines  the morning of surgery with A SIP OF WATER:     1. ALPRAZolam (ALPRAZOLAM XR)  2. cetirizine (ZYRTEC)  3. fluticasone (FLONASE)      __X__ Use CHG Soap as directed    __X__ Stop Metformin 2 days prior to surgery.      __X__ Take 1/2 of usual insulin dose the night before surgery. No insulin the morning          of surgery.    __X__ Stop Blood Thinners: Aspirin. You reported that you already stopped taking Aspirin in preparation for your surgery.    __X__ Stop Anti-inflammatories 7 days before surgery such as Advil, Ibuprofen, Motrin, BC or Goodies Powder, Naprosyn, Naproxen, Aleve, Aspirin, Meloxicam. May take Tylenol if needed for pain or discomfort.     __X__ Don't start taking any new herbal supplements or vitamins prior to your procedure.

## 2020-05-16 ENCOUNTER — Encounter: Payer: Self-pay | Admitting: Otolaryngology

## 2020-05-16 ENCOUNTER — Encounter
Admission: RE | Disposition: A | Discharge status: Home / Self Care | Payer: Self-pay | Source: Home / Self Care | Attending: Otolaryngology

## 2020-05-16 ENCOUNTER — Ambulatory Visit
Admission: RE | Admit: 2020-05-16 | Discharge: 2020-05-16 | Disposition: A | Discharge status: Home / Self Care | Payer: BC Managed Care – PPO | Attending: Otolaryngology | Admitting: Otolaryngology

## 2020-05-16 ENCOUNTER — Other Ambulatory Visit: Payer: Self-pay | Admitting: Family Medicine

## 2020-05-16 ENCOUNTER — Ambulatory Visit: Payer: BC Managed Care – PPO | Admitting: Anesthesiology

## 2020-05-16 ENCOUNTER — Other Ambulatory Visit: Payer: BC Managed Care – PPO

## 2020-05-16 DIAGNOSIS — Z791 Long term (current) use of non-steroidal anti-inflammatories (NSAID): Secondary | ICD-10-CM | POA: Diagnosis not present

## 2020-05-16 DIAGNOSIS — J320 Chronic maxillary sinusitis: Secondary | ICD-10-CM | POA: Diagnosis not present

## 2020-05-16 DIAGNOSIS — F419 Anxiety disorder, unspecified: Secondary | ICD-10-CM | POA: Insufficient documentation

## 2020-05-16 DIAGNOSIS — Z7982 Long term (current) use of aspirin: Secondary | ICD-10-CM | POA: Diagnosis not present

## 2020-05-16 DIAGNOSIS — I10 Essential (primary) hypertension: Secondary | ICD-10-CM | POA: Diagnosis not present

## 2020-05-16 DIAGNOSIS — D1039 Benign neoplasm of other parts of mouth: Secondary | ICD-10-CM | POA: Diagnosis not present

## 2020-05-16 DIAGNOSIS — Z6841 Body Mass Index (BMI) 40.0 and over, adult: Secondary | ICD-10-CM | POA: Diagnosis not present

## 2020-05-16 DIAGNOSIS — K219 Gastro-esophageal reflux disease without esophagitis: Secondary | ICD-10-CM | POA: Diagnosis not present

## 2020-05-16 DIAGNOSIS — F329 Major depressive disorder, single episode, unspecified: Secondary | ICD-10-CM | POA: Diagnosis not present

## 2020-05-16 DIAGNOSIS — J321 Chronic frontal sinusitis: Secondary | ICD-10-CM | POA: Diagnosis not present

## 2020-05-16 DIAGNOSIS — Z794 Long term (current) use of insulin: Secondary | ICD-10-CM | POA: Insufficient documentation

## 2020-05-16 DIAGNOSIS — J322 Chronic ethmoidal sinusitis: Secondary | ICD-10-CM | POA: Diagnosis not present

## 2020-05-16 DIAGNOSIS — Z79899 Other long term (current) drug therapy: Secondary | ICD-10-CM | POA: Insufficient documentation

## 2020-05-16 DIAGNOSIS — E1140 Type 2 diabetes mellitus with diabetic neuropathy, unspecified: Secondary | ICD-10-CM | POA: Diagnosis not present

## 2020-05-16 DIAGNOSIS — J329 Chronic sinusitis, unspecified: Secondary | ICD-10-CM | POA: Insufficient documentation

## 2020-05-16 DIAGNOSIS — J323 Chronic sphenoidal sinusitis: Secondary | ICD-10-CM | POA: Diagnosis not present

## 2020-05-16 DIAGNOSIS — J339 Nasal polyp, unspecified: Secondary | ICD-10-CM | POA: Diagnosis not present

## 2020-05-16 DIAGNOSIS — J324 Chronic pansinusitis: Secondary | ICD-10-CM | POA: Diagnosis not present

## 2020-05-16 HISTORY — PX: EXCISION ORAL TUMOR: SHX6265

## 2020-05-16 HISTORY — PX: FRONTAL SINUS EXPLORATION: SHX6591

## 2020-05-16 HISTORY — PX: SPHENOIDECTOMY: SHX2421

## 2020-05-16 HISTORY — PX: IMAGE GUIDED SINUS SURGERY: SHX6570

## 2020-05-16 HISTORY — PX: MAXILLARY ANTROSTOMY: SHX2003

## 2020-05-16 HISTORY — PX: ETHMOIDECTOMY: SHX5197

## 2020-05-16 LAB — GLUCOSE, CAPILLARY
Glucose-Capillary: 144 mg/dL — ABNORMAL HIGH (ref 70–99)
Glucose-Capillary: 158 mg/dL — ABNORMAL HIGH (ref 70–99)

## 2020-05-16 SURGERY — SINUS SURGERY, WITH IMAGING GUIDANCE
Anesthesia: General | Laterality: Right

## 2020-05-16 MED ORDER — CEFAZOLIN SODIUM-DEXTROSE 2-4 GM/100ML-% IV SOLN
2.0000 g | Freq: Once | INTRAVENOUS | Status: AC
  Administered 2020-05-16: 2 g via INTRAVENOUS

## 2020-05-16 MED ORDER — OXYMETAZOLINE HCL 0.05 % NA SOLN
NASAL | Status: AC
  Administered 2020-05-16: 2 via NASAL
  Filled 2020-05-16: qty 30

## 2020-05-16 MED ORDER — LEVOFLOXACIN 500 MG PO TABS
500.0000 mg | ORAL_TABLET | Freq: Every day | ORAL | 0 refills | Status: AC
Start: 2020-05-16 — End: 2020-05-26

## 2020-05-16 MED ORDER — ACETAMINOPHEN 10 MG/ML IV SOLN: 1000.0000 mg | Freq: Once | INTRAVENOUS | Status: DC | PRN

## 2020-05-16 MED ORDER — ROCURONIUM BROMIDE 100 MG/10ML IV SOLN
INTRAVENOUS | Status: DC | PRN
  Administered 2020-05-16: 20 mg via INTRAVENOUS

## 2020-05-16 MED ORDER — ORAL CARE MOUTH RINSE: 15.0000 mL | Freq: Once | OROMUCOSAL | Status: AC

## 2020-05-16 MED ORDER — MIDAZOLAM HCL 2 MG/2ML IJ SOLN
INTRAMUSCULAR | Status: DC | PRN
  Administered 2020-05-16: 2 mg via INTRAVENOUS

## 2020-05-16 MED ORDER — PREDNISONE 10 MG PO TABS
ORAL_TABLET | ORAL | 0 refills | Status: DC
Start: 2020-05-16 — End: 2020-06-13

## 2020-05-16 MED ORDER — LIDOCAINE HCL (CARDIAC) PF 100 MG/5ML IV SOSY
PREFILLED_SYRINGE | INTRAVENOUS | Status: DC | PRN
  Administered 2020-05-16: 100 mg via INTRAVENOUS

## 2020-05-16 MED ORDER — PROPOFOL 500 MG/50ML IV EMUL
INTRAVENOUS | Status: AC
  Filled 2020-05-16: qty 50

## 2020-05-16 MED ORDER — SUCCINYLCHOLINE CHLORIDE 200 MG/10ML IV SOSY
PREFILLED_SYRINGE | INTRAVENOUS | Status: AC
  Filled 2020-05-16: qty 10

## 2020-05-16 MED ORDER — OXYMETAZOLINE HCL 0.05 % NA SOLN: 2.0000 | Freq: Once | NASAL | Status: AC

## 2020-05-16 MED ORDER — SUGAMMADEX SODIUM 200 MG/2ML IV SOLN
INTRAVENOUS | Status: DC | PRN
  Administered 2020-05-16: 240 mg via INTRAVENOUS

## 2020-05-16 MED ORDER — ROCURONIUM BROMIDE 10 MG/ML (PF) SYRINGE
PREFILLED_SYRINGE | INTRAVENOUS | Status: AC
  Filled 2020-05-16: qty 10

## 2020-05-16 MED ORDER — SODIUM CHLORIDE 0.9 % IV SOLN: INTRAVENOUS | Status: DC

## 2020-05-16 MED ORDER — FAMOTIDINE 20 MG PO TABS
ORAL_TABLET | ORAL | Status: AC
  Filled 2020-05-16: qty 1

## 2020-05-16 MED ORDER — CEFAZOLIN SODIUM-DEXTROSE 2-4 GM/100ML-% IV SOLN
INTRAVENOUS | Status: AC
  Filled 2020-05-16: qty 100

## 2020-05-16 MED ORDER — HYDROCODONE-ACETAMINOPHEN 5-325 MG PO TABS: 1.0000 | ORAL_TABLET | Freq: Four times a day (QID) | ORAL | 0 refills | Status: AC | PRN

## 2020-05-16 MED ORDER — FENTANYL CITRATE (PF) 100 MCG/2ML IJ SOLN
INTRAMUSCULAR | Status: AC
  Filled 2020-05-16: qty 2

## 2020-05-16 MED ORDER — DEXAMETHASONE SODIUM PHOSPHATE 10 MG/ML IJ SOLN
INTRAMUSCULAR | Status: DC | PRN
  Administered 2020-05-16: 5 mg via INTRAVENOUS

## 2020-05-16 MED ORDER — SUCCINYLCHOLINE CHLORIDE 20 MG/ML IJ SOLN
INTRAMUSCULAR | Status: DC | PRN
  Administered 2020-05-16: 140 mg via INTRAVENOUS

## 2020-05-16 MED ORDER — OXYCODONE HCL 5 MG PO TABS
ORAL_TABLET | ORAL | Status: AC
  Filled 2020-05-16: qty 1

## 2020-05-16 MED ORDER — PROPOFOL 10 MG/ML IV BOLUS
INTRAVENOUS | Status: DC | PRN
  Administered 2020-05-16: 200 mg via INTRAVENOUS

## 2020-05-16 MED ORDER — PHENYLEPHRINE HCL (PRESSORS) 10 MG/ML IV SOLN
INTRAVENOUS | Status: AC
  Filled 2020-05-16: qty 1

## 2020-05-16 MED ORDER — FENTANYL CITRATE (PF) 100 MCG/2ML IJ SOLN
INTRAMUSCULAR | Status: DC | PRN
  Administered 2020-05-16: 50 ug via INTRAVENOUS

## 2020-05-16 MED ORDER — LIDOCAINE-EPINEPHRINE (PF) 1 %-1:200000 IJ SOLN
INTRAMUSCULAR | Status: AC
  Filled 2020-05-16: qty 30

## 2020-05-16 MED ORDER — OXYCODONE HCL 5 MG/5ML PO SOLN: 5.0000 mg | Freq: Once | ORAL | Status: AC | PRN

## 2020-05-16 MED ORDER — MIDAZOLAM HCL 2 MG/2ML IJ SOLN
INTRAMUSCULAR | Status: AC
  Filled 2020-05-16: qty 2

## 2020-05-16 MED ORDER — ONDANSETRON HCL 4 MG/2ML IJ SOLN
INTRAMUSCULAR | Status: DC | PRN
  Administered 2020-05-16: 4 mg via INTRAVENOUS

## 2020-05-16 MED ORDER — LIDOCAINE HCL (PF) 4 % IJ SOLN
INTRAMUSCULAR | Status: AC
  Filled 2020-05-16: qty 20

## 2020-05-16 MED ORDER — ONDANSETRON HCL 4 MG/2ML IJ SOLN: 4.0000 mg | Freq: Once | INTRAMUSCULAR | Status: DC | PRN

## 2020-05-16 MED ORDER — CHLORHEXIDINE GLUCONATE 0.12 % MT SOLN: 15.0000 mL | Freq: Once | OROMUCOSAL | Status: AC

## 2020-05-16 MED ORDER — FENTANYL CITRATE (PF) 100 MCG/2ML IJ SOLN: 25.0000 ug | INTRAMUSCULAR | Status: DC | PRN

## 2020-05-16 MED ORDER — REMIFENTANIL HCL 1 MG IV SOLR
INTRAVENOUS | Status: DC | PRN
  Administered 2020-05-16: .05 ug/kg/min via INTRAVENOUS

## 2020-05-16 MED ORDER — SODIUM CHLORIDE 0.9 % IV SOLN
INTRAVENOUS | Status: DC | PRN
  Administered 2020-05-16: 35 ug/min via INTRAVENOUS

## 2020-05-16 MED ORDER — REMIFENTANIL HCL 1 MG IV SOLR
INTRAVENOUS | Status: AC
  Filled 2020-05-16: qty 1000

## 2020-05-16 MED ORDER — DEXAMETHASONE SODIUM PHOSPHATE 10 MG/ML IJ SOLN
INTRAMUSCULAR | Status: AC
  Filled 2020-05-16: qty 1

## 2020-05-16 MED ORDER — CHLORHEXIDINE GLUCONATE 0.12 % MT SOLN
OROMUCOSAL | Status: AC
  Administered 2020-05-16: 15 mL via OROMUCOSAL
  Filled 2020-05-16: qty 15

## 2020-05-16 MED ORDER — ARTIFICIAL TEARS OPHTHALMIC OINT
TOPICAL_OINTMENT | OPHTHALMIC | Status: AC
  Filled 2020-05-16: qty 3.5

## 2020-05-16 MED ORDER — FAMOTIDINE 20 MG PO TABS
20.0000 mg | ORAL_TABLET | Freq: Once | ORAL | Status: AC
  Administered 2020-05-16: 20 mg via ORAL

## 2020-05-16 MED ORDER — LIDOCAINE-EPINEPHRINE (PF) 1 %-1:200000 IJ SOLN
INTRAMUSCULAR | Status: DC | PRN
  Administered 2020-05-16: 1 mL

## 2020-05-16 MED ORDER — EPHEDRINE SULFATE 50 MG/ML IJ SOLN
INTRAMUSCULAR | Status: DC | PRN
  Administered 2020-05-16: 10 mg via INTRAVENOUS

## 2020-05-16 MED ORDER — PHENYLEPHRINE HCL 10 % OP SOLN
OPHTHALMIC | Status: DC | PRN
  Administered 2020-05-16: 20 mL via TOPICAL

## 2020-05-16 MED ORDER — OXYCODONE HCL 5 MG PO TABS
5.0000 mg | ORAL_TABLET | Freq: Once | ORAL | Status: AC | PRN
  Administered 2020-05-16: 5 mg via ORAL

## 2020-05-16 MED ORDER — PHENYLEPHRINE HCL (PRESSORS) 10 MG/ML IV SOLN
INTRAVENOUS | Status: DC | PRN
  Administered 2020-05-16: 100 ug via INTRAVENOUS

## 2020-05-16 MED ORDER — HEMOSTATIC AGENTS (NO CHARGE) OPTIME
TOPICAL | Status: DC | PRN
  Administered 2020-05-16 (×2): 1 via TOPICAL

## 2020-05-16 MED ORDER — ONDANSETRON HCL 4 MG/2ML IJ SOLN
INTRAMUSCULAR | Status: AC
  Filled 2020-05-16: qty 2

## 2020-05-16 SURGICAL SUPPLY — 68 items
BALLN SINUPLASTY KIT 6X16 (BALLOONS)
BALLOON SINUPLASTY KIT 6X16 (BALLOONS) IMPLANT
BATTERY INSTRU NAVIGATION (MISCELLANEOUS) ×8 IMPLANT
BLADE SURG 15 STRL LF DISP TIS (BLADE) ×3 IMPLANT
BLADE SURG 15 STRL SS (BLADE) ×1
CANISTER SUC SOCK COL 7IN (MISCELLANEOUS) IMPLANT
CANISTER SUCT 1200ML W/VALVE (MISCELLANEOUS) ×4 IMPLANT
CANISTER SUCT 3000ML PPV (MISCELLANEOUS) ×4 IMPLANT
CNTNR SPEC 2.5X3XGRAD LEK (MISCELLANEOUS) ×6
COAG SUCT 10F 3.5MM HAND CTRL (MISCELLANEOUS) ×4 IMPLANT
CONT SPEC 4OZ STER OR WHT (MISCELLANEOUS) ×2
CONTAINER SPEC 2.5X3XGRAD LEK (MISCELLANEOUS) ×6 IMPLANT
CORD BIP STRL DISP 12FT (MISCELLANEOUS) IMPLANT
COVER WAND RF STERILE (DRAPES) ×4 IMPLANT
DEVICE INFLATION SEID (MISCELLANEOUS) IMPLANT
DRAPE MAG INST 16X20 L/F (DRAPES) IMPLANT
DRESSING NASL FOAM PST OP SINU (MISCELLANEOUS) IMPLANT
DRSG NASAL FOAM POST OP SINU (MISCELLANEOUS)
DRSG TEGADERM 2-3/8X2-3/4 SM (GAUZE/BANDAGES/DRESSINGS) IMPLANT
DRSG TEGADERM 4X4.75 (GAUZE/BANDAGES/DRESSINGS) IMPLANT
DRSG TELFA 4X3 1S NADH ST (GAUZE/BANDAGES/DRESSINGS) ×4 IMPLANT
ELECT CAUTERY BLADE TIP 2.5 (TIP)
ELECT NEEDLE 20X.3 GREEN (MISCELLANEOUS)
ELECT REM PT RETURN 9FT ADLT (ELECTROSURGICAL) ×4
ELECTRODE CAUTERY BLDE TIP 2.5 (TIP) IMPLANT
ELECTRODE NEEDLE 20X.3 GREEN (MISCELLANEOUS) IMPLANT
ELECTRODE REM PT RTRN 9FT ADLT (ELECTROSURGICAL) ×3 IMPLANT
GAUZE 4X4 16PLY RFD (DISPOSABLE) ×4 IMPLANT
GAUZE PACK 2X3YD (GAUZE/BANDAGES/DRESSINGS) ×4 IMPLANT
GLOVE BIO SURGEON STRL SZ7.5 (GLOVE) ×8 IMPLANT
GLOVE PROTEXIS LATEX SZ 7.5 (GLOVE) ×4 IMPLANT
GOWN STRL REUS W/ TWL LRG LVL3 (GOWN DISPOSABLE) ×6 IMPLANT
GOWN STRL REUS W/TWL LRG LVL3 (GOWN DISPOSABLE) ×2
IV NS 500ML (IV SOLUTION) ×1
IV NS 500ML BAXH (IV SOLUTION) ×3 IMPLANT
KIT TURNOVER KIT A (KITS) ×4 IMPLANT
LABEL OR SOLS (LABEL) ×4 IMPLANT
NEEDLE ANESTHESIA  27G X 3.5 (NEEDLE) ×1
NEEDLE ANESTHESIA 27G X 3.5 (NEEDLE) ×3 IMPLANT
NS IRRIG 500ML POUR BTL (IV SOLUTION) ×4 IMPLANT
PACK HEAD/NECK (MISCELLANEOUS) ×4 IMPLANT
PACKING NASAL EPIS 4X2.4 XEROG (MISCELLANEOUS) ×8 IMPLANT
PATTIES SURGICAL .5 X3 (DISPOSABLE) ×8 IMPLANT
PROBE MONO 100X0.75 ELECT 1.9M (MISCELLANEOUS) IMPLANT
PROBE NEUROSIGN BIPOL (MISCELLANEOUS) IMPLANT
PROBE NEUROSIGN BIPOLAR (MISCELLANEOUS)
SHAVER DIEGO BLD STD TYPE A (BLADE) ×4 IMPLANT
SHEARS HARMONIC 9CM CVD (BLADE) IMPLANT
SOL ANTI-FOG 6CC FOG-OUT (MISCELLANEOUS) ×3 IMPLANT
SOL FOG-OUT ANTI-FOG 6CC (MISCELLANEOUS) ×1
SPLINT NASAL REUTER .5MM (MISCELLANEOUS) IMPLANT
SPLINT NASAL REUTER .5MM BIVLV (MISCELLANEOUS) IMPLANT
SPONGE KITTNER 5P (MISCELLANEOUS) ×4 IMPLANT
SUT CHROMIC 4 0 RB 1X27 (SUTURE) IMPLANT
SUT ETHILON 3 0 PS 1 (SUTURE) IMPLANT
SUT ETHILON 5-0 FS-2 18 BLK (SUTURE) IMPLANT
SUT ETHILON 6 0 9-3 1X18 BLK (SUTURE) IMPLANT
SUT SILK 2 0 (SUTURE)
SUT SILK 2-0 18XBRD TIE 12 (SUTURE) IMPLANT
SUT SILK 3 0 REEL (SUTURE) IMPLANT
SUT VIC AB 4-0 PS2 18 (SUTURE) IMPLANT
SUT VIC AB 4-0 RB1 18 (SUTURE) IMPLANT
SWAB CULTURE AMIES ANAERIB BLU (MISCELLANEOUS) IMPLANT
SYR 30ML LL (SYRINGE) ×4 IMPLANT
TRACKER CRANIALMASK (MASK) ×4 IMPLANT
TUBING CONNECTING 10 (TUBING) ×4 IMPLANT
TUBING DECLOG MULTIDEBRIDER (TUBING) ×4 IMPLANT
WATER STERILE IRR 1000ML POUR (IV SOLUTION) IMPLANT

## 2020-05-16 NOTE — Anesthesia Postprocedure Evaluation (Signed)
Anesthesia Post Note  Patient: Lindsay Mcdonald  Procedure(s) Performed: IMAGE GUIDED SINUS SURGERY (N/A ) SPHENOIDECTOMY (Right ) ETHMOIDECTOMY (Right ) MAXILLARY ANTROSTOMY with tissue removal (Bilateral ) FRONTAL SINUS EXPLORATION (Right ) EXCISION OF SOFT PALATE PAPILLOMA (N/A )  Patient location during evaluation: PACU Anesthesia Type: General Level of consciousness: awake and alert Pain management: pain level controlled Vital Signs Assessment: post-procedure vital signs reviewed and stable Respiratory status: spontaneous breathing, nonlabored ventilation, respiratory function stable and patient connected to nasal cannula oxygen Cardiovascular status: blood pressure returned to baseline and stable Postop Assessment: no apparent nausea or vomiting Anesthetic complications: no     Last Vitals:  Vitals:   05/16/20 1100 05/16/20 1109  BP:  (!) 126/54  Pulse: 98 99  Resp: (!) 22 (!) 22  Temp:    SpO2: 97% 95%    Last Pain:  Vitals:   05/16/20 1100  TempSrc:   PainSc: Asleep                 Arita Miss

## 2020-05-16 NOTE — Transfer of Care (Signed)
Immediate Anesthesia Transfer of Care Note  Patient: Lindsay Mcdonald  Procedure(s) Performed: IMAGE GUIDED SINUS SURGERY (N/A ) SPHENOIDECTOMY (Right ) ETHMOIDECTOMY (Right ) MAXILLARY ANTROSTOMY with tissue removal (Bilateral ) FRONTAL SINUS EXPLORATION (Right ) EXCISION OF SOFT PALATE PAPILLOMA (N/A )  Patient Location: PACU  Anesthesia Type:General  Level of Consciousness: awake and alert   Airway & Oxygen Therapy: Patient Spontanous Breathing and Patient connected to face mask oxygen  Post-op Assessment: Report given to RN and Post -op Vital signs reviewed and stable  Post vital signs: Reviewed and stable  Last Vitals:  Vitals Value Taken Time  BP 128/61 05/16/20 1009  Temp 36 C 05/16/20 1009  Pulse 95 05/16/20 1014  Resp 19 05/16/20 1014  SpO2 100 % 05/16/20 1014  Vitals shown include unvalidated device data.  Last Pain:  Vitals:   05/16/20 1009  TempSrc:   PainSc: 0-No pain         Complications: No apparent anesthesia complications

## 2020-05-16 NOTE — Discharge Instructions (Addendum)
Endoscopic Ethmoidectomy and Antrostomy, Care After This sheet gives you information about how to care for yourself after your procedure. Your health care provider may also give you more specific instructions. If you have problems or questions, contact your health care provider. What can I expect after the procedure? After the procedure, it is common to have:  Mild pain.  A stuffy nose (nasal congestion).  Some bloody drainage from your nose. Follow these instructions at home: Medicines  Take over-the-counter and prescription medicines only as told by your health care provider.  If you were prescribed an antibiotic medicine, take it as told by your health care provider. Do not stop taking the antibiotic even if you start to feel better.  Do not drive or use heavy machinery while taking prescription pain medicine. Nose care   If you have a gauze pad bandage (dressing) under your nose, keep this dressing clean. Change it if it gets soaked with nasal drainage.  Check your nasal dressing often for signs of thick discharge, more blood, or more discharge of clear fluid.  Follow instructions from your health care provider about using saline nasal sprays or washing out (irrigating) your nasal cavity. Managing pain and swelling  Keep your head raised (elevated) when you are lying down. You can use a pillow to do this.  If directed, put ice on the affected area: ? Put ice in a plastic bag. ? Place a towel between your skin and the bag. ? Leave the ice on for 20 minutes, 2-3 times a day. Activity  Return to your normal activities as told by your health care provider. Ask your health care provider what activities are safe for you.  Do not lift anything that is heavier than 10 lb (4.5 kg) until your health care provider says that it is safe.  Do not bend over until your health care provider says that it is safe.  Ask your health care provider when it is safe for you to  drive.  Avoid these activities as directed by your health care provider: ? Blowing your nose. ? Coughing or sneezing. If you cannot stop a sneeze, sneeze with your mouth open. ? Straining on the toilet. General instructions  Do not use hot water when taking a bath or shower. Do not swim or use a hot tub until your health care provider approves.  Do not use any products that contain nicotine or tobacco, such as cigarettes and e-cigarettes. These products can delay healing. If you need help quitting, ask your health care provider.  To prevent or treat constipation while you are taking prescription pain medicine, your health care provider may recommend that you: ? Drink enough fluid to keep your urine pale yellow. ? Take over-the-counter or prescription medicines. ? Eat foods that are high in fiber, such as fresh fruits and vegetables, whole grains, and beans. ? Limit foods that are high in fat and processed sugars, such as fried and sweet foods.  Keep all follow-up visits as told by your health care provider. This is important. You will need to return to see your surgeon about 1 week after the procedure. Contact a health care provider if:  You have: ? Chills or a fever. ? Thick, bloody, or watery discharge from your nose. ? A headache or stiff neck. ? Any change in vision.  Your pain is not controlled by your pain medicine.  You are not able to breathe through your nose after your packing is  removed. Get help right away if:  You have heavy bleeding from your nose.  You have any loss of vision. Summary  After the procedure, it is common to have mild pain, a stuffy nose (nasal congestion), and some bloody drainage from your nose.  Take over-the-counter and prescription medicines only as told by your health care provider. Follow instructions from your health care provider about using saline nasal sprays or washing out (irrigating) your nasal cavity.  Ask your health care provider  what activities are safe for you.  Do not use hot water when taking a bath or shower. Do not swim or use a hot tub until your health care provider approves. This information is not intended to replace advice given to you by your health care provider. Make sure you discuss any questions you have with your health care provider. Document Revised: 11/20/2017 Document Reviewed: 05/25/2017 Elsevier Patient Education  2020 Effort   1) The drugs that you were given will stay in your system until tomorrow so for the next 24 hours you should not:  A) Drive an automobile B) Make any legal decisions C) Drink any alcoholic beverage   2) You may resume regular meals tomorrow.  Today it is better to start with liquids and gradually work up to solid foods.  You may eat anything you prefer, but it is better to start with liquids, then soup and crackers, and gradually work up to solid foods.   3) Please notify your doctor immediately if you have any unusual bleeding, trouble breathing, redness and pain at the surgery site, drainage, fever, or pain not relieved by medication.    4) Additional Instructions:        Please contact your physician with any problems or Same Day Surgery at [phone], Monday through Friday 6 am to 4 pm, or [facility] number at [phone].

## 2020-05-16 NOTE — Op Note (Signed)
05/16/2020  9:58 AM    Lindsay Mcdonald  [id number]   Pre-Op Dx: Chronic recurrent sinusitis bilaterally  Post-op Dx: Chronic recurrent sinusitis bilaterally with scar tissue and thick inspissated mucus, papilloma of the uvula.  Proc: Left endoscopic maxillary antrostomy revision with removal of contents, right endoscopic total ethmoidectomy with frontal sinusotomy, right endoscopic sphenoid ectomy with removal of tissue, right endoscopic maxillary antrostomy revision, use of image guided system.  Excision papilloma of the uvula.  Surg:  Lindsay Mcdonald  Anes:  GOT  EBL: 150 mL  Comp: None  Findings: Previous sinus surgery with partial ethmoidectomies and partial maxillary antrostomies on both sides.  The right frontal sinus and ethmoid sinus were blocked and filled with inspissated mucus.  Procedure: The patient was brought to the operating room and placed in the supine position.  She was given general anesthesia by oral endotracheal intubation.  Once she was asleep the nose was prepped with cottonoid pledgets soaked in phenylephrine and Xylocaine as per routine.  The image guided system was brought in and the CT scan was downloaded from the disc to the system.  The template was then applied to her face and this was registered to the system as well.  There was 0.6 mm of variance.  Suction instruments were registered and they appeared aligned to begin with and then posteriorly they were off by about 1/4 inch.  The patient was reregistered again at 0.6 mm of variance and the alignment was correct.  She was prepped and draped in a sterile fashion and the cotton pledgets were removed.  A Shaune Pascal was used to open up the oropharynx.  The uvula was normal size but at the distal tip of the uvula was a small papilloma.  This papilloma was grasped with a hemostat on the distal uvula just above the papilloma.  Scissors were then used to cut the uvula above the hemostat, and the distal uvula  and papilloma were then removed with the hemostat.  There was no bleeding here and a little bit electrocautery was used at the tip to make sure that this would heal well.  The left side of the nose was addressed first with opening of the left maxillary antrum.  She had previous antrostomy that had trimmed some of the upper portion of the turbinate but had not fully remove the uncinate process.  The uncinate was removed with backbiting forceps and with a 90 degree forcep.  This helped widened and clear the maxillary antrum and make sure there was no recycling through the natural ostium that had not been included in the previous antrostomy.  There is some thick mucus inside the maxillary sinus and swollen membranes superiorly and inferiorly.  These were all trimmed using the microdebrider to clean out the left maxillary sinus.  The 30 and 70 degree scopes were used to visualize distally and any mucus was cleared out there is no further debris here.  A cottonoid pledget was placed her temporarily while the right side was addressed.  The 0 degree scope was used first to visualize the area he could see the maxillary antrum had a lot of thick tissue at the posterior and inferior border of the maxillary antrum.  This all was trimmed and cleaned out to create a wider maxillary antrostomy and allow for better drainage.  The 30 and 70 degree scope were used to visualize the rest of the maxillary sinus and this appeared to be cleaner inside.  There was very thickened  tissue on both sides of the sphenoid sinus opening and evidence of thick yellow mucin that was just rolling over the opening of the sphenoid sinus and into the posterior pharynx.  This was suctioned clear.  The middle ethmoid air cells were all scarred down.  There was a retained uncinate process here that needed to be removed and blockage to the frontal sinus duct.  Using the 0, 30, 45, and 70 degrees scopes the different areas were open.  The ethmoid was  opened first be able to find the landmarks.  There was retained tissue along the lamina papyracea that was cleaned off to clean the lateral wall of the ethmoid sinus.  The middle turbinate was found anteriorly of a part of the posterior middle turbinate was missing.  The 45 degree scope was used to visualize and open the anterior ethmoid air cells.  The image guided system was necessary for evaluating the depth of dissection and make sure that I was following along the fovea ethmoidalis.  A medial opening into an anterior ethmoid cell was found but more laterally the opening to the frontal sinus was found.  Using the 45 and 70 degree scopes this opening was widened with the frontal sinus instruments.  This created a good opening into the frontal sinus that I could easily get into with the suction instruments.  Cottonoid pledget was placed here temporarily to help control bleeding in this area.  The sphenoid sinus was then addressed and using a microdebrider a lot of the swollen soft tissue around its opening was trimmed away.  Some of this was along the posterior septum and had to be trimmed down.  Electrocautery was used here to help control bleeding along the posterior septum.  Laterally there is a lot of soft tissue at the opening to the sphenoid sinus that was all cleaned away.  Bleeding was controlled here with electrocautery as well.  The sphenoid sinus had a lot of mucin that was cleaned out earlier.  Putting a scope right into the sphenoid sinus she could see there was a inferior lateral leg to the sinus that was still filled with some yellowish chunks of mucus.  A curved suction was placed here to suction out this entire area to make sure it was completely cleared.  The anterior wall was cleaned off Solman and cleaned laterally as well to allow better drainage in this area.  I could see that this was now cleaned out and there is a good opening at the anterior wall so that this would drain properly.  Cotton  pledgets were placed here temporarily to help control bleeding.  The cotton pledget was removed on the left side and it was revisualized with the 0 and 45 degree scopes.  The left maxillary antrum was now widely open and the natural ostium was included in the antrostomy.  There is still some scar tissue at the anterior ethmoid area but there was an opening of the frontal sinus and this was draining properly without infection here so this area was not addressed.  Xerogel was then placed into the opening of the maxillary antrum and covering over some of the raw areas.  The airway was left open otherwise.  The right side was then visualized with the 0 degree scope and the cotton pledgets were removed.  Bleeding had settled down nicely so that the maxillary antrum was widely open and clear.  Sphenoid sinus was open and the inferior lateral sphenoid sinus area was clear.  Xerogel was placed down into the sphenoid sinus opening.  The ethmoid was clear and the fovea ethmoidalis was smooth going anteriorly.  The anterior ethmoid air cells were open and there was a good opening into the frontal sinus.  Xerogel was placed at the anterior ethmoids and opening of the frontal sinus.  More xerogel was then placed into the middle ethmoid air cells and along the maxillary antral opening.  The patient tolerated the procedure well.  She was awakened and taken to the recovery room in satisfactory condition.  There were no operative complications.  Dispo:   To PACU to be discharged home    Plan: To follow-up in the office in 6 days.  She will rest at home for the next 4 days.  She will start saline flushes tomorrow to help wash out the xerogel.  She will begin a small prednisone taper tomorrow along with antibiotics as preventative.  She will call the office if she has any questions or problems.  She can slowly increase her diet as tolerated.  Lindsay Mcdonald Lindsay Mcdonald   05/16/2020 9:58 AM

## 2020-05-16 NOTE — Anesthesia Preprocedure Evaluation (Signed)
Anesthesia Evaluation  Patient identified by MRN, date of birth, ID band Patient awake    Reviewed: Allergy & Precautions, NPO status , Patient's Chart, lab work & pertinent test results  History of Anesthesia Complications Negative for: history of anesthetic complications  Airway Mallampati: III  TM Distance: >3 FB Neck ROM: Full    Dental no notable dental hx. (+) Teeth Intact, Dental Advisory Given   Pulmonary neg pulmonary ROS, neg sleep apnea, neg COPD, Patient abstained from smoking.Not current smoker,    Pulmonary exam normal breath sounds clear to auscultation       Cardiovascular Exercise Tolerance: Good METShypertension, (-) CAD and (-) Past MI (-) dysrhythmias  Rhythm:Regular Rate:Normal - Systolic murmurs    Neuro/Psych PSYCHIATRIC DISORDERS Anxiety Depression negative neurological ROS     GI/Hepatic GERD  Controlled,(+)     (-) substance abuse  ,   Endo/Other  diabetesMorbid obesity  Renal/GU negative Renal ROS     Musculoskeletal  (+) Arthritis , Fibromyalgia -  Abdominal   Peds  Hematology   Anesthesia Other Findings Past Medical History: No date: Anxiety 03/2020: Arthritis     Comment:  osteoarthritis both knees No date: Chronic pain of right knee No date: Chronic sinusitis No date: DDD (degenerative disc disease), cervical     Comment:  cervical radiculopathy No date: Depression No date: Diabetes mellitus     Comment:  insulin dependant No date: Fibromyalgia No date: GERD (gastroesophageal reflux disease) No date: High cholesterol No date: Hyperlipidemia No date: Hypertension No date: Insomnia No date: Muscle pain No date: Nasal polyp No date: Neuromuscular disorder (HCC)     Comment:  diabetic neuropathy No date: Obesity No date: Palpitations 04/2020: Tendonitis of knee, left     Comment:  received steroid injection No date: Vitamin D deficiency No date: Vitamin D deficiency  Reproductive/Obstetrics                             Anesthesia Physical Anesthesia Plan  ASA: III  Anesthesia Plan: General   Post-op Pain Management:    Induction: Intravenous  PONV Risk Score and Plan: 4 or greater and Ondansetron, Dexamethasone and Midazolam  Airway Management Planned: Oral ETT  Additional Equipment: None  Intra-op Plan:   Post-operative Plan: Extubation in OR  Informed Consent: I have reviewed the patients History and Physical, chart, labs and discussed the procedure including the risks, benefits and alternatives for the proposed anesthesia with the patient or authorized representative who has indicated his/her understanding and acceptance.     Dental advisory given  Plan Discussed with: CRNA and Surgeon  Anesthesia Plan Comments: (Discussed risks of anesthesia with patient, including PONV, sore throat, lip/dental damage. Rare risks discussed as well, such as cardiorespiratory and neurological sequelae. Patient understands.)        Anesthesia Quick Evaluation

## 2020-05-16 NOTE — H&P (Signed)
H&P has been reviewed and patient reevaluated, no changes necessary. To be downloaded later.  

## 2020-05-16 NOTE — Anesthesia Procedure Notes (Signed)
Procedure Name: Intubation Performed by: Fredderick Phenix, CRNA Pre-anesthesia Checklist: Patient identified, Emergency Drugs available, Suction available and Patient being monitored Patient Re-evaluated:Patient Re-evaluated prior to induction Oxygen Delivery Method: Circle system utilized Preoxygenation: Pre-oxygenation with 100% oxygen Induction Type: IV induction Ventilation: Mask ventilation without difficulty Laryngoscope Size: Mac and 4 Grade View: Grade II Tube type: Oral Tube size: 7.0 mm Number of attempts: 1 Airway Equipment and Method: Stylet and Oral airway Placement Confirmation: ETT inserted through vocal cords under direct vision,  positive ETCO2 and breath sounds checked- equal and bilateral Secured at: 1 cm Tube secured with: Tape Dental Injury: Teeth and Oropharynx as per pre-operative assessment

## 2020-05-17 LAB — SURGICAL PATHOLOGY

## 2020-05-22 DIAGNOSIS — Z48813 Encounter for surgical aftercare following surgery on the respiratory system: Secondary | ICD-10-CM | POA: Diagnosis not present

## 2020-05-28 ENCOUNTER — Encounter: Payer: Self-pay | Admitting: Family Medicine

## 2020-05-30 ENCOUNTER — Encounter: Payer: Self-pay | Admitting: Family Medicine

## 2020-06-01 DIAGNOSIS — Z48813 Encounter for surgical aftercare following surgery on the respiratory system: Secondary | ICD-10-CM | POA: Diagnosis not present

## 2020-06-05 ENCOUNTER — Encounter: Payer: Self-pay | Admitting: Family Medicine

## 2020-06-06 DIAGNOSIS — M5441 Lumbago with sciatica, right side: Secondary | ICD-10-CM | POA: Diagnosis not present

## 2020-06-06 DIAGNOSIS — M25512 Pain in left shoulder: Secondary | ICD-10-CM | POA: Diagnosis not present

## 2020-06-06 DIAGNOSIS — M7542 Impingement syndrome of left shoulder: Secondary | ICD-10-CM | POA: Diagnosis not present

## 2020-06-06 DIAGNOSIS — G8929 Other chronic pain: Secondary | ICD-10-CM | POA: Diagnosis not present

## 2020-06-07 ENCOUNTER — Other Ambulatory Visit: Payer: Self-pay | Admitting: Sports Medicine

## 2020-06-07 DIAGNOSIS — M25512 Pain in left shoulder: Secondary | ICD-10-CM

## 2020-06-07 DIAGNOSIS — M7542 Impingement syndrome of left shoulder: Secondary | ICD-10-CM

## 2020-06-07 DIAGNOSIS — G8929 Other chronic pain: Secondary | ICD-10-CM

## 2020-06-11 ENCOUNTER — Ambulatory Visit
Admission: RE | Admit: 2020-06-11 | Discharge: 2020-06-11 | Disposition: A | Discharge status: Home / Self Care | Payer: BC Managed Care – PPO | Source: Ambulatory Visit | Attending: Sports Medicine | Admitting: Sports Medicine

## 2020-06-11 ENCOUNTER — Other Ambulatory Visit: Payer: Self-pay

## 2020-06-11 DIAGNOSIS — M75102 Unspecified rotator cuff tear or rupture of left shoulder, not specified as traumatic: Secondary | ICD-10-CM | POA: Diagnosis not present

## 2020-06-11 DIAGNOSIS — M7542 Impingement syndrome of left shoulder: Secondary | ICD-10-CM | POA: Diagnosis not present

## 2020-06-11 DIAGNOSIS — G8929 Other chronic pain: Secondary | ICD-10-CM | POA: Diagnosis not present

## 2020-06-11 DIAGNOSIS — M25512 Pain in left shoulder: Secondary | ICD-10-CM | POA: Diagnosis not present

## 2020-06-11 DIAGNOSIS — M25412 Effusion, left shoulder: Secondary | ICD-10-CM | POA: Diagnosis not present

## 2020-06-11 IMAGING — MR MR SHOULDER*L* W/O CM
5 series · 33 of 40 positions shown · non-contrast
Comparison: Radiographs dated [DATE]

CLINICAL DATA: Chronic left shoulder pain.  Impingement syndrome.

EXAM:
MRI OF THE LEFT SHOULDER WITHOUT CONTRAST
TECHNIQUE: Multiplanar, multisequence MR imaging of the shoulder was performed.
No intravenous contrast was administered.

[Series 5: PD fat-sat · axial · left · 4.0mm · 0.55mm/px · z∈[-112,+13]mm · 8 of 28 slices shown (1 of 2)]
[im 1/28]
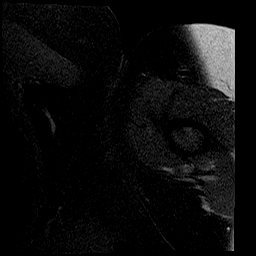
[im 4/28]
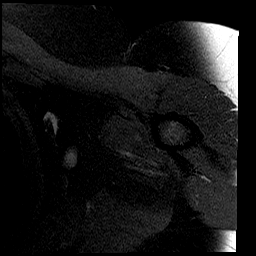
[im 10/28]
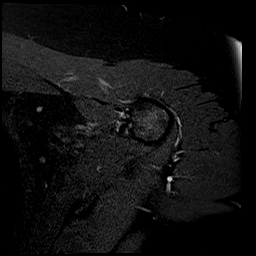
[im 13/28]
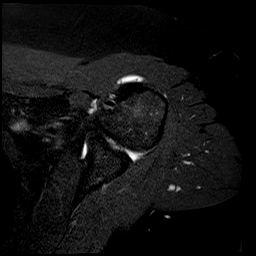
[im 16/28]
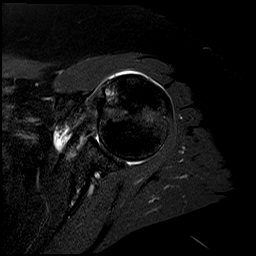
[im 19/28]
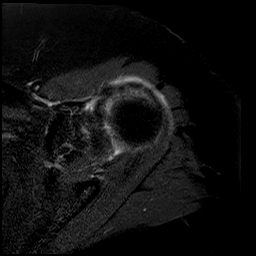
[im 25/28]
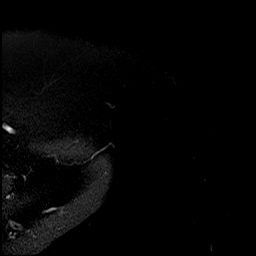
[im 28/28]
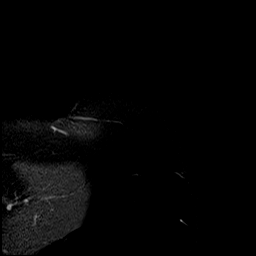

[Series 6: PD fat-sat · oblique · left · 4.0mm · 0.44mm/px · 8 of 26 slices shown (2 of 2)]
[im 1/26]
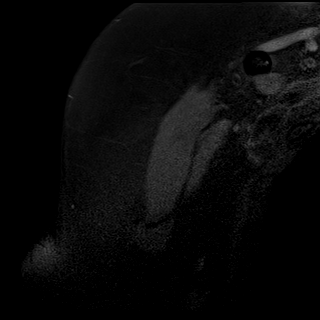
[im 4/26]
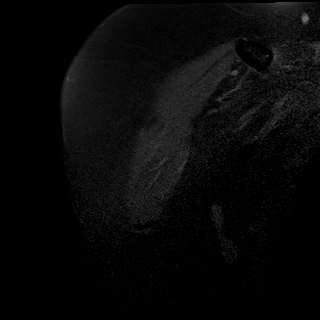
[im 8/26]
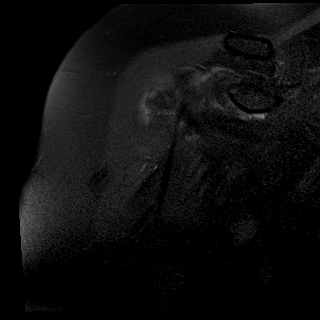
[im 11/26]
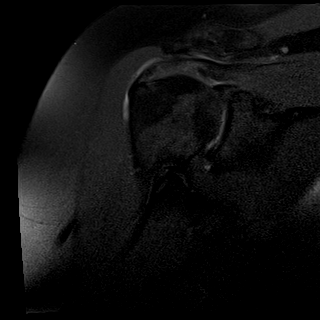
[im 15/26]
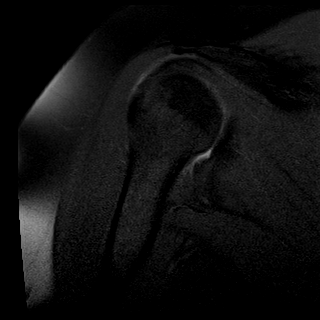
[im 18/26]
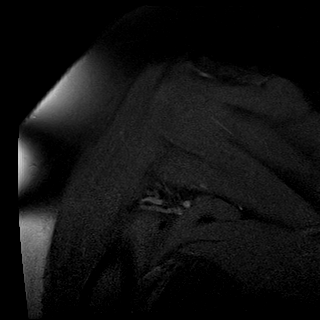
[im 22/26]
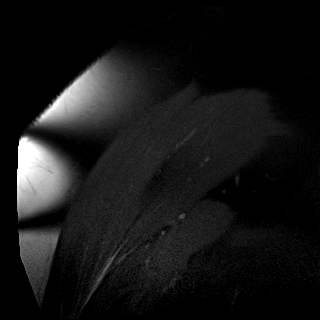
[im 26/26]
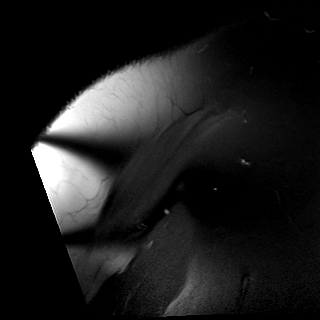

[Series 7: T2 fat-sat · oblique · left · 4.0mm · 0.44mm/px · 8 of 26 slices shown (1 of 2)]
[im 1/26]
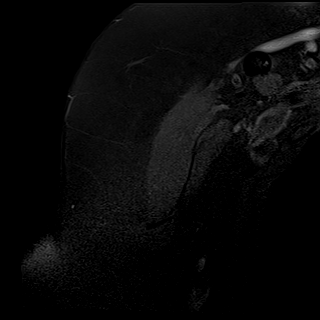
[im 4/26]
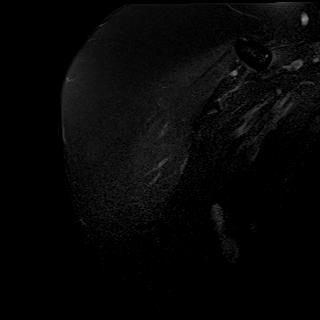
[im 8/26]
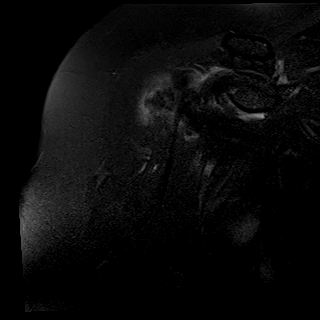
[im 11/26]
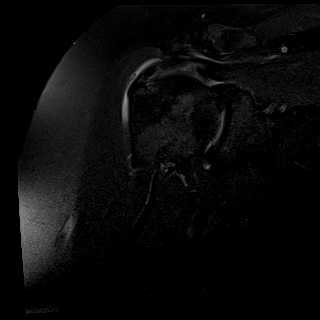
[im 15/26]
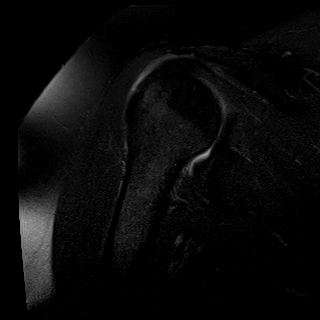
[im 18/26]
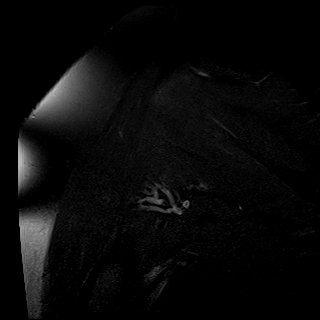
[im 22/26]
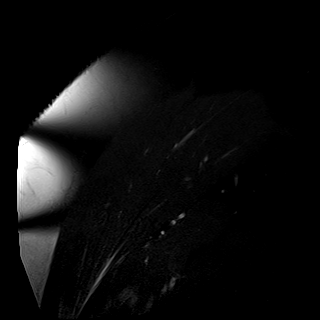
[im 26/26]
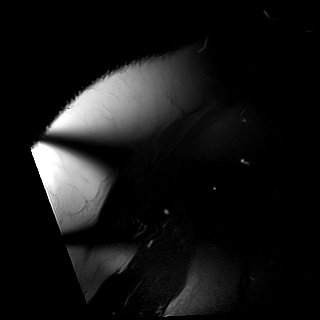

[Series 8: T2 fat-sat · oblique · left · 4.0mm · 0.23mm/px · 7 of 22 slices shown (2 of 2)]
[im 1/22]
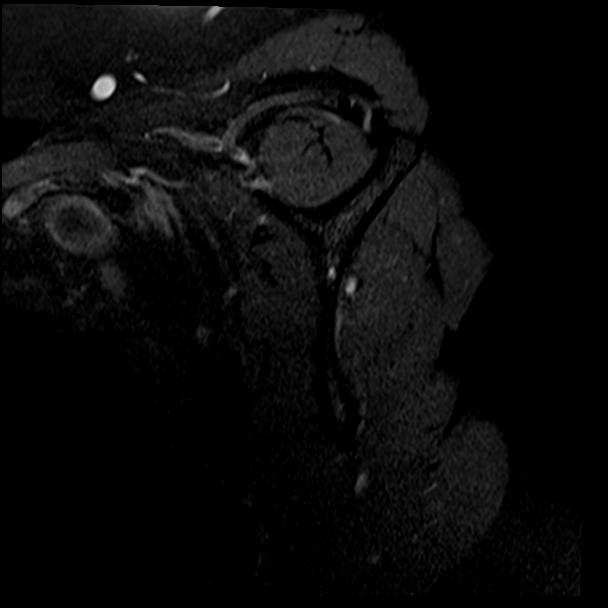
[im 4/22]
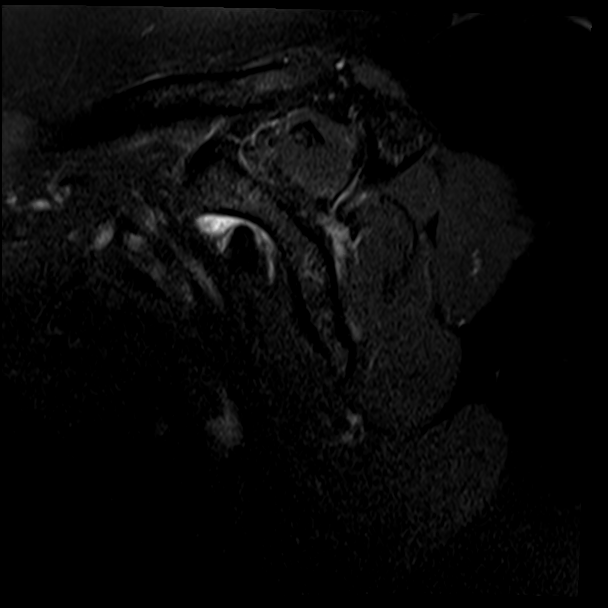
[im 8/22]
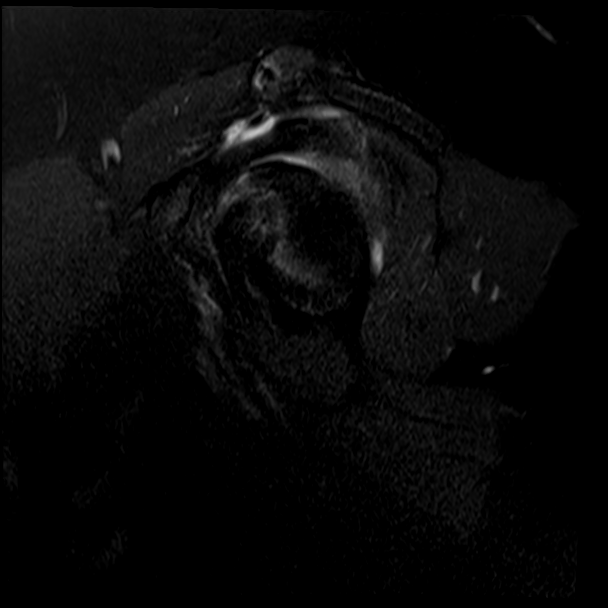
[im 11/22]
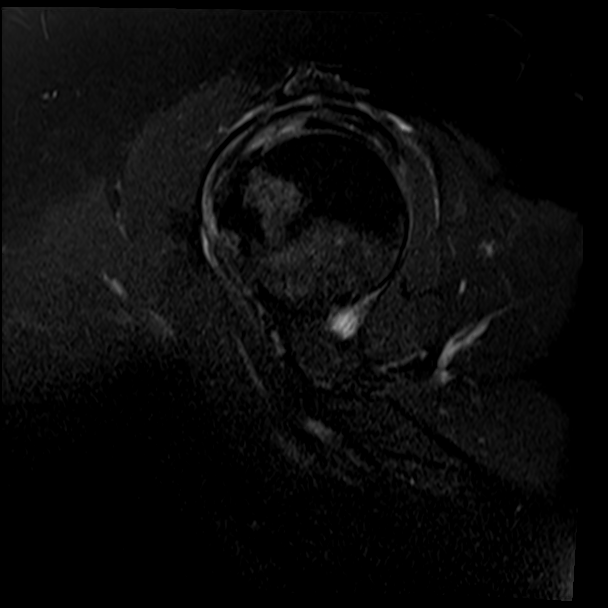
[im 15/22]
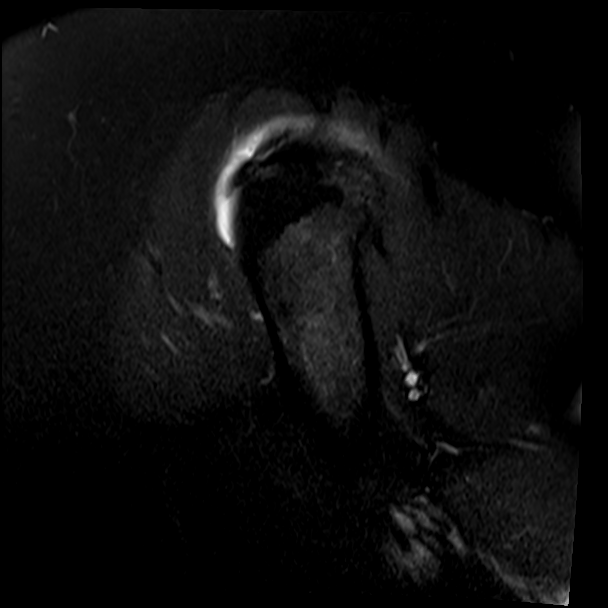
[im 18/22]
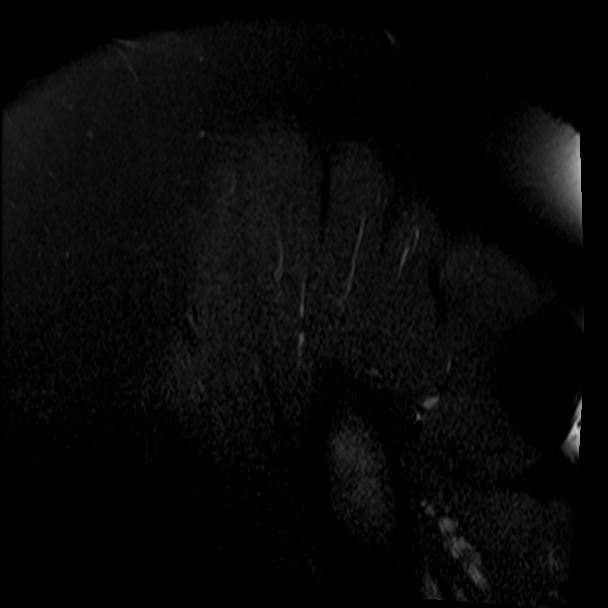
[im 22/22]
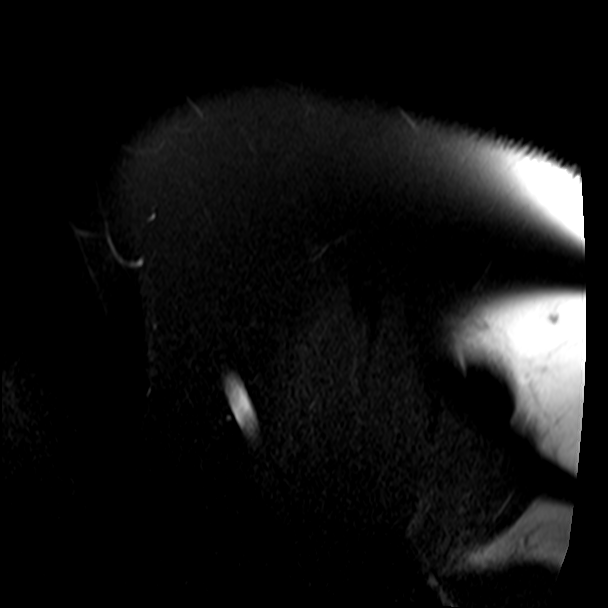

[Series 9: T1 · oblique · left · 4.0mm · 0.36mm/px · 2 of 22 slices shown]
[im 1/22]
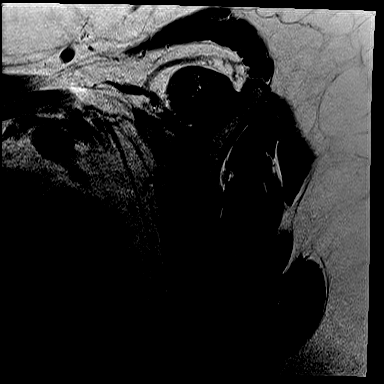
[im 4/22]
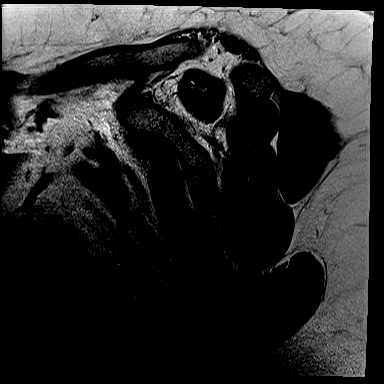

[33 of 40 positions shown; findings below may reference images not displayed]

FINDINGS: Rotator cuff: There is a small focal full-thickness non retracted
tear of the anterior aspect of the distal supraspinatus tendon. The
remainder of the rotator cuff is normal.

Muscles: No atrophy or abnormal signal of the muscles of the rotator
cuff.

Biceps long head:  Properly located and intact.

Acromioclavicular Joint:  Normal.  Type 2 acromion.

Glenohumeral Joint: Small glenohumeral joint effusion. No chondral
defect.

Labrum:  Intact.

Bones:  Normal.

Other: None
IMPRESSION: 1. Small focal full-thickness non retracted tear of the anterior
aspect of the distal supraspinatus tendon.
2. Small glenohumeral joint effusion.

## 2020-06-12 DIAGNOSIS — Z48813 Encounter for surgical aftercare following surgery on the respiratory system: Secondary | ICD-10-CM | POA: Diagnosis not present

## 2020-06-12 DIAGNOSIS — J301 Allergic rhinitis due to pollen: Secondary | ICD-10-CM | POA: Diagnosis not present

## 2020-06-13 ENCOUNTER — Ambulatory Visit: Payer: BC Managed Care – PPO | Admitting: Family Medicine

## 2020-06-13 ENCOUNTER — Other Ambulatory Visit: Payer: Self-pay

## 2020-06-13 ENCOUNTER — Encounter: Payer: Self-pay | Admitting: Family Medicine

## 2020-06-13 VITALS — BP 120/90 | HR 102 | Temp 96.8°F | Resp 16 | Ht 64.0 in | Wt 272.7 lb

## 2020-06-13 DIAGNOSIS — F5101 Primary insomnia: Secondary | ICD-10-CM | POA: Diagnosis not present

## 2020-06-13 DIAGNOSIS — F411 Generalized anxiety disorder: Secondary | ICD-10-CM

## 2020-06-13 DIAGNOSIS — F320 Major depressive disorder, single episode, mild: Secondary | ICD-10-CM

## 2020-06-13 DIAGNOSIS — M170 Bilateral primary osteoarthritis of knee: Secondary | ICD-10-CM | POA: Diagnosis not present

## 2020-06-13 DIAGNOSIS — I10 Essential (primary) hypertension: Secondary | ICD-10-CM

## 2020-06-13 DIAGNOSIS — M75122 Complete rotator cuff tear or rupture of left shoulder, not specified as traumatic: Secondary | ICD-10-CM

## 2020-06-13 MED ORDER — LISINOPRIL 10 MG PO TABS: 10.0000 mg | ORAL_TABLET | Freq: Every day | ORAL | 0 refills | Status: DC

## 2020-06-13 NOTE — Progress Notes (Signed)
Name: Lindsay Mcdonald   MRN: [id number]    DOB: 1961/05/07   Date:06/13/2020       Progress Note  Subjective  Chief Complaint  Chief Complaint  Patient presents with  . Depression  . DDD  . Hypertension    HPI  Left shoulder: seen by Ortho and had an MRI that showed a tear of the anterior aspect of the distal supraspinatus tendon, seen by Dr. Candelaria Stagers and has follow up next week.   Mild Major Depression/insomnia: she is still not taking duloxetine, afraid of side effects. Explained Duloxetine is safer than BZD. She is tired of having pain but excited that she is going back to [facility] for information management/ medical records. She takes Melatonin and camomile tea and is helping her sleep well at night She has a long history of depression. She noticed it after she had children and was a single parent   OA: seeing Dr. Candelaria Stagers and had steroid injections on both knees and has helped with pain on knees, no effusion or redness, she has some pain on right popliteal fossa, she has a bakers cyst and is going back next week to see. [place]   HTN: today DBP is a little high, she states eating a high salt diet and will try to improve her diet. Denies chest pain or palpitation   Patient Active Problem List   Diagnosis Date Noted  . History of marijuana use 03/30/2019  . Fibromyalgia 12/31/2018  . Venous vascular malformations 03/27/2016  . Diabetic peripheral neuropathy associated with type 2 diabetes mellitus ([place]) 09/26/2015  . Non compliance w medication regimen 09/26/2015  . Seasonal allergic rhinitis 09/26/2015  . Muscle spasms of neck 06/19/2015  . Insomnia 06/19/2015  . Primary osteoarthritis of right knee 06/17/2015  . Depression, major, recurrent, mild ([place]) 06/17/2015  . Diabetes mellitus type 2, uncontrolled ([place]) 06/17/2015  . Dyslipidemia 06/17/2015  . Benign hypertension 06/17/2015  . Morbid obesity with BMI of 40.0-44.9, adult ([place]) 06/17/2015  . Vitamin D deficiency 06/17/2015     Past Surgical History:  Procedure Laterality Date  . BREAST REDUCTION SURGERY    . ETHMOIDECTOMY Right 05/16/2020   Procedure: ETHMOIDECTOMY;  Surgeon: Margaretha Sheffield, MD;  Location: [facility] ORS;  Service: ENT;  Laterality: Right;  . EXCISION ORAL TUMOR N/A 05/16/2020   Procedure: EXCISION OF SOFT PALATE PAPILLOMA;  Surgeon: Margaretha Sheffield, MD;  Location: [facility] ORS;  Service: ENT;  Laterality: N/A;  . FRONTAL SINUS EXPLORATION Right 05/16/2020   Procedure: FRONTAL SINUS EXPLORATION;  Surgeon: Margaretha Sheffield, MD;  Location: [facility] OR[facility];  Service: ENT;  Laterality: Right;  . IMAGE GUIDED SINUS SURGERY N/A 05/16/2020   Procedure: IMAGE GUIDED SINUS SURGERY;  Surgeon: Margaretha Sheffield, MD;  Location: [facility] ORS;  Service: ENT;  Laterality: N/A;  . MAXILLARY ANTROSTOMY Bilateral 05/16/2020   Procedure: MAXILLARY ANTROSTOMY with tissue removal;  Surgeon: Margaretha Sheffield, MD;  Location: [facility] ORS;  Service: ENT;  Laterality: Bilateral;  . nasal endoscopic    . OOPHORECTOMY  ?   one ovary removed   . REDUCTION MAMMAPLASTY Bilateral 2000  . removal of ovary    . SPHENOIDECTOMY Right 05/16/2020   Procedure: SPHENOIDECTOMY;  Surgeon: Margaretha Sheffield, MD;  Location: [facility] ORS;  Service: ENT;  Laterality: Right;    Family History  Problem Relation Age of Onset  . Diabetes Mother   . Cancer Mother 74       Breast  . Breast cancer Mother 36  . Gout Mother   .  Diabetes Daughter        Oldest Daughter  . Stroke Maternal Uncle   . Breast cancer Maternal Aunt 17  . Diabetes Father   . Kidney cancer Maternal Aunt   . Kidney disease Maternal Aunt     Social History   Tobacco Use  . Smoking status: Never Smoker  . Smokeless tobacco: Never Used  Substance Use Topics  . Alcohol use: No    Alcohol/week: 0.0 standard drinks     Current Outpatient Medications:  .  ALPRAZolam (ALPRAZOLAM XR) 0.5 MG 24 hr tablet, Take 1 tablet (0.5 mg total) by mouth daily., Disp: 30 tablet, Rfl: 2 .  celecoxib (CELEBREX) 200 MG  capsule, Take 1 capsule (200 mg total) by mouth daily., Disp: 90 capsule, Rfl: 1 .  DULoxetine (CYMBALTA) 30 MG capsule, Take 1-2 capsules (30-60 mg total) by mouth daily. First week take one after that two daily and call back for 60mg  dose, Disp: 60 capsule, Rfl: 0 .  fluticasone (FLONASE) 50 MCG/ACT nasal spray, Place 2 sprays into both nostrils daily., Disp: 16 g, Rfl: 6 .  glucose blood test strip, Use as instructed, Disp: 100 each, Rfl: 12 .  Insulin Degludec-Liraglutide (XULTOPHY) 100-3.6 UNIT-MG/ML SOPN, Inject 50 Units into the skin daily., Disp: 15 mL, Rfl: 2 .  Insulin Pen Needle 32G X 6 MM MISC, 1 each by Does not apply route daily., Disp: 100 each, Rfl: 1 .  lisinopril (ZESTRIL) 10 MG tablet, Take 0.5 tablets (5 mg total) by mouth daily. (Patient taking differently: Take 10 mg by mouth daily. ), Disp: 90 tablet, Rfl: 1 .  Melatonin 10 MG TABS, Take 10 mg by mouth at bedtime as needed (sleep). , Disp: , Rfl:  .  metFORMIN (GLUCOPHAGE-XR) 750 MG 24 hr tablet, Take 2 tablets (1,500 mg total) by mouth daily with breakfast. (Patient taking differently: Take 750 mg by mouth 2 (two) times daily. ), Disp: 180 tablet, Rfl: 1 .  Multiple Vitamin (MULITIVITAMIN WITH MINERALS) TABS, Take 1 tablet by mouth daily.  , Disp: , Rfl:  .  pravastatin (PRAVACHOL) 20 MG tablet, TAKE 1 TABLET BY MOUTH THREE TIMES A WEEK (Patient taking differently: Take 20 mg by mouth 3 (three) times a week. ), Disp: 36 tablet, Rfl: 3 .  pregabalin (LYRICA) 50 MG capsule, Take 1 capsule (50 mg total) by mouth 3 (three) times daily., Disp: 90 capsule, Rfl: 0 .  Simethicone (GAS-X PO), Take 2 tablets by mouth daily as needed (gas)., Disp: , Rfl:  .  Tetrahydrozoline HCl (VISINE OP), Place 1 drop into both eyes daily as needed (redness/irritation)., Disp: , Rfl:   Allergies  Allergen Reactions  . Baclofen Other (See Comments)    Spasms/Takes as needed  . Simvastatin     Muscle cramps   . Atorvastatin Other (See Comments)     muscle cramps  . Compazine Other (See Comments)    Twitching and spasms of neck muscles  . Penicillins Rash    Reaction under 25    I personally reviewed active problem list, medication list, allergies, family history, social history with the patient/caregiver today.   ROS  Constitutional: Negative for fever or weight change.  Respiratory: Negative for cough and shortness of breath.   Cardiovascular: Negative for chest pain or palpitations.  Gastrointestinal: Negative for abdominal pain, no bowel changes.  Musculoskeletal: Negative for gait problem or joint swelling.  Skin: Negative for rash.  Neurological: Negative for dizziness or headache.  No other specific  complaints in a complete review of systems (except as listed in HPI above).  Objective  Vitals:   06/13/20 1443  BP: 120/90  Pulse: (!) 102  Resp: 16  Temp: (!) 96.8 F (36 C)  TempSrc: Temporal  SpO2: 98%  Weight: 272 lb 11.2 oz (123.7 kg)  Height: 5\' 4"  (1.626 m)    Body mass index is 46.81 kg/m.  Physical Exam  Constitutional: Patient appears well-developed and well-nourished. Obese  No distress.  HEENT: head atraumatic, normocephalic, pupils equal and reactive to light, neck supple Cardiovascular: Normal rate, regular rhythm and normal heart sounds.  No murmur heard. No BLE edema. Pulmonary/Chest: Effort normal and breath sounds normal. No respiratory distress. Muscular Skeletal: pain with rom of left shoulder, no effusion of knee, crepitus during extension  Abdominal: Soft.  There is no tenderness. Psychiatric: Patient has a normal mood and affect. behavior is normal. Judgment and thought content normal.  Recent Results (from the past 2160 hour(s))  Lipid panel     Status: Abnormal   Collection Time: 04/09/20  9:18 AM  Result Value Ref Range   Cholesterol 183 <200 mg/dL   HDL 56 > OR = 50 mg/dL   Triglycerides 98 <150 mg/dL   LDL Cholesterol (Calc) 108 (H) mg/dL (calc)    Comment: Reference range:  <100 . Desirable range <100 mg/dL for primary prevention;   <70 mg/dL for patients with CHD or diabetic patients  with > or = 2 CHD risk factors. Marland Kitchen LDL-C is now calculated using the Martin-Hopkins  calculation, which is a validated novel method providing  better accuracy than the Friedewald equation in the  estimation of LDL-C.  Cresenciano Genre et al. Annamaria Helling. 9211;941(74): 2061-2068  (http://education.Quest[employer].com/faq/FAQ164)    Total CHOL/HDL Ratio 3.3 <5.0 (calc)   Non-HDL Cholesterol (Calc) 127 <130 mg/dL (calc)    Comment: For patients with diabetes plus 1 major ASCVD risk  factor, treating to a non-HDL-C goal of <100 mg/dL  (LDL-C of <70 mg/dL) is considered a therapeutic  option.   COMPLETE METABOLIC PANEL WITH GFR     Status: Abnormal   Collection Time: 04/09/20  9:18 AM  Result Value Ref Range   Glucose, Bld 102 (H) 65 - 99 mg/dL    Comment: .            Fasting reference interval . For someone without known diabetes, a glucose value between 100 and 125 mg/dL is consistent with prediabetes and should be confirmed with a follow-up test. .    BUN 16 7 - 25 mg/dL   Creat 0.87 0.50 - 1.05 mg/dL    Comment: For patients >32 years of age, the reference limit for Creatinine is approximately 13% higher for people identified as African-American. .    GFR, Est Non African American 74 > OR = 60 mL/min/1.69m2   GFR, Est African American 86 > OR = 60 mL/min/1.31m2   BUN/Creatinine Ratio NOT APPLICABLE 6 - 22 (calc)   Sodium 142 135 - 146 mmol/L   Potassium 3.9 3.5 - 5.3 mmol/L   Chloride 108 98 - 110 mmol/L   CO2 26 20 - 32 mmol/L   Calcium 9.5 8.6 - 10.4 mg/dL   Total Protein 6.6 6.1 - 8.1 g/dL   Albumin 3.7 3.6 - 5.1 g/dL   Globulin 2.9 1.9 - 3.7 g/dL (calc)   AG Ratio 1.3 1.0 - 2.5 (calc)   Total Bilirubin 0.3 0.2 - 1.2 mg/dL   Alkaline phosphatase (APISO) 70 37 - 153  U/L   AST 13 10 - 35 U/L   ALT 10 6 - 29 U/L  CBC with Differential/Platelet     Status:  Abnormal   Collection Time: 04/09/20  9:18 AM  Result Value Ref Range   WBC 8.3 3.8 - 10.8 Thousand/uL   RBC 4.88 3.80 - 5.10 Million/uL   Hemoglobin 12.7 11.7 - 15.5 g/dL   HCT 39.6 35 - 45 %   MCV 81.1 80.0 - 100.0 fL   MCH 26.0 (L) 27.0 - 33.0 pg   MCHC 32.1 32.0 - 36.0 g/dL   RDW 12.9 11.0 - 15.0 %   Platelets 287 140 - 400 Thousand/uL   MPV 10.8 7.5 - 12.5 fL   Neutro Abs 3,992 1,500 - 7,800 cells/uL   Lymphs Abs 2,606 850 - 3,900 cells/uL   Absolute Monocytes 606 200 - 950 cells/uL   Eosinophils Absolute 1,046 (H) 15 - 500 cells/uL   Basophils Absolute 50 0 - 200 cells/uL   Neutrophils Relative % 48.1 %   Total Lymphocyte 31.4 %   Monocytes Relative 7.3 %   Eosinophils Relative 12.6 %   Basophils Relative 0.6 %  Hemoglobin A1c     Status: Abnormal   Collection Time: 04/09/20  9:18 AM  Result Value Ref Range   Hgb A1c MFr Bld 6.7 (H) <5.7 % of total Hgb    Comment: For someone without known diabetes, a hemoglobin A1c value of 6.5% or greater indicates that they may have  diabetes and this should be confirmed with a follow-up  test. . For someone with known diabetes, a value <7% indicates  that their diabetes is well controlled and a value  greater than or equal to 7% indicates suboptimal  control. A1c targets should be individualized based on  duration of diabetes, age, comorbid conditions, and  other considerations. . Currently, no consensus exists regarding use of hemoglobin A1c for diagnosis of diabetes for children. .    Mean Plasma Glucose 146 (calc)   eAG (mmol/L) 8.1 (calc)  SARS CORONAVIRUS 2 (TAT 6-24 HRS) Nasopharyngeal Nasopharyngeal Swab     Status: None   Collection Time: 05/14/20 10:56 AM   Specimen: Nasopharyngeal Swab  Result Value Ref Range   SARS Coronavirus 2 NEGATIVE NEGATIVE    Comment: (NOTE) SARS-CoV-2 target nucleic acids are NOT DETECTED. The SARS-CoV-2 RNA is generally detectable in upper and lower respiratory specimens during the  acute phase of infection. Negative results do not preclude SARS-CoV-2 infection, do not rule out co-infections with other pathogens, and should not be used as the sole basis for treatment or other patient management decisions. Negative results must be combined with clinical observations, patient history, and epidemiological information. The expected result is Negative. Fact Sheet for Patients: SugarRoll.be Fact Sheet for Healthcare Providers: https://www.woods-mathews.com/ This test is not yet approved or cleared by the [place] FDA and  has been authorized for detection and/or diagnosis of SARS-CoV-2 by FDA under an Emergency Use Authorization (EUA). This EUA will remain  in effect (meaning this test can be used) for the duration of the COVID-19 declaration under Section 56 4(b)(1) of the Act, 21 U.S.C. section 360bbb-3(b)(1), unless the authorization is terminated or revoked sooner. Performed at [facility], [place] [address], [place], [place] [address]   Glucose, capillary     Status: Abnormal   Collection Time: 05/16/20  6:32 AM  Result Value Ref Range   Glucose-Capillary 144 (H) 70 - 99 mg/dL    Comment: Glucose  reference range applies only to samples taken after fasting for at least 8 hours.  Surgical pathology     Status: None   Collection Time: 05/16/20  8:20 AM  Result Value Ref Range   SURGICAL PATHOLOGY      SURGICAL PATHOLOGY CASE: [id number] PATIENT: Christain Sacramento Surgical Pathology Report     Specimen Submitted: A. Uvula papilloma B. Sinus contents, right C. Sinus contents, left  Clinical History: Chronic sinusitis, nasal polyps      DIAGNOSIS: A. UVULA PAPILLOMA; EXCISION: - BENIGN SQUAMOUS PAPILLOMA. - NEGATIVE FOR DYSPLASIA AND MALIGNANCY.  B. SINUS CONTENTS, RIGHT; EXCISION: - CHRONIC SINUSITIS. - NEGATIVE FOR MALIGNANCY.  C. SINUS CONTENTS, LEFT; EXCISION: - CHRONIC SINUSITIS. -  NEGATIVE FOR MALIGNANCY.   GROSS DESCRIPTION: A. Labeled: Uvular papilloma Received: In formalin Tissue fragment(s): 1 Size: 1.3 x 0.6 x 0.3 cm Description: A fragment of pink-red soft tissue, bisected Entirely submitted in 1 cassette.  B. Labeled: Right sinus contents Received: In formalin Size: 2 x 2 x 0.3 cm Description: Aggregate of pink-red soft to firm tissue Block summary: 1-entirely submitted after decalcification  C. Labeled: Left sinus contents  Received: In formalin Size: 1.5 x 1 x 0.2 cm Description: Aggregate of pink-red soft to firm tissue  Block summary: 1-entirely submitted after decalcification.      Final Diagnosis performed by Betsy Pries, MD.   Electronically signed 05/17/2020 10:35:55AM The electronic signature indicates that the named Attending Pathologist has evaluated the specimen Technical component performed at [facility], [address], [place], [place] [address] Lab: [phone] Dir: Rush Farmer, MD, MMM  Professional component performed at [facility], [facility], [place], [place], [place] [address] Lab: [phone] Dir: Dellia Nims. Rubinas, MD   Glucose, capillary     Status: Abnormal   Collection Time: 05/16/20 10:32 AM  Result Value Ref Range   Glucose-Capillary 158 (H) 70 - 99 mg/dL    Comment: Glucose reference range applies only to samples taken after fasting for at least 8 hours.      PHQ2/9: Depression screen [facility] 2/9 06/13/2020 04/11/2020 02/01/2020 01/11/2020 11/18/2019  Decreased Interest 1 1 1 1  0  Down, Depressed, Hopeless 1 1 1 1  0  PHQ - 2 Score 2 2 2 2  0  Altered sleeping 0 3 0 1 0  Tired, decreased energy 1 2 1 1  0  Change in appetite 1 1 0 1 0  Feeling bad or failure about yourself  1 2 1 1  0  Trouble concentrating 0 0 0 0 0  Moving slowly or fidgety/restless 0 1 0 1 0  Suicidal thoughts 0 0 0 0 0  PHQ-9 Score 5 11 4 7  0  Difficult doing work/chores Somewhat difficult Somewhat difficult Not  difficult at all Somewhat difficult Not difficult at all  Some recent data might be hidden    phq 9 is positive   Fall Risk: Fall Risk  06/13/2020 04/11/2020 02/01/2020 01/11/2020 11/18/2019  Falls in the past year? 0 0 0 0 0  Number falls in past yr: 0 0 0 0 0  Injury with Fall? 0 0 0 0 0  Follow up - - Falls evaluation completed - Falls evaluation completed    Functional Status Survey: Is the patient deaf or have difficulty hearing?: No Does the patient have difficulty seeing, even when wearing glasses/contacts?: No Does the patient have difficulty concentrating, remembering, or making decisions?: No Does the patient have difficulty walking or climbing stairs?: No Does the patient have difficulty  dressing or bathing?: No Does the patient have difficulty doing errands alone such as visiting a doctor's office or shopping?: No   Assessment & Plan  1. Benign hypertension  Monitor bp  2. Primary insomnia  She has been sleeping better.   3. GAD (generalized anxiety disorder)  Doing well   4. Primary osteoarthritis of both knees  Keep follow up with Ortho   5. Nontraumatic complete tear of left rotator cuff  Keep follow up with ortho   6. Mild major depression ([place])  Discussed trying duloxetine again

## 2020-06-15 ENCOUNTER — Other Ambulatory Visit: Payer: Self-pay | Admitting: Family Medicine

## 2020-06-15 DIAGNOSIS — E1142 Type 2 diabetes mellitus with diabetic polyneuropathy: Secondary | ICD-10-CM

## 2020-06-15 DIAGNOSIS — M797 Fibromyalgia: Secondary | ICD-10-CM

## 2020-06-15 DIAGNOSIS — M170 Bilateral primary osteoarthritis of knee: Secondary | ICD-10-CM

## 2020-06-15 DIAGNOSIS — F330 Major depressive disorder, recurrent, mild: Secondary | ICD-10-CM

## 2020-06-15 DIAGNOSIS — M5030 Other cervical disc degeneration, unspecified cervical region: Secondary | ICD-10-CM

## 2020-06-15 MED ORDER — DULOXETINE HCL 60 MG PO CPEP: 60.0000 mg | ORAL_CAPSULE | Freq: Every day | ORAL | 0 refills | Status: DC

## 2020-06-15 NOTE — Telephone Encounter (Signed)
Medication: Insulin Degludec-Liraglutide (XULTOPHY) 100-3.6 UNIT-MG/ML SOPN [[id number]] , metFORMIN (GLUCOPHAGE-XR) 750 MG 24 hr tablet [[id number]]  Patient's insurance ends with her employer tonight. Patient is requesting a 90 day script.  Has the patient contacted their pharmacy? YES  (Agent: If no, request that the patient contact the pharmacy for the refill.) (Agent: If yes, when and what did the pharmacy advise?)  Preferred Pharmacy (with phone number or street name): [place], [place]  Phone:  [phone] Fax:  [phone]     Agent: Please be advised that RX refills may take up to 3 business days. We ask that you follow-up with your pharmacy.

## 2020-06-17 ENCOUNTER — Telehealth: Payer: Self-pay | Admitting: Family Medicine

## 2020-06-17 DIAGNOSIS — M5030 Other cervical disc degeneration, unspecified cervical region: Secondary | ICD-10-CM

## 2020-06-17 DIAGNOSIS — F330 Major depressive disorder, recurrent, mild: Secondary | ICD-10-CM

## 2020-06-17 DIAGNOSIS — M797 Fibromyalgia: Secondary | ICD-10-CM

## 2020-06-17 MED ORDER — PREGABALIN 50 MG PO CAPS: 50.0000 mg | ORAL_CAPSULE | Freq: Three times a day (TID) | ORAL | 0 refills | Status: DC

## 2020-06-17 MED ORDER — XULTOPHY 100-3.6 UNIT-MG/ML ~~LOC~~ SOPN: 50.0000 [IU] | PEN_INJECTOR | Freq: Every day | SUBCUTANEOUS | 2 refills | Status: DC

## 2020-06-17 MED ORDER — CELECOXIB 200 MG PO CAPS: 200.0000 mg | ORAL_CAPSULE | Freq: Every day | ORAL | 1 refills | Status: DC

## 2020-06-18 DIAGNOSIS — M1711 Unilateral primary osteoarthritis, right knee: Secondary | ICD-10-CM | POA: Diagnosis not present

## 2020-06-18 DIAGNOSIS — M25561 Pain in right knee: Secondary | ICD-10-CM | POA: Diagnosis not present

## 2020-06-18 DIAGNOSIS — G8929 Other chronic pain: Secondary | ICD-10-CM | POA: Diagnosis not present

## 2020-06-18 MED ORDER — DULOXETINE HCL 60 MG PO CPEP: 60.0000 mg | ORAL_CAPSULE | Freq: Every day | ORAL | 0 refills | Status: DC

## 2020-06-19 ENCOUNTER — Other Ambulatory Visit: Payer: Self-pay | Admitting: Family Medicine

## 2020-06-19 DIAGNOSIS — F330 Major depressive disorder, recurrent, mild: Secondary | ICD-10-CM

## 2020-06-19 DIAGNOSIS — M797 Fibromyalgia: Secondary | ICD-10-CM

## 2020-06-19 DIAGNOSIS — M5030 Other cervical disc degeneration, unspecified cervical region: Secondary | ICD-10-CM

## 2020-06-19 MED ORDER — DULOXETINE HCL 60 MG PO CPEP: 60.0000 mg | ORAL_CAPSULE | Freq: Every day | ORAL | 0 refills | Status: DC

## 2020-06-19 NOTE — Telephone Encounter (Signed)
Pharmacy called and has faxed questions about the directions for DULoxetine (CYMBALTA) 60 MG capsule /please call pharmacy and advise

## 2020-06-19 NOTE — Telephone Encounter (Signed)
Pharmacy states the concern for the DULoxetine (CYMBALTA) 60 MG capsule Is the dosage.  She states pt had 30 mg in April. Not taking since then.  This med is usually a taper up, and pharmacist wants to know does the dr really want to start this dose at 60 mg? She will do whatever the dr wants, but they need call back to confirm the dosage.  Pt has not been on this med since April '21, and it was 30 mg at that time. Please call [place], [place] Phone:  [phone]  Fax:  [phone]

## 2020-06-20 ENCOUNTER — Ambulatory Visit: Payer: BC Managed Care – PPO | Admitting: Family Medicine

## 2020-06-20 NOTE — Telephone Encounter (Signed)
Patient called responding to message that she would prefer  30mg 

## 2020-06-21 ENCOUNTER — Other Ambulatory Visit: Payer: Self-pay | Admitting: Family Medicine

## 2020-06-21 DIAGNOSIS — F330 Major depressive disorder, recurrent, mild: Secondary | ICD-10-CM

## 2020-06-21 DIAGNOSIS — M5030 Other cervical disc degeneration, unspecified cervical region: Secondary | ICD-10-CM

## 2020-06-21 DIAGNOSIS — M797 Fibromyalgia: Secondary | ICD-10-CM

## 2020-06-21 MED ORDER — DULOXETINE HCL 30 MG PO CPEP: 30.0000 mg | ORAL_CAPSULE | Freq: Every day | ORAL | 0 refills | Status: DC

## 2020-07-06 ENCOUNTER — Encounter: Payer: Self-pay | Admitting: Family Medicine

## 2020-07-13 ENCOUNTER — Encounter: Payer: Self-pay | Admitting: Family Medicine

## 2020-07-14 ENCOUNTER — Other Ambulatory Visit: Payer: Self-pay | Admitting: Family Medicine

## 2020-07-14 ENCOUNTER — Encounter: Payer: Self-pay | Admitting: Family Medicine

## 2020-07-14 DIAGNOSIS — F411 Generalized anxiety disorder: Secondary | ICD-10-CM

## 2020-07-14 NOTE — Telephone Encounter (Signed)
Requested medication (s) are due for refill today: yes  Requested medication (s) are on the active medication list: yes  Last refill:  04/11/20  Future visit scheduled: yes  Notes to clinic:  med not delegated to NT to RF   Requested Prescriptions  Pending Prescriptions Disp Refills   ALPRAZolam (XANAX XR) 0.5 MG 24 hr tablet [Pharmacy Med Name: ALPRAZolam ER 0.5 MG Oral Tablet Extended Release 24 Hour] 30 tablet 0    Sig: Take 1 tablet by mouth once daily      Not Delegated - Psychiatry:  Anxiolytics/Hypnotics Failed - 07/14/2020 11:56 AM      Failed - This refill cannot be delegated      Failed - Urine Drug Screen completed in last 360 days.      Passed - Valid encounter within last 6 months    Recent Outpatient Visits           1 month ago Benign hypertension   [facility] Steele Sizer, MD   3 months ago DDD (degenerative disc disease), cervical   [facility] Steele Sizer, MD   5 months ago Benign hypertension   [facility] [place], Drue Stager, MD   6 months ago Diabetic peripheral neuropathy associated with type 2 diabetes mellitus [facility])   [facility] Steele Sizer, MD   7 months ago Acute recurrent maxillary sinusitis   [place], FNP       Future Appointments             In 2 months Steele Sizer, MD [facility], [facility]

## 2020-07-16 ENCOUNTER — Encounter: Payer: Self-pay | Admitting: Family Medicine

## 2020-07-16 ENCOUNTER — Other Ambulatory Visit: Payer: Self-pay | Admitting: Family Medicine

## 2020-07-16 DIAGNOSIS — M5030 Other cervical disc degeneration, unspecified cervical region: Secondary | ICD-10-CM

## 2020-07-16 DIAGNOSIS — M797 Fibromyalgia: Secondary | ICD-10-CM

## 2020-07-16 MED ORDER — DICLOFENAC SODIUM 75 MG PO TBEC: 75.0000 mg | DELAYED_RELEASE_TABLET | Freq: Two times a day (BID) | ORAL | 0 refills | Status: DC

## 2020-07-16 MED ORDER — GABAPENTIN 100 MG PO CAPS: 100.0000 mg | ORAL_CAPSULE | Freq: Three times a day (TID) | ORAL | 0 refills | Status: DC

## 2020-07-17 ENCOUNTER — Telehealth: Payer: Self-pay | Admitting: Family Medicine

## 2020-07-17 NOTE — Telephone Encounter (Signed)
Copied from [place] [phone]. Topic: General - Other >> Jul 17, 2020  4:03 PM Keene Breath wrote: Reason for CRM: Patient called to ask for a PA for medication, Insulin Degludec-Liraglutide (XULTOPHY) 100-3.6 UNIT-MG/ML SOPN, which the pharmacy said she would need from the doctor.  Please advise and call patient to confirm.  CB# [phone]

## 2020-07-18 NOTE — Telephone Encounter (Signed)
sent through ins yesterday

## 2020-07-20 ENCOUNTER — Encounter: Payer: Self-pay | Admitting: Family Medicine

## 2020-07-20 NOTE — Telephone Encounter (Signed)
PA resent again today.  Pt notified through [employer]

## 2020-07-20 NOTE — Telephone Encounter (Signed)
Patient called to check the status of her PA for her medication.  If there is no answer, please leave a VM.

## 2020-07-25 ENCOUNTER — Encounter: Payer: Self-pay | Admitting: Family Medicine

## 2020-07-26 ENCOUNTER — Encounter: Payer: Self-pay | Admitting: Family Medicine

## 2020-08-09 ENCOUNTER — Ambulatory Visit: Payer: Self-pay

## 2020-08-09 NOTE — Telephone Encounter (Signed)
Pt. Asking what can she safely take OTC for sinus drainage and wheezing and also shoulder pain. Took Voltaren for pain this morning. Instructed Coricidin is safe with HTN. Please advise pt.

## 2020-08-10 ENCOUNTER — Telehealth: Payer: BC Managed Care – PPO | Admitting: Internal Medicine

## 2020-08-17 ENCOUNTER — Other Ambulatory Visit: Payer: Self-pay | Admitting: Family Medicine

## 2020-08-17 DIAGNOSIS — F411 Generalized anxiety disorder: Secondary | ICD-10-CM

## 2020-08-17 DIAGNOSIS — I10 Essential (primary) hypertension: Secondary | ICD-10-CM

## 2020-08-17 NOTE — Telephone Encounter (Signed)
Requested Prescriptions  Pending Prescriptions Disp Refills   lisinopril (ZESTRIL) 10 MG tablet [Pharmacy Med Name: Lisinopril 10 MG Oral Tablet] 90 tablet 0    Sig: Take 1 tablet by mouth once daily     Cardiovascular:  ACE Inhibitors Failed - 08/17/2020  6:14 PM      Failed - Last BP in normal range    BP Readings from Last 1 Encounters:  06/13/20 120/90         Passed - Cr in normal range and within 180 days    Creat  Date Value Ref Range Status  04/09/2020 0.87 0.50 - 1.05 mg/dL Final    Comment:    For patients >38 years of age, the reference limit for Creatinine is approximately 13% higher for people identified as African-American. .    Creatinine, Urine  Date Value Ref Range Status  06/20/2019 146 20 - 275 mg/dL Final         Passed - K in normal range and within 180 days    Potassium  Date Value Ref Range Status  04/09/2020 3.9 3.5 - 5.3 mmol/L Final  08/01/2014 3.6 3.5 - 5.1 mmol/L Final         Passed - Patient is not pregnant      Passed - Valid encounter within last 6 months    Recent Outpatient Visits          2 months ago Benign hypertension   [facility] [place], Drue Stager, MD   4 months ago DDD (degenerative disc disease), cervical   [facility] Steele Sizer, MD   6 months ago Benign hypertension   [facility] [place], Drue Stager, MD   7 months ago Diabetic peripheral neuropathy associated with type 2 diabetes mellitus [facility])   [facility] Steele Sizer, MD   9 months ago Acute recurrent maxillary sinusitis   [place], [place]      Future Appointments            In 2 weeks Towanda Malkin, MD [facility], [place]   In 3 months Steele Sizer, MD [facility], [facility]            ALPRAZolam (XANAX XR) 0.5 MG 24 hr tablet [Pharmacy Med Name: ALPRAZolam ER 0.5 MG Oral Tablet Extended Release 24  Hour] 30 tablet 0    Sig: Take 1 tablet by mouth once daily     Not Delegated - Psychiatry:  Anxiolytics/Hypnotics Failed - 08/17/2020  6:14 PM      Failed - This refill cannot be delegated      Failed - Urine Drug Screen completed in last 360 days.      Passed - Valid encounter within last 6 months    Recent Outpatient Visits          2 months ago Benign hypertension   [facility] Steele Sizer, MD   4 months ago DDD (degenerative disc disease), cervical   [facility] Steele Sizer, MD   6 months ago Benign hypertension   [facility] [place], Drue Stager, MD   7 months ago Diabetic peripheral neuropathy associated with type 2 diabetes mellitus [facility])   [facility] Steele Sizer, MD   9 months ago Acute recurrent maxillary sinusitis   [place], [place], [place]      Future Appointments  In 2 weeks Towanda Malkin, MD [facility], [facility]   In 3 months Steele Sizer, MD [facility], [facility]

## 2020-08-17 NOTE — Telephone Encounter (Signed)
Requested medication (s) are due for refill today: yes  Requested medication (s) are on the active medication list: yes  Last refill: 07/16/20  #30  0 refills  Future visit scheduled: yes  Notes to clinic:  Not delegated    Requested Prescriptions  Pending Prescriptions Disp Refills   ALPRAZolam (XANAX XR) 0.5 MG 24 hr tablet [Pharmacy Med Name: ALPRAZolam ER 0.5 MG Oral Tablet Extended Release 24 Hour] 30 tablet 0    Sig: Take 1 tablet by mouth once daily      Not Delegated - Psychiatry:  Anxiolytics/Hypnotics Failed - 08/17/2020  6:14 PM      Failed - This refill cannot be delegated      Failed - Urine Drug Screen completed in last 360 days.      Passed - Valid encounter within last 6 months    Recent Outpatient Visits           2 months ago Benign hypertension   [facility] Steele Sizer, MD   4 months ago DDD (degenerative disc disease), cervical   [facility] Steele Sizer, MD   6 months ago Benign hypertension   [facility] [address], Drue Stager, MD   7 months ago Diabetic peripheral neuropathy associated with type 2 diabetes mellitus [facility])   [facility] Steele Sizer, MD   9 months ago Acute recurrent maxillary sinusitis   [facility] [place], FNP       Future Appointments             In 2 weeks Towanda Malkin, MD [facility], [place]   In 3 months Steele Sizer, MD [facility], [facility]             Signed Prescriptions Disp Refills   lisinopril (ZESTRIL) 10 MG tablet 90 tablet 0    Sig: Take 1 tablet by mouth once daily      Cardiovascular:  ACE Inhibitors Failed - 08/17/2020  6:14 PM      Failed - Last BP in normal range    BP Readings from Last 1 Encounters:  06/13/20 120/90          Passed - Cr in normal range and within 180 days    Creat  Date Value Ref Range Status  04/09/2020 0.87 0.50 - 1.05  mg/dL Final    Comment:    For patients >61 years of age, the reference limit for Creatinine is approximately 13% higher for people identified as African-American. .    Creatinine, Urine  Date Value Ref Range Status  06/20/2019 146 20 - 275 mg/dL Final          Passed - K in normal range and within 180 days    Potassium  Date Value Ref Range Status  04/09/2020 3.9 3.5 - 5.3 mmol/L Final  08/01/2014 3.6 3.5 - 5.1 mmol/L Final          Passed - Patient is not pregnant      Passed - Valid encounter within last 6 months    Recent Outpatient Visits           2 months ago Benign hypertension   [facility] Steele Sizer, MD   4 months ago DDD (degenerative disc disease), cervical   [facility] Steele Sizer, MD   6 months ago Benign hypertension   [facility] Steele Sizer, MD   7  months ago Diabetic peripheral neuropathy associated with type 2 diabetes mellitus [facility])   [facility] Steele Sizer, MD   9 months ago Acute recurrent maxillary sinusitis   [place], FNP       Future Appointments             In 2 weeks Towanda Malkin, MD [facility], [place]   In 3 months Steele Sizer, MD [facility], [facility]

## 2020-08-29 NOTE — Progress Notes (Signed)
Name: Lindsay Mcdonald   MRN: [id number]    DOB: 1961/08/17   Date:08/31/2020       Progress Note  Subjective  Chief Complaint  Chief Complaint  Patient presents with  . Follow-up  . Anxiety  . Hyperlipidemia  . Diabetes  . Hypertension    I connected with  Christain Sacramento on 08/31/20 at 10:20 AM EDT by telephone and verified that I am speaking with the correct person using two identifiers.  I discussed the limitations, risks, security and privacy concerns of performing an evaluation and management service by telephone and the availability of in person appointments. Staff also discussed with the patient that there may be a patient responsible charge related to this service. Patient Location: at home  Provider Location: [facility]   HPI   Mild Major Depression/insomnia: she is afraid of taking medications, still has duloxetine at home, always afraid of side effects. Explained Duloxetine is safer than BZD. She still has problems with sleep, feels anxious at work, she initially asked to wean off xanax XR and go on lorazepam, but changed her mind when I said I would only give her prn lorazepam and clonidine to come on alprazolam XR. She continues to refuse SSRI  OA: seeing Dr. Candelaria Stagers and had steroid injections on both knees and has helped with pain on knees, no effusion or redness, she has some pain on right popliteal fossa, she has a bakers cyst and is going back next week to see. Kubinski   FMS: she has a long history of feeling aching and tenderworse on trunk and upper extremities. . Pain on shoulders, arms, stiff on her hips . Feels a little stiff when first gets up, she has some mental fogginess,she stopped Lyrica again on her own, she does not like taking medications and goggled side effects, she stopped taking Duloxetine on her own. She has some flexeril at home from Dr. Candelaria Stagers   DMII:glucose lately has beenelevated due to prednisoneA1C wentfrom 6.6 % to 7.9%to 8.1% , last  visit it was down to 6.7 % today is up to 7 %, she did get a steroid injection recently    on Metformin and Xultophy 50 units daily She has neuropathy and pain feels like water flowing down her legs , eye exam is up to date . She denies polyphagia or polydipsia  Snoring: waking up gasping for air, has DM, HTN, and obesity, she will stop by for ESS and we will schedule a sleep study   HTN: she is on lisinopril 10 mg daily and denies cough, or chest pain, or palpitation   Hyperlipidemia: she is back on Pravastatin but only a few times a week now, reviewed last labs   Morbid Obesity:BMI is still very high, she has been eating more than usual secondary to stress of new job, discussed healthier snacks   DDD cervical spine: she went to  [facility] for evaluation, had c- spine x-ray that showed DDD C5-6, also had a normal shoulder x-ray. Pain was sharp and shooting down left elbow, it was affecting her sleep, she saw Dr. Rudene Christians and he recommend evaluation by neurosurgeon and PT , but neurosurgeon advised follow up with Dr. Phyllis Ginger, she states since same side as rotator cuff she will wait until she is able to surgery for that   Nasal polyp: on the right side, seeing Dr. Ladene Artist , recent sinus surgery  Left rotator cuff tear: seen by Ortho was going to have surgery but post-poned since she  has a new job, she still has pain, but not constant. Symptoms is worse at night.   Patient Active Problem List   Diagnosis Date Noted  . History of marijuana use 03/30/2019  . Fibromyalgia 12/31/2018  . Venous vascular malformations 03/27/2016  . Diabetic peripheral neuropathy associated with type 2 diabetes mellitus ([facility]) 09/26/2015  . Non compliance w medication regimen 09/26/2015  . Seasonal allergic rhinitis 09/26/2015  . Muscle spasms of neck 06/19/2015  . Insomnia 06/19/2015  . Primary osteoarthritis of right knee 06/17/2015  . Depression, major, recurrent, mild ([place]) 06/17/2015  . Diabetes  mellitus type 2, uncontrolled ([place]) 06/17/2015  . Dyslipidemia 06/17/2015  . Benign hypertension 06/17/2015  . Morbid obesity with BMI of 40.0-44.9, adult ([place]) 06/17/2015  . Vitamin D deficiency 06/17/2015    Past Surgical History:  Procedure Laterality Date  . BREAST REDUCTION SURGERY    . ETHMOIDECTOMY Right 05/16/2020   Procedure: ETHMOIDECTOMY;  Surgeon: Margaretha Sheffield, MD;  Location: [facility] ORS;  Service: ENT;  Laterality: Right;  . EXCISION ORAL TUMOR N/A 05/16/2020   Procedure: EXCISION OF SOFT PALATE PAPILLOMA;  Surgeon: Margaretha Sheffield, MD;  Location: [facility] ORS;  Service: ENT;  Laterality: N/A;  . FRONTAL SINUS EXPLORATION Right 05/16/2020   Procedure: FRONTAL SINUS EXPLORATION;  Surgeon: Margaretha Sheffield, MD;  Location: [facility] ORS;  Service: ENT;  Laterality: Right;  . IMAGE GUIDED SINUS SURGERY N/A 05/16/2020   Procedure: IMAGE GUIDED SINUS SURGERY;  Surgeon: Margaretha Sheffield, MD;  Location: [facility] ORS;  Service: ENT;  Laterality: N/A;  . MAXILLARY ANTROSTOMY Bilateral 05/16/2020   Procedure: MAXILLARY ANTROSTOMY with tissue removal;  Surgeon: Margaretha Sheffield, MD;  Location: [facility] ORS;  Service: ENT;  Laterality: Bilateral;  . nasal endoscopic    . OOPHORECTOMY  ?   one ovary removed   . REDUCTION MAMMAPLASTY Bilateral 2000  . removal of ovary    . SPHENOIDECTOMY Right 05/16/2020   Procedure: SPHENOIDECTOMY;  Surgeon: Margaretha Sheffield, MD;  Location: [facility] ORS;  Service: ENT;  Laterality: Right;    Family History  Problem Relation Age of Onset  . Diabetes Mother   . Cancer Mother 75       Breast  . Breast cancer Mother 61  . Gout Mother   . Diabetes Daughter        Oldest Daughter  . Stroke Maternal Uncle   . Breast cancer Maternal Aunt 36  . Diabetes Father   . Kidney cancer Maternal Aunt   . Kidney disease Maternal Aunt     Social History   Socioeconomic History  . Marital status: Divorced    Spouse name: Not on file  . Number of children: 2  . Years of education: Not on file  .  Highest education level: Some college, no degree  Occupational History  . Occupation: Designer, television/film set   Tobacco Use  . Smoking status: Never Smoker  . Smokeless tobacco: Never Used  Vaping Use  . Vaping Use: Former  Substance and Sexual Activity  . Alcohol use: No    Alcohol/week: 0.0 standard drinks  . Drug use: No  . Sexual activity: Not Currently    Partners: Male    Birth control/protection: None  Other Topics Concern  . Not on file  Social History Narrative   Divorced and he died since   Working at Glass blower/designer.    Social Determinants of Health   Financial Resource Strain: Medium Risk  . Difficulty of Paying Living Expenses: Somewhat hard  Food Insecurity: Food  Insecurity Present  . Worried About Charity fundraiser in the Last Year: Sometimes true  . Ran Out of Food in the Last Year: Sometimes true  Transportation Needs: No Transportation Needs  . Lack of Transportation (Medical): No  . Lack of Transportation (Non-Medical): No  Physical Activity: Insufficiently Active  . Days of Exercise per Week: 7 days  . Minutes of Exercise per Session: 10 min  Stress: No Stress Concern Present  . Feeling of Stress : Only a little  Social Connections: Moderately Integrated  . Frequency of Communication with Friends and Family: More than three times a week  . Frequency of Social Gatherings with Friends and Family: Once a week  . Attends Religious Services: 1 to 4 times per year  . Active Member of Clubs or Organizations: Yes  . Attends Archivist Meetings: Never  . Marital Status: Divorced  Human resources officer Violence: Not At Risk  . Fear of Current or Ex-Partner: No  . Emotionally Abused: No  . Physically Abused: No  . Sexually Abused: No     Current Outpatient Medications:  .  ALPRAZolam (XANAX XR) 0.5 MG 24 hr tablet, Take 1 tablet by mouth once daily, Disp: 30 tablet, Rfl: 0 .  aspirin EC 81 MG tablet, Take 81 mg by mouth daily. Swallow whole., Disp: , Rfl:   .  diclofenac (VOLTAREN) 75 MG EC tablet, Take 1 tablet (75 mg total) by mouth 2 (two) times daily., Disp: 30 tablet, Rfl: 0 .  fluticasone (FLONASE) 50 MCG/ACT nasal spray, Place 2 sprays into both nostrils daily., Disp: 16 g, Rfl: 6 .  Insulin Degludec-Liraglutide (XULTOPHY) 100-3.6 UNIT-MG/ML SOPN, Inject 50 Units into the skin daily., Disp: 15 mL, Rfl: 2 .  lisinopril (ZESTRIL) 10 MG tablet, Take 1 tablet by mouth once daily, Disp: 90 tablet, Rfl: 0 .  Melatonin 10 MG TABS, Take 10 mg by mouth at bedtime as needed (sleep). , Disp: , Rfl:  .  metFORMIN (GLUCOPHAGE-XR) 750 MG 24 hr tablet, Take 2 tablets (1,500 mg total) by mouth daily with breakfast. (Patient taking differently: Take 750 mg by mouth 2 (two) times daily. ), Disp: 180 tablet, Rfl: 1 .  Multiple Vitamin (MULITIVITAMIN WITH MINERALS) TABS, Take 1 tablet by mouth daily.  , Disp: , Rfl:  .  pravastatin (PRAVACHOL) 20 MG tablet, TAKE 1 TABLET BY MOUTH THREE TIMES A WEEK (Patient taking differently: Take 20 mg by mouth 3 (three) times a week. ), Disp: 36 tablet, Rfl: 3 .  glucose blood test strip, Use as instructed, Disp: 100 each, Rfl: 12 .  Insulin Pen Needle 32G X 6 MM MISC, 1 each by Does not apply route daily., Disp: 100 each, Rfl: 1  Allergies  Allergen Reactions  . Baclofen Other (See Comments)    Spasms/Takes as needed  . Simvastatin     Muscle cramps   . Atorvastatin Other (See Comments)    muscle cramps  . Compazine Other (See Comments)    Twitching and spasms of neck muscles  . Penicillins Rash    Reaction under 25    I personally reviewed active problem list, medication list, allergies, family history, social history, health maintenance with the patient/caregiver today.   ROS  Ten systems reviewed and is negative except as mentioned in HPI   Objective  Virtual encounter, vitals not obtained.  Body mass index is 46.69 kg/m.  Physical Exam  Awake, alert and oriented   PHQ2/9: Depression screen [facility]  2/9 08/31/2020  06/13/2020 04/11/2020 02/01/2020 01/11/2020  Decreased Interest 1 1 1 1 1   Down, Depressed, Hopeless 1 1 1 1 1   PHQ - 2 Score 2 2 2 2 2   Altered sleeping 0 0 3 0 1  Tired, decreased energy 1 1 2 1 1   Change in appetite 3 1 1  0 1  Feeling bad or failure about yourself  1 1 2 1 1   Trouble concentrating 0 0 0 0 0  Moving slowly or fidgety/restless 0 0 1 0 1  Suicidal thoughts 0 0 0 0 0  PHQ-9 Score 7 5 11 4 7   Difficult doing work/chores Somewhat difficult Somewhat difficult Somewhat difficult Not difficult at all Somewhat difficult  Some recent data might be hidden   PHQ-2/9 Result is positive.    GAD 7 : Generalized Anxiety Score 08/31/2020 04/11/2020 09/28/2019 12/31/2018  Nervous, Anxious, on Edge 2 1 1 1   Control/stop worrying 3 1 1 1   Worry too much - different things 3 1 1 1   Trouble relaxing 2 1 1 1   Restless 2 1 1 1   Easily annoyed or irritable 1 1 1  0  Afraid - awful might happen 2 1 1 1   Total GAD 7 Score 15 7 7 6   Anxiety Difficulty Somewhat difficult - Somewhat difficult -    Fall Risk: Fall Risk  08/31/2020 06/13/2020 04/11/2020 02/01/2020 01/11/2020  Falls in the past year? 0 0 0 0 0  Number falls in past yr: 0 0 0 0 0  Injury with Fall? 0 0 0 0 0  Follow up - - - Falls evaluation completed -     Assessment & Plan  1. Diabetic peripheral neuropathy associated with type 2 diabetes mellitus (HCC)  - metFORMIN (GLUCOPHAGE-XR) 750 MG 24 hr tablet; Take 2 tablets (1,500 mg total) by mouth daily with breakfast.  Dispense: 180 tablet; Refill: 1 - POCT HgB A1C  2. GAD (generalized anxiety disorder)  She asked to stop alprazolam XR , but before the end of the visit she asked to go back on it   Alprazolam XR refill sent   3. Benign hypertension  - lisinopril (ZESTRIL) 10 MG tablet; Take 1 tablet (10 mg total) by mouth daily.  Dispense: 90 tablet; Refill: 0  4. Needs flu shot  - Flu Vaccine QUAD 6+ mos PF IM (Fluarix Quad PF)  5. Dyslipidemia   6.  Fibromyalgia   7. Depression, major, recurrent, mild ([place])   8. Snoring  ESS today and sleep study   9. Bilateral primary osteoarthritis of knee   10. Nontraumatic complete tear of left rotator cuff  Keep follow up with Ortho   11. Hypertension associated with diabetes ([place])   I discussed the assessment and treatment plan with the patient. The patient was provided an opportunity to ask questions and all were answered. The patient agreed with the plan and demonstrated an understanding of the instructions.   The patient was advised to call back or seek an in-person evaluation if the symptoms worsen or if the condition fails to improve as anticipated.  I provided 25  minutes of non-face-to-face time during this encounter.  Loistine Chance, MD

## 2020-08-31 ENCOUNTER — Encounter: Payer: Self-pay | Admitting: Family Medicine

## 2020-08-31 ENCOUNTER — Other Ambulatory Visit: Payer: Self-pay

## 2020-08-31 ENCOUNTER — Ambulatory Visit: Payer: Self-pay | Admitting: Family Medicine

## 2020-08-31 ENCOUNTER — Ambulatory Visit (INDEPENDENT_AMBULATORY_CARE_PROVIDER_SITE_OTHER): Payer: Self-pay | Admitting: Family Medicine

## 2020-08-31 VITALS — BP 136/86 | HR 98 | Resp 14 | Ht 64.0 in | Wt 272.0 lb

## 2020-08-31 DIAGNOSIS — R0683 Snoring: Secondary | ICD-10-CM

## 2020-08-31 DIAGNOSIS — M797 Fibromyalgia: Secondary | ICD-10-CM

## 2020-08-31 DIAGNOSIS — E1159 Type 2 diabetes mellitus with other circulatory complications: Secondary | ICD-10-CM

## 2020-08-31 DIAGNOSIS — F330 Major depressive disorder, recurrent, mild: Secondary | ICD-10-CM

## 2020-08-31 DIAGNOSIS — E785 Hyperlipidemia, unspecified: Secondary | ICD-10-CM

## 2020-08-31 DIAGNOSIS — F411 Generalized anxiety disorder: Secondary | ICD-10-CM

## 2020-08-31 DIAGNOSIS — M75122 Complete rotator cuff tear or rupture of left shoulder, not specified as traumatic: Secondary | ICD-10-CM

## 2020-08-31 DIAGNOSIS — M170 Bilateral primary osteoarthritis of knee: Secondary | ICD-10-CM

## 2020-08-31 DIAGNOSIS — I10 Essential (primary) hypertension: Secondary | ICD-10-CM

## 2020-08-31 DIAGNOSIS — E1142 Type 2 diabetes mellitus with diabetic polyneuropathy: Secondary | ICD-10-CM

## 2020-08-31 DIAGNOSIS — Z23 Encounter for immunization: Secondary | ICD-10-CM

## 2020-08-31 DIAGNOSIS — I152 Hypertension secondary to endocrine disorders: Secondary | ICD-10-CM | POA: Insufficient documentation

## 2020-08-31 LAB — POCT GLYCOSYLATED HEMOGLOBIN (HGB A1C): Hemoglobin A1C: 7 % — AB (ref 4.0–5.6)

## 2020-08-31 MED ORDER — LISINOPRIL 10 MG PO TABS: 10.0000 mg | ORAL_TABLET | Freq: Every day | ORAL | 0 refills | Status: DC

## 2020-08-31 MED ORDER — METFORMIN HCL ER 750 MG PO TB24: 1500.0000 mg | ORAL_TABLET | Freq: Every day | ORAL | 1 refills | Status: DC

## 2020-08-31 MED ORDER — ALPRAZOLAM ER 0.5 MG PO TB24: 0.5000 mg | ORAL_TABLET | Freq: Every day | ORAL | 2 refills | Status: DC

## 2020-09-13 ENCOUNTER — Ambulatory Visit: Payer: Self-pay | Admitting: Family Medicine

## 2020-09-19 ENCOUNTER — Ambulatory Visit: Payer: BC Managed Care – PPO | Admitting: Family Medicine

## 2020-09-19 ENCOUNTER — Encounter: Payer: Self-pay | Admitting: Family Medicine

## 2020-10-07 ENCOUNTER — Encounter: Payer: Self-pay | Admitting: Family Medicine

## 2020-10-13 ENCOUNTER — Other Ambulatory Visit: Payer: Self-pay | Admitting: Family Medicine

## 2020-10-22 ENCOUNTER — Other Ambulatory Visit: Payer: Self-pay | Admitting: Family Medicine

## 2020-10-22 ENCOUNTER — Encounter: Payer: Self-pay | Admitting: Family Medicine

## 2020-10-22 DIAGNOSIS — E1142 Type 2 diabetes mellitus with diabetic polyneuropathy: Secondary | ICD-10-CM

## 2020-10-23 ENCOUNTER — Encounter: Payer: Self-pay | Admitting: Family Medicine

## 2020-10-30 ENCOUNTER — Other Ambulatory Visit: Payer: Self-pay | Admitting: Family Medicine

## 2020-10-30 DIAGNOSIS — F411 Generalized anxiety disorder: Secondary | ICD-10-CM

## 2020-10-30 NOTE — Telephone Encounter (Signed)
Requested medication (s) are due for refill today:  Yes  Requested medication (s) are on the active medication list:  Yes  Future visit scheduled:  Yes  Last Refill: 08/31/20; #30; RF x 2  Notes to clinic:  Not delegated.  Pt. Is requesting a 2 mo. supply of medication.  Requested Prescriptions  Pending Prescriptions Disp Refills   ALPRAZolam (XANAX XR) 0.5 MG 24 hr tablet 30 tablet 2    Sig: Take 1 tablet (0.5 mg total) by mouth daily.      Not Delegated - Psychiatry:  Anxiolytics/Hypnotics Failed - 10/30/2020  3:39 PM      Failed - This refill cannot be delegated      Failed - Urine Drug Screen completed in last 360 days      Passed - Valid encounter within last 6 months    Recent Outpatient Visits           2 months ago Diabetic peripheral neuropathy associated with type 2 diabetes mellitus [facility])   [facility] Steele Sizer, MD   4 months ago Benign hypertension   [facility] Steele Sizer, MD   6 months ago DDD (degenerative disc disease), cervical   [facility] Steele Sizer, MD   9 months ago Benign hypertension   [facility] [place], Drue Stager, MD   9 months ago Diabetic peripheral neuropathy associated with type 2 diabetes mellitus [facility])   [facility] Steele Sizer, MD       Future Appointments             In 2 months Ancil Boozer, Drue Stager, MD [facility], [facility]

## 2020-10-30 NOTE — Telephone Encounter (Signed)
Medication Refill - Medication: ALPRAZolam (XANAX XR) 0.5 MG 24 hr tablet (Patient is requesting 2 month supply of medication be sent to pharmacy.)   Has the patient contacted their pharmacy? yes (Agent: If no, request that the patient contact the pharmacy for the refill.) (Agent: If yes, when and what did the pharmacy advise?)Contact PCP  Preferred Pharmacy (with phone number or street name):  [place], [place] Phone:  [phone]  Fax:  [phone]       Agent: Please be advised that RX refills may take up to 3 business days. We ask that you follow-up with your pharmacy.

## 2020-11-03 ENCOUNTER — Encounter: Payer: Self-pay | Admitting: Family Medicine

## 2020-11-05 ENCOUNTER — Other Ambulatory Visit: Payer: Self-pay

## 2020-11-05 DIAGNOSIS — F411 Generalized anxiety disorder: Secondary | ICD-10-CM

## 2020-11-06 ENCOUNTER — Encounter: Payer: Self-pay | Admitting: Family Medicine

## 2020-11-12 NOTE — Progress Notes (Signed)
Name: Lindsay Mcdonald   MRN: [id number]    DOB: 11-19-1961   Date:11/13/2020       Progress Note  Subjective  Chief Complaint  Follow up   HPI   Mild Major Depression/insomnia: she is afraid of taking medications, explained again that SSRI is safer than BZD.   She has been on alprazolam XR and is afraid to stop taking medication, we will continue current dose  . Phq 9 is elevated. Denies suicidal thoughts or ideation  OA: seeing Dr. Candelaria Stagers and had steroid injections on both knees and has helped with pain on knees, no effusion or redness, she has some pain on right popliteal fossa, she has a bakers cysts, she will follow up with Dr. Candelaria Stagers  FMS: she has a long history of feeling aching and tenderworse on trunk and upper extremities. . Pain on shoulders, arms, stiff on her hips . Feels a little stiff when first gets up, she has some mental fogginess,she stopped Lyrica again on her own, she does not like taking medications and goggled side effects, she also  stopped taking Duloxetine  She stopped taking flexeril   DMII:glucose lately has beenelevated due to prednisoneA1C wentfrom 6.6 % to 7.9%to 8.1% , last visit it was down to 6.7 % last time it was  7 % - she states still eating candy bars and about one week she had a lot of sweet snacks and woke up during the night to void multiple times, denies associated polydipsia. She is on Metformin and Xultophy 50 units daily She has neuropathy and pain feels like water flowing down her legs. She states she has a follow up next month   Snoring: waking up gasping for air, has DM, HTN, and obesity, she will stop by for ESS , we scheduled a sleep study but she felt uncomfortable but will try to re-scheduled it   HTN: she is on lisinopril 10 mg daily and denies cough, or chest pain, or palpitation, discussed adjusting dose of lisinopril to 20 mg and she agrees    Hyperlipidemia: she is back on Pravastatin but only a few times a week now, last  LDL was not at goal - it was 108 , discussed taking at least every other day   Morbid Obesity:BMI is still very high, she has been snacking on unhealthy snacks. Discussed importance of life style modification   DDD cervical spine: she went to  [facility] for evaluation, had c- spine x-ray that showed DDD C5-6, also had a normal shoulder x-ray. Pain was sharp and shooting down left elbow, it was affecting her sleep, she saw Dr. Rudene Christians and he recommend evaluation by neurosurgeon and PT , but neurosurgeon advised follow up with Dr. Phyllis Ginger.  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 She is holding off for now Taking diclofenac prn, sometimes ibuprofen instead   Patient Active Problem List   Diagnosis Date Noted  . Hypertension associated with diabetes ([place]) 08/31/2020  . History of marijuana use 03/30/2019  . Fibromyalgia 12/31/2018  . Venous vascular malformations 03/27/2016  . Diabetic peripheral neuropathy associated with type 2 diabetes mellitus ([place]) 09/26/2015  . Non compliance w medication regimen 09/26/2015  . Seasonal allergic rhinitis 09/26/2015  . Muscle spasms of neck 06/19/2015  . Insomnia 06/19/2015  . Primary osteoarthritis of right knee 06/17/2015  . Depression, major, recurrent, mild ([place]) 06/17/2015  . Dyslipidemia 06/17/2015  .  Benign hypertension 06/17/2015  . Morbid obesity with BMI of 40.0-44.9, adult ([place]) 06/17/2015  . Vitamin D deficiency 06/17/2015    Past Surgical History:  Procedure Laterality Date  . BREAST REDUCTION SURGERY    . ETHMOIDECTOMY Right 05/16/2020   Procedure: ETHMOIDECTOMY;  Surgeon: Margaretha Sheffield, MD;  Location: [facility] ORS;  Service: ENT;  Laterality: Right;  . EXCISION ORAL TUMOR N/A 05/16/2020   Procedure: EXCISION OF SOFT PALATE PAPILLOMA;  Surgeon: Margaretha Sheffield, MD;  Location: [facility] ORS;  Service: ENT;  Laterality: N/A;  . FRONTAL SINUS EXPLORATION  Right 05/16/2020   Procedure: FRONTAL SINUS EXPLORATION;  Surgeon: Margaretha Sheffield, MD;  Location: [facility] ORS;  Service: ENT;  Laterality: Right;  . IMAGE GUIDED SINUS SURGERY N/A 05/16/2020   Procedure: IMAGE GUIDED SINUS SURGERY;  Surgeon: Margaretha Sheffield, MD;  Location: [facility] ORS;  Service: ENT;  Laterality: N/A;  . MAXILLARY ANTROSTOMY Bilateral 05/16/2020   Procedure: MAXILLARY ANTROSTOMY with tissue removal;  Surgeon: Margaretha Sheffield, MD;  Location: [facility] ORS;  Service: ENT;  Laterality: Bilateral;  . nasal endoscopic    . OOPHORECTOMY  ?   one ovary removed   . REDUCTION MAMMAPLASTY Bilateral 2000  . removal of ovary    . SPHENOIDECTOMY Right 05/16/2020   Procedure: SPHENOIDECTOMY;  Surgeon: Margaretha Sheffield, MD;  Location: [facility] ORS;  Service: ENT;  Laterality: Right;    Family History  Problem Relation Age of Onset  . Diabetes Mother   . Cancer Mother 38       Breast  . Breast cancer Mother 18  . Gout Mother   . Diabetes Daughter        Oldest Daughter  . Stroke Maternal Uncle   . Breast cancer Maternal Aunt 15  . Diabetes Father   . Kidney cancer Maternal Aunt   . Kidney disease Maternal Aunt     Social History   Tobacco Use  . Smoking status: Never Smoker  . Smokeless tobacco: Never Used  Substance Use Topics  . Alcohol use: No    Alcohol/week: 0.0 standard drinks     Current Outpatient Medications:  .  ALPRAZolam (XANAX XR) 0.5 MG 24 hr tablet, Take 1 tablet (0.5 mg total) by mouth daily., Disp: 30 tablet, Rfl: 2 .  aspirin EC 81 MG tablet, Take 81 mg by mouth daily. Swallow whole., Disp: , Rfl:  .  diclofenac (VOLTAREN) 75 MG EC tablet, Take 1 tablet (75 mg total) by mouth 2 (two) times daily., Disp: 30 tablet, Rfl: 0 .  fluticasone (FLONASE) 50 MCG/ACT nasal spray, Place 2 sprays into both nostrils daily., Disp: 16 g, Rfl: 6 .  glucose blood test strip, Use as instructed, Disp: 100 each, Rfl: 12 .  Insulin Pen Needle 32G X 6 MM MISC, 1 each by Does not apply route daily.,  Disp: 100 each, Rfl: 1 .  lisinopril (ZESTRIL) 10 MG tablet, Take 1 tablet (10 mg total) by mouth daily., Disp: 90 tablet, Rfl: 0 .  Melatonin 10 MG TABS, Take 10 mg by mouth at bedtime as needed (sleep). , Disp: , Rfl:  .  metFORMIN (GLUCOPHAGE-XR) 750 MG 24 hr tablet, Take 2 tablets (1,500 mg total) by mouth daily with breakfast., Disp: 180 tablet, Rfl: 1 .  Multiple Vitamin (MULITIVITAMIN WITH MINERALS) TABS, Take 1 tablet by mouth daily.  , Disp: , Rfl:  .  pravastatin (PRAVACHOL) 20 MG tablet, TAKE 1 TABLET BY MOUTH THREE TIMES A WEEK (Patient taking differently: Take 20 mg by mouth 3 (  three) times a week. ), Disp: 36 tablet, Rfl: 3 .  XULTOPHY 100-3.6 UNIT-MG/ML SOPN, INJECT 50 UNITS INTO THE SKIN DAILY, Disp: 15 mL, Rfl: 2  Allergies  Allergen Reactions  . Baclofen Other (See Comments)    Spasms/Takes as needed  . Simvastatin     Muscle cramps   . Atorvastatin Other (See Comments)    muscle cramps  . Compazine Other (See Comments)    Twitching and spasms of neck muscles  . Penicillins Rash    Reaction under 25    I personally reviewed active problem list, medication list, allergies, family history, social history, health maintenance with the patient/caregiver today.   ROS  Constitutional: Negative for fever or weight change.  Respiratory: Negative for cough and shortness of breath.   Cardiovascular: Negative for chest pain or palpitations.  Gastrointestinal: Negative for abdominal pain, no bowel changes.  Musculoskeletal: Negative for gait problem or joint swelling.  Skin: Negative for rash.  Neurological: Negative for dizziness or headache.  No other specific complaints in a complete review of systems (except as listed in HPI above).  Objective  Vitals:   11/13/20 0947  BP: 140/84  Pulse: 96  Resp: 17  Temp: 98.1 F (36.7 C)  TempSrc: Oral  SpO2: 97%  Weight: 274 lb 3.2 oz (124.4 kg)  Height: 5\' 4"  (1.626 m)    Body mass index is 47.07 kg/m.  Physical  Exam  Constitutional: Patient appears well-developed and well-nourished. Obese No distress.  HEENT: head atraumatic, normocephalic, pupils equal and reactive to light,  neck supple Cardiovascular: Normal rate, regular rhythm and normal heart sounds.  No murmur heard. No BLE edema. Pulmonary/Chest: Effort normal and breath sounds normal. No respiratory distress. Abdominal: Soft.  There is no tenderness. Psychiatric: Patient has a normal mood and affect. behavior is normal. Judgment and thought content normal.  Recent Results (from the past 2160 hour(s))  POCT HgB A1C     Status: Abnormal   Collection Time: 08/31/20  4:52 PM  Result Value Ref Range   Hemoglobin A1C 7.0 (A) 4.0 - 5.6 %   HbA1c POC (<> result, manual entry)     HbA1c, POC (prediabetic range)     HbA1c, POC (controlled diabetic range)      Diabetic Foot Exam: Diabetic Foot Exam - Simple   Simple Foot Form Diabetic Foot exam was performed with the following findings: Yes 11/13/2020 10:17 AM  Visual Inspection See comments: Yes Sensation Testing Intact to touch and monofilament testing bilaterally: Yes Pulse Check Posterior Tibialis and Dorsalis pulse intact bilaterally: Yes Comments Hammer toe 3rd right toe, and some corn formation      PHQ2/9: Depression screen [facility] 2/9 11/13/2020 08/31/2020 06/13/2020 04/11/2020 02/01/2020  Decreased Interest 2 1 1 1 1   Down, Depressed, Hopeless 1 1 1 1 1   PHQ - 2 Score 3 2 2 2 2   Altered sleeping 1 0 0 3 0  Tired, decreased energy 2 1 1 2 1   Change in appetite 2 3 1 1  0  Feeling bad or failure about yourself  1 1 1 2 1   Trouble concentrating 0 0 0 0 0  Moving slowly or fidgety/restless 1 0 0 1 0  Suicidal thoughts 0 0 0 0 0  PHQ-9 Score 10 7 5 11 4   Difficult doing work/chores - Somewhat difficult Somewhat difficult Somewhat difficult Not difficult at all  Some recent data might be hidden    phq 9 is positive   Fall Risk: Fall  Risk  11/13/2020 08/31/2020 06/13/2020  04/11/2020 02/01/2020  Falls in the past year? 0 0 0 0 0  Number falls in past yr: 0 0 0 0 0  Injury with Fall? 0 0 0 0 0  Follow up - - - - Falls evaluation completed     Functional Status Survey: Is the patient deaf or have difficulty hearing?: No Does the patient have difficulty seeing, even when wearing glasses/contacts?: Yes Does the patient have difficulty concentrating, remembering, or making decisions?: No Does the patient have difficulty walking or climbing stairs?: Yes Does the patient have difficulty dressing or bathing?: Yes Does the patient have difficulty doing errands alone such as visiting a doctor's office or shopping?: No    Assessment & Plan  1. Diabetic peripheral neuropathy associated with type 2 diabetes mellitus ([place])  stable  2. GAD (generalized anxiety disorder)  She will come in for follow up in Feb, she will call next month for her alprazolam refill   3. Benign hypertension  - lisinopril (ZESTRIL) 20 MG tablet; Take 1 tablet (20 mg total) by mouth daily.  Dispense: 90 tablet; Refill: 0  4. Fibromyalgia  Worse during winter months   5. Bilateral primary osteoarthritis of knee  Under the care of Ortho   6. Hypertension associated with diabetes ([place])  bp towards high end of normal, we will adjust dose to 20 mg and monitor  7. Primary insomnia   8. Morbid obesity with BMI of 40.0-44.9, adult [facility])  Discussed with the patient the risk posed by an increased BMI. Discussed importance of portion control, calorie counting and at least 150 minutes of physical activity weekly. Avoid sweet beverages and drink more water. Eat at least 6 servings of fruit and vegetables daily

## 2020-11-13 ENCOUNTER — Ambulatory Visit: Payer: Commercial Managed Care - PPO | Admitting: Family Medicine

## 2020-11-13 ENCOUNTER — Other Ambulatory Visit: Payer: Self-pay

## 2020-11-13 ENCOUNTER — Encounter: Payer: Self-pay | Admitting: Family Medicine

## 2020-11-13 VITALS — BP 140/84 | HR 96 | Temp 98.1°F | Resp 17 | Ht 64.0 in | Wt 274.2 lb

## 2020-11-13 DIAGNOSIS — F5101 Primary insomnia: Secondary | ICD-10-CM

## 2020-11-13 DIAGNOSIS — F411 Generalized anxiety disorder: Secondary | ICD-10-CM | POA: Diagnosis not present

## 2020-11-13 DIAGNOSIS — I152 Hypertension secondary to endocrine disorders: Secondary | ICD-10-CM

## 2020-11-13 DIAGNOSIS — E1142 Type 2 diabetes mellitus with diabetic polyneuropathy: Secondary | ICD-10-CM

## 2020-11-13 DIAGNOSIS — M797 Fibromyalgia: Secondary | ICD-10-CM

## 2020-11-13 DIAGNOSIS — E1159 Type 2 diabetes mellitus with other circulatory complications: Secondary | ICD-10-CM

## 2020-11-13 DIAGNOSIS — Z6841 Body Mass Index (BMI) 40.0 and over, adult: Secondary | ICD-10-CM

## 2020-11-13 DIAGNOSIS — M170 Bilateral primary osteoarthritis of knee: Secondary | ICD-10-CM

## 2020-11-13 DIAGNOSIS — I10 Essential (primary) hypertension: Secondary | ICD-10-CM | POA: Diagnosis not present

## 2020-11-13 MED ORDER — LISINOPRIL 20 MG PO TABS: 20.0000 mg | ORAL_TABLET | Freq: Every day | ORAL | 0 refills | Status: DC

## 2020-11-24 ENCOUNTER — Encounter: Payer: Self-pay | Admitting: Family Medicine

## 2020-11-26 ENCOUNTER — Other Ambulatory Visit: Payer: Self-pay | Admitting: Family Medicine

## 2020-12-03 ENCOUNTER — Ambulatory Visit: Payer: Self-pay | Admitting: Family Medicine

## 2020-12-06 ENCOUNTER — Encounter: Payer: Self-pay | Admitting: Family Medicine

## 2020-12-07 ENCOUNTER — Other Ambulatory Visit: Payer: Self-pay | Admitting: Family Medicine

## 2020-12-07 DIAGNOSIS — E1142 Type 2 diabetes mellitus with diabetic polyneuropathy: Secondary | ICD-10-CM

## 2020-12-07 MED ORDER — XULTOPHY 100-3.6 UNIT-MG/ML ~~LOC~~ SOPN: 50.0000 [IU] | PEN_INJECTOR | Freq: Every day | SUBCUTANEOUS | 1 refills | Status: DC

## 2020-12-12 ENCOUNTER — Ambulatory Visit: Payer: Self-pay | Admitting: Family Medicine

## 2020-12-16 ENCOUNTER — Other Ambulatory Visit: Payer: Self-pay | Admitting: Family Medicine

## 2020-12-16 DIAGNOSIS — F411 Generalized anxiety disorder: Secondary | ICD-10-CM

## 2020-12-16 NOTE — Telephone Encounter (Signed)
Requested medication (s) are due for refill today: yes  Requested medication (s) are on the active medication list:yes   Last refill:  10/24/2020  Future visit scheduled: no  Notes to clinic:  this refill cannot be delegated    Requested Prescriptions  Pending Prescriptions Disp Refills   ALPRAZolam (XANAX XR) 0.5 MG 24 hr tablet [Pharmacy Med Name: ALPRAZolam ER 0.5 MG Oral Tablet Extended Release 24 Hour] 30 tablet 0    Sig: Take 1 tablet by mouth once daily      Not Delegated - Psychiatry:  Anxiolytics/Hypnotics Failed - 12/16/2020  5:27 PM      Failed - This refill cannot be delegated      Failed - Urine Drug Screen completed in last 360 days      Passed - Valid encounter within last 6 months    Recent Outpatient Visits           1 month ago Diabetic peripheral neuropathy associated with type 2 diabetes mellitus [facility])   [facility] Steele Sizer, MD   3 months ago Diabetic peripheral neuropathy associated with type 2 diabetes mellitus [facility])   [facility] Steele Sizer, MD   6 months ago Benign hypertension   [facility] Steele Sizer, MD   8 months ago DDD (degenerative disc disease), cervical   [facility] Steele Sizer, MD   10 months ago Benign hypertension   [facility] Steele Sizer, MD

## 2020-12-17 ENCOUNTER — Other Ambulatory Visit: Payer: Self-pay

## 2020-12-17 NOTE — Telephone Encounter (Signed)
They no longer sample xultophy

## 2020-12-18 ENCOUNTER — Encounter: Payer: Self-pay | Admitting: Family Medicine

## 2020-12-20 ENCOUNTER — Other Ambulatory Visit: Payer: Self-pay | Admitting: Family Medicine

## 2020-12-20 ENCOUNTER — Telehealth: Payer: Self-pay | Admitting: Family Medicine

## 2020-12-20 ENCOUNTER — Other Ambulatory Visit: Payer: Self-pay

## 2020-12-20 ENCOUNTER — Encounter: Payer: Self-pay | Admitting: Family Medicine

## 2020-12-20 DIAGNOSIS — F411 Generalized anxiety disorder: Secondary | ICD-10-CM

## 2020-12-20 MED ORDER — ALPRAZOLAM ER 0.5 MG PO TB24: 0.5000 mg | ORAL_TABLET | Freq: Every day | ORAL | 2 refills | Status: DC

## 2020-12-20 NOTE — Telephone Encounter (Signed)
Medication Refill - Medication: "Prednisone Taper"  Pt states that her insurance runs out tomorrow. She needs this for her torn rotator cuff   Has the patient contacted their pharmacy? Yes.   (Agent: If no, request that the patient contact the pharmacy for the refill.) (Agent: If yes, when and what did the pharmacy advise?)  Preferred Pharmacy (with phone number or street name):  [facility] [address] [place] - [address]  [address] [place] [address]  Phone: [phone] Fax: [phone]     Agent: Please be advised that RX refills may take up to 3 business days. We ask that you follow-up with your pharmacy.

## 2020-12-24 NOTE — Telephone Encounter (Signed)
Tried to call pt to inform her of message and MB is full

## 2021-01-02 ENCOUNTER — Ambulatory Visit: Payer: Self-pay | Admitting: Family Medicine

## 2021-01-08 ENCOUNTER — Ambulatory Visit: Payer: Self-pay | Admitting: Family Medicine

## 2021-01-08 ENCOUNTER — Encounter: Payer: Self-pay | Admitting: Family Medicine

## 2021-01-09 ENCOUNTER — Other Ambulatory Visit: Payer: Self-pay

## 2021-01-11 ENCOUNTER — Encounter: Payer: Self-pay | Admitting: Family Medicine

## 2021-01-14 ENCOUNTER — Encounter: Payer: Self-pay | Admitting: Oncology

## 2021-01-14 ENCOUNTER — Telehealth: Payer: Self-pay | Admitting: Oncology

## 2021-01-14 DIAGNOSIS — R0789 Other chest pain: Secondary | ICD-10-CM

## 2021-01-14 DIAGNOSIS — U071 COVID-19: Secondary | ICD-10-CM

## 2021-01-14 MED ORDER — ALBUTEROL SULFATE (2.5 MG/3ML) 0.083% IN NEBU: 2.5000 mg | INHALATION_SOLUTION | Freq: Four times a day (QID) | RESPIRATORY_TRACT | 1 refills | Status: DC | PRN

## 2021-01-14 NOTE — Progress Notes (Signed)
Lindsay Mcdonald, perren are scheduled for a virtual visit with your provider today.    Just as we do with appointments in the office, we must obtain your consent to participate.  Your consent will be active for this visit and any virtual visit you may have with one of our providers in the next 365 days.    If you have a MyChart account, I can also send a copy of this consent to you electronically.  All virtual visits are billed to your insurance company just like a traditional visit in the office.  As this is a virtual visit, video technology does not allow for your provider to perform a traditional examination.  This may limit your provider's ability to fully assess your condition.  If your provider identifies any concerns that need to be evaluated in person or the need to arrange testing such as labs, EKG, etc, we will make arrangements to do so.    Although advances in technology are sophisticated, we cannot ensure that it will always work on either your end or our end.  If the connection with a video visit is poor, we may have to switch to a telephone visit.  With either a video or telephone visit, we are not always able to ensure that we have a secure connection.   I need to obtain your verbal consent now.   Are you willing to proceed with your visit today?   Clydell Alberts has provided verbal consent on 01/14/2021 for a virtual visit (video or telephone).   Jacquelin Hawking, NP 01/14/2021  7:27 PM  Virtual Visit via Video Note  I connected with Lindsay Mcdonald on 01/14/21 at  6:00 PM EST by a video enabled telemedicine application and verified that I am speaking with the correct person using two identifiers.  Location: Patient: Home Provider: Clinic    I discussed the limitations of evaluation and management by telemedicine and the availability of in person appointments. The patient expressed understanding and agreed to proceed.  History of Present Illness: Lindsay Mcdonald is a 60 year old  female with past medical history significant for hypertension, diabetes, depression, hyperlipidemia, insomnia and morbid obesity with a BMI greater than 40.  She was diagnosed with COVID-19 over 1 week ago.  Initial symptoms included fever, sinus congestion and diarrhea.  Symptoms have improved if not resolved.  Today, she reports 1 single episode of chest tightness with exertion after taking her trash down 2 flights of stairs and to the dumpsters.  Symptoms resolved with rest.  She has not had any additional chest discomfort since.  She has no past medical history of chest pain.  Observations/Objective: Review of Systems  Constitutional: Positive for malaise/fatigue.  Respiratory: Positive for shortness of breath.   Cardiovascular: Positive for chest pain.   Physical Exam Constitutional:      General: She is not in acute distress.    Appearance: Normal appearance. She is obese. She is not ill-appearing or toxic-appearing.  Pulmonary:     Effort: No tachypnea.  Neurological:     Mental Status: She is alert.    Assessment and Plan:  Chest discomfort with exertion: -Likely secondary to recent COVID-19 infection and residual inflammation. -She is currently not on any medications for treatment of the Covid. -She is unvaccinated. -Has intermittent shortness of breath with exertion;no recent fevers -Symptoms consistent with resolving infection and restrictive airway disease. -Given chest discomfort was on one occasion and resolved fairly quickly,  I do not believe she needs to  be seen in the emergency room. -We did discuss red flag symptoms including worsening shortness of breath or persistent chest pain.  She understands and agrees. -Sent prescription in for albuterol inhaler to use as needed with exertion. -Given symptom onset was greater than 1 week ago and she is no longer having fevers, do not believe she needs an antibiotic at this time.  Follow Up Instructions: -Patient needs to  follow-up with PCP in the next week or so.    I discussed the assessment and treatment plan with the patient. The patient was provided an opportunity to ask questions and all were answered. The patient agreed with the plan and demonstrated an understanding of the instructions.   The patient was advised to call back or seek an in-person evaluation if the symptoms worsen or if the condition fails to improve as anticipated.  I provided 15 minutes of face-to-face time during this encounter.   Jacquelin Hawking, NP

## 2021-01-15 ENCOUNTER — Encounter: Payer: Self-pay | Admitting: Family Medicine

## 2021-01-16 ENCOUNTER — Other Ambulatory Visit: Payer: Self-pay | Admitting: Oncology

## 2021-01-16 ENCOUNTER — Encounter: Payer: Self-pay | Admitting: Oncology

## 2021-01-16 MED ORDER — ALBUTEROL SULFATE HFA 108 (90 BASE) MCG/ACT IN AERS: 2.0000 | INHALATION_SPRAY | Freq: Four times a day (QID) | RESPIRATORY_TRACT | 2 refills | Status: DC | PRN

## 2021-01-31 ENCOUNTER — Encounter: Payer: Self-pay | Admitting: Family Medicine

## 2021-02-07 ENCOUNTER — Ambulatory Visit: Payer: Commercial Managed Care - PPO | Admitting: Family Medicine

## 2021-02-07 ENCOUNTER — Encounter: Payer: Self-pay | Admitting: Family Medicine

## 2021-02-20 NOTE — Progress Notes (Unsigned)
Name: Lindsay Mcdonald   MRN: [id number]    DOB: 1961-10-16   Date:02/22/2021       Progress Note  Subjective  Chief Complaint  Follow up   HPI   Mild Major Depression/insomnia: she is afraid of taking medications, explained again that SSRI is safer than BZD.   She has been on alprazolam XR and is afraid to stop taking medication, we will continue current dose  . Phq 9 is elevated. Denies suicidal thoughts or ideation. She is talking to a Youth worker and seems to be helpful.   OA: seeing Dr. Candelaria Stagers and had steroid injections on both knees ,no effusion or redness, she has some pain on right popliteal fossa, she has a bakers cysts, she has an appointment with Dr. Candelaria Stagers coming up   [place]: she has a long history of feeling aching and tenderworse on trunk and upper extremities. . Pain on shoulders, arms, stiff on her hips . Feels a little stiff when first gets up, she has some mental fogginess,she has tried Lyrica and Duloxetine but stopped on her own.  She stopped taking flexeril and now takes baclofen prn   DMII:glucose lately has beenelevated due to prednisoneA1C wentfrom 6.6 % to 7.9%to 8.1% ,6.7 %7 % today is down to 6.6 % She had a gap in insurance and is down on Xultophy to 25 units per day, also taking metformin, doing well at this time. No polyphagia, polydipsia or polyuria. She states occasional has tingling on her toes . No hypoglycemic episodes. She also has associated HTN, dyslipidemia, neuropathy and obesity.   Snoring: waking up gasping for air, has DM, HTN, and obesity, she will stop by for ESS , we scheduled a sleep study but she felt uncomfortable but will try to re-scheduled it   HTN: she is now on lisiinopril 20 mg and bp is still not at goal, we will increase dose to 20 mg, no side effects. No chest pain, since covid -19 in January has intermittent episode of SOB but is improving.   Hyperlipidemia: she is back on Pravastatin but only a few times a week now, we  will recheck labs today   Morbid Obesity:BMI is still high. She has been packing her lunch, making more salads, weight is stable.   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 Pain was sharp and shooting down left elbow, it was affecting her sleep, she saw Dr. Rudene Christians and he recommend evaluation by neurosurgeon and PT , but neurosurgeon advised follow up with Dr. 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 She is holding off for now Taking diclofenac prn, she is thinking about PT first   Patient Active Problem List   Diagnosis Date Noted  . Hypertension associated with diabetes ([place]) 08/31/2020  . History of marijuana use 03/30/2019  . Fibromyalgia 12/31/2018  . Venous vascular malformations 03/27/2016  . Diabetic peripheral neuropathy associated with type 2 diabetes mellitus ([place]) 09/26/2015  . Non compliance w medication regimen 09/26/2015  . Seasonal allergic rhinitis 09/26/2015  . Muscle spasms of neck 06/19/2015  . Insomnia 06/19/2015  . Primary osteoarthritis of right knee 06/17/2015  . Depression, major, recurrent, mild ([place]) 06/17/2015  . Dyslipidemia 06/17/2015  . Benign hypertension 06/17/2015  . Morbid obesity with BMI of 40.0-44.9, adult (  [place]) 06/17/2015  . Vitamin D deficiency 06/17/2015    Past Surgical History:  Procedure Laterality Date  . BREAST REDUCTION SURGERY    . ETHMOIDECTOMY Right 05/16/2020   Procedure: ETHMOIDECTOMY;  Surgeon: Margaretha Sheffield, MD;  Location: [facility] ORS;  Service: ENT;  Laterality: Right;  . EXCISION ORAL TUMOR N/A 05/16/2020   Procedure: EXCISION OF SOFT PALATE PAPILLOMA;  Surgeon: Margaretha Sheffield, MD;  Location: [facility] ORS;  Service: ENT;  Laterality: N/A;  . FRONTAL SINUS EXPLORATION Right 05/16/2020   Procedure: FRONTAL SINUS EXPLORATION;  Surgeon:  Margaretha Sheffield, MD;  Location: [facility] ORS;  Service: ENT;  Laterality: Right;  . IMAGE GUIDED SINUS SURGERY N/A 05/16/2020   Procedure: IMAGE GUIDED SINUS SURGERY;  Surgeon: Margaretha Sheffield, MD;  Location: [facility] ORS;  Service: ENT;  Laterality: N/A;  . MAXILLARY ANTROSTOMY Bilateral 05/16/2020   Procedure: MAXILLARY ANTROSTOMY with tissue removal;  Surgeon: Margaretha Sheffield, MD;  Location: [facility] ORS;  Service: ENT;  Laterality: Bilateral;  . nasal endoscopic    . OOPHORECTOMY  ?   one ovary removed   . REDUCTION MAMMAPLASTY Bilateral 2000  . removal of ovary    . SPHENOIDECTOMY Right 05/16/2020   Procedure: SPHENOIDECTOMY;  Surgeon: Margaretha Sheffield, MD;  Location: [facility] ORS;  Service: ENT;  Laterality: Right;    Family History  Problem Relation Age of Onset  . Diabetes Mother   . Cancer Mother 77       Breast  . Breast cancer Mother 76  . Gout Mother   . Diabetes Daughter        Oldest Daughter  . Stroke Maternal Uncle   . Breast cancer Maternal Aunt 5  . Diabetes Father   . Kidney cancer Maternal Aunt   . Kidney disease Maternal Aunt     Social History   Tobacco Use  . Smoking status: Never Smoker  . Smokeless tobacco: Never Used  Substance Use Topics  . Alcohol use: No    Alcohol/week: 0.0 standard drinks     Current Outpatient Medications:  .  albuterol (VENTOLIN HFA) 108 (90 Base) MCG/ACT inhaler, Inhale 2 puffs into the lungs every 6 (six) hours as needed for wheezing or shortness of breath., Disp: 8 g, Rfl: 2 .  ALPRAZolam (XANAX XR) 0.5 MG 24 hr tablet, Take 1 tablet (0.5 mg total) by mouth daily., Disp: 30 tablet, Rfl: 2 .  aspirin EC 81 MG tablet, Take 81 mg by mouth daily. Swallow whole., Disp: , Rfl:  .  diclofenac (VOLTAREN) 75 MG EC tablet, Take 1 tablet (75 mg total) by mouth 2 (two) times daily., Disp: 30 tablet, Rfl: 0 .  fluticasone (FLONASE) 50 MCG/ACT nasal spray, Place 2 sprays into both nostrils daily., Disp: 16 g, Rfl: 6 .  Insulin Degludec-Liraglutide (XULTOPHY)  100-3.6 UNIT-MG/ML SOPN, Inject 50 Units into the skin daily., Disp: 45 mL, Rfl: 1 .  Insulin Pen Needle 32G X 6 MM MISC, 1 each by Does not apply route daily., Disp: 100 each, Rfl: 1 .  lisinopril (ZESTRIL) 20 MG tablet, Take 1 tablet (20 mg total) by mouth daily., Disp: 90 tablet, Rfl: 0 .  Melatonin 10 MG TABS, Take 10 mg by mouth at bedtime as needed (sleep). , Disp: , Rfl:  .  metFORMIN (GLUCOPHAGE-XR) 750 MG 24 hr tablet, Take 2 tablets (1,500 mg total) by mouth daily with breakfast., Disp: 180 tablet, Rfl: 1 .  pravastatin (PRAVACHOL) 20 MG tablet, TAKE 1 TABLET BY MOUTH THREE TIMES A WEEK (Patient taking  differently: Take 20 mg by mouth 3 (three) times a week.), Disp: 36 tablet, Rfl: 3  Allergies  Allergen Reactions  . Simvastatin     Muscle cramps   . Atorvastatin Other (See Comments)    muscle cramps  . Compazine Other (See Comments)    Twitching and spasms of neck muscles  . Penicillins Rash    Reaction under 25    I personally reviewed active problem list, medication list, allergies, family history, social history, health maintenance with the patient/caregiver today.   ROS  Constitutional: Negative for fever or weight change.  Respiratory: Negative for cough and shortness of breath.   Cardiovascular: Negative for chest pain or palpitations.  Gastrointestinal: Negative for abdominal pain, no bowel changes.  Musculoskeletal: Negative for gait problem or joint swelling.  Skin: Negative for rash.  Neurological: Negative for dizziness or headache.  No other specific complaints in a complete review of systems (except as listed in HPI above).  Objective  Vitals:   02/22/21 1054  BP: 138/86  Pulse: 96  Resp: 16  Temp: 98.3 F (36.8 C)  TempSrc: Oral  SpO2: 98%  Weight: 276 lb 12.8 oz (125.6 kg)  Height: 5\' 4"  (1.626 m)    Body mass index is 47.51 kg/m.  Physical Exam  Constitutional: Patient appears well-developed and well-nourished. Obese No distress.   HEENT: head atraumatic, normocephalic, pupils equal and reactive to light,  neck supple Cardiovascular: Normal rate, regular rhythm and normal heart sounds.  No murmur heard. No BLE edema. Pulmonary/Chest: Effort normal and breath sounds normal. No respiratory distress. Abdominal: Soft.  There is no tenderness. Psychiatric: Patient has a normal mood and affect. behavior is normal. Judgment and thought content normal.  Recent Results (from the past 2160 hour(s))  POCT HgB A1C     Status: Abnormal   Collection Time: 02/22/21 10:54 AM  Result Value Ref Range   Hemoglobin A1C 6.6 (A) 4.0 - 5.6 %   HbA1c POC (<> result, manual entry)     HbA1c, POC (prediabetic range)     HbA1c, POC (controlled diabetic range)        PHQ2/9: Depression screen [facility] 2/9 02/22/2021 11/13/2020 08/31/2020 06/13/2020 04/11/2020  Decreased Interest 1 2 1 1 1   Down, Depressed, Hopeless 1 1 1 1 1   PHQ - 2 Score 2 3 2 2 2   Altered sleeping 0 1 0 0 3  Tired, decreased energy 1 2 1 1 2   Change in appetite 1 2 3 1 1   Feeling bad or failure about yourself  1 1 1 1 2   Trouble concentrating 0 0 0 0 0  Moving slowly or fidgety/restless 0 1 0 0 1  Suicidal thoughts 0 0 0 0 0  PHQ-9 Score 5 10 7 5 11   Difficult doing work/chores - - Somewhat difficult Somewhat difficult Somewhat difficult  Some recent data might be hidden    phq 9 is positive   Fall Risk: Fall Risk  02/22/2021 11/13/2020 08/31/2020 06/13/2020 04/11/2020  Falls in the past year? 0 0 0 0 0  Number falls in past yr: 0 0 0 0 0  Injury with Fall? 0 0 0 0 0  Follow up - - - - -     Functional Status Survey: Is the patient deaf or have difficulty hearing?: No Does the patient have difficulty seeing, even when wearing glasses/contacts?: Yes Does the patient have difficulty concentrating, remembering, or making decisions?: Yes Does the patient have difficulty walking or climbing stairs?:  No Does the patient have difficulty dressing or bathing?: No Does the  patient have difficulty doing errands alone such as visiting a doctor's office or shopping?: No    Assessment & Plan  1. Diabetic peripheral neuropathy associated with type 2 diabetes mellitus (HCC)  - POCT HgB A1C - metFORMIN (GLUCOPHAGE-XR) 750 MG 24 hr tablet; Take 2 tablets (1,500 mg total) by mouth daily with breakfast.  Dispense: 180 tablet; Refill: 1 - Insulin Pen Needle 32G X 6 MM MISC; 1 each by Does not apply route daily.  Dispense: 100 each; Refill: 1 - Insulin Degludec-Liraglutide (XULTOPHY) 100-3.6 UNIT-MG/ML SOPN; Inject 50 Units into the skin daily.  Dispense: 45 mL; Refill: 1  2. Benign hypertension  - lisinopril (ZESTRIL) 40 MG tablet; Take 1 tablet (40 mg total) by mouth daily.  Dispense: 90 tablet; Refill: 1  3. Fibromyalgia  Taking baclofen prn   4. GAD (generalized anxiety disorder)   5. Hypertension associated with diabetes ([place])  We will adjust dose of medication   6. Depression, major, recurrent, mild ([place])   7. Primary insomnia  Stable, taking melatonin at night, she has difficulty staying asleep because of shoulder pain   8. Morbid obesity with BMI of 40.0-44.9, adult [facility])  Discussed with the patient the risk posed by an increased BMI. Discussed importance of portion control, calorie counting and at least 150 minutes of physical activity weekly. Avoid sweet beverages and drink more water. Eat at least 6 servings of fruit and vegetables daily   9. Dyslipidemia  - pravastatin (PRAVACHOL) 20 MG tablet; Take 1 tablet (20 mg total) by mouth 3 (three) times a week.  Dispense: 54 tablet; Refill: 0  10. Dyslipidemia associated with type 2 diabetes mellitus (HCC)  - pravastatin (PRAVACHOL) 20 MG tablet; Take 1 tablet (20 mg total) by mouth 3 (three) times a week.  Dispense: 54 tablet; Refill: 0

## 2021-02-22 ENCOUNTER — Ambulatory Visit: Payer: Commercial Managed Care - PPO | Admitting: Family Medicine

## 2021-02-22 ENCOUNTER — Other Ambulatory Visit: Payer: Self-pay

## 2021-02-22 ENCOUNTER — Other Ambulatory Visit: Payer: Self-pay | Admitting: Family Medicine

## 2021-02-22 ENCOUNTER — Encounter: Payer: Self-pay | Admitting: Family Medicine

## 2021-02-22 ENCOUNTER — Telehealth: Payer: Self-pay | Admitting: Family Medicine

## 2021-02-22 VITALS — BP 138/86 | HR 96 | Temp 98.3°F | Resp 16 | Ht 64.0 in | Wt 276.8 lb

## 2021-02-22 DIAGNOSIS — M797 Fibromyalgia: Secondary | ICD-10-CM | POA: Diagnosis not present

## 2021-02-22 DIAGNOSIS — I10 Essential (primary) hypertension: Secondary | ICD-10-CM | POA: Diagnosis not present

## 2021-02-22 DIAGNOSIS — F5101 Primary insomnia: Secondary | ICD-10-CM

## 2021-02-22 DIAGNOSIS — E1159 Type 2 diabetes mellitus with other circulatory complications: Secondary | ICD-10-CM

## 2021-02-22 DIAGNOSIS — E1142 Type 2 diabetes mellitus with diabetic polyneuropathy: Secondary | ICD-10-CM | POA: Diagnosis not present

## 2021-02-22 DIAGNOSIS — Z79899 Other long term (current) drug therapy: Secondary | ICD-10-CM

## 2021-02-22 DIAGNOSIS — I152 Hypertension secondary to endocrine disorders: Secondary | ICD-10-CM

## 2021-02-22 DIAGNOSIS — F411 Generalized anxiety disorder: Secondary | ICD-10-CM

## 2021-02-22 DIAGNOSIS — F330 Major depressive disorder, recurrent, mild: Secondary | ICD-10-CM

## 2021-02-22 DIAGNOSIS — Z6841 Body Mass Index (BMI) 40.0 and over, adult: Secondary | ICD-10-CM

## 2021-02-22 DIAGNOSIS — E785 Hyperlipidemia, unspecified: Secondary | ICD-10-CM

## 2021-02-22 DIAGNOSIS — E1169 Type 2 diabetes mellitus with other specified complication: Secondary | ICD-10-CM

## 2021-02-22 LAB — POCT GLYCOSYLATED HEMOGLOBIN (HGB A1C): Hemoglobin A1C: 6.6 % — AB (ref 4.0–5.6)

## 2021-02-22 MED ORDER — TRAZODONE HCL 50 MG PO TABS: 25.0000 mg | ORAL_TABLET | Freq: Every evening | ORAL | 0 refills | Status: DC | PRN

## 2021-02-22 MED ORDER — METFORMIN HCL ER 750 MG PO TB24: 1500.0000 mg | ORAL_TABLET | Freq: Every day | ORAL | 1 refills | Status: DC

## 2021-02-22 MED ORDER — XULTOPHY 100-3.6 UNIT-MG/ML ~~LOC~~ SOPN
50.0000 [IU] | PEN_INJECTOR | Freq: Every day | SUBCUTANEOUS | 1 refills | Status: DC
Start: 2021-02-22 — End: 2021-05-21

## 2021-02-22 MED ORDER — PRAVASTATIN SODIUM 20 MG PO TABS: 20.0000 mg | ORAL_TABLET | ORAL | 0 refills | Status: DC

## 2021-02-22 MED ORDER — LISINOPRIL 40 MG PO TABS: 40.0000 mg | ORAL_TABLET | Freq: Every day | ORAL | 1 refills | Status: DC

## 2021-02-22 MED ORDER — INSULIN PEN NEEDLE 32G X 6 MM MISC: 1.0000 | Freq: Every day | 1 refills | Status: DC

## 2021-02-22 NOTE — Telephone Encounter (Signed)
Patient was seen today and forgot to ask if PCP can prescribe Trazodone due to the symptoms discussed at today OV. Please advise patient directly [place], [place] Phone:  [phone]  Fax:  [phone]

## 2021-02-22 NOTE — Addendum Note (Signed)
Addended by: Carlene Coria on: 02/22/2021 11:29 AM   Modules accepted: Orders

## 2021-02-23 LAB — COMPLETE METABOLIC PANEL WITH GFR
AG Ratio: 1.4 (calc) (ref 1.0–2.5)
ALT: 13 U/L (ref 6–29)
AST: 16 U/L (ref 10–35)
Albumin: 3.8 g/dL (ref 3.6–5.1)
Alkaline phosphatase (APISO): 82 U/L (ref 37–153)
BUN: 20 mg/dL (ref 7–25)
CO2: 27 mmol/L (ref 20–32)
Calcium: 9.7 mg/dL (ref 8.6–10.4)
Chloride: 106 mmol/L (ref 98–110)
Creat: 0.81 mg/dL (ref 0.50–1.05)
GFR, Est African American: 93 mL/min/{1.73_m2} (ref >=60)
GFR, Est Non African American: 81 mL/min/{1.73_m2} (ref >=60)
Globulin: 2.8 g/dL (calc) (ref 1.9–3.7)
Glucose, Bld: 123 mg/dL (ref 65–139)
Potassium: 4.4 mmol/L (ref 3.5–5.3)
Sodium: 141 mmol/L (ref 135–146)
Total Bilirubin: 0.3 mg/dL (ref 0.2–1.2)
Total Protein: 6.6 g/dL (ref 6.1–8.1)

## 2021-02-23 LAB — VITAMIN B12: Vitamin B-12: 350 pg/mL (ref 200–1100)

## 2021-02-23 LAB — LIPID PANEL
Cholesterol: 186 mg/dL (ref <200)
HDL: 60 mg/dL (ref >=50)
LDL Cholesterol (Calc): 110 mg/dL (calc) — ABNORMAL HIGH
Non-HDL Cholesterol (Calc): 126 mg/dL (calc) (ref <130)
Total CHOL/HDL Ratio: 3.1 (calc) (ref <5.0)
Triglycerides: 71 mg/dL (ref <150)

## 2021-02-23 LAB — MICROALBUMIN / CREATININE URINE RATIO
Creatinine, Urine: 152 mg/dL (ref 20–275)
Microalb Creat Ratio: 3 mcg/mg creat (ref <30)
Microalb, Ur: 0.5 mg/dL

## 2021-02-23 LAB — TSH: TSH: 3 mIU/L (ref 0.40–4.50)

## 2021-03-11 ENCOUNTER — Encounter: Payer: Self-pay | Admitting: Family Medicine

## 2021-03-11 ENCOUNTER — Other Ambulatory Visit: Payer: Self-pay

## 2021-03-11 DIAGNOSIS — F411 Generalized anxiety disorder: Secondary | ICD-10-CM

## 2021-03-13 MED ORDER — ALPRAZOLAM ER 0.5 MG PO TB24: 0.5000 mg | ORAL_TABLET | Freq: Every day | ORAL | 2 refills | Status: DC

## 2021-03-24 ENCOUNTER — Encounter: Payer: Self-pay | Admitting: Family Medicine

## 2021-04-05 ENCOUNTER — Encounter: Payer: Self-pay | Admitting: Family Medicine

## 2021-04-09 ENCOUNTER — Encounter: Payer: Self-pay | Admitting: Family Medicine

## 2021-04-16 ENCOUNTER — Encounter: Payer: Self-pay | Admitting: Family Medicine

## 2021-04-17 ENCOUNTER — Other Ambulatory Visit: Payer: Self-pay | Admitting: Family Medicine

## 2021-04-17 DIAGNOSIS — M797 Fibromyalgia: Secondary | ICD-10-CM

## 2021-04-19 DIAGNOSIS — M7542 Impingement syndrome of left shoulder: Secondary | ICD-10-CM | POA: Diagnosis not present

## 2021-04-19 DIAGNOSIS — M25512 Pain in left shoulder: Secondary | ICD-10-CM | POA: Diagnosis not present

## 2021-04-19 DIAGNOSIS — G8929 Other chronic pain: Secondary | ICD-10-CM | POA: Diagnosis not present

## 2021-04-19 DIAGNOSIS — M25562 Pain in left knee: Secondary | ICD-10-CM | POA: Diagnosis not present

## 2021-04-19 DIAGNOSIS — M1712 Unilateral primary osteoarthritis, left knee: Secondary | ICD-10-CM | POA: Diagnosis not present

## 2021-05-21 ENCOUNTER — Encounter: Payer: Self-pay | Admitting: Unknown Physician Specialty

## 2021-05-21 ENCOUNTER — Ambulatory Visit: Payer: BC Managed Care – PPO | Admitting: Unknown Physician Specialty

## 2021-05-21 ENCOUNTER — Other Ambulatory Visit: Payer: Self-pay

## 2021-05-21 VITALS — BP 138/86 | HR 96 | Temp 98.3°F | Resp 16 | Ht 64.0 in | Wt 273.7 lb

## 2021-05-21 DIAGNOSIS — N3941 Urge incontinence: Secondary | ICD-10-CM

## 2021-05-21 DIAGNOSIS — Z794 Long term (current) use of insulin: Secondary | ICD-10-CM

## 2021-05-21 DIAGNOSIS — F330 Major depressive disorder, recurrent, mild: Secondary | ICD-10-CM

## 2021-05-21 DIAGNOSIS — I10 Essential (primary) hypertension: Secondary | ICD-10-CM | POA: Diagnosis not present

## 2021-05-21 DIAGNOSIS — E119 Type 2 diabetes mellitus without complications: Secondary | ICD-10-CM | POA: Insufficient documentation

## 2021-05-21 DIAGNOSIS — Z6841 Body Mass Index (BMI) 40.0 and over, adult: Secondary | ICD-10-CM

## 2021-05-21 DIAGNOSIS — E1142 Type 2 diabetes mellitus with diabetic polyneuropathy: Secondary | ICD-10-CM

## 2021-05-21 DIAGNOSIS — E785 Hyperlipidemia, unspecified: Secondary | ICD-10-CM

## 2021-05-21 DIAGNOSIS — F411 Generalized anxiety disorder: Secondary | ICD-10-CM

## 2021-05-21 DIAGNOSIS — E1169 Type 2 diabetes mellitus with other specified complication: Secondary | ICD-10-CM | POA: Insufficient documentation

## 2021-05-21 DIAGNOSIS — F5101 Primary insomnia: Secondary | ICD-10-CM

## 2021-05-21 LAB — POCT GLYCOSYLATED HEMOGLOBIN (HGB A1C): Hemoglobin A1C: 7.7 % — AB (ref 4.0–5.6)

## 2021-05-21 MED ORDER — ALPRAZOLAM ER 0.5 MG PO TB24: 0.5000 mg | ORAL_TABLET | Freq: Every day | ORAL | 2 refills | Status: DC

## 2021-05-21 MED ORDER — EZETIMIBE 10 MG PO TABS: 10.0000 mg | ORAL_TABLET | Freq: Every day | ORAL | 3 refills | Status: DC

## 2021-05-21 MED ORDER — XULTOPHY 100-3.6 UNIT-MG/ML ~~LOC~~ SOPN: 50.0000 [IU] | PEN_INJECTOR | Freq: Every day | SUBCUTANEOUS | 1 refills | Status: DC

## 2021-05-21 NOTE — Assessment & Plan Note (Signed)
Taking Trazadone. Stopped as it is ineffective

## 2021-05-21 NOTE — Assessment & Plan Note (Signed)
Daily Xanax.  Does not want ot change or consider SSRI at this time

## 2021-05-21 NOTE — Assessment & Plan Note (Signed)
Statin intolerant.  Add Zetia 10 mg daily

## 2021-05-21 NOTE — Progress Notes (Signed)
BP 138/86   Pulse 96   Temp 98.3 F (36.8 C)   Resp 16   Ht 5\' 4"  (1.626 m)   Wt 273 lb 11.2 oz (124.1 kg)   SpO2 97%   BMI 46.98 kg/m    Subjective:    Patient ID: Lindsay Mcdonald, female    DOB: 10-Mar-1961, 59 y.o.   MRN: [id number]  HPI: Lindsay Mcdonald is a 60 y.o. female  Chief Complaint  Patient presents with  . Follow-up  . Anxiety  . Diabetes  . Hyperlipidemia   Diabetes: Taking 25 u of insulin.  Woul;d like to see how that is working Using medications without difficulties No hypoglycemic episodes No hyperglycemic episodes Feet problems: Blood Sugars averaging: Not checkng eye exam within last year Last Hgb A1C: 6.6 3 months ago  Hypertension  Using medications without difficulty Average home BPs Not checking   Using medication without problems or lightheadedness No chest pain with exertion or shortness of breath No Edema  Elevated Cholesterol Using medications without problems No Muscle aches  Diet/Exercise: Not working on this   Anxiety Takes Xanax on a daily basis.  She has tried SSRIs but hasn't stayed on them long enough and remains reluctant due to things she read.  Would like to continue present dose.    Depression screen [facility] 2/9 05/21/2021 02/22/2021 11/13/2020 08/31/2020 06/13/2020  Decreased Interest 1 1 2 1 1   Down, Depressed, Hopeless 1 1 1 1 1   PHQ - 2 Score 2 2 3 2 2   Altered sleeping 0 0 1 0 0  Tired, decreased energy 1 1 2 1 1   Change in appetite 0 1 2 3 1   Feeling bad or failure about yourself  0 1 1 1 1   Trouble concentrating 0 0 0 0 0  Moving slowly or fidgety/restless 0 0 1 0 0  Suicidal thoughts 0 0 0 0 0  PHQ-9 Score 3 5 10 7 5   Difficult doing work/chores Not difficult at all - - Somewhat difficult Somewhat difficult  Some recent data might be hidden   GAD 7 : Generalized Anxiety Score 08/31/2020 04/11/2020 09/28/2019 12/31/2018  Nervous, Anxious, on Edge 2 1 1 1   Control/stop worrying 3 1 1 1   Worry too much - different  things 3 1 1 1   Trouble relaxing 2 1 1 1   Restless 2 1 1 1   Easily annoyed or irritable 1 1 1  0  Afraid - awful might happen 2 1 1 1   Total GAD 7 Score 15 7 7 6   Anxiety Difficulty Somewhat difficult - Somewhat difficult -      Relevant past medical, surgical, family and social history reviewed and updated as indicated. Interim medical history since our last visit reviewed. Allergies and medications reviewed and updated.  Review of Systems  Genitourinary:       New onset urge incontinence.  Usually happens in the evening.      Per HPI unless specifically indicated above     Objective:    BP 138/86   Pulse 96   Temp 98.3 F (36.8 C)   Resp 16   Ht 5\' 4"  (1.626 m)   Wt 273 lb 11.2 oz (124.1 kg)   SpO2 97%   BMI 46.98 kg/m   Wt Readings from Last 3 Encounters:  05/21/21 273 lb 11.2 oz (124.1 kg)  02/22/21 276 lb 12.8 oz (125.6 kg)  11/13/20 274 lb 3.2 oz (124.4 kg)    Physical Exam Constitutional:  General: She is not in acute distress.    Appearance: Normal appearance. She is well-developed.  HENT:     Head: Normocephalic and atraumatic.  Eyes:     General: Lids are normal. No scleral icterus.       Right eye: No discharge.        Left eye: No discharge.     Conjunctiva/sclera: Conjunctivae normal.  Neck:     Vascular: No carotid bruit or JVD.  Cardiovascular:     Rate and Rhythm: Normal rate and regular rhythm.     Heart sounds: Normal heart sounds.  Pulmonary:     Effort: Pulmonary effort is normal.     Breath sounds: Normal breath sounds.  Abdominal:     Palpations: There is no hepatomegaly or splenomegaly.  Musculoskeletal:        General: Normal range of motion.     Cervical back: Normal range of motion and neck supple.  Skin:    General: Skin is warm and dry.     Coloration: Skin is not pale.     Findings: No rash.  Neurological:     Mental Status: She is alert and oriented to person, place, and time.  Psychiatric:        Behavior:  Behavior normal.        Thought Content: Thought content normal.        Judgment: Judgment normal.      Assessment & Plan:   Problem List Items Addressed This Visit      Unprioritized   Benign hypertension    Stable, continue present medications.        Relevant Medications   ezetimibe (ZETIA) 10 MG tablet   Depression, major, recurrent, mild (HCC)    Daily Xanax.  Does not want ot change or consider SSRI at this time      Relevant Medications   ALPRAZolam (XANAX XR) 0.5 MG 24 hr tablet   Hyperlipidemia    Statin intolerant.  Add Zetia 10 mg daily      Relevant Medications   ezetimibe (ZETIA) 10 MG tablet   Insomnia    Taking Trazadone. Stopped as it is ineffective      Morbid obesity with BMI of 40.0-44.9, adult ([place])    Interested in going to a weight clinic at [facility].  Not interested in diabetes education      Relevant Medications   Insulin Degludec-Liraglutide (XULTOPHY) 100-3.6 UNIT-MG/ML SOPN   Type 2 diabetes mellitus (HCC)    Hgb A1C is 7.7 today.  She had cut back her insulin due to cost.  She states she can go back up to 50 u now.        Relevant Medications   Insulin Degludec-Liraglutide (XULTOPHY) 100-3.6 UNIT-MG/ML SOPN   Other Relevant Orders   POCT HgB A1C (Completed)    Other Visit Diagnoses    Urge incontinence    -  Primary   Will get urine.  Unable to produce one today.  Will bring one back from [place]   Relevant Orders   Urinalysis   GAD (generalized anxiety disorder)       Relevant Medications   ALPRAZolam (XANAX XR) 0.5 MG 24 hr tablet   Diabetic peripheral neuropathy associated with type 2 diabetes mellitus (HCC)       Relevant Medications   ALPRAZolam (XANAX XR) 0.5 MG 24 hr tablet   Insulin Degludec-Liraglutide (XULTOPHY) 100-3.6 UNIT-MG/ML SOPN       Follow up plan: Return in about  3 months (around 08/21/2021).

## 2021-05-21 NOTE — Assessment & Plan Note (Signed)
Hgb A1C is 7.7 today.  She had cut back her insulin due to cost.  She states she can go back up to 50 u now.

## 2021-05-21 NOTE — Patient Instructions (Signed)
Magnesium Glycinate or Threonate

## 2021-05-21 NOTE — Assessment & Plan Note (Signed)
Stable, continue present medications.   

## 2021-05-21 NOTE — Assessment & Plan Note (Signed)
Interested in going to a weight clinic at [facility].  Not interested in diabetes education

## 2021-05-28 ENCOUNTER — Other Ambulatory Visit: Payer: Self-pay | Admitting: Emergency Medicine

## 2021-05-28 ENCOUNTER — Encounter: Payer: Self-pay | Admitting: Unknown Physician Specialty

## 2021-05-28 DIAGNOSIS — N3941 Urge incontinence: Secondary | ICD-10-CM | POA: Diagnosis not present

## 2021-05-28 DIAGNOSIS — E119 Type 2 diabetes mellitus without complications: Secondary | ICD-10-CM | POA: Diagnosis not present

## 2021-05-29 ENCOUNTER — Encounter: Payer: Self-pay | Admitting: Unknown Physician Specialty

## 2021-05-29 LAB — URINALYSIS
Bilirubin Urine: NEGATIVE
Glucose, UA: NEGATIVE
Hgb urine dipstick: NEGATIVE
Ketones, ur: NEGATIVE
Nitrite: NEGATIVE
Protein, ur: NEGATIVE
Specific Gravity, Urine: 1.014 (ref 1.001–1.035)
pH: 7 (ref 5.0–8.0)

## 2021-05-29 LAB — URINE CULTURE
MICRO NUMBER:: 11979919
SPECIMEN QUALITY:: ADEQUATE

## 2021-05-29 LAB — TEST AUTHORIZATION: TEST CODE:: 395

## 2021-06-05 ENCOUNTER — Ambulatory Visit: Payer: BC Managed Care – PPO | Admitting: Family Medicine

## 2021-06-06 ENCOUNTER — Encounter: Payer: Self-pay | Admitting: Unknown Physician Specialty

## 2021-07-02 ENCOUNTER — Encounter: Payer: Self-pay | Admitting: Family Medicine

## 2021-07-10 ENCOUNTER — Telehealth: Payer: Self-pay | Admitting: Family Medicine

## 2021-07-10 NOTE — Telephone Encounter (Signed)
Ulice Dash from [place] [facility] called reporting that the PA for  Insulin Degludec-Liraglutide (XULTOPHY) 100-3.6 UNIT-MG/ML SOPN   was received however a message came back that the patient was not found [phone] Reference key: [facility]

## 2021-07-16 ENCOUNTER — Encounter: Payer: Self-pay | Admitting: Family Medicine

## 2021-07-22 ENCOUNTER — Encounter: Payer: Self-pay | Admitting: Family Medicine

## 2021-07-30 DIAGNOSIS — G8929 Other chronic pain: Secondary | ICD-10-CM | POA: Diagnosis not present

## 2021-07-30 DIAGNOSIS — M25511 Pain in right shoulder: Secondary | ICD-10-CM | POA: Diagnosis not present

## 2021-07-30 DIAGNOSIS — M7551 Bursitis of right shoulder: Secondary | ICD-10-CM | POA: Diagnosis not present

## 2021-07-30 DIAGNOSIS — M7541 Impingement syndrome of right shoulder: Secondary | ICD-10-CM | POA: Diagnosis not present

## 2021-07-30 DIAGNOSIS — M19011 Primary osteoarthritis, right shoulder: Secondary | ICD-10-CM | POA: Diagnosis not present

## 2021-08-15 ENCOUNTER — Encounter: Payer: Self-pay | Admitting: Family Medicine

## 2021-08-20 NOTE — Progress Notes (Signed)
Name: Lindsay Mcdonald   MRN: [id number]    DOB: 18-Oct-1961   Date:08/21/2021       Progress Note  Subjective  Chief Complaint  Follow Up  HPI  Mild Major Depression/insomnia: she is afraid of taking medications, explained again that SSRI is safer than BZD.   She has been on alprazolam XR and is afraid to stop taking medication, we will continue current dose  . Phq 9 is elevated. Denies suicidal thoughts or ideation. She is now living with her daughter due to financial difficulties and sleeping in a sofa   OA: seeing Dr. Candelaria Stagers and had steroid injections on both knees ,no effusion or redness, she has some pain on right popliteal fossa, she has a bakers cysts, knees are stiff intermittently but doing better, also had steroid injections on both shoulder recently and has improved the pain   FMS: she has a long history of feeling aching and tender worse on trunk and upper extremities. Pain on shoulders, arms, stiff on her hips . Feels a little stiff when first gets up, she has some mental fogginess, she has tried Lyrica and Duloxetine but stopped on her own. She is taking Ibuprofen almost daily , advised Tylenol instead    DMII:glucose lately has been elevated due to prednisone  A1C went from 6.6 % to 7.9% to 8.1% ,6.7 %, 7 % 6.6 % , 7.1 % and today is up to 8.1 % again She is back on Xultopahy 50 units also taking Metformin but skips the night dose, new job always has snacks available also recently have steroid injections on shoulders.  Advised to take both Metformin 1500 mg daily .  No polyphagia, polydipsia or polyuria. She states occasional has tingling on her toes .She also has associated HTN, dyslipidemia, neuropathy and obesity.    Snoring: waking up gasping for air, has DM, HTN, and obesity, she  states feeling better since she has been sleeping on the sofa   HTN: she is now on lisiinopril 40 mg but bp is still high we will change to Benicar 40 mg, no chest pain or palpation     Hyperlipidemia: she was on Zetia but also stopped due to  muscle pain, she asked to try Crestor, we will send rx to pharmacy    Morbid Obesity: BMI is still high. She has been snacking on junk food more often, discussed a healthier diet    DDD cervical spine: she went to  [facility]/23/[facility] for evaluation, had c- spine x-ray that showed DDD C5-6, also had a normal shoulder x-ray. Pain was sharp and shooting down left elbow, it was affecting her sleep, she saw Dr. Rudene Christians and he recommend evaluation by neurosurgeon and PT , however saw Dr. Candelaria Stagers since and steroid injections on shoulder improved symptoms   Patient Active Problem List   Diagnosis Date Noted   Type 2 diabetes mellitus ([place]) 05/21/2021   Hypertension associated with diabetes ([place]) 08/31/2020   History of marijuana use 03/30/2019   Fibromyalgia 12/31/2018   Venous vascular malformations 03/27/2016   Non compliance w medication regimen 09/26/2015   Seasonal allergic rhinitis 09/26/2015   Muscle spasms of neck 06/19/2015   Insomnia 06/19/2015   Primary osteoarthritis of right knee 06/17/2015   Depression, major, recurrent, mild ([place]) 06/17/2015   Hyperlipidemia 06/17/2015   Benign hypertension 06/17/2015   Morbid obesity with BMI of 40.0-44.9, adult ([place]) 06/17/2015   Vitamin D deficiency 06/17/2015    Past Surgical History:  Procedure Laterality Date  BREAST REDUCTION SURGERY     ETHMOIDECTOMY Right 05/16/2020   Procedure: ETHMOIDECTOMY;  Surgeon: Margaretha Sheffield, MD;  Location: [facility] ORS;  Service: ENT;  Laterality: Right;   EXCISION ORAL TUMOR N/A 05/16/2020   Procedure: EXCISION OF SOFT PALATE PAPILLOMA;  Surgeon: Margaretha Sheffield, MD;  Location: [facility] OR[facility];  Service: ENT;  Laterality: N/A;   FRONTAL SINUS EXPLORATION Right 05/16/2020   Procedure: FRONTAL SINUS EXPLORATION;  Surgeon: Margaretha Sheffield, MD;  Location: [facility];  Service: ENT;  Laterality: Right;   IMAGE GUIDED SINUS SURGERY N/A 05/16/2020   Procedure: IMAGE GUIDED SINUS  SURGERY;  Surgeon: Margaretha Sheffield, MD;  Location: [facility] OR[facility];  Service: ENT;  Laterality: N/A;   MAXILLARY ANTROSTOMY Bilateral 05/16/2020   Procedure: MAXILLARY ANTROSTOMY with tissue removal;  Surgeon: Margaretha Sheffield, MD;  Location: [facility] OR[facility];  Service: ENT;  Laterality: Bilateral;   nasal endoscopic     OOPHORECTOMY  ?   one ovary removed    REDUCTION MAMMAPLASTY Bilateral 2000   removal of ovary     SPHENOIDECTOMY Right 05/16/2020   Procedure: SPHENOIDECTOMY;  Surgeon: Margaretha Sheffield, MD;  Location: [facility] OR[facility];  Service: ENT;  Laterality: Right;    Family History  Problem Relation Age of Onset   Diabetes Mother    Cancer Mother 62       Breast   Breast cancer Mother 43   Gout Mother    Diabetes Daughter        Oldest Daughter   Stroke Maternal Uncle    Breast cancer Maternal Aunt 23   Diabetes Father    Kidney cancer Maternal Aunt    Kidney disease Maternal Aunt     Social History   Tobacco Use   Smoking status: Never   Smokeless tobacco: Never  Substance Use Topics   Alcohol use: No    Alcohol/week: 0.0 standard drinks     Current Outpatient Medications:    albuterol (VENTOLIN HFA) 108 (90 Base) MCG/ACT inhaler, Inhale 2 puffs into the lungs every 6 (six) hours as needed for wheezing or shortness of breath., Disp: 8 g, Rfl: 2   ALPRAZolam (XANAX XR) 0.5 MG 24 hr tablet, Take 1 tablet (0.5 mg total) by mouth daily., Disp: 30 tablet, Rfl: 2   aspirin EC 81 MG tablet, Take 81 mg by mouth daily. Swallow whole., Disp: , Rfl:    ezetimibe (ZETIA) 10 MG tablet, Take 1 tablet (10 mg total) by mouth daily., Disp: 90 tablet, Rfl: 3   fluticasone (FLONASE) 50 MCG/ACT nasal spray, Place 2 sprays into both nostrils daily., Disp: 16 g, Rfl: 6   Insulin Degludec-Liraglutide (XULTOPHY) 100-3.6 UNIT-MG/ML SOPN, Inject 50 Units into the skin daily., Disp: 45 mL, Rfl: 1   Insulin Pen Needle 32G X 6 MM MISC, 1 each by Does not apply route daily., Disp: 100 each, Rfl: 1   lisinopril (ZESTRIL) 40  MG tablet, Take 1 tablet (40 mg total) by mouth daily., Disp: 90 tablet, Rfl: 1   Melatonin 10 MG TABS, Take 10 mg by mouth at bedtime as needed (sleep). , Disp: , Rfl:    metFORMIN (GLUCOPHAGE-XR) 750 MG 24 hr tablet, Take 2 tablets (1,500 mg total) by mouth daily with breakfast., Disp: 180 tablet, Rfl: 1  Allergies  Allergen Reactions   Simvastatin     Muscle cramps    Atorvastatin Other (See Comments)    muscle cramps   Compazine Other (See Comments)    Twitching and spasms of neck muscles   Penicillins  Rash    Reaction under 25    I personally reviewed active problem list, medication list, allergies, family history, social history, health maintenance with the patient/caregiver today.   ROS  Constitutional: Negative for fever or weight change.  Respiratory: Negative for cough and shortness of breath.   Cardiovascular: Negative for chest pain or palpitations.  Gastrointestinal: Negative for abdominal pain, no bowel changes.  Musculoskeletal: Negative for gait problem or joint swelling.  Skin: Negative for rash.  Neurological: Negative for dizziness or headache.  No other specific complaints in a complete review of systems (except as listed in HPI above).   Objective  Vitals:   08/21/21 0921  BP: 140/80  Pulse: 94  Resp: 16  Temp: 98.7 F (37.1 C)  SpO2: 98%  Weight: 273 lb (123.8 kg)  Height: '5\' 4"'$  (1.626 m)    Body mass index is 46.86 kg/m.  Physical Exam  Constitutional: Patient appears well-developed and well-nourished. Obese  No distress.  HEENT: head atraumatic, normocephalic, pupils equal and reactive to light, neck supple Cardiovascular: Normal rate, regular rhythm and normal heart sounds.  No murmur heard. No BLE edema. Pulmonary/Chest: Effort normal and breath sounds normal. No respiratory distress. Abdominal: Soft.  There is no tenderness. Muscular Skeletal: crepitus with extension of both knees  Psychiatric: Patient has a normal mood and affect.  behavior is normal. Judgment and thought content normal.   Recent Results (from the past 2160 hour(s))  Urinalysis     Status: Abnormal   Collection Time: 05/28/21 10:08 AM  Result Value Ref Range   Color, Urine YELLOW YELLOW   APPearance CLEAR CLEAR   Specific Gravity, Urine 1.014 1.001 - 1.035   pH 7.0 5.0 - 8.0   Glucose, UA NEGATIVE NEGATIVE   Bilirubin Urine NEGATIVE NEGATIVE   Ketones, ur NEGATIVE NEGATIVE   Hgb urine dipstick NEGATIVE NEGATIVE   Protein, ur NEGATIVE NEGATIVE   Nitrite NEGATIVE NEGATIVE   Leukocytes,Ua TRACE (A) NEGATIVE  TEST AUTHORIZATION     Status: None   Collection Time: 05/28/21 10:08 AM  Result Value Ref Range   TEST NAME: URINE CULT    TEST CODE: 395    CLIENT CONTACT: HELEN, CMA    REPORT ALWAYS MESSAGE SIGNATURE      Comment: . The laboratory testing on this patient was verbally requested or confirmed by the ordering physician or his or her authorized representative after contact with an employee of [employer]. Federal regulations require that we maintain on file written authorization for all laboratory testing.  Accordingly we are asking that the ordering physician or his or her authorized representative sign a copy of this report and promptly return it to the client service representative. . . Signature:____________________________________________________ .   Urine Culture     Status: None   Collection Time: 05/28/21 10:08 AM  Result Value Ref Range   MICRO NUMBER: [id number]    SPECIMEN QUALITY: Adequate    Sample Source URINE    STATUS: FINAL    ISOLATE 1:      Mixed genital flora isolated. These superficial bacteria are not indicative of a urinary tract infection. No further organism identification is warranted on this specimen. If clinically indicated, recollect clean-catch, mid-stream urine and transfer  immediately to Urine Culture Transport Tube.      PHQ2/9: Depression screen [facility] 2/9 08/21/2021 05/21/2021 02/22/2021  11/13/2020 08/31/2020  Decreased Interest '2 1 1 2 1  '$ Down, Depressed, Hopeless '1 1 1 1 1  '$ PHQ - 2 Score  $'3 2 2 3 2  'U$ Altered sleeping 0 0 0 1 0  Tired, decreased energy '1 1 1 2 1  '$ Change in appetite 0 0 '1 2 3  '$ Feeling bad or failure about yourself  0 0 '1 1 1  '$ Trouble concentrating 0 0 0 0 0  Moving slowly or fidgety/restless 0 0 0 1 0  Suicidal thoughts 0 0 0 0 0  PHQ-9 Score '4 3 5 10 7  '$ Difficult doing work/chores - Not difficult at all - - Somewhat difficult  Some recent data might be hidden    phq 9 is positive   Fall Risk: Fall Risk  08/21/2021 05/21/2021 02/22/2021 11/13/2020 08/31/2020  Falls in the past year? 0 0 0 0 0  Number falls in past yr: 0 0 0 0 0  Injury with Fall? 0 0 0 0 0  Risk for fall due to : No Fall Risks - - - -  Follow up Falls prevention discussed - - - -      Functional Status Survey: Is the patient deaf or have difficulty hearing?: No Does the patient have difficulty seeing, even when wearing glasses/contacts?: Yes Does the patient have difficulty concentrating, remembering, or making decisions?: Yes Does the patient have difficulty walking or climbing stairs?: No Does the patient have difficulty dressing or bathing?: No Does the patient have difficulty doing errands alone such as visiting a doctor's office or shopping?: No    Assessment & Plan  1. Diabetes mellitus type 2 in obese (HCC)  - POCT HgB A1C - pioglitazone (ACTOS) 15 MG tablet; Take 1 tablet (15 mg total) by mouth daily.  Dispense: 90 tablet; Refill: 1  2. Vaginal odor  - Cervicovaginal ancillary only - POCT Urinalysis Dipstick  3. GAD (generalized anxiety disorder)  - ALPRAZolam (XANAX XR) 0.5 MG 24 hr tablet; Take 1 tablet (0.5 mg total) by mouth daily.  Dispense: 30 tablet; Refill: 2  4. Diabetic peripheral neuropathy associated with type 2 diabetes mellitus (HCC)  - metFORMIN (GLUCOPHAGE-XR) 750 MG 24 hr tablet; Take 2 tablets (1,500 mg total) by mouth daily with breakfast.   Dispense: 180 tablet; Refill: 1  5. Need for shingles vaccine  - Varicella-zoster vaccine IM  6. Depression, major, recurrent, mild ([place])   7. Morbid obesity with BMI of 40.0-44.9, adult [facility])  Discussed with the patient the risk posed by an increased BMI. Discussed importance of portion control, calorie counting and at least 150 minutes of physical activity weekly. Avoid sweet beverages and drink more water. Eat at least 6 servings of fruit and vegetables daily    8. Primary insomnia  Taking melatonin   9. Fibromyalgia   10. Hypertension associated with diabetes ([place])  - olmesartan (BENICAR) 40 MG tablet; Take 1 tablet (40 mg total) by mouth daily. In place of lisinopril for bp  Dispense: 90 tablet; Refill: 1  11. Bilateral primary osteoarthritis of knee   12. Breast cancer screening by mammogram  - MM 3D SCREEN BREAST BILATERAL; Future   13. Snoring  ESS of 9 - she wants to hold off on sleep study for now

## 2021-08-21 ENCOUNTER — Telehealth: Payer: Self-pay

## 2021-08-21 ENCOUNTER — Other Ambulatory Visit (HOSPITAL_COMMUNITY)
Admission: RE | Admit: 2021-08-21 | Discharge: 2021-08-21 | Disposition: A | Discharge status: Home / Self Care | Payer: BC Managed Care – PPO | Source: Ambulatory Visit | Attending: Family Medicine | Admitting: Family Medicine

## 2021-08-21 ENCOUNTER — Ambulatory Visit (INDEPENDENT_AMBULATORY_CARE_PROVIDER_SITE_OTHER): Payer: BC Managed Care – PPO | Admitting: Family Medicine

## 2021-08-21 ENCOUNTER — Encounter: Payer: Self-pay | Admitting: Family Medicine

## 2021-08-21 ENCOUNTER — Other Ambulatory Visit: Payer: Self-pay | Admitting: Family Medicine

## 2021-08-21 ENCOUNTER — Other Ambulatory Visit: Payer: Self-pay

## 2021-08-21 VITALS — BP 140/80 | HR 94 | Temp 98.7°F | Resp 16 | Ht 64.0 in | Wt 273.0 lb

## 2021-08-21 DIAGNOSIS — E1169 Type 2 diabetes mellitus with other specified complication: Secondary | ICD-10-CM | POA: Diagnosis not present

## 2021-08-21 DIAGNOSIS — N898 Other specified noninflammatory disorders of vagina: Secondary | ICD-10-CM | POA: Insufficient documentation

## 2021-08-21 DIAGNOSIS — E119 Type 2 diabetes mellitus without complications: Secondary | ICD-10-CM

## 2021-08-21 DIAGNOSIS — F5101 Primary insomnia: Secondary | ICD-10-CM

## 2021-08-21 DIAGNOSIS — M170 Bilateral primary osteoarthritis of knee: Secondary | ICD-10-CM

## 2021-08-21 DIAGNOSIS — M797 Fibromyalgia: Secondary | ICD-10-CM

## 2021-08-21 DIAGNOSIS — E1142 Type 2 diabetes mellitus with diabetic polyneuropathy: Secondary | ICD-10-CM

## 2021-08-21 DIAGNOSIS — R0683 Snoring: Secondary | ICD-10-CM

## 2021-08-21 DIAGNOSIS — Z1231 Encounter for screening mammogram for malignant neoplasm of breast: Secondary | ICD-10-CM

## 2021-08-21 DIAGNOSIS — E669 Obesity, unspecified: Secondary | ICD-10-CM | POA: Diagnosis not present

## 2021-08-21 DIAGNOSIS — Z23 Encounter for immunization: Secondary | ICD-10-CM

## 2021-08-21 DIAGNOSIS — F411 Generalized anxiety disorder: Secondary | ICD-10-CM

## 2021-08-21 DIAGNOSIS — E1159 Type 2 diabetes mellitus with other circulatory complications: Secondary | ICD-10-CM

## 2021-08-21 DIAGNOSIS — F330 Major depressive disorder, recurrent, mild: Secondary | ICD-10-CM

## 2021-08-21 DIAGNOSIS — I152 Hypertension secondary to endocrine disorders: Secondary | ICD-10-CM

## 2021-08-21 DIAGNOSIS — Z6841 Body Mass Index (BMI) 40.0 and over, adult: Secondary | ICD-10-CM

## 2021-08-21 LAB — POCT URINALYSIS DIPSTICK
Bilirubin, UA: NEGATIVE
Blood, UA: NEGATIVE
Glucose, UA: NEGATIVE
Ketones, UA: NEGATIVE
Leukocytes, UA: NEGATIVE
Nitrite, UA: NEGATIVE
Protein, UA: NEGATIVE
Spec Grav, UA: >=1.03 — AB (ref 1.010–1.025)
Urobilinogen, UA: 0.2 E.U./dL
pH, UA: 6 (ref 5.0–8.0)

## 2021-08-21 LAB — POCT GLYCOSYLATED HEMOGLOBIN (HGB A1C): Hemoglobin A1C: 8.1 % — AB (ref 4.0–5.6)

## 2021-08-21 MED ORDER — ROSUVASTATIN CALCIUM 10 MG PO TABS: 5.0000 mg | ORAL_TABLET | Freq: Every day | ORAL | 0 refills | Status: DC

## 2021-08-21 MED ORDER — OLMESARTAN MEDOXOMIL 40 MG PO TABS: 40.0000 mg | ORAL_TABLET | Freq: Every day | ORAL | 1 refills | Status: DC

## 2021-08-21 MED ORDER — PIOGLITAZONE HCL 15 MG PO TABS: 15.0000 mg | ORAL_TABLET | Freq: Every day | ORAL | 1 refills | Status: DC

## 2021-08-21 MED ORDER — METFORMIN HCL ER 750 MG PO TB24: 1500.0000 mg | ORAL_TABLET | Freq: Every day | ORAL | 1 refills | Status: DC

## 2021-08-21 MED ORDER — ALPRAZOLAM ER 0.5 MG PO TB24
0.5000 mg | ORAL_TABLET | Freq: Every day | ORAL | 2 refills | Status: DC
Start: 2021-08-21 — End: 2021-11-18

## 2021-08-21 NOTE — Telephone Encounter (Signed)
Copied from [place] [id number]. Topic: General - Inquiry >> Aug 21, 2021 10:29 AM Loma Boston wrote: Reason for CRM: baclofen (LIORESAL) 10 MG tablet [id number] DISCONTINUED  Order Details Dose: 10 mg Route: Oral Pt just just had her appt with Dr Ancil Boozer  today and has called back as she failed to mention in appt that she has still been taking above script. She takes as needed for eye twitching. She was wanting to know if it would be ok to reintroduce that med for occasional use when needed. FU with pt @  [phone]

## 2021-08-22 ENCOUNTER — Other Ambulatory Visit: Payer: Self-pay

## 2021-08-22 DIAGNOSIS — M62838 Other muscle spasm: Secondary | ICD-10-CM

## 2021-08-22 LAB — CERVICOVAGINAL ANCILLARY ONLY
Bacterial Vaginitis (gardnerella): NEGATIVE
Candida Glabrata: NEGATIVE
Candida Vaginitis: NEGATIVE
Chlamydia: NEGATIVE
Comment: NEGATIVE
Comment: NEGATIVE
Comment: NEGATIVE
Comment: NEGATIVE
Comment: NEGATIVE
Comment: NORMAL
Neisseria Gonorrhea: NEGATIVE
Trichomonas: NEGATIVE

## 2021-08-22 MED ORDER — BACLOFEN 10 MG PO TABS: 10.0000 mg | ORAL_TABLET | Freq: Two times a day (BID) | ORAL | 0 refills | Status: DC | PRN

## 2021-08-23 ENCOUNTER — Other Ambulatory Visit: Payer: Self-pay | Admitting: Family Medicine

## 2021-08-23 ENCOUNTER — Encounter: Payer: Self-pay | Admitting: Family Medicine

## 2021-08-23 DIAGNOSIS — R350 Frequency of micturition: Secondary | ICD-10-CM

## 2021-08-27 DIAGNOSIS — R350 Frequency of micturition: Secondary | ICD-10-CM | POA: Diagnosis not present

## 2021-08-29 LAB — CULTURE, URINE COMPREHENSIVE
MICRO NUMBER:: 12336273
RESULT:: NO GROWTH
SPECIMEN QUALITY:: ADEQUATE

## 2021-08-30 ENCOUNTER — Encounter: Payer: Self-pay | Admitting: Family Medicine

## 2021-09-03 ENCOUNTER — Other Ambulatory Visit: Payer: Self-pay

## 2021-09-03 ENCOUNTER — Encounter: Payer: Self-pay | Admitting: Family Medicine

## 2021-09-03 ENCOUNTER — Telehealth: Payer: Self-pay

## 2021-09-03 DIAGNOSIS — F5101 Primary insomnia: Secondary | ICD-10-CM

## 2021-09-03 DIAGNOSIS — R0683 Snoring: Secondary | ICD-10-CM

## 2021-09-04 ENCOUNTER — Encounter: Payer: Self-pay | Admitting: Family Medicine

## 2021-09-06 NOTE — Telephone Encounter (Signed)
Sleep study order form faxed.

## 2021-09-10 ENCOUNTER — Encounter: Payer: Self-pay | Admitting: Family Medicine

## 2021-09-11 ENCOUNTER — Other Ambulatory Visit: Payer: Self-pay

## 2021-09-11 ENCOUNTER — Ambulatory Visit (INDEPENDENT_AMBULATORY_CARE_PROVIDER_SITE_OTHER): Payer: BC Managed Care – PPO

## 2021-09-11 DIAGNOSIS — Z23 Encounter for immunization: Secondary | ICD-10-CM

## 2021-09-13 ENCOUNTER — Encounter: Payer: Self-pay | Admitting: Family Medicine

## 2021-09-17 ENCOUNTER — Ambulatory Visit (INDEPENDENT_AMBULATORY_CARE_PROVIDER_SITE_OTHER): Payer: BC Managed Care – PPO | Admitting: Nurse Practitioner

## 2021-09-17 ENCOUNTER — Encounter: Payer: Self-pay | Admitting: Family Medicine

## 2021-09-17 ENCOUNTER — Other Ambulatory Visit: Payer: Self-pay

## 2021-09-17 ENCOUNTER — Encounter: Payer: Self-pay | Admitting: Nurse Practitioner

## 2021-09-17 VITALS — BP 128/72 | HR 98 | Temp 98.1°F | Resp 16 | Ht 64.0 in | Wt 273.0 lb

## 2021-09-17 DIAGNOSIS — N3946 Mixed incontinence: Secondary | ICD-10-CM | POA: Diagnosis not present

## 2021-09-17 DIAGNOSIS — R350 Frequency of micturition: Secondary | ICD-10-CM

## 2021-09-17 DIAGNOSIS — R0683 Snoring: Secondary | ICD-10-CM

## 2021-09-17 LAB — POCT URINALYSIS DIPSTICK
Bilirubin, UA: NEGATIVE
Glucose, UA: NEGATIVE
Ketones, UA: 80
Nitrite, UA: NEGATIVE
Protein, UA: POSITIVE — AB
Spec Grav, UA: 1.02 (ref 1.010–1.025)
Urobilinogen, UA: 0.2 E.U./dL
pH, UA: 5 (ref 5.0–8.0)

## 2021-09-17 MED ORDER — CIPROFLOXACIN HCL 250 MG PO TABS: 250.0000 mg | ORAL_TABLET | Freq: Two times a day (BID) | ORAL | 0 refills | Status: AC

## 2021-09-17 NOTE — Progress Notes (Signed)
BP 128/72   Pulse 98   Temp 98.1 F (36.7 C) (Oral)   Resp 16   Ht 5\' 4"  (1.626 m)   Wt 273 lb (123.8 kg)   SpO2 96%   BMI 46.86 kg/m    Subjective:    Patient ID: Lindsay Mcdonald, female    DOB: Jun 05, 1961, 60 y.o.   MRN: [id number]  HPI: Lindsay Mcdonald is a 60 y.o. female, here alone  Chief Complaint  Patient presents with   Urinary Frequency    With pain on left side   Urinary frequency/urgency:  She says yesterday she noticed that she had urinary frequency and urgency.  She says she also noticed an odor.  She denies any vaginal discharge or bleeding.  She is not sexually active.  She denies fever, dysuria or flank pain.    Urinary incontinence: She has had multiple episodes over the last few months. She says she wakes up in the middle of the night, runs to the bathroom but urine is running down her leg.  She says she has two daughters, vaginal deliveries.  Will refer to urology.  Right hip pain:  She says she has an achy pain in her right hip when she gets up to walk. Pain is a 4/10. She says the pain comes and goes and she has seen ortho for it.  She takes tylenol arthritis for the pain.  Discussed adding valteran gel.    Snoring:  She says she snores every night.  She says she is working on getting her sleep study done.  She is waiting to find out how much her cost will be.   Relevant past medical, surgical, family and social history reviewed and updated as indicated. Interim medical history since our last visit reviewed. Allergies and medications reviewed and updated.  Review of Systems  Constitutional: Negative for fever or weight change.  Respiratory: Negative for cough and shortness of breath.   Cardiovascular: Negative for chest pain or palpitations.  Gastrointestinal: Negative for abdominal pain, no bowel changes.  Genitourinary: positive for urgency, frequency and incontinence, negative for vaginal bleeding or discharge Musculoskeletal: Negative for gait  problem or joint swelling. Positive for right hip pain Skin: Negative for rash.  Neurological: Negative for dizziness or headache.  No other specific complaints in a complete review of systems (except as listed in HPI above).      Objective:    BP 128/72   Pulse 98   Temp 98.1 F (36.7 C) (Oral)   Resp 16   Ht 5\' 4"  (1.626 m)   Wt 273 lb (123.8 kg)   SpO2 96%   BMI 46.86 kg/m   Wt Readings from Last 3 Encounters:  09/17/21 273 lb (123.8 kg)  08/21/21 273 lb (123.8 kg)  05/21/21 273 lb 11.2 oz (124.1 kg)    Physical Exam  Constitutional: Patient appears well-developed and well-nourished. Obese No distress.  HEENT: head atraumatic, normocephalic, pupils equal and reactive to light, neck supple Cardiovascular: Normal rate, regular rhythm and normal heart sounds.  No murmur heard. No BLE edema. Pulmonary/Chest: Effort normal and breath sounds normal. No respiratory distress. Abdominal: Soft.  There is no tenderness. No CVA tenderness Musculoskeletal: No decrease ROM Psychiatric: Patient has a normal mood and affect. behavior is normal. Judgment and thought content normal.   Results for orders placed or performed in visit on 09/17/21  POCT urinalysis dipstick  Result Value Ref Range   Color, UA gold    Clarity, UA cloudy  Glucose, UA Negative Negative   Bilirubin, UA negative    Ketones, UA 80    Spec Grav, UA 1.020 1.010 - 1.025   Blood, UA small    pH, UA 5.0 5.0 - 8.0   Protein, UA Positive (A) Negative   Urobilinogen, UA 0.2 0.2 or 1.0 E.U./dL   Nitrite, UA negative    Leukocytes, UA Moderate (2+) (A) Negative   Appearance cloudy    Odor none       Assessment & Plan:   1. Urinary frequency  - POCT urinalysis dipstick - ciprofloxacin (CIPRO) 250 MG tablet; Take 1 tablet (250 mg total) by mouth 2 (two) times daily for 3 days.  Dispense: 6 tablet; Refill: 0 - Urine Culture  2. Mixed stress and urge urinary incontinence  - Ambulatory referral to Urology -  Urine Culture  3. Snoring - awaiting to get sleep study  Follow up plan: Return if symptoms worsen or fail to improve.

## 2021-09-18 ENCOUNTER — Ambulatory Visit: Payer: BC Managed Care – PPO | Admitting: Family Medicine

## 2021-09-18 LAB — URINE CULTURE
MICRO NUMBER:: 12428796
SPECIMEN QUALITY:: ADEQUATE

## 2021-09-19 ENCOUNTER — Ambulatory Visit: Payer: BC Managed Care – PPO | Admitting: Family Medicine

## 2021-09-19 ENCOUNTER — Encounter: Payer: Self-pay | Admitting: Nurse Practitioner

## 2021-09-25 ENCOUNTER — Ambulatory Visit: Payer: BC Managed Care – PPO | Admitting: Family Medicine

## 2021-09-27 ENCOUNTER — Other Ambulatory Visit: Payer: Self-pay

## 2021-09-27 ENCOUNTER — Other Ambulatory Visit
Admission: RE | Admit: 2021-09-27 | Discharge: 2021-09-27 | Disposition: A | Discharge status: Home / Self Care | Payer: BC Managed Care – PPO | Source: Ambulatory Visit | Attending: Family Medicine | Admitting: Family Medicine

## 2021-09-27 DIAGNOSIS — Z01812 Encounter for preprocedural laboratory examination: Secondary | ICD-10-CM | POA: Diagnosis not present

## 2021-09-27 DIAGNOSIS — Z20822 Contact with and (suspected) exposure to covid-19: Secondary | ICD-10-CM | POA: Diagnosis not present

## 2021-09-28 LAB — SARS CORONAVIRUS 2 (TAT 6-24 HRS): SARS Coronavirus 2: NEGATIVE

## 2021-10-01 ENCOUNTER — Ambulatory Visit: Payer: BC Managed Care – PPO | Attending: Neurology

## 2021-10-01 DIAGNOSIS — Z6841 Body Mass Index (BMI) 40.0 and over, adult: Secondary | ICD-10-CM | POA: Diagnosis not present

## 2021-10-01 DIAGNOSIS — F5101 Primary insomnia: Secondary | ICD-10-CM | POA: Diagnosis not present

## 2021-10-01 DIAGNOSIS — G4733 Obstructive sleep apnea (adult) (pediatric): Secondary | ICD-10-CM | POA: Insufficient documentation

## 2021-10-01 DIAGNOSIS — R0683 Snoring: Secondary | ICD-10-CM | POA: Diagnosis not present

## 2021-10-02 ENCOUNTER — Other Ambulatory Visit: Payer: BC Managed Care – PPO

## 2021-10-02 ENCOUNTER — Other Ambulatory Visit: Payer: Self-pay

## 2021-10-07 ENCOUNTER — Encounter: Payer: Self-pay | Admitting: Family Medicine

## 2021-10-09 ENCOUNTER — Ambulatory Visit: Payer: BC Managed Care – PPO | Admitting: Urology

## 2021-10-10 ENCOUNTER — Encounter: Payer: Self-pay | Admitting: Family Medicine

## 2021-10-14 ENCOUNTER — Ambulatory Visit (INDEPENDENT_AMBULATORY_CARE_PROVIDER_SITE_OTHER): Payer: BC Managed Care – PPO | Admitting: Urology

## 2021-10-14 ENCOUNTER — Other Ambulatory Visit: Payer: Self-pay

## 2021-10-14 ENCOUNTER — Encounter: Payer: Self-pay | Admitting: Urology

## 2021-10-14 VITALS — BP 153/82 | HR 92 | Ht 64.0 in | Wt 275.0 lb

## 2021-10-14 DIAGNOSIS — N3946 Mixed incontinence: Secondary | ICD-10-CM | POA: Diagnosis not present

## 2021-10-14 DIAGNOSIS — R351 Nocturia: Secondary | ICD-10-CM | POA: Diagnosis not present

## 2021-10-14 LAB — URINALYSIS, COMPLETE
Bilirubin, UA: NEGATIVE
Ketones, UA: NEGATIVE
Nitrite, UA: NEGATIVE
Protein,UA: NEGATIVE
RBC, UA: NEGATIVE
Specific Gravity, UA: >1.03 — ABNORMAL HIGH (ref 1.005–1.030)
Urobilinogen, Ur: 0.2 mg/dL (ref 0.2–1.0)
pH, UA: 5.5 (ref 5.0–7.5)

## 2021-10-14 LAB — BLADDER SCAN AMB NON-IMAGING

## 2021-10-14 LAB — MICROSCOPIC EXAMINATION

## 2021-10-14 NOTE — Patient Instructions (Addendum)
Sleep Apnea Sleep apnea is a condition in which breathing pauses or becomes shallow during sleep. People with sleep apnea usually snore loudly. They may have times when they gasp and stop breathing for 10 seconds or more during sleep. This may happen many times during the night. Sleep apnea disrupts your sleep and keeps your body from getting the rest that it needs. This condition can increase your risk of certain health problems, including: Heart attack. Stroke. Obesity. Type 2 diabetes. Heart failure. Irregular heartbeat. High blood pressure. The goal of treatment is to help you breathe normally again. What are the causes? The most common cause of sleep apnea is a collapsed or blocked airway. There are three kinds of sleep apnea: Obstructive sleep apnea. This kind is caused by a blocked or collapsed airway. Central sleep apnea. This kind happens when the part of the brain that controls breathing does not send the correct signals to the muscles that control breathing. Mixed sleep apnea. This is a combination of obstructive and central sleep apnea. What increases the risk? You are more likely to develop this condition if you: Are overweight. Smoke. Have a smaller than normal airway. Are older. Are female. Drink alcohol. Take sedatives or tranquilizers. Have a family history of sleep apnea. Have a tongue or tonsils that are larger than normal. What are the signs or symptoms? Symptoms of this condition include: Trouble staying asleep. Loud snoring. Morning headaches. Waking up gasping. Dry mouth or sore throat in the morning. Daytime sleepiness and tiredness. If you have daytime fatigue because of sleep apnea, you may be more likely to have: Trouble concentrating. Forgetfulness. Irritability or mood swings. Personality changes. Feelings of depression. Sexual dysfunction. This may include loss of interest if you are female, or erectile dysfunction if you are female. How is this  diagnosed? This condition may be diagnosed with: A medical history. A physical exam. A series of tests that are done while you are sleeping (sleep study). These tests are usually done in a sleep lab, but they may also be done at home. How is this treated? Treatment for this condition aims to restore normal breathing and to ease symptoms during sleep. It may involve managing health issues that can affect breathing, such as high blood pressure or obesity. Treatment may include: Sleeping on your side. Using a decongestant if you have nasal congestion. Avoiding the use of depressants, including alcohol, sedatives, and narcotics. Losing weight if you are overweight. Making changes to your diet. Quitting smoking. Using a device to open your airway while you sleep, such as: An oral appliance. This is a custom-made mouthpiece that shifts your lower jaw forward. A continuous positive airway pressure (CPAP) device. This device blows air through a mask when you breathe out (exhale). A nasal expiratory positive airway pressure (EPAP) device. This device has valves that you put into each nostril. A bi-level positive airway pressure (BPAP) device. This device blows air through a mask when you breathe in (inhale) and breathe out (exhale). Having surgery if other treatments do not work. During surgery, excess tissue is removed to create a wider airway. Follow these instructions at home: Lifestyle Make any lifestyle changes that your health care provider recommends. Eat a healthy, well-balanced diet. Take steps to lose weight if you are overweight. Avoid using depressants, including alcohol, sedatives, and narcotics. Do not use any products that contain nicotine or tobacco. These products include cigarettes, chewing tobacco, and vaping devices, such as e-cigarettes. If you need help quitting, ask your  health care provider. General instructions Take over-the-counter and prescription medicines only as told  by your health care provider. If you were given a device to open your airway while you sleep, use it only as told by your health care provider. If you are having surgery, make sure to tell your health care provider you have sleep apnea. You may need to bring your device with you. Keep all follow-up visits. This is important. Contact a health care provider if: The device that you received to open your airway during sleep is uncomfortable or does not seem to be working. Your symptoms do not improve. Your symptoms get worse. Get help right away if: You develop: Chest pain. Shortness of breath. Discomfort in your back, arms, or stomach. You have: Trouble speaking. Weakness on one side of your body. Drooping in your face. These symptoms may represent a serious problem that is an emergency. Do not wait to see if the symptoms will go away. Get medical help right away. Call your local emergency services (911 in the [place] Do not drive yourself to the hospital. Summary Sleep apnea is a condition in which breathing pauses or becomes shallow during sleep. The most common cause is a collapsed or blocked airway. The goal of treatment is to restore normal breathing and to ease symptoms during sleep. This information is not intended to replace advice given to you by your health care provider. Make sure you discuss any questions you have with your health care provider. Document Revised: 11/16/2020 Document Reviewed: 11/16/2020 Elsevier Patient Education  2022 [place].   Overactive Bladder, Adult Overactive bladder is a condition in which a person has a sudden and frequent need to urinate. A person might also leak urine if he or she cannot get to the bathroom fast enough (urinary incontinence). Sometimes, symptoms can interfere with work or social activities. What are the causes? Overactive bladder is associated with poor nerve signals between your bladder and your brain. Your bladder may get the  signal to empty before it is full. You may also have very sensitive muscles that make your bladder squeeze too soon. This condition may also be caused by other factors, such as: Medical conditions: Urinary tract infection. Infection of nearby tissues. Prostate enlargement. Bladder stones, inflammation, or tumors. Diabetes. Muscle or nerve weakness, especially from these conditions: A spinal cord injury. Stroke. Multiple sclerosis. Parkinson's disease. Other causes: Surgery on the uterus or urethra. Drinking too much caffeine or alcohol. Certain medicines, especially those that eliminate extra fluid in the body (diuretics). Constipation. What increases the risk? You may be at greater risk for overactive bladder if you: Are an older adult. Smoke. Are going through menopause. Have prostate problems. Have a neurological disease, such as stroke, dementia, Parkinson's disease, or multiple sclerosis (MS). Eat or drink alcohol, spicy food, caffeine, and other things that irritate the bladder. Are overweight or obese. What are the signs or symptoms? Symptoms of this condition include a sudden, strong urge to urinate. Other symptoms include: Leaking urine. Urinating 8 or more times a day. Waking up to urinate 2 or more times overnight. How is this diagnosed? This condition may be diagnosed based on: Your symptoms and medical history. A physical exam. Blood or urine tests to check for possible causes, such as infection. You may also need to see a health care provider who specializes in urinary tract problems. This is called a urologist. How is this treated? Treatment for overactive bladder depends on the cause of your condition  and whether it is mild or severe. Treatment may include: Bladder training, such as: Learning to control the urge to urinate by following a schedule to urinate at regular intervals. Doing Kegel exercises to strengthen the pelvic floor muscles that support your  bladder. Special devices, such as: Biofeedback. This uses sensors to help you become aware of your body's signals. Electrical stimulation. This uses electrodes placed inside the body (implanted) or outside the body. These electrodes send gentle pulses of electricity to strengthen the nerves or muscles that control the bladder. Women may use a plastic device, called a pessary, that fits into the vagina and supports the bladder. Medicines, such as: Antibiotics to treat bladder infection. Antispasmodics to stop the bladder from releasing urine at the wrong time. Tricyclic antidepressants to relax bladder muscles. Injections of botulinum toxin type A directly into the bladder tissue to relax bladder muscles. Surgery, such as: A device may be implanted to help manage the nerve signals that control urination. An electrode may be implanted to stimulate electrical signals in the bladder. A procedure may be done to change the shape of the bladder. This is done only in very severe cases. Follow these instructions at home: Eating and drinking  Make diet or lifestyle changes recommended by your health care provider. These may include: Drinking fluids throughout the day and not only with meals. Cutting down on caffeine or alcohol. Eating a healthy and balanced diet to prevent constipation. This may include: Choosing foods that are high in fiber, such as beans, whole grains, and fresh fruits and vegetables. Limiting foods that are high in fat and processed sugars, such as fried and sweet foods. Lifestyle  Lose weight if needed. Do not use any products that contain nicotine or tobacco. These include cigarettes, chewing tobacco, and vaping devices, such as e-cigarettes. If you need help quitting, ask your health care provider. General instructions Take over-the-counter and prescription medicines only as told by your health care provider. If you were prescribed an antibiotic medicine, take it as told by  your health care provider. Do not stop taking the antibiotic even if you start to feel better. Use any implants or pessary as told by your health care provider. If needed, wear pads to absorb urine leakage. Keep a log to track how much and when you drink, and when you need to urinate. This will help your health care provider monitor your condition. Keep all follow-up visits. This is important. Contact a health care provider if: You have a fever or chills. Your symptoms do not get better with treatment. Your pain and discomfort get worse. You have more frequent urges to urinate. Get help right away if: You are not able to control your bladder. Summary Overactive bladder refers to a condition in which a person has a sudden and frequent need to urinate. Several conditions may lead to an overactive bladder. Treatment for overactive bladder depends on the cause and severity of your condition. Making lifestyle changes, doing Kegel exercises, keeping a log, and taking medicines can help with this condition. This information is not intended to replace advice given to you by your health care provider. Make sure you discuss any questions you have with your health care provider. Document Revised: 08/27/2020 Document Reviewed: 08/27/2020 Elsevier Patient Education  [place].

## 2021-10-14 NOTE — Progress Notes (Signed)
10/14/21 12:48 PM   Christain [place] [birth year] [id number]  CC: Nocturia  HPI: 60 year old female with morbid obesity and BMI of 47, diabetes, new diagnosis of sleep apnea not yet on CPAP who reports a few months of worsening urinary symptoms overnight.  She has nocturia at least 2-3 times overnight, and sometimes will have leakage before she makes it to the bathroom.  She denies any urinary symptoms during the day.  She denies any stress incontinence or urgency during the day.  She drinks water and diet ginger ale during the day.  She denies any gross hematuria.  Recent urine culture x2 were negative.  Urinalysis today completely benign, PVR normal at 3 mL.  PMH: Past Medical History:  Diagnosis Date   Anxiety    Arthritis 03/2020   osteoarthritis both knees   Chronic pain of right knee    Chronic sinusitis    DDD (degenerative disc disease), cervical    cervical radiculopathy   Depression    Diabetes mellitus    insulin dependant   Fibromyalgia    GERD (gastroesophageal reflux disease)    High cholesterol    Hyperlipidemia    Hypertension    Insomnia    Muscle pain    Nasal polyp    Neuromuscular disorder ([place])    diabetic neuropathy   Obesity    Palpitations    Tendonitis of knee, left 04/2020   received steroid injection   Vitamin D deficiency    Vitamin D deficiency     Surgical History: Past Surgical History:  Procedure Laterality Date   BREAST REDUCTION SURGERY     ETHMOIDECTOMY Right 05/16/2020   Procedure: ETHMOIDECTOMY;  Surgeon: Margaretha Sheffield, MD;  Location: [facility];  Service: ENT;  Laterality: Right;   EXCISION ORAL TUMOR N/A 05/16/2020   Procedure: EXCISION OF SOFT PALATE PAPILLOMA;  Surgeon: Margaretha Sheffield, MD;  Location: [facility];  Service: ENT;  Laterality: N/A;   FRONTAL SINUS EXPLORATION Right 05/16/2020   Procedure: FRONTAL SINUS EXPLORATION;  Surgeon: Margaretha Sheffield, MD;  Location: [facility];  Service: ENT;  Laterality: Right;   IMAGE GUIDED  SINUS SURGERY N/A 05/16/2020   Procedure: IMAGE GUIDED SINUS SURGERY;  Surgeon: Margaretha Sheffield, MD;  Location: [facility];  Service: ENT;  Laterality: N/A;   MAXILLARY ANTROSTOMY Bilateral 05/16/2020   Procedure: MAXILLARY ANTROSTOMY with tissue removal;  Surgeon: Margaretha Sheffield, MD;  Location: [facility];  Service: ENT;  Laterality: Bilateral;   nasal endoscopic     OOPHORECTOMY  ?   one ovary removed    REDUCTION MAMMAPLASTY Bilateral 2000   removal of ovary     SPHENOIDECTOMY Right 05/16/2020   Procedure: SPHENOIDECTOMY;  Surgeon: Margaretha Sheffield, MD;  Location: [facility];  Service: ENT;  Laterality: Right;      Family History: Family History  Problem Relation Age of Onset   Diabetes Mother    Cancer Mother 80       Breast   Breast cancer Mother 75   Gout Mother    Diabetes Father    Diabetes Daughter        Oldest Daughter   Breast cancer Maternal Aunt 55   Kidney cancer Maternal Aunt    Kidney disease Maternal Aunt    Stroke Maternal Uncle    Prostate cancer Neg Hx    Bladder Cancer Neg Hx     Social History:  reports that she has never smoked. She has never used smokeless tobacco. She reports that she does not drink  alcohol and does not use drugs.  Physical Exam: BP (!) 153/82   Pulse 92   Ht 5\' 4"  (1.626 m)   Wt 275 lb (124.7 kg)   BMI 47.20 kg/m    Constitutional:  Alert and oriented, No acute distress. Cardiovascular: No clubbing, cyanosis, or edema. Respiratory: Normal respiratory effort, no increased work of breathing. GI: Abdomen is soft, nontender, nondistended, no abdominal masses  Assessment & Plan:   60 year old comorbid female with morbid obesity, diabetes, newly diagnosed sleep apnea who has not yet started CPAP who reports nocturia and some urgency overnight.  I suspect this is a commendation of her diabetes, obesity, and primarily her sleep apnea.  We discussed behavioral strategies including avoiding bladder irritants, minimizing fluids before bed, voiding  prior to bedtime, and CPAP compliance.  Anticipate her urinary symptoms will improve significantly with the strategies.  Return precautions discussed extensively.  Follow-up with urology as needed   Nickolas Madrid, MD 10/14/2021  [place] [address], [place],  [address] [phone]

## 2021-10-15 ENCOUNTER — Encounter: Payer: Self-pay | Admitting: Family Medicine

## 2021-10-19 ENCOUNTER — Encounter: Payer: Self-pay | Admitting: Family Medicine

## 2021-11-07 ENCOUNTER — Ambulatory Visit: Payer: BC Managed Care – PPO | Admitting: Family Medicine

## 2021-11-13 ENCOUNTER — Ambulatory Visit: Payer: BC Managed Care – PPO | Admitting: Urology

## 2021-11-13 NOTE — Progress Notes (Signed)
Name: Lindsay Mcdonald   MRN: [id number]    DOB: May 30, 1961   Date:11/18/2021       Progress Note  Subjective  Chief Complaint  Follow Up  HPI  Mild Major Depression/insomnia: she is afraid of taking medications, explained again that SSRI is safer than BZD.   She has been on alprazolam XR and is afraid to stop taking medication, we will continue current dose  . Phq 9 is stable.  Denies suicidal thoughts or ideation. She is now living with her daughter due to financial difficulties and sleeping in a sofa . She is also stressed at the new job, has a quota to meet and is stressful.   OA: seeing Dr. Candelaria Stagers and had steroid injections on both knees ,no effusion or redness, she has some pain on right popliteal fossa, she has a bakers cysts, knees are stiff intermittently but doing better, also had steroid injections on both shoulder .   FMS: she has a long history of feeling aching and tender worse on trunk and upper extremities. Pain on shoulders, arms, stiff on her hips . Feels a little stiff when first gets up, she has some mental fogginess, she has tried Lyrica and Duloxetine but stopped on her own. She is taking Tylenol arthritis but still taking ibuprofen daily but down to once a day    DMII:glucose lately has been elevated due to prednisone  A1C went from 6.6 % to 7.9% to 8.1% ,6.7 %, 7 % 6.6 % , 7.1 % ,8.1 % to 7.1 % again She is back on Xultopahy 50 units also taking Metformin.   Advised to take both Metformin 1500 mg daily .  No polyphagia, polydipsia or polyuria. She states occasional has tingling on her toes .She also has associated HTN, dyslipidemia, neuropathy and obesity. Symptoms stable, we will recheck labs next visit    Snoring: waking up gasping for air, has DM, HTN, and obesity, she states she will try to lose weight.  She had a sleep study and is due for titration but she is worried about it    HTN: she is now on Benicar 40 mg, no chest pain or palpation . BP is okay     Hyperlipidemia: she was on Zetia but also stopped due to  muscle pain, she was not able to take Crestor daily but able to tolerate taking it every other day, muscle aches not present and fatigue improved when taking less days a week. We will try changing to 5 mg and see if can take it daily    Morbid Obesity: BMI is still high, she is up another 7 lbs. She states did not follow a healthy diet during the holidays. 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 Pain was sharp and shooting down left elbow, it was affecting her sleep, she saw Dr. Rudene Christians and he recommend evaluation by neurosurgeon and PT, however in the mean time she saw Dr. Candelaria Stagers since and steroid injections on shoulder improved symptoms . She is worried about cost and risk associated with neck surgery   Patient Active Problem List   Diagnosis Date Noted   Type 2 diabetes mellitus ([place]) 05/21/2021   Hypertension associated with diabetes ([place]) 08/31/2020   History of marijuana use 03/30/2019   Fibromyalgia 12/31/2018   Venous vascular  malformations 03/27/2016   Non compliance w medication regimen 09/26/2015   Seasonal allergic rhinitis 09/26/2015   Muscle spasms of neck 06/19/2015   Insomnia 06/19/2015   Primary osteoarthritis of right knee 06/17/2015   Depression, major, recurrent, mild ([place]) 06/17/2015   Hyperlipidemia 06/17/2015   Benign hypertension 06/17/2015   Morbid obesity with BMI of 40.0-44.9, adult ([place]) 06/17/2015   Vitamin D deficiency 06/17/2015    Past Surgical History:  Procedure Laterality Date   BREAST REDUCTION SURGERY     ETHMOIDECTOMY Right 05/16/2020   Procedure: ETHMOIDECTOMY;  Surgeon: Margaretha Sheffield, MD;  Location: [facility] ORS;  Service: ENT;  Laterality: Right;   EXCISION ORAL TUMOR N/A 05/16/2020   Procedure: EXCISION OF SOFT PALATE  PAPILLOMA;  Surgeon: Margaretha Sheffield, MD;  Location: [facility] ORS;  Service: ENT;  Laterality: N/A;   FRONTAL SINUS EXPLORATION Right 05/16/2020   Procedure: FRONTAL SINUS EXPLORATION;  Surgeon: Margaretha Sheffield, MD;  Location: [facility] ORS;  Service: ENT;  Laterality: Right;   IMAGE GUIDED SINUS SURGERY N/A 05/16/2020   Procedure: IMAGE GUIDED SINUS SURGERY;  Surgeon: Margaretha Sheffield, MD;  Location: [facility] ORS;  Service: ENT;  Laterality: N/A;   MAXILLARY ANTROSTOMY Bilateral 05/16/2020   Procedure: MAXILLARY ANTROSTOMY with tissue removal;  Surgeon: Margaretha Sheffield, MD;  Location: [facility] ORS;  Service: ENT;  Laterality: Bilateral;   nasal endoscopic     OOPHORECTOMY  ?   one ovary removed    REDUCTION MAMMAPLASTY Bilateral 2000   removal of ovary     SPHENOIDECTOMY Right 05/16/2020   Procedure: SPHENOIDECTOMY;  Surgeon: Margaretha Sheffield, MD;  Location: [facility] ORS;  Service: ENT;  Laterality: Right;    Family History  Problem Relation Age of Onset   Diabetes Mother    Cancer Mother 15       Breast   Breast cancer Mother 40   Gout Mother    Diabetes Father    Diabetes Daughter        Oldest Daughter   Breast cancer Maternal Aunt 55   Kidney cancer Maternal Aunt    Kidney disease Maternal Aunt    Stroke Maternal Uncle    Prostate cancer Neg Hx    Bladder Cancer Neg Hx     Social History   Tobacco Use   Smoking status: Never   Smokeless tobacco: Never  Substance Use Topics   Alcohol use: No    Alcohol/week: 0.0 standard drinks     Current Outpatient Medications:    albuterol (VENTOLIN HFA) 108 (90 Base) MCG/ACT inhaler, Inhale 2 puffs into the lungs every 6 (six) hours as needed for wheezing or shortness of breath., Disp: 8 g, Rfl: 2   aspirin EC 81 MG tablet, Take 81 mg by mouth daily. Swallow whole., Disp: , Rfl:    baclofen (LIORESAL) 10 MG tablet, Take 1 tablet (10 mg total) by mouth 2 (two) times daily as needed for muscle spasms., Disp: 60 each, Rfl: 0   fluticasone (FLONASE) 50 MCG/ACT nasal  spray, Place 2 sprays into both nostrils daily., Disp: 16 g, Rfl: 6   Insulin Degludec-Liraglutide (XULTOPHY) 100-3.6 UNIT-MG/ML SOPN, Inject 50 Units into the skin daily., Disp: 45 mL, Rfl: 1   Melatonin 10 MG TABS, Take 10 mg by mouth at bedtime as needed (sleep). , Disp: , Rfl:    olmesartan (BENICAR) 40 MG tablet, Take 1 tablet (40 mg total) by mouth daily. In place of lisinopril for bp, Disp: 90 tablet, Rfl: 1   ALPRAZolam (XANAX XR) 0.5 MG  24 hr tablet, Take 1 tablet (0.5 mg total) by mouth daily., Disp: 30 tablet, Rfl: 2   metFORMIN (GLUCOPHAGE-XR) 750 MG 24 hr tablet, Take 2 tablets (1,500 mg total) by mouth daily with breakfast., Disp: 180 tablet, Rfl: 1   rosuvastatin (CRESTOR) 5 MG tablet, Take 1 tablet (5 mg total) by mouth daily., Disp: 90 tablet, Rfl: 0  Allergies  Allergen Reactions   Simvastatin     Muscle cramps    Atorvastatin Other (See Comments)    muscle cramps   Compazine Other (See Comments)    Twitching and spasms of neck muscles   Penicillins Rash    Reaction under 25, patient unsure that she still has an allergy    I personally reviewed active problem list, medication list, allergies, family history, social history, health maintenance with the patient/caregiver today.   ROS  Constitutional: Negative for fever, positive for  weight change.  Respiratory: Negative for cough and shortness of breath.   Cardiovascular: Negative for chest pain or palpitations.  Gastrointestinal: Negative for abdominal pain, no bowel changes.  Musculoskeletal: Negative for gait problem or joint swelling.  Skin: Negative for rash.  Neurological: Negative for dizziness or headache.  No other specific complaints in a complete review of systems (except as listed in HPI above).   Objective  Vitals:   11/18/21 1029  BP: 138/82  Pulse: 89  Resp: 16  Temp: 98.3 F (36.8 C)  SpO2: 97%  Weight: 280 lb (127 kg)  Height: 5\' 4"  (1.626 m)    Body mass index is 48.06  kg/m.  Physical Exam  Constitutional: Patient appears well-developed and well-nourished. Obese  No distress.  HEENT: head atraumatic, normocephalic, pupils equal and reactive to light, neck supple Cardiovascular: Normal rate, regular rhythm and normal heart sounds.  No murmur heard. No BLE edema. Pulmonary/Chest: Effort normal and breath sounds normal. No respiratory distress. Abdominal: Soft.  There is no tenderness. Psychiatric: Patient has a normal mood and affect. behavior is normal. Judgment and thought content normal.   Recent Results (from the past 2160 hour(s))  Cervicovaginal ancillary only     Status: None   Collection Time: 08/21/21  9:35 AM  Result Value Ref Range   Neisseria Gonorrhea Negative    Chlamydia Negative    Trichomonas Negative    Bacterial Vaginitis (gardnerella) Negative    Candida Vaginitis Negative    Candida Glabrata Negative    Comment Normal Reference Range Candida Species - Negative    Comment Normal Reference Range Candida Galbrata - Negative    Comment Normal Reference Range Trichomonas - Negative    Comment Normal Reference [place] Chlamydia - Negative    Comment      Normal Reference Range Neisseria Gonorrhea - Negative   Comment      Normal Reference Range Bacterial Vaginosis - Negative  POCT HgB A1C     Status: Abnormal   Collection Time: 08/21/21  9:36 AM  Result Value Ref Range   Hemoglobin A1C 8.1 (A) 4.0 - 5.6 %   HbA1c POC (<> result, manual entry)     HbA1c, POC (prediabetic range)     HbA1c, POC (controlled diabetic range)    POCT Urinalysis Dipstick     Status: Abnormal   Collection Time: 08/21/21  9:37 AM  Result Value Ref Range   Color, UA Yellow    Clarity, UA Clear    Glucose, UA Negative Negative   Bilirubin, UA Negative    Ketones, UA Negative  Spec Grav, UA >=1.030 (A) 1.010 - 1.025   Blood, UA Negative    pH, UA 6.0 5.0 - 8.0   Protein, UA Negative Negative   Urobilinogen, UA 0.2 0.2 or 1.0 E.U./dL   Nitrite, UA  Negative    Leukocytes, UA Negative Negative   Appearance     Odor    CULTURE, URINE COMPREHENSIVE     Status: None   Collection Time: 08/27/21 11:15 AM   Specimen: Urine  Result Value Ref Range   MICRO NUMBER: [id number]    SPECIMEN QUALITY: Adequate    Source OTHER (SPECIFY)    STATUS: FINAL    RESULT: No Growth   POCT urinalysis dipstick     Status: Abnormal   Collection Time: 09/17/21  2:56 PM  Result Value Ref Range   Color, UA gold    Clarity, UA cloudy    Glucose, UA Negative Negative   Bilirubin, UA negative    Ketones, UA 80    Spec Grav, UA 1.020 1.010 - 1.025   Blood, UA small    pH, UA 5.0 5.0 - 8.0   Protein, UA Positive (A) Negative   Urobilinogen, UA 0.2 0.2 or 1.0 E.U./dL   Nitrite, UA negative    Leukocytes, UA Moderate (2+) (A) Negative   Appearance cloudy    Odor none   Urine Culture     Status: None   Collection Time: 09/17/21  3:34 PM   Specimen: Urine  Result Value Ref Range   MICRO NUMBER: [id number]    SPECIMEN QUALITY: Adequate    Sample Source URINE    STATUS: FINAL    Result:      Mixed genital flora isolated. These superficial bacteria are not indicative of a urinary tract infection. No further organism identification is warranted on this specimen. If clinically indicated, recollect clean-catch, mid-stream urine and transfer  immediately to Urine Culture Transport Tube.   SARS CORONAVIRUS 2 (TAT 6-24 HRS) Nasopharyngeal Nasopharyngeal Swab     Status: None   Collection Time: 09/27/21  9:02 AM   Specimen: Nasopharyngeal Swab  Result Value Ref Range   SARS Coronavirus 2 NEGATIVE NEGATIVE    Comment: (NOTE) SARS-CoV-2 target nucleic acids are NOT DETECTED.  The SARS-CoV-2 RNA is generally detectable in upper and lower respiratory specimens during the acute phase of infection. Negative results do not preclude SARS-CoV-2 infection, do not rule out co-infections with other pathogens, and should not be used as the sole basis for treatment or  other patient management decisions. Negative results must be combined with clinical observations, patient history, and epidemiological information. The expected result is Negative.  Fact Sheet for Patients: SugarRoll.be  Fact Sheet for Healthcare Providers: https://www.woods-mathews.com/  This test is not yet approved or cleared by the [place] FDA and  has been authorized for detection and/or diagnosis of SARS-CoV-2 by FDA under an Emergency Use Authorization (EUA). This EUA will remain  in effect (meaning this test can be used) for the duration of the COVID-19 declaration under Se ction 564(b)(1) of the Act, 21 U.S.C. section 360bbb-3(b)(1), unless the authorization is terminated or revoked sooner.  Performed at [facility], [place] [address] [place], [place] [address]   Urinalysis, Complete     Status: Abnormal   Collection Time: 10/14/21 10:45 AM  Result Value Ref Range   Specific Gravity, UA >1.030 (H) 1.005 - 1.030   pH, UA 5.5 5.0 - 7.5   Color, UA Yellow Yellow   Appearance Ur  Cloudy (A) Clear   Leukocytes,UA Trace (A) Negative   Protein,UA Negative Negative/Trace   Glucose, UA 2+ (A) Negative   Ketones, UA Negative Negative   RBC, UA Negative Negative   Bilirubin, UA Negative Negative   Urobilinogen, Ur 0.2 0.2 - 1.0 mg/dL   Nitrite, UA Negative Negative   Microscopic Examination See below:   Microscopic Examination     Status: None   Collection Time: 10/14/21 10:45 AM   Urine  Result Value Ref Range   WBC, UA 0-5 0 - 5 /hpf   RBC 0-2 0 - 2 /hpf   Epithelial Cells (non renal) 0-10 0 - 10 /hpf   Bacteria, UA Few None seen/Few  BLADDER SCAN AMB NON-IMAGING     Status: None   Collection Time: 10/14/21 11:06 AM  Result Value Ref Range   Scan Result 21ml   POCT HgB A1C     Status: Abnormal   Collection Time: 11/18/21 10:41 AM  Result Value Ref Range   Hemoglobin A1C 7.1 (A) 4.0 - 5.6 %   HbA1c POC (<> result,  manual entry)     HbA1c, POC (prediabetic range)     HbA1c, POC (controlled diabetic range)      Diabetic Foot Exam: Diabetic Foot Exam - Simple   Simple Foot Form Visual Inspection No deformities, no ulcerations, no other skin breakdown bilaterally: Yes Sensation Testing Intact to touch and monofilament testing bilaterally: Yes Pulse Check Posterior Tibialis and Dorsalis pulse intact bilaterally: Yes Comments      PHQ2/9: Depression screen [facility] 2/9 11/18/2021 09/17/2021 08/21/2021 05/21/2021 02/22/2021  Decreased Interest 1 1 2 1 1   Down, Depressed, Hopeless 1 1 1 1 1   PHQ - 2 Score 2 2 3 2 2   Altered sleeping 0 0 0 0 0  Tired, decreased energy 0 0 1 1 1   Change in appetite 0 0 0 0 1  Feeling bad or failure about yourself  0 0 0 0 1  Trouble concentrating 0 0 0 0 0  Moving slowly or fidgety/restless 0 0 0 0 0  Suicidal thoughts 0 0 0 0 0  PHQ-9 Score 2 2 4 3 5   Difficult doing work/chores Somewhat difficult Not difficult at all - Not difficult at all -  Some recent data might be hidden    phq 9 is positive   Fall Risk: Fall Risk  11/18/2021 09/17/2021 08/21/2021 05/21/2021 02/22/2021  Falls in the past year? 0 0 0 0 0  Number falls in past yr: 0 0 0 0 0  Injury with Fall? 0 0 0 0 0  Risk for fall due to : No Fall Risks - No Fall Risks - -  Follow up Falls prevention discussed Falls evaluation completed Falls prevention discussed - -      Functional Status Survey: Is the patient deaf or have difficulty hearing?: No Does the patient have difficulty seeing, even when wearing glasses/contacts?: Yes Does the patient have difficulty concentrating, remembering, or making decisions?: Yes Does the patient have difficulty walking or climbing stairs?: No Does the patient have difficulty dressing or bathing?: No Does the patient have difficulty doing errands alone such as visiting a doctor's office or shopping?: No    Assessment & Plan  1. Diabetes mellitus type 2 in obese  (HCC)  - POCT HgB A1C - HM Diabetes Foot Exam  2. GAD (generalized anxiety disorder)  - ALPRAZolam (XANAX XR) 0.5 MG 24 hr tablet; Take 1 tablet (0.5 mg total) by  mouth daily.  Dispense: 30 tablet; Refill: 2  3. Diabetic peripheral neuropathy associated with type 2 diabetes mellitus (HCC)  - metFORMIN (GLUCOPHAGE-XR) 750 MG 24 hr tablet; Take 2 tablets (1,500 mg total) by mouth daily with breakfast.  Dispense: 180 tablet; Refill: 1  4. Hypertension associated with diabetes ([place])   5. Snoring  Discussed going back for sleep titration   6. Fibromyalgia   7. Dyslipidemia associated with type 2 diabetes mellitus ([place])   8. Morbid obesity with BMI of 40.0-44.9, adult [facility])  Discussed with the patient the risk posed by an increased BMI. Discussed importance of portion control, calorie counting and at least 150 minutes of physical activity weekly. Avoid sweet beverages and drink more water. Eat at least 6 servings of fruit and vegetables daily    9. Primary osteoarthritis of both knees

## 2021-11-15 ENCOUNTER — Encounter: Payer: Self-pay | Admitting: Family Medicine

## 2021-11-18 ENCOUNTER — Encounter: Payer: Self-pay | Admitting: Family Medicine

## 2021-11-18 ENCOUNTER — Ambulatory Visit (INDEPENDENT_AMBULATORY_CARE_PROVIDER_SITE_OTHER): Payer: BC Managed Care – PPO | Admitting: Family Medicine

## 2021-11-18 ENCOUNTER — Other Ambulatory Visit: Payer: Self-pay

## 2021-11-18 VITALS — BP 138/82 | HR 89 | Temp 98.3°F | Resp 16 | Ht 64.0 in | Wt 280.0 lb

## 2021-11-18 DIAGNOSIS — E669 Obesity, unspecified: Secondary | ICD-10-CM | POA: Diagnosis not present

## 2021-11-18 DIAGNOSIS — E1159 Type 2 diabetes mellitus with other circulatory complications: Secondary | ICD-10-CM

## 2021-11-18 DIAGNOSIS — E1142 Type 2 diabetes mellitus with diabetic polyneuropathy: Secondary | ICD-10-CM

## 2021-11-18 DIAGNOSIS — E1169 Type 2 diabetes mellitus with other specified complication: Secondary | ICD-10-CM

## 2021-11-18 DIAGNOSIS — F411 Generalized anxiety disorder: Secondary | ICD-10-CM | POA: Diagnosis not present

## 2021-11-18 DIAGNOSIS — Z23 Encounter for immunization: Secondary | ICD-10-CM

## 2021-11-18 DIAGNOSIS — I152 Hypertension secondary to endocrine disorders: Secondary | ICD-10-CM

## 2021-11-18 DIAGNOSIS — M170 Bilateral primary osteoarthritis of knee: Secondary | ICD-10-CM

## 2021-11-18 DIAGNOSIS — M797 Fibromyalgia: Secondary | ICD-10-CM

## 2021-11-18 DIAGNOSIS — E785 Hyperlipidemia, unspecified: Secondary | ICD-10-CM

## 2021-11-18 DIAGNOSIS — R0683 Snoring: Secondary | ICD-10-CM

## 2021-11-18 DIAGNOSIS — Z6841 Body Mass Index (BMI) 40.0 and over, adult: Secondary | ICD-10-CM

## 2021-11-18 LAB — POCT GLYCOSYLATED HEMOGLOBIN (HGB A1C): Hemoglobin A1C: 7.1 % — AB (ref 4.0–5.6)

## 2021-11-18 MED ORDER — METFORMIN HCL ER 750 MG PO TB24: 1500.0000 mg | ORAL_TABLET | Freq: Every day | ORAL | 1 refills | Status: DC

## 2021-11-18 MED ORDER — ROSUVASTATIN CALCIUM 5 MG PO TABS: 5.0000 mg | ORAL_TABLET | Freq: Every day | ORAL | 0 refills | Status: DC

## 2021-11-18 MED ORDER — ALPRAZOLAM ER 0.5 MG PO TB24: 0.5000 mg | ORAL_TABLET | Freq: Every day | ORAL | 2 refills | Status: DC

## 2021-11-20 ENCOUNTER — Encounter: Payer: Self-pay | Admitting: Family Medicine

## 2021-12-05 ENCOUNTER — Ambulatory Visit (INDEPENDENT_AMBULATORY_CARE_PROVIDER_SITE_OTHER): Payer: BC Managed Care – PPO

## 2021-12-05 DIAGNOSIS — Z23 Encounter for immunization: Secondary | ICD-10-CM

## 2021-12-06 ENCOUNTER — Encounter: Payer: Self-pay | Admitting: Family Medicine

## 2021-12-06 ENCOUNTER — Telehealth: Payer: Self-pay

## 2021-12-06 ENCOUNTER — Other Ambulatory Visit: Payer: Self-pay | Admitting: Family Medicine

## 2021-12-06 DIAGNOSIS — G8929 Other chronic pain: Secondary | ICD-10-CM

## 2021-12-06 NOTE — Telephone Encounter (Signed)
Lvm for pt to call and schedule an appt for disability forms to be filled out

## 2021-12-06 NOTE — Telephone Encounter (Signed)
Left voicemail for return call at patient's convenience to discuss ADA/Leave of absence paperwork.

## 2021-12-19 ENCOUNTER — Ambulatory Visit: Payer: BC Managed Care – PPO | Admitting: Family Medicine

## 2022-01-15 ENCOUNTER — Encounter: Payer: Self-pay | Admitting: Emergency Medicine

## 2022-01-15 ENCOUNTER — Emergency Department: Payer: BC Managed Care – PPO

## 2022-01-15 ENCOUNTER — Emergency Department
Admission: EM | Admit: 2022-01-15 | Discharge: 2022-01-15 | Disposition: A | Discharge status: Home / Self Care | Payer: BC Managed Care – PPO | Attending: Emergency Medicine | Admitting: Emergency Medicine

## 2022-01-15 ENCOUNTER — Ambulatory Visit: Payer: Self-pay

## 2022-01-15 ENCOUNTER — Other Ambulatory Visit: Payer: Self-pay

## 2022-01-15 DIAGNOSIS — I10 Essential (primary) hypertension: Secondary | ICD-10-CM | POA: Insufficient documentation

## 2022-01-15 DIAGNOSIS — R0789 Other chest pain: Secondary | ICD-10-CM | POA: Insufficient documentation

## 2022-01-15 DIAGNOSIS — R079 Chest pain, unspecified: Secondary | ICD-10-CM | POA: Diagnosis not present

## 2022-01-15 DIAGNOSIS — E119 Type 2 diabetes mellitus without complications: Secondary | ICD-10-CM | POA: Diagnosis not present

## 2022-01-15 LAB — CBC
HCT: 40.8 % (ref 36.0–46.0)
Hemoglobin: 12.8 g/dL (ref 12.0–15.0)
MCH: 25.8 pg — ABNORMAL LOW (ref 26.0–34.0)
MCHC: 31.4 g/dL (ref 30.0–36.0)
MCV: 82.3 fL (ref 80.0–100.0)
Platelets: 271 10*3/uL (ref 150–400)
RBC: 4.96 MIL/uL (ref 3.87–5.11)
RDW: 13.2 % (ref 11.5–15.5)
WBC: 8.7 10*3/uL (ref 4.0–10.5)
nRBC: 0 % (ref 0.0–0.2)

## 2022-01-15 LAB — BASIC METABOLIC PANEL
Anion gap: 10 (ref 5–15)
BUN: 16 mg/dL (ref 6–20)
CO2: 27 mmol/L (ref 22–32)
Calcium: 8.8 mg/dL — ABNORMAL LOW (ref 8.9–10.3)
Chloride: 101 mmol/L (ref 98–111)
Creatinine, Ser: 0.68 mg/dL (ref 0.44–1.00)
GFR, Estimated: >60 mL/min (ref >60)
Glucose, Bld: 142 mg/dL — ABNORMAL HIGH (ref 70–99)
Potassium: 3.7 mmol/L (ref 3.5–5.1)
Sodium: 138 mmol/L (ref 135–145)

## 2022-01-15 LAB — TROPONIN I (HIGH SENSITIVITY)
Troponin I (High Sensitivity): 5 ng/L (ref <18)
Troponin I (High Sensitivity): 4 ng/L (ref <18)

## 2022-01-15 IMAGING — CR DG CHEST 2V
1 series · 2 of 2 positions shown · non-contrast
Comparison: [DATE]

CLINICAL DATA: Chest pain

EXAM:
CHEST - 2 VIEW

[Series 1: dg chest 2 view · 0.14mm/px · 2 of 2 slices shown]
[im 1/2]
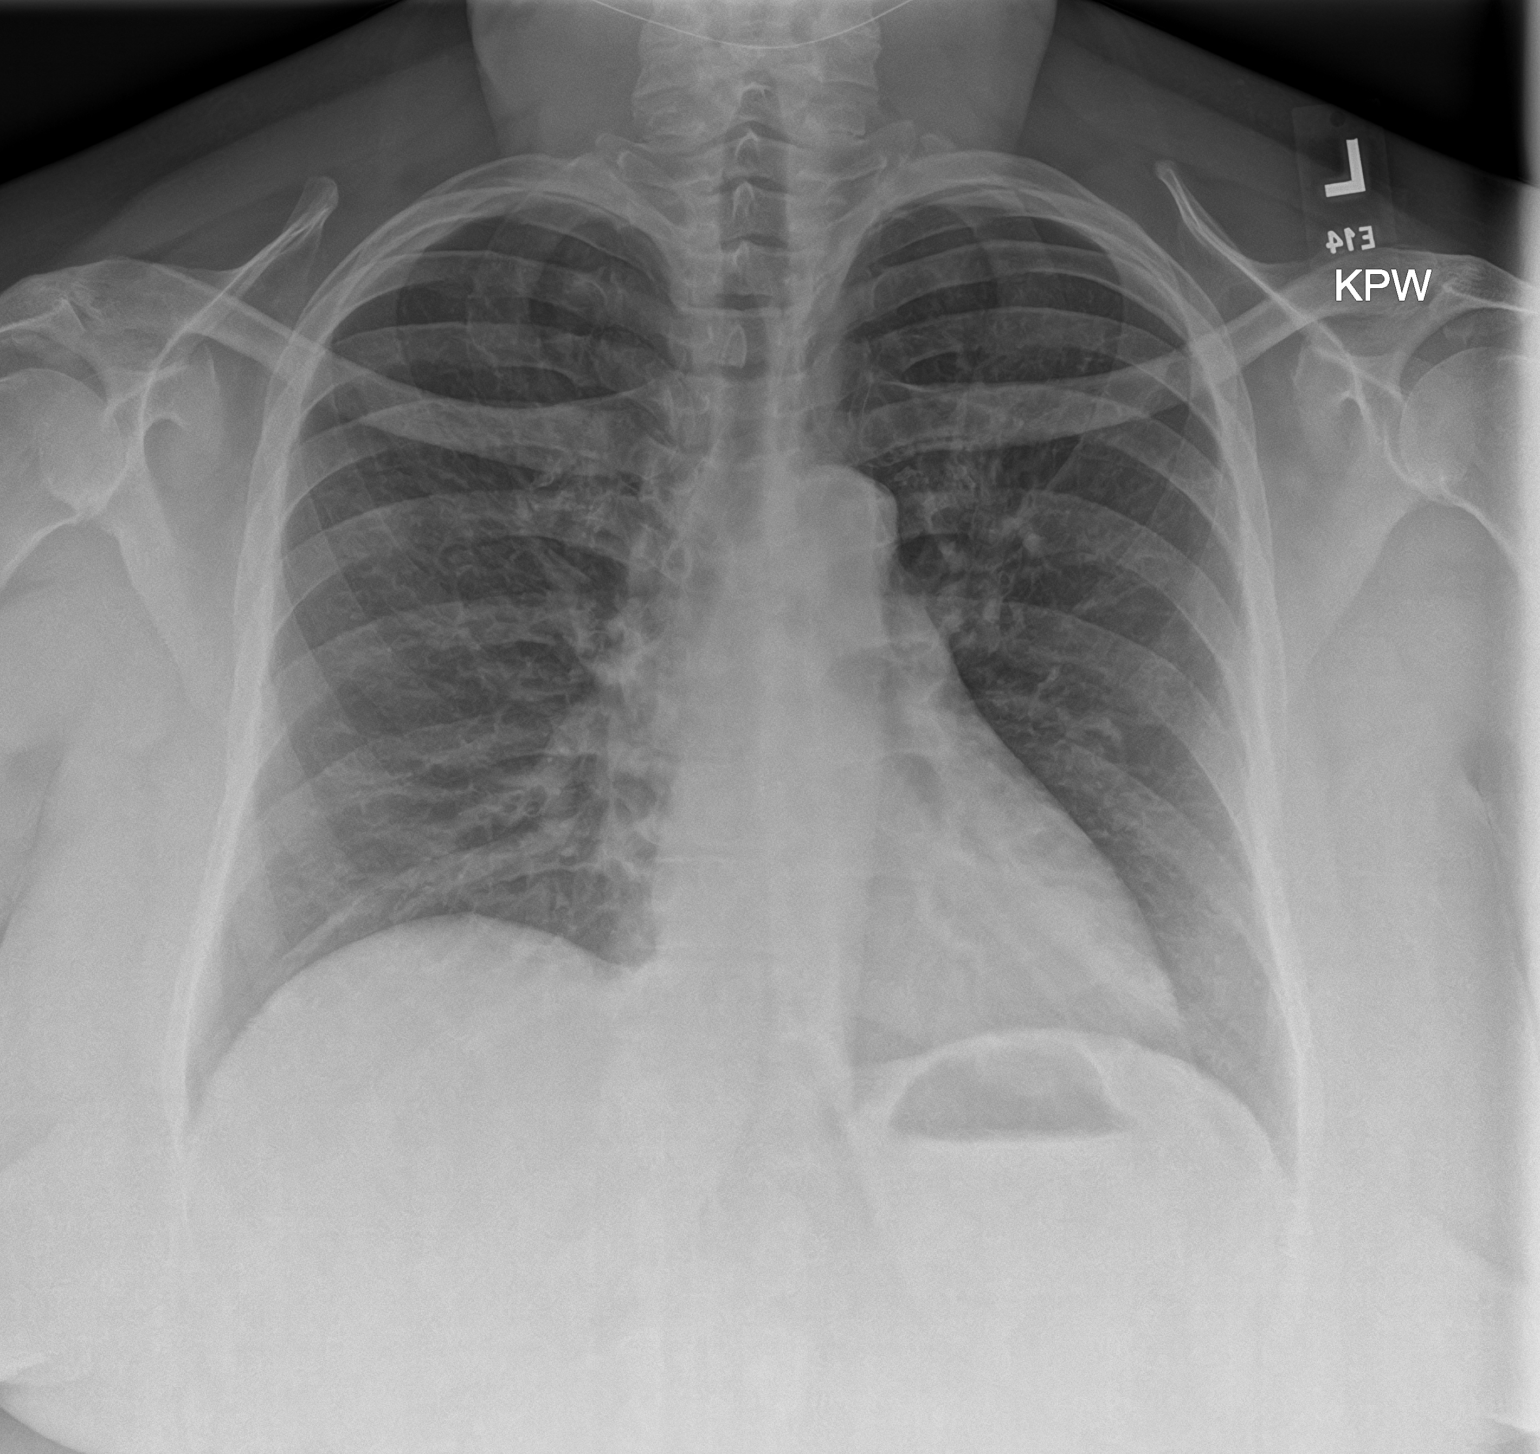
[im 2/2]
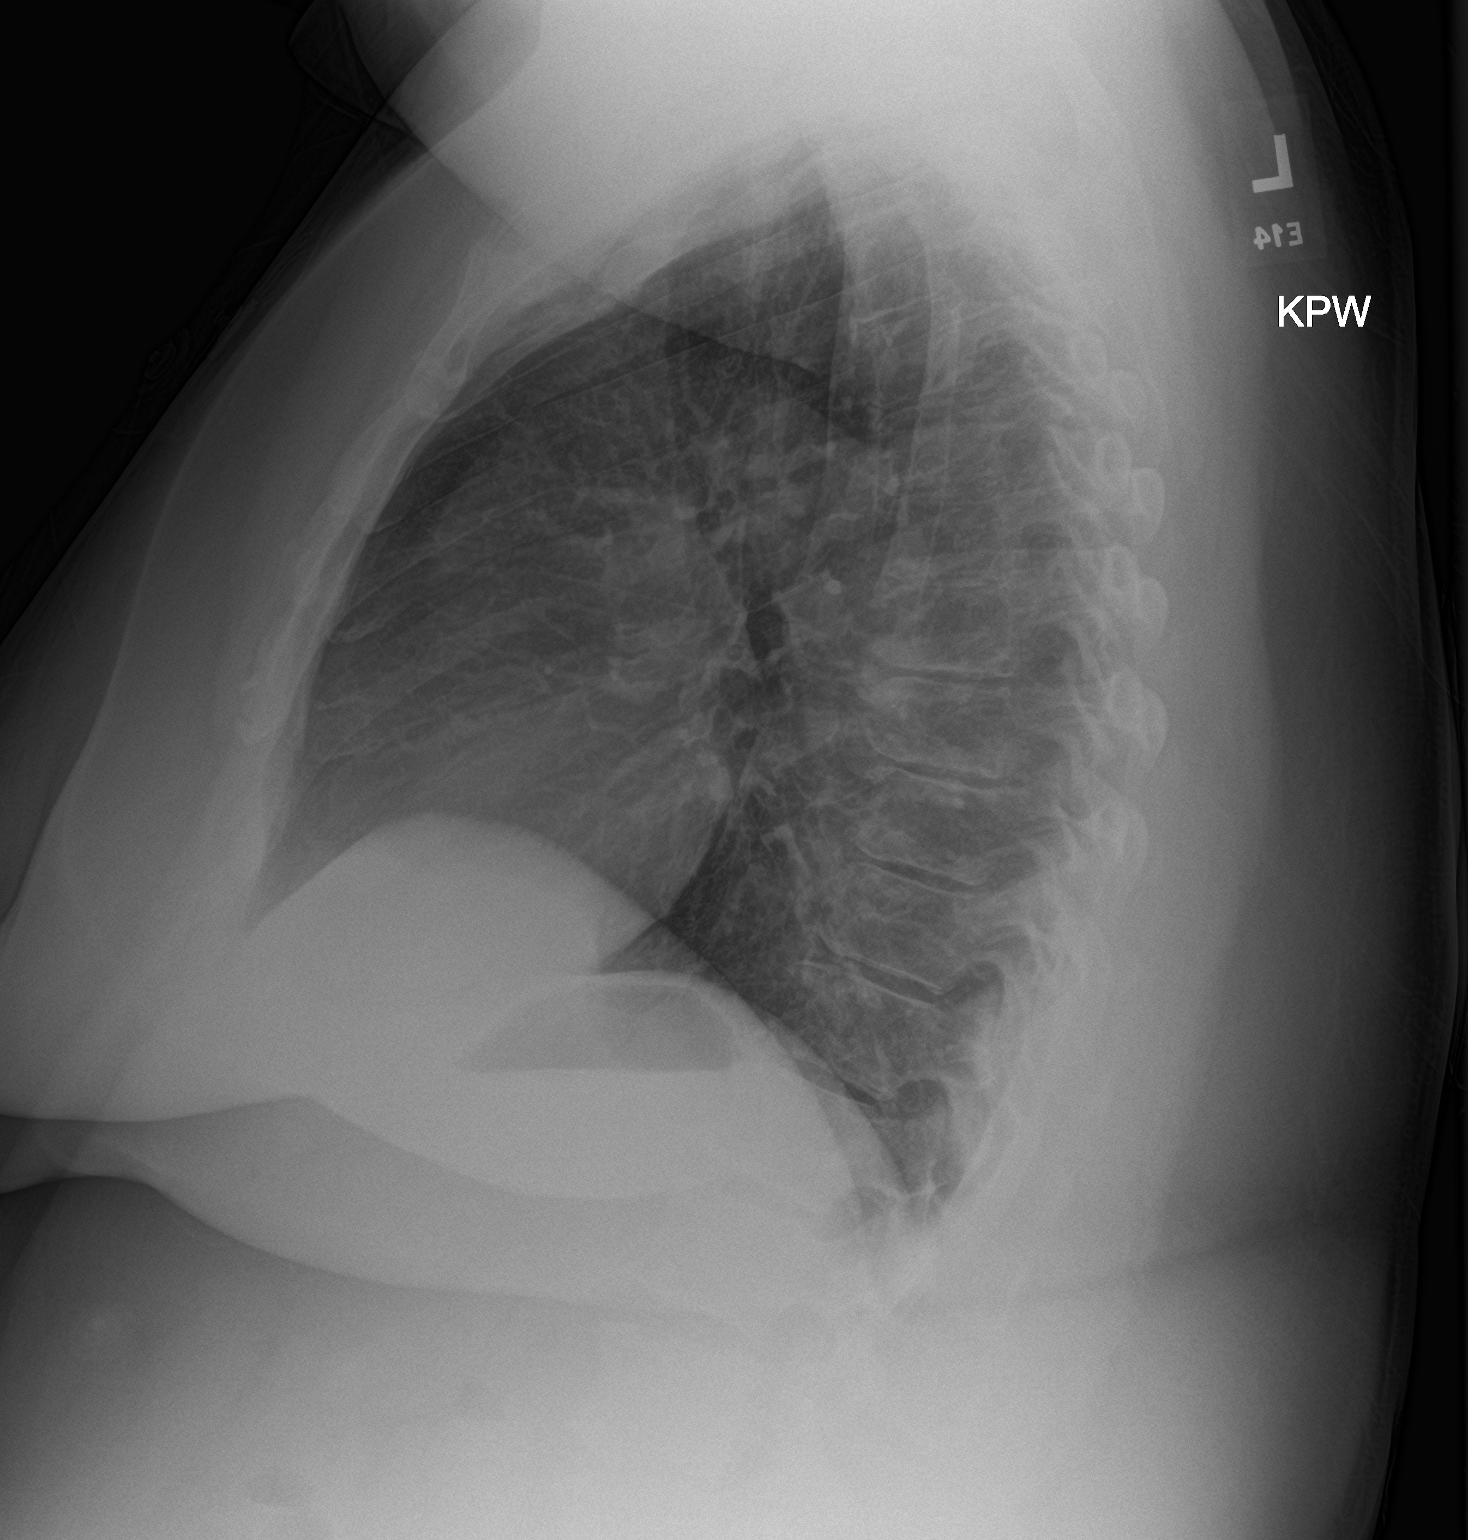

[2 of 2 positions shown; findings below may reference images not displayed]

FINDINGS: The heart size and mediastinal contours are within normal limits.
Both lungs are clear. The visualized skeletal structures are
unremarkable.
IMPRESSION: No active cardiopulmonary disease.

## 2022-01-15 NOTE — ED Provider Notes (Signed)
[facility] Provider Note    Event Date/Time   First MD Initiated Contact with Patient 01/15/22 1840     (approximate)   History   Chest Pain   HPI  Lindsay Mcdonald is a 61 y.o. female with hypertension, hyperlipidemia, diabetes who comes in with chest pain.  Patient reports having left-sided chest pain that radiates into the shoulder.  Patient had intermittently throughout the day.  She describes it as more of a sharp pain that gets worse when she pushes on her chest that lasts a few minutes and then goes away.  She denies the pain radiating to her back or any numbness or tingling.  She denies any new shortness of breath or pain with breathing.  Denies any significant sick symptoms.  Denies any swelling in 1 leg, recent surgeries, recent long travel.  She states that she just want to make sure that it was not related to her heart.  She does report a lot of history of arthritis and pain in her left shoulder on that side so she was not sure if it could just be related to that.  She denies that it is exertional at all in nature.  She denies any pain at this time.  She does report a little bit of gassiness when the pain comes on so she states she was not sure if it could just be related to gas as well.  Physical Exam   Triage Vital Signs: ED Triage Vitals  Enc Vitals Group     BP 01/15/22 1733 (!) 156/90     Pulse Rate 01/15/22 1733 94     Resp 01/15/22 1733 17     Temp 01/15/22 1733 98.1 F (36.7 C)     Temp Source 01/15/22 1733 Oral     SpO2 01/15/22 1733 97 %     Weight 01/15/22 1737 279 lb 15.8 oz (127 kg)     Height 01/15/22 1737 5\' 4"  (1.626 m)     Head Circumference --      Peak Flow --      Pain Score 01/15/22 1737 10     Pain Loc --      Pain Edu? --      Excl. in [place]? --     Most recent vital signs: Vitals:   01/15/22 1733  BP: (!) 156/90  Pulse: 94  Resp: 17  Temp: 98.1 F (36.7 C)  SpO2: 97%     General: Awake, no distress.   CV:  Good peripheral perfusion.  No chest wall tenderness at this time Resp:  Normal effort.  Clear lung Abd:  No distention.  Soft nontender Other:  No swelling of the legs.  Sensation intact in arms and legs.  2+ distal pulses radially.   ED Results / Procedures / Treatments   Labs (all labs ordered are listed, but only abnormal results are displayed) Labs Reviewed  BASIC METABOLIC PANEL - Abnormal; Notable for the following components:      Result Value   Glucose, Bld 142 (*)    Calcium 8.8 (*)    All other components within normal limits  CBC - Abnormal; Notable for the following components:   MCH 25.8 (*)    All other components within normal limits  TROPONIN I (HIGH SENSITIVITY)     EKG  My interpretation of EKG:  Normal sinus rate of 88 without any ST elevation or T wave inversions, normal intervals  RADIOLOGY I have reviewed the  xray personally and agree with radiology read no evidence of pneumonia   PROCEDURES:  Critical Care performed: No  .1-3 Lead EKG Interpretation Performed by: Vanessa Vera, MD Authorized by: Vanessa Allen, MD     Interpretation: normal     ECG rate:  60s   ECG rate assessment: normal     Rhythm: sinus rhythm     Ectopy: none     Conduction: normal     MEDICATIONS ORDERED IN ED: Medications - No data to display   IMPRESSION / MDM / [place] / ED COURSE  I reviewed the triage vital signs and the nursing notes.   Differential diagnosis includes, but is not limited to, ACS versus musculoskeletal.  Abdomen is soft and nontender and low suspicion for acute abdominal pathology.  Do not feel like this is a dissection given no pain radiating to her back and good radial pulses with negative chest x-ray.  I do not feel he this is a PE given no shortness of breath or pain with breathing and no evidence of hypoxia or tachycardia.   The patient is on the cardiac monitor to evaluate for evidence of arrhythmia and/or  significant heart rate changes.  Initial troponin was negative BMP is reassuring with normal kidney function CBC is reassuring with no anemia Repeat troponin is negative.   Given patient's risk factors I considered admission for chest pain but given the chest pain sounds atypical in nature and she is had reassuring EKG with 2 negative troponins patient feels comfortable with discharge home and will follow-up outpatient with cardiology.  I reevaluated patient and she continues to have no chest pain and feels comfortable with discharge home  I discussed the provisional nature of ED diagnosis, the treatment so far, the ongoing plan of care, follow up appointments and return precautions with the patient and any family or support people present. They expressed understanding and agreed with the plan, discharged home.      FINAL CLINICAL IMPRESSION(S) / ED DIAGNOSES   Final diagnoses:  Atypical chest pain     Rx / DC Orders   ED Discharge Orders     None        Note:  This document was prepared using Dragon voice recognition software and may include unintentional dictation errors.   Vanessa Hudson, MD 01/15/22 2050

## 2022-01-15 NOTE — Telephone Encounter (Signed)
Pt wants to know if there ia another muscle relaxer than you can take more often, that is safe, non drowsy for neck spasms than baclofen.  ( But wants to keep Baclofen as a back up)   Left message to call back.

## 2022-01-15 NOTE — Discharge Instructions (Signed)
You can call the cardiology number to get follow-up and if you have worsening symptoms or any other concerns please return to the ER for repeat evaluation.  Your blood pressure was slightly elevated but is in the setting of being the ER so please follow this up with your primary care doctor for recheck

## 2022-01-15 NOTE — ED Triage Notes (Signed)
Pt comes into the ED via POV c/o left side chest pain that radiates Into the shoulder.  Pt states the pain has been persistently intermittent throughout the day.  Pt denies any new SHOB, dizziness, or nausea.  Denies any cardiac history.  Pt in NAD with even and unlabored respirations.

## 2022-01-15 NOTE — Telephone Encounter (Signed)
°  Chief Complaint: med request for spasms Symptoms: baclofen makes her drowsy and is unable to take it on a daily basis even tho it helps Frequency: NA Pertinent Negatives: NA Disposition: [] ED /[] Urgent Care (no appt availability in office) / [] Appointment(In office/virtual)/ []  [place] [facility]/ [] Home Care/ [] Refused Recommended Disposition /[] [place] [facility]/ [x]  Follow-up with PCP Additional Notes: No virtual appt was until 02/26/22. Pt has appt coming up on 02/19/22. She asked just to see if something can be sent to the pharmacy.   Reason for Disposition  Prescription request for new medicine (not a refill)  Answer Assessment - Initial Assessment Questions 1. NAME of MEDICATION: "What medicine are you calling about?"     Baclofen wanting to see if there's a replacement to take more often 2. QUESTION: "What is your question?" (e.g., double dose of medicine, side effect)     wanting to see if there's a replacement to take more often, unable to take baclofen when going out. Only takes it a few times a week  3. PRESCRIBING HCP: "Who prescribed it?" Reason: if prescribed by specialist, call should be referred to that group.     Baclofen  Protocols used: Medication Question Call-A-AH

## 2022-01-15 NOTE — Telephone Encounter (Signed)
Second attempt to reach pt. Message left to call back. 

## 2022-01-16 NOTE — Telephone Encounter (Signed)
Pt called back she states pharmacist recommended for her to ask you if Tizanidine could be an option since its cheaper compared to Skelaxin. Also mentioned she has been told before that Baclofen can harm in some way.

## 2022-01-16 NOTE — Telephone Encounter (Signed)
Spoke to pt and pt states she will call her pharmacy to figure out the cost. Pt states she does not want to get off completely from Baclofen but is interested in something similar that does not make her drowsy in a daily basis.

## 2022-01-17 NOTE — Telephone Encounter (Signed)
Left vm letting pt know Tizanidine is also sedating. That Dr. Ancil Boozer will discuss it further at her next appt and we will not change medication at this time. I left pt our callback number if she has anymore questions.

## 2022-01-23 ENCOUNTER — Other Ambulatory Visit: Payer: Self-pay

## 2022-01-23 ENCOUNTER — Ambulatory Visit: Payer: BC Managed Care – PPO | Admitting: Cardiology

## 2022-01-23 ENCOUNTER — Encounter: Payer: Self-pay | Admitting: Cardiology

## 2022-01-23 ENCOUNTER — Encounter: Payer: Self-pay | Admitting: Family Medicine

## 2022-01-23 VITALS — BP 140/100 | HR 82 | Ht 64.0 in | Wt 270.0 lb

## 2022-01-23 DIAGNOSIS — G8929 Other chronic pain: Secondary | ICD-10-CM

## 2022-01-23 DIAGNOSIS — M25511 Pain in right shoulder: Secondary | ICD-10-CM

## 2022-01-23 DIAGNOSIS — E7800 Pure hypercholesterolemia, unspecified: Secondary | ICD-10-CM | POA: Diagnosis not present

## 2022-01-23 DIAGNOSIS — E1142 Type 2 diabetes mellitus with diabetic polyneuropathy: Secondary | ICD-10-CM

## 2022-01-23 DIAGNOSIS — R072 Precordial pain: Secondary | ICD-10-CM | POA: Diagnosis not present

## 2022-01-23 DIAGNOSIS — I10 Essential (primary) hypertension: Secondary | ICD-10-CM

## 2022-01-23 DIAGNOSIS — M62838 Other muscle spasm: Secondary | ICD-10-CM

## 2022-01-23 DIAGNOSIS — M797 Fibromyalgia: Secondary | ICD-10-CM

## 2022-01-23 DIAGNOSIS — M170 Bilateral primary osteoarthritis of knee: Secondary | ICD-10-CM

## 2022-01-23 MED ORDER — HYDROCHLOROTHIAZIDE 25 MG PO TABS: 25.0000 mg | ORAL_TABLET | Freq: Every day | ORAL | 3 refills | Status: DC

## 2022-01-23 NOTE — Progress Notes (Signed)
Cardiology Office Note:    Date:  01/23/2022   ID:  Lindsay Mcdonald, DOB 1961-09-04, MRN [id number]  PCP:  Steele Sizer, MD   [facility] Providers Cardiologist:  Kate Sable, MD     Referring MD: Steele Sizer, MD   Chief Complaint  Patient presents with   ED F/U-chest discomfort; Referred by Dr. Ellwood Handler Counts is a 61 y.o. female who is being seen today for the evaluation of chest pain at the request of Steele Sizer, MD.   History of Present Illness:    Lindsay Mcdonald is a 61 y.o. female with a hx of hypertension, hyperlipidemia, diabetes, obesity who presents due to chest pain.  States having symptoms of chest discomfort 2 weeks ago while at work.  She works for a call center.  Symptoms lasted a few minutes then went away.  Symptoms later on return couple of hours later prompting patient to go to the emergency room.  Was seen in the ED 01/10/2022 .  Troponins obtained in the ED were normal, EKG did not show any acute ischemia.  Patient was referred to see cardiology.  She states having occasional chest discomfort, not related with exertion.  Denies any history of heart disease.  Does not check her BP at home.  Past Medical History:  Diagnosis Date   Anxiety    Arthritis 03/2020   osteoarthritis both knees   Chronic pain of right knee    Chronic sinusitis    DDD (degenerative disc disease), cervical    cervical radiculopathy   Depression    Diabetes mellitus    insulin dependant   Fibromyalgia    GERD (gastroesophageal reflux disease)    High cholesterol    Hyperlipidemia    Hypertension    Insomnia    Muscle pain    Nasal polyp    Neuromuscular disorder ([place])    diabetic neuropathy   Obesity    Palpitations    Tendonitis of knee, left 04/2020   received steroid injection   Vitamin D deficiency    Vitamin D deficiency     Past Surgical History:  Procedure Laterality Date   BREAST REDUCTION SURGERY     ETHMOIDECTOMY Right 05/16/2020    Procedure: ETHMOIDECTOMY;  Surgeon: Margaretha Sheffield, MD;  Location: [facility] OR[facility];  Service: ENT;  Laterality: Right;   EXCISION ORAL TUMOR N/A 05/16/2020   Procedure: EXCISION OF SOFT PALATE PAPILLOMA;  Surgeon: Margaretha Sheffield, MD;  Location: [facility];  Service: ENT;  Laterality: N/A;   FRONTAL SINUS EXPLORATION Right 05/16/2020   Procedure: FRONTAL SINUS EXPLORATION;  Surgeon: Margaretha Sheffield, MD;  Location: [facility];  Service: ENT;  Laterality: Right;   IMAGE GUIDED SINUS SURGERY N/A 05/16/2020   Procedure: IMAGE GUIDED SINUS SURGERY;  Surgeon: Margaretha Sheffield, MD;  Location: [facility];  Service: ENT;  Laterality: N/A;   MAXILLARY ANTROSTOMY Bilateral 05/16/2020   Procedure: MAXILLARY ANTROSTOMY with tissue removal;  Surgeon: Margaretha Sheffield, MD;  Location: [facility];  Service: ENT;  Laterality: Bilateral;   nasal endoscopic     OOPHORECTOMY  ?   one ovary removed    REDUCTION MAMMAPLASTY Bilateral 2000   removal of ovary     SPHENOIDECTOMY Right 05/16/2020   Procedure: SPHENOIDECTOMY;  Surgeon: Margaretha Sheffield, MD;  Location: [facility];  Service: ENT;  Laterality: Right;    Current Medications: Current Meds  Medication Sig   albuterol (VENTOLIN HFA) 108 (90 Base) MCG/ACT inhaler Inhale 2 puffs into the lungs every 6 (six)  hours as needed for wheezing or shortness of breath.   ALPRAZolam (XANAX XR) 0.5 MG 24 hr tablet Take 1 tablet (0.5 mg total) by mouth daily.   aspirin EC 81 MG tablet Take 81 mg by mouth daily. Swallow whole.   baclofen (LIORESAL) 10 MG tablet Take 1 tablet (10 mg total) by mouth 2 (two) times daily as needed for muscle spasms.   fluticasone (FLONASE) 50 MCG/ACT nasal spray Place 2 sprays into both nostrils daily as needed for allergies or rhinitis.   hydrochlorothiazide (HYDRODIURIL) 25 MG tablet Take 1 tablet (25 mg total) by mouth daily.   Insulin Degludec-Liraglutide (XULTOPHY) 100-3.6 UNIT-MG/ML SOPN Inject 50 Units into the skin daily.   Melatonin 10 MG TABS Take 10 mg by mouth at  bedtime as needed (sleep).    metFORMIN (GLUCOPHAGE-XR) 750 MG 24 hr tablet Take 2 tablets (1,500 mg total) by mouth daily with breakfast.   olmesartan (BENICAR) 40 MG tablet Take 1 tablet (40 mg total) by mouth daily. In place of lisinopril for bp   rosuvastatin (CRESTOR) 5 MG tablet Take 1 tablet (5 mg total) by mouth daily.     Allergies:   Simvastatin, Atorvastatin, Compazine, and Penicillins   Social History   Socioeconomic History   Marital status: Divorced    Spouse name: Not on file   Number of children: 2   Years of education: Not on file   Highest education level: Some college, no degree  Occupational History   Occupation: Designer, television/film set   Tobacco Use   Smoking status: Never   Smokeless tobacco: Never  Vaping Use   Vaping Use: Former  Substance and Sexual Activity   Alcohol use: No    Alcohol/week: 0.0 standard drinks   Drug use: No   Sexual activity: Not Currently    Partners: Male    Birth control/protection: None  Other Topics Concern   Not on file  Social History Narrative   Divorced and he died since   Working at Glass blower/designer.    Social Determinants of Health   Financial Resource Strain: Not on file  Food Insecurity: Not on file  Transportation Needs: Not on file  Physical Activity: Not on file  Stress: Not on file  Social Connections: Not on file     Family History: The patient's family history includes Breast cancer (age of onset: 75) in her maternal aunt; Breast cancer (age of onset: 17) in her mother; Cancer (age of onset: 2) in her mother; Diabetes in her daughter, father, and mother; Gout in her mother; Kidney cancer in her maternal aunt; Kidney disease in her maternal aunt; Stroke in her maternal uncle. There is no history of Prostate cancer or Bladder Cancer.  ROS:   Please see the history of present illness.     All other systems reviewed and are negative.  EKGs/Labs/Other Studies Reviewed:    The following studies were reviewed  today:   EKG:  EKG not ordered today.    Recent Labs: 02/22/2021: ALT 13; TSH 3.00 01/15/2022: BUN 16; Creatinine, Ser 0.68; Hemoglobin 12.8; Platelets 271; Potassium 3.7; Sodium 138  Recent Lipid Panel    Component Value Date/Time   CHOL 186 02/22/2021 1132   CHOL 217 (H) 06/19/2015 1150   TRIG 71 02/22/2021 1132   HDL 60 02/22/2021 1132   HDL 68 06/19/2015 1150   CHOLHDL 3.1 02/22/2021 1132   VLDL 29 06/27/2016 1101   LDLCALC 110 (H) 02/22/2021 1132     Risk Assessment/Calculations:  Physical Exam:    VS:  BP (!) 140/100 (BP Location: Right Arm, Patient Position: Sitting, Cuff Size: Large)    Pulse 82    Ht 5\' 4"  (1.626 m)    Wt 270 lb (122.5 kg)    SpO2 98%    BMI 46.35 kg/m     Wt Readings from Last 3 Encounters:  01/23/22 270 lb (122.5 kg)  01/15/22 279 lb 15.8 oz (127 kg)  11/18/21 280 lb (127 kg)     GEN:  Well nourished, well developed in no acute distress HEENT: Normal NECK: No JVD; No carotid bruits LYMPHATICS: No lymphadenopathy CARDIAC: RRR, no murmurs, rubs, gallops RESPIRATORY:  Clear to auscultation without rales, wheezing or rhonchi  ABDOMEN: Soft, non-tender, non-distended MUSCULOSKELETAL:  No edema; No deformity  SKIN: Warm and dry NEUROLOGIC:  Alert and oriented x 3 PSYCHIATRIC:  Normal affect   ASSESSMENT:    1. Precordial pain   2. Primary hypertension   3. Pure hypercholesterolemia   4. Morbid obesity ([place])    PLAN:    In order of problems listed above:  Chest pain, symptoms appear atypical, reproducible with palpation of left chest suggesting musculoskeletal etiology.  Due to risk factors, will get echo to evaluate overall cardiac structure and function.  Stress test not indicated at this point. Hypertension, BP elevated.  Continue Benicar, start HCTZ.  Low salt diet advised.  Check BP frequently at home and keep a log. Hyperlipidemia, several statin allergies including Lipitor, simvastatin.  Tolerating low-dose Crestor,  continue. Morbid obesity, low-calorie diet, weight loss advised.  Follow-up in 6 weeks      Medication Adjustments/Labs and Tests Ordered: Current medicines are reviewed at length with the patient today.  Concerns regarding medicines are outlined above.  Orders Placed This Encounter  Procedures   ECHOCARDIOGRAM COMPLETE   Meds ordered this encounter  Medications   hydrochlorothiazide (HYDRODIURIL) 25 MG tablet    Sig: Take 1 tablet (25 mg total) by mouth daily.    Dispense:  30 tablet    Refill:  3    Patient Instructions  Medication Instructions:   Your physician has recommended you make the following change in your medication:   START taking Hydrochlorothiazide 25 MG once a day.  *If you need a refill on your cardiac medications before your next appointment, please call your pharmacy*   Lab Work: None Ordered If you have labs (blood work) drawn today and your tests are completely normal, you will receive your results only by: [place] (if you have MyChart) OR A paper copy in the mail If you have any lab test that is abnormal or we need to change your treatment, we will call you to review the results.   Testing/Procedures:  Your physician has requested that you have an echocardiogram. Echocardiography is a painless test that uses sound waves to create images of your heart. It provides your doctor with information about the size and shape of your heart and how well your hearts chambers and valves are working. This procedure takes approximately one hour. There are no restrictions for this procedure.    Follow-Up: At [facility], you and your health needs are our priority.  As part of our continuing mission to provide you with exceptional heart care, we have created designated Provider Care Teams.  These Care Teams include your primary Cardiologist (physician) and Advanced Practice Providers (APPs -  Physician Assistants and Nurse Practitioners) who all work  together to provide you with the care  you need, when you need it.  We recommend signing up for the patient portal called "MyChart".  Sign up information is provided on this After Visit Summary.  MyChart is used to connect with patients for Virtual Visits (Telemedicine).  Patients are able to view lab/test results, encounter notes, upcoming appointments, etc.  Non-urgent messages can be sent to your provider as well.   To learn more about what you can do with MyChart, go to [email or web address].    Your next appointment:   6 week(s)  The format for your next appointment:   In Person  Provider:   You may see Kate Sable, MD or one of the following Advanced Practice Providers on your designated Care Team:   Murray Hodgkins, NP Christell Faith, PA-C Cadence Kathlen Mody, [place]    Other Instructions DASH Eating Plan DASH stands for Dietary Approaches to Stop Hypertension. The DASH eating plan is a healthy eating plan that has been shown to: Reduce high blood pressure (hypertension). Reduce your risk for type 2 diabetes, heart disease, and stroke. Help with weight loss. What are tips for following this plan? Reading food labels Check food labels for the amount of salt (sodium) per serving. Choose foods with less than 5 percent of the Daily Value of sodium. Generally, foods with less than 300 milligrams (mg) of sodium per serving fit into this eating plan. To find whole grains, look for the word "whole" as the first word in the ingredient list. Shopping Buy products labeled as "low-sodium" or "no salt added." Buy fresh foods. Avoid canned foods and pre-made or frozen meals. Cooking Avoid adding salt when cooking. Use salt-free seasonings or herbs instead of table salt or sea salt. Check with your health care provider or pharmacist before using salt substitutes. Do not fry foods. Cook foods using healthy methods such as baking, boiling, grilling, roasting, and broiling instead. Cook with  heart-healthy oils, such as olive, canola, avocado, soybean, or sunflower oil. Meal planning  Eat a balanced diet that includes: 4 or more servings of fruits and 4 or more servings of vegetables each day. Try to fill one-half of your plate with fruits and vegetables. 6-8 servings of whole grains each day. Less than 6 oz (170 g) of lean meat, poultry, or fish each day. A 3-oz (85-g) serving of meat is about the same size as a deck of cards. One egg equals 1 oz (28 g). 2-3 servings of low-fat dairy each day. One serving is 1 cup (237 mL). 1 serving of nuts, seeds, or beans 5 times each week. 2-3 servings of heart-healthy fats. Healthy fats called omega-3 fatty acids are found in foods such as walnuts, flaxseeds, fortified milks, and eggs. These fats are also found in cold-water fish, such as sardines, salmon, and mackerel. Limit how much you eat of: Canned or prepackaged foods. Food that is high in trans fat, such as some fried foods. Food that is high in saturated fat, such as fatty meat. Desserts and other sweets, sugary drinks, and other foods with added sugar. Full-fat dairy products. Do not salt foods before eating. Do not eat more than 4 egg yolks a week. Try to eat at least 2 vegetarian meals a week. Eat more home-cooked food and less restaurant, buffet, and fast food. Lifestyle When eating at a restaurant, ask that your food be prepared with less salt or no salt, if possible. If you drink alcohol: Limit how much you use to: 0-1 drink a day for women who are not  pregnant. 0-2 drinks a day for men. Be aware of how much alcohol is in your drink. In the [place] one drink equals one 12 oz bottle of beer (355 mL), one 5 oz glass of wine (148 mL), or one 1 oz glass of hard liquor (44 mL). General information Avoid eating more than 2,300 mg of salt a day. If you have hypertension, you may need to reduce your sodium intake to 1,500 mg a day. Work with your health care provider to maintain a  healthy body weight or to lose weight. Ask what an ideal weight is for you. Get at least 30 minutes of exercise that causes your heart to beat faster (aerobic exercise) most days of the week. Activities may include walking, swimming, or biking. Work with your health care provider or dietitian to adjust your eating plan to your individual calorie needs. What foods should I eat? Fruits All fresh, dried, or frozen fruit. Canned fruit in natural juice (without added sugar). Vegetables Fresh or frozen vegetables (raw, steamed, roasted, or grilled). Low-sodium or reduced-sodium tomato and vegetable juice. Low-sodium or reduced-sodium tomato sauce and tomato paste. Low-sodium or reduced-sodium canned vegetables. Grains Whole-grain or whole-wheat bread. Whole-grain or whole-wheat pasta. Brown rice. [place]. Bulgur. Whole-grain and low-sodium cereals. Pita bread. Low-fat, low-sodium crackers. Whole-wheat flour tortillas. Meats and other proteins Skinless chicken or [place]. Ground chicken or [place]. Pork with fat trimmed off. Fish and seafood. Egg whites. Dried beans, peas, or lentils. Unsalted nuts, nut butters, and seeds. Unsalted canned beans. Lean cuts of beef with fat trimmed off. Low-sodium, lean precooked or cured meat, such as sausages or meat loaves. Dairy Low-fat (1%) or fat-free (skim) milk. Reduced-fat, low-fat, or fat-free cheeses. Nonfat, low-sodium ricotta or cottage cheese. Low-fat or nonfat yogurt. Low-fat, low-sodium cheese. Fats and oils Soft margarine without trans fats. Vegetable oil. Reduced-fat, low-fat, or light mayonnaise and salad dressings (reduced-sodium). Canola, safflower, olive, avocado, soybean, and sunflower oils. Avocado. Seasonings and condiments Herbs. Spices. Seasoning mixes without salt. Other foods Unsalted popcorn and pretzels. Fat-free sweets. The items listed above may not be a complete list of foods and beverages you can eat. Contact a dietitian for more  information. What foods should I avoid? Fruits Canned fruit in a light or heavy syrup. Fried fruit. Fruit in cream or butter sauce. Vegetables Creamed or fried vegetables. Vegetables in a cheese sauce. Regular canned vegetables (not low-sodium or reduced-sodium). Regular canned tomato sauce and paste (not low-sodium or reduced-sodium). Regular tomato and vegetable juice (not low-sodium or reduced-sodium). Angie Fava. Olives. Grains Baked goods made with fat, such as croissants, muffins, or some breads. Dry pasta or rice meal packs. Meats and other proteins Fatty cuts of meat. Ribs. Fried meat. Berniece Salines. Bologna, salami, and other precooked or cured meats, such as sausages or meat loaves. Fat from the back of a pig (fatback). Bratwurst. Salted nuts and seeds. Canned beans with added salt. Canned or smoked fish. Whole eggs or egg yolks. Chicken or [place] with skin. Dairy Whole or 2% milk, cream, and half-and-half. Whole or full-fat cream cheese. Whole-fat or sweetened yogurt. Full-fat cheese. Nondairy creamers. Whipped toppings. Processed cheese and cheese spreads. Fats and oils Butter. Stick margarine. Lard. Shortening. Ghee. Bacon fat. Tropical oils, such as coconut, palm kernel, or palm oil. Seasonings and condiments Onion salt, garlic salt, seasoned salt, table salt, and sea salt. Worcestershire sauce. Tartar sauce. Barbecue sauce. Teriyaki sauce. Soy sauce, including reduced-sodium. Steak sauce. Canned and packaged gravies. Fish sauce. Oyster sauce. Cocktail sauce. Store-bought  horseradish. Ketchup. Mustard. Meat flavorings and tenderizers. Bouillon cubes. Hot sauces. Pre-made or packaged marinades. Pre-made or packaged taco seasonings. Relishes. Regular salad dressings. Other foods Salted popcorn and pretzels. The items listed above may not be a complete list of foods and beverages you should avoid. Contact a dietitian for more information. Where to find more information National Heart, Lung, and  Blood Institute: https://wilson-eaton.com/ American Heart Association: www.heart.org Academy of Nutrition and Dietetics: www.eatright.[place]: www.kidney.org Summary The DASH eating plan is a healthy eating plan that has been shown to reduce high blood pressure (hypertension). It may also reduce your risk for type 2 diabetes, heart disease, and stroke. When on the DASH eating plan, aim to eat more fresh fruits and vegetables, whole grains, lean proteins, low-fat dairy, and heart-healthy fats. With the DASH eating plan, you should limit salt (sodium) intake to 2,300 mg a day. If you have hypertension, you may need to reduce your sodium intake to 1,500 mg a day. Work with your health care provider or dietitian to adjust your eating plan to your individual calorie needs. This information is not intended to replace advice given to you by your health care provider. Make sure you discuss any questions you have with your health care provider. Document Revised: 11/11/2019 Document Reviewed: 11/11/2019 Elsevier Patient Education  2022 Hayden, Kate Sable, MD  01/23/2022 12:08 PM    [facility]

## 2022-01-23 NOTE — Patient Instructions (Addendum)
Medication Instructions:   Your physician has recommended you make the following change in your medication:   START taking Hydrochlorothiazide 25 MG once a day.  *If you need a refill on your cardiac medications before your next appointment, please call your pharmacy*   Lab Work: None Ordered If you have labs (blood work) drawn today and your tests are completely normal, you will receive your results only by: [place] (if you have MyChart) OR A paper copy in the mail If you have any lab test that is abnormal or we need to change your treatment, we will call you to review the results.   Testing/Procedures:  Your physician has requested that you have an echocardiogram. Echocardiography is a painless test that uses sound waves to create images of your heart. It provides your doctor with information about the size and shape of your heart and how well your hearts chambers and valves are working. This procedure takes approximately one hour. There are no restrictions for this procedure.    Follow-Up: At [facility], you and your health needs are our priority.  As part of our continuing mission to provide you with exceptional heart care, we have created designated Provider Care Teams.  These Care Teams include your primary Cardiologist (physician) and Advanced Practice Providers (APPs -  Physician Assistants and Nurse Practitioners) who all work together to provide you with the care you need, when you need it.  We recommend signing up for the patient portal called "MyChart".  Sign up information is provided on this After Visit Summary.  MyChart is used to connect with patients for Virtual Visits (Telemedicine).  Patients are able to view lab/test results, encounter notes, upcoming appointments, etc.  Non-urgent messages can be sent to your provider as well.   To learn more about what you can do with MyChart, go to [email or web address].    Your next appointment:   6 week(s)  The  format for your next appointment:   In Person  Provider:   You may see Kate Sable, MD or one of the following Advanced Practice Providers on your designated Care Team:   Murray Hodgkins, NP Christell Faith, PA-C Cadence Kathlen Mody, [place]    Other Instructions DASH Eating Plan DASH stands for Dietary Approaches to Stop Hypertension. The DASH eating plan is a healthy eating plan that has been shown to: Reduce high blood pressure (hypertension). Reduce your risk for type 2 diabetes, heart disease, and stroke. Help with weight loss. What are tips for following this plan? Reading food labels Check food labels for the amount of salt (sodium) per serving. Choose foods with less than 5 percent of the Daily Value of sodium. Generally, foods with less than 300 milligrams (mg) of sodium per serving fit into this eating plan. To find whole grains, look for the word "whole" as the first word in the ingredient list. Shopping Buy products labeled as "low-sodium" or "no salt added." Buy fresh foods. Avoid canned foods and pre-made or frozen meals. Cooking Avoid adding salt when cooking. Use salt-free seasonings or herbs instead of table salt or sea salt. Check with your health care provider or pharmacist before using salt substitutes. Do not fry foods. Cook foods using healthy methods such as baking, boiling, grilling, roasting, and broiling instead. Cook with heart-healthy oils, such as olive, canola, avocado, soybean, or sunflower oil. Meal planning  Eat a balanced diet that includes: 4 or more servings of fruits and 4 or more servings of vegetables each day. Try  to fill one-half of your plate with fruits and vegetables. 6-8 servings of whole grains each day. Less than 6 oz (170 g) of lean meat, poultry, or fish each day. A 3-oz (85-g) serving of meat is about the same size as a deck of cards. One egg equals 1 oz (28 g). 2-3 servings of low-fat dairy each day. One serving is 1 cup (237 mL). 1  serving of nuts, seeds, or beans 5 times each week. 2-3 servings of heart-healthy fats. Healthy fats called omega-3 fatty acids are found in foods such as walnuts, flaxseeds, fortified milks, and eggs. These fats are also found in cold-water fish, such as sardines, salmon, and mackerel. Limit how much you eat of: Canned or prepackaged foods. Food that is high in trans fat, such as some fried foods. Food that is high in saturated fat, such as fatty meat. Desserts and other sweets, sugary drinks, and other foods with added sugar. Full-fat dairy products. Do not salt foods before eating. Do not eat more than 4 egg yolks a week. Try to eat at least 2 vegetarian meals a week. Eat more home-cooked food and less restaurant, buffet, and fast food. Lifestyle When eating at a restaurant, ask that your food be prepared with less salt or no salt, if possible. If you drink alcohol: Limit how much you use to: 0-1 drink a day for women who are not pregnant. 0-2 drinks a day for men. Be aware of how much alcohol is in your drink. In the [place] one drink equals one 12 oz bottle of beer (355 mL), one 5 oz glass of wine (148 mL), or one 1 oz glass of hard liquor (44 mL). General information Avoid eating more than 2,300 mg of salt a day. If you have hypertension, you may need to reduce your sodium intake to 1,500 mg a day. Work with your health care provider to maintain a healthy body weight or to lose weight. Ask what an ideal weight is for you. Get at least 30 minutes of exercise that causes your heart to beat faster (aerobic exercise) most days of the week. Activities may include walking, swimming, or biking. Work with your health care provider or dietitian to adjust your eating plan to your individual calorie needs. What foods should I eat? Fruits All fresh, dried, or frozen fruit. Canned fruit in natural juice (without added sugar). Vegetables Fresh or frozen vegetables (raw, steamed, roasted, or  grilled). Low-sodium or reduced-sodium tomato and vegetable juice. Low-sodium or reduced-sodium tomato sauce and tomato paste. Low-sodium or reduced-sodium canned vegetables. Grains Whole-grain or whole-wheat bread. Whole-grain or whole-wheat pasta. Brown rice. [place]. Bulgur. Whole-grain and low-sodium cereals. Pita bread. Low-fat, low-sodium crackers. Whole-wheat flour tortillas. Meats and other proteins Skinless chicken or [place]. Ground chicken or [place]. Pork with fat trimmed off. Fish and seafood. Egg whites. Dried beans, peas, or lentils. Unsalted nuts, nut butters, and seeds. Unsalted canned beans. Lean cuts of beef with fat trimmed off. Low-sodium, lean precooked or cured meat, such as sausages or meat loaves. Dairy Low-fat (1%) or fat-free (skim) milk. Reduced-fat, low-fat, or fat-free cheeses. Nonfat, low-sodium ricotta or cottage cheese. Low-fat or nonfat yogurt. Low-fat, low-sodium cheese. Fats and oils Soft margarine without trans fats. Vegetable oil. Reduced-fat, low-fat, or light mayonnaise and salad dressings (reduced-sodium). Canola, safflower, olive, avocado, soybean, and sunflower oils. Avocado. Seasonings and condiments Herbs. Spices. Seasoning mixes without salt. Other foods Unsalted popcorn and pretzels. Fat-free sweets. The items listed above may not be a  complete list of foods and beverages you can eat. Contact a dietitian for more information. What foods should I avoid? Fruits Canned fruit in a light or heavy syrup. Fried fruit. Fruit in cream or butter sauce. Vegetables Creamed or fried vegetables. Vegetables in a cheese sauce. Regular canned vegetables (not low-sodium or reduced-sodium). Regular canned tomato sauce and paste (not low-sodium or reduced-sodium). Regular tomato and vegetable juice (not low-sodium or reduced-sodium). Angie Fava. Olives. Grains Baked goods made with fat, such as croissants, muffins, or some breads. Dry pasta or rice meal packs. Meats  and other proteins Fatty cuts of meat. Ribs. Fried meat. Berniece Salines. Bologna, salami, and other precooked or cured meats, such as sausages or meat loaves. Fat from the back of a pig (fatback). Bratwurst. Salted nuts and seeds. Canned beans with added salt. Canned or smoked fish. Whole eggs or egg yolks. Chicken or [place] with skin. Dairy Whole or 2% milk, cream, and half-and-half. Whole or full-fat cream cheese. Whole-fat or sweetened yogurt. Full-fat cheese. Nondairy creamers. Whipped toppings. Processed cheese and cheese spreads. Fats and oils Butter. Stick margarine. Lard. Shortening. Ghee. Bacon fat. Tropical oils, such as coconut, palm kernel, or palm oil. Seasonings and condiments Onion salt, garlic salt, seasoned salt, table salt, and sea salt. Worcestershire sauce. Tartar sauce. Barbecue sauce. Teriyaki sauce. Soy sauce, including reduced-sodium. Steak sauce. Canned and packaged gravies. Fish sauce. Oyster sauce. Cocktail sauce. Store-bought horseradish. Ketchup. Mustard. Meat flavorings and tenderizers. Bouillon cubes. Hot sauces. Pre-made or packaged marinades. Pre-made or packaged taco seasonings. Relishes. Regular salad dressings. Other foods Salted popcorn and pretzels. The items listed above may not be a complete list of foods and beverages you should avoid. Contact a dietitian for more information. Where to find more information National Heart, Lung, and Blood Institute: https://wilson-eaton.com/ American Heart Association: www.heart.org Academy of Nutrition and Dietetics: www.eatright.[place]: www.kidney.org Summary The DASH eating plan is a healthy eating plan that has been shown to reduce high blood pressure (hypertension). It may also reduce your risk for type 2 diabetes, heart disease, and stroke. When on the DASH eating plan, aim to eat more fresh fruits and vegetables, whole grains, lean proteins, low-fat dairy, and heart-healthy fats. With the DASH eating plan, you  should limit salt (sodium) intake to 2,300 mg a day. If you have hypertension, you may need to reduce your sodium intake to 1,500 mg a day. Work with your health care provider or dietitian to adjust your eating plan to your individual calorie needs. This information is not intended to replace advice given to you by your health care provider. Make sure you discuss any questions you have with your health care provider. Document Revised: 11/11/2019 Document Reviewed: 11/11/2019 Elsevier Patient Education  2022 [employer].

## 2022-01-24 ENCOUNTER — Encounter: Payer: Self-pay | Admitting: Cardiology

## 2022-01-24 ENCOUNTER — Telehealth: Payer: Self-pay

## 2022-01-24 ENCOUNTER — Other Ambulatory Visit: Payer: Self-pay | Admitting: Family Medicine

## 2022-01-24 MED ORDER — METAXALONE 800 MG PO TABS: 800.0000 mg | ORAL_TABLET | Freq: Three times a day (TID) | ORAL | 0 refills | Status: DC | PRN

## 2022-01-24 NOTE — Telephone Encounter (Signed)
Left voicemail to let patient know her PA was approved for the metaxalone and would be available for pick up at her pharmacy at her convenience.

## 2022-01-27 ENCOUNTER — Telehealth: Payer: Self-pay

## 2022-01-27 MED ORDER — AMLODIPINE BESYLATE 5 MG PO TABS: 5.0000 mg | ORAL_TABLET | Freq: Every day | ORAL | 3 refills | Status: DC

## 2022-01-27 NOTE — Telephone Encounter (Signed)
Called patient with recommendations in regards to her MyChart message from 01/24/22.  Kate Sable, MD  You 3 days ago   Okay to stop HCTZ due to possibility of frequent urination, start Norvasc 5 mg daily instead.  Continue Benicar as prescribed.   If patient is worried about frequent urination, lisinopril-HCTZ(contains HCTZ and will likely make her urinate more), as such we will not be the best option.   Thank you  BA     Patient verbalized understanding and agreed with plan.

## 2022-02-18 NOTE — Progress Notes (Signed)
Name: Lindsay Mcdonald   MRN: [id number]    DOB: Oct 11, 1961   Date:02/19/2022       Progress Note  Subjective  Chief Complaint  Follow up  HPI  Mild Major Depression/insomnia: she is afraid of taking medications, explained again that SSRI is safer than BZD.   She has been on alprazolam XR and is afraid to stop taking medication. Phq 9 is still above goal   Denies suicidal thoughts or ideation. She is now living with her daughter due to financial difficulties and sleeping in a sofa .She is now seeing a Social worker and was advised to change jobs.    OA: seeing Dr. Candelaria Stagers and had steroid injections on both knees ,no effusion or redness, she has some pain on right popliteal fossa, she has a bakers cysts, knees are stiff intermittently but doing better, also had steroid injections on both shoulders. Stable   FMS: she has a long history of feeling aching and tender worse on trunk and upper extremities. Pain on shoulders, arms, stiff on her hips . Feels a little stiff when first gets up, she has some mental fogginess, she has tried Lyrica and Duloxetine but stopped on her own. She is taking Tylenol arthritis , she stopped taking Ibuprofen since Dec 2022    DMII:glucose lately has been elevated due to prednisone  A1C went from 6.6 % to 7.9% to 8.1% ,6.7 %, 7 % 6.6 % , 7.1 % ,8.1 % to 7.1 % , she has been compliant with Xultophy ( sometimes skips by accident)  and also taking Metformin 1500 mg daily  Advised to take both Metformin 1500 mg daily .  No polyphagia, polydipsia or polyuria. She states occasional has tingling on her toes .She also has associated HTN, dyslipidemia, neuropathy and obesity.    OSA: she has not been back for titration yet, advised her to contact them back for appointment   HTN: she is now on Benicar 40 mg, no chest pain or palpation . BP is at goal, no side effects    Hyperlipidemia: she was on Zetia but also stopped due to  muscle pain, she was not able to take Crestor daily but  able to tolerate taking it every other day, muscle aches not present and fatigue improved when taking less days a week. She is currently on Crestor 5 mg three times a week   Morbid Obesity: BMI is still above goal but lost weight since last visit.  She states did not follow a healthy diet during the holidays.    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 Pain was sharp and shooting down left elbow, it was affecting her sleep, she saw Dr. Rudene Christians and he recommend evaluation by neurosurgeon and PT, however in the mean time she saw Dr. Candelaria Stagers since and steroid injections on shoulder improved symptoms She is now on skelaxin and seems to be helping    Patient Active Problem List   Diagnosis Date Noted   Type 2 diabetes mellitus ([place]) 05/21/2021   Hypertension associated with diabetes ([place]) 08/31/2020   History of marijuana use 03/30/2019   Fibromyalgia 12/31/2018   Venous vascular malformations 03/27/2016   Non compliance w medication regimen 09/26/2015   Seasonal allergic rhinitis 09/26/2015   Muscle spasms of neck 06/19/2015   Insomnia 06/19/2015   Primary osteoarthritis of right knee 06/17/2015   Depression, major, recurrent, mild ([place]) 06/17/2015  Hyperlipidemia 06/17/2015   Benign hypertension 06/17/2015   Morbid obesity with BMI of 40.0-44.9, adult ([place]) 06/17/2015   Vitamin D deficiency 06/17/2015    Past Surgical History:  Procedure Laterality Date   BREAST REDUCTION SURGERY     ETHMOIDECTOMY Right 05/16/2020   Procedure: ETHMOIDECTOMY;  Surgeon: Margaretha Sheffield, MD;  Location: [facility] ORS;  Service: ENT;  Laterality: Right;   EXCISION ORAL TUMOR N/A 05/16/2020   Procedure: EXCISION OF SOFT PALATE PAPILLOMA;  Surgeon: Margaretha Sheffield, MD;  Location: [facility];  Service: ENT;  Laterality: N/A;   FRONTAL SINUS EXPLORATION Right 05/16/2020   Procedure: FRONTAL SINUS EXPLORATION;  Surgeon: Margaretha Sheffield, MD;  Location: [facility];  Service: ENT;  Laterality: Right;   IMAGE GUIDED SINUS SURGERY N/A 05/16/2020   Procedure: IMAGE GUIDED SINUS SURGERY;  Surgeon: Margaretha Sheffield, MD;  Location: [facility];  Service: ENT;  Laterality: N/A;   MAXILLARY ANTROSTOMY Bilateral 05/16/2020   Procedure: MAXILLARY ANTROSTOMY with tissue removal;  Surgeon: Margaretha Sheffield, MD;  Location: [facility];  Service: ENT;  Laterality: Bilateral;   nasal endoscopic     OOPHORECTOMY  ?   one ovary removed    REDUCTION MAMMAPLASTY Bilateral 2000   removal of ovary     SPHENOIDECTOMY Right 05/16/2020   Procedure: SPHENOIDECTOMY;  Surgeon: Margaretha Sheffield, MD;  Location: [facility];  Service: ENT;  Laterality: Right;    Family History  Problem Relation Age of Onset   Diabetes Mother    Cancer Mother 79       Breast   Breast cancer Mother 69   Gout Mother    Diabetes Father    Diabetes Daughter        Oldest Daughter   Breast cancer Maternal Aunt 55   Kidney cancer Maternal Aunt    Kidney disease Maternal Aunt    Stroke Maternal Uncle    Prostate cancer Neg Hx    Bladder Cancer Neg Hx     Social History   Tobacco Use   Smoking status: Never   Smokeless tobacco: Never  Substance Use Topics   Alcohol use: No    Alcohol/week: 0.0 standard drinks     Current Outpatient Medications:    albuterol (VENTOLIN HFA) 108 (90 Base) MCG/ACT inhaler, Inhale 2 puffs into the lungs every 6 (six) hours as needed for wheezing or shortness of breath., Disp: 8 g, Rfl: 2   ALPRAZolam (XANAX XR) 0.5 MG 24 hr tablet, Take 1 tablet (0.5 mg total) by mouth daily., Disp: 30 tablet, Rfl: 2   amLODipine (NORVASC) 5 MG tablet, Take 1 tablet (5 mg total) by mouth daily., Disp: 30 tablet, Rfl: 3   aspirin EC 81 MG tablet, Take 81 mg by mouth daily. Swallow whole., Disp: , Rfl:    fluticasone (FLONASE) 50 MCG/ACT nasal spray, Place 2 sprays into both nostrils daily as needed for allergies or rhinitis., Disp: , Rfl:    Insulin  Degludec-Liraglutide (XULTOPHY) 100-3.6 UNIT-MG/ML SOPN, Inject 50 Units into the skin daily., Disp: 45 mL, Rfl: 1   Melatonin 10 MG TABS, Take 10 mg by mouth at bedtime as needed (sleep). , Disp: , Rfl:    metaxalone (SKELAXIN) 800 MG tablet, Take 1 tablet (800 mg total) by mouth 3 (three) times daily as needed for muscle spasms., Disp: 90 tablet, Rfl: 0   metFORMIN (GLUCOPHAGE-XR) 750 MG 24 hr tablet, Take 2 tablets (1,500 mg total) by mouth daily with breakfast., Disp: 180 tablet, Rfl: 1   olmesartan (BENICAR)  40 MG tablet, Take 1 tablet (40 mg total) by mouth daily. In place of lisinopril for bp, Disp: 90 tablet, Rfl: 1   rosuvastatin (CRESTOR) 5 MG tablet, Take 1 tablet (5 mg total) by mouth daily., Disp: 90 tablet, Rfl: 0  Allergies  Allergen Reactions   Simvastatin     Muscle cramps    Atorvastatin Other (See Comments)    muscle cramps   Compazine Other (See Comments)    Twitching and spasms of neck muscles   Penicillins Rash    Reaction under 25, patient unsure that she still has an allergy    I personally reviewed active problem list, medication list, allergies, family history with the patient/caregiver today.   ROS  Constitutional: Negative for fever , positive for weight change.  Respiratory: Negative for cough and shortness of breath.   Cardiovascular: Negative for chest pain or palpitations.  Gastrointestinal: Negative for abdominal pain, no bowel changes.  Musculoskeletal: Negative for gait problem or joint swelling.  Skin: Negative for rash.  Neurological: Negative for dizziness or headache.  No other specific complaints in a complete review of systems (except as listed in HPI above).    Objective  Vitals:   02/19/22 0954  BP: 136/82  Pulse: 98  Resp: 16  Temp: 97.8 F (36.6 C)  TempSrc: Oral  SpO2: 100%  Weight: 268 lb 12.8 oz (121.9 kg)  Height: 5\' 4"  (1.626 m)    Body mass index is 46.14 kg/m.  Physical Exam  Constitutional: Patient appears  well-developed and well-nourished. Obese  No distress.  HEENT: head atraumatic, normocephalic, pupils equal and reactive to light, neck supple Cardiovascular: Normal rate, regular rhythm and normal heart sounds.  No murmur heard. No BLE edema. Pulmonary/Chest: Effort normal and breath sounds normal. No respiratory distress. Abdominal: Soft.  There is no tenderness. Psychiatric: Patient has a normal mood and affect. behavior is normal. Judgment and thought content normal.   Recent Results (from the past 2160 hour(s))  Basic metabolic panel     Status: Abnormal   Collection Time: 01/15/22  5:39 PM  Result Value Ref Range   Sodium 138 135 - 145 mmol/L   Potassium 3.7 3.5 - 5.1 mmol/L   Chloride 101 98 - 111 mmol/L   CO2 27 22 - 32 mmol/L   Glucose, Bld 142 (H) 70 - 99 mg/dL    Comment: Glucose reference range applies only to samples taken after fasting for at least 8 hours.   BUN 16 6 - 20 mg/dL   Creatinine, Ser 0.68 0.44 - 1.00 mg/dL   Calcium 8.8 (L) 8.9 - 10.3 mg/dL   GFR, Estimated >60 >60 mL/min    Comment: (NOTE) Calculated using the CKD-EPI Creatinine Equation (2021)    Anion gap 10 5 - 15    Comment: Performed at [facility], [address] [place], [place] [address]  CBC     Status: Abnormal   Collection Time: 01/15/22  5:39 PM  Result Value Ref Range   WBC 8.7 4.0 - 10.5 K/uL   RBC 4.96 3.87 - 5.11 MIL/uL   Hemoglobin 12.8 12.0 - 15.0 g/dL   HCT 40.8 36.0 - 46.0 %   MCV 82.3 80.0 - 100.0 fL   MCH 25.8 (L) 26.0 - 34.0 pg   MCHC 31.4 30.0 - 36.0 g/dL   RDW 13.2 11.5 - 15.5 %   Platelets 271 150 - 400 K/uL   nRBC 0.0 0.0 - 0.2 %    Comment: Performed  at [facility], [place], [place] [address]  Troponin I (High Sensitivity)     Status: None   Collection Time: 01/15/22  5:39 PM  Result Value Ref Range   Troponin I (High Sensitivity) 5 <18 ng/L    Comment: (NOTE) Elevated high sensitivity troponin I (hsTnI) values and significant   changes across serial measurements may suggest ACS but many other  chronic and acute conditions are known to elevate hsTnI results.  Refer to the "Links" section for chest pain algorithms and additional  guidance. Performed at [facility], [place], [place] [address]   Troponin I (High Sensitivity)     Status: None   Collection Time: 01/15/22  7:39 PM  Result Value Ref Range   Troponin I (High Sensitivity) 4 <18 ng/L    Comment: (NOTE) Elevated high sensitivity troponin I (hsTnI) values and significant  changes across serial measurements may suggest ACS but many other  chronic and acute conditions are known to elevate hsTnI results.  Refer to the "Links" section for chest pain algorithms and additional  guidance. Performed at [facility], [address] [place], [place] [address]     PHQ2/9: Depression screen [facility] 2/9 02/19/2022 11/18/2021 09/17/2021 08/21/2021 05/21/2021  Decreased Interest 1 1 1 2 1   Down, Depressed, Hopeless 1 1 1 1 1   PHQ - 2 Score 2 2 2 3 2   Altered sleeping 1 0 0 0 0  Tired, decreased energy 1 0 0 1 1  Change in appetite 1 0 0 0 0  Feeling bad or failure about yourself  1 0 0 0 0  Trouble concentrating 0 0 0 0 0  Moving slowly or fidgety/restless 0 0 0 0 0  Suicidal thoughts 0 0 0 0 0  PHQ-9 Score 6 2 2 4 3   Difficult doing work/chores Somewhat difficult Somewhat difficult Not difficult at all - Not difficult at all  Some recent data might be hidden    phq 9 is positive   Fall Risk: Fall Risk  02/19/2022 11/18/2021 09/17/2021 08/21/2021 05/21/2021  Falls in the past year? 0 0 0 0 0  Number falls in past yr: - 0 0 0 0  Injury with Fall? - 0 0 0 0  Risk for fall due to : - No Fall Risks - No Fall Risks -  Follow up Falls prevention discussed Falls prevention discussed Falls evaluation completed Falls prevention discussed -      Functional Status Survey: Is the patient deaf or have difficulty hearing?: No Does the  patient have difficulty seeing, even when wearing glasses/contacts?: Yes Does the patient have difficulty concentrating, remembering, or making decisions?: Yes Does the patient have difficulty walking or climbing stairs?: No Does the patient have difficulty dressing or bathing?: No Does the patient have difficulty doing errands alone such as visiting a doctor's office or shopping?: No    Assessment & Plan  1. Diabetic peripheral neuropathy associated with type 2 diabetes mellitus (HCC)  - HgB A1c - Microalbumin / creatinine urine ratio - Insulin Degludec-Liraglutide (XULTOPHY) 100-3.6 UNIT-MG/ML SOPN; Inject 50 Units into the skin daily.  Dispense: 15 mL; Refill: 2  2. Dyslipidemia associated with type 2 diabetes mellitus (HCC)  - Lipid panel - COMPLETE METABOLIC PANEL WITH GFR  3. Colon cancer screening  - Fecal Globin By Immunochemistry  4. Hypertension associated with diabetes ([place])  - olmesartan (BENICAR) 40 MG tablet; Take 1 tablet (40 mg total) by mouth  daily. In place of lisinopril for bp  Dispense: 90 tablet; Refill: 1  5. Breast cancer screening by mammogram  - MM 3D SCREEN BREAST BILATERAL; Future  6. GAD (generalized anxiety disorder)  - ALPRAZolam (XANAX XR) 0.5 MG 24 hr tablet; Take 1 tablet (0.5 mg total) by mouth daily.  Dispense: 30 tablet; Refill: 2  7. Primary osteoarthritis of both knees   8. Fibromyalgia   9. Depression, major, recurrent, mild ([place])   10. Benign hypertension

## 2022-02-19 ENCOUNTER — Ambulatory Visit (INDEPENDENT_AMBULATORY_CARE_PROVIDER_SITE_OTHER): Payer: BC Managed Care – PPO

## 2022-02-19 ENCOUNTER — Ambulatory Visit (INDEPENDENT_AMBULATORY_CARE_PROVIDER_SITE_OTHER): Payer: BC Managed Care – PPO | Admitting: Family Medicine

## 2022-02-19 ENCOUNTER — Encounter: Payer: Self-pay | Admitting: Family Medicine

## 2022-02-19 ENCOUNTER — Other Ambulatory Visit: Payer: Self-pay

## 2022-02-19 VITALS — BP 136/82 | HR 98 | Temp 97.8°F | Resp 16 | Ht 64.0 in | Wt 268.8 lb

## 2022-02-19 DIAGNOSIS — I152 Hypertension secondary to endocrine disorders: Secondary | ICD-10-CM

## 2022-02-19 DIAGNOSIS — F330 Major depressive disorder, recurrent, mild: Secondary | ICD-10-CM

## 2022-02-19 DIAGNOSIS — E785 Hyperlipidemia, unspecified: Secondary | ICD-10-CM

## 2022-02-19 DIAGNOSIS — M797 Fibromyalgia: Secondary | ICD-10-CM

## 2022-02-19 DIAGNOSIS — E1142 Type 2 diabetes mellitus with diabetic polyneuropathy: Secondary | ICD-10-CM | POA: Diagnosis not present

## 2022-02-19 DIAGNOSIS — R072 Precordial pain: Secondary | ICD-10-CM

## 2022-02-19 DIAGNOSIS — E1169 Type 2 diabetes mellitus with other specified complication: Secondary | ICD-10-CM | POA: Diagnosis not present

## 2022-02-19 DIAGNOSIS — F411 Generalized anxiety disorder: Secondary | ICD-10-CM

## 2022-02-19 DIAGNOSIS — Z1231 Encounter for screening mammogram for malignant neoplasm of breast: Secondary | ICD-10-CM

## 2022-02-19 DIAGNOSIS — I10 Essential (primary) hypertension: Secondary | ICD-10-CM

## 2022-02-19 DIAGNOSIS — Z114 Encounter for screening for human immunodeficiency virus [HIV]: Secondary | ICD-10-CM

## 2022-02-19 DIAGNOSIS — E1159 Type 2 diabetes mellitus with other circulatory complications: Secondary | ICD-10-CM

## 2022-02-19 DIAGNOSIS — Z1211 Encounter for screening for malignant neoplasm of colon: Secondary | ICD-10-CM

## 2022-02-19 DIAGNOSIS — M170 Bilateral primary osteoarthritis of knee: Secondary | ICD-10-CM

## 2022-02-19 LAB — ECHOCARDIOGRAM COMPLETE
AR max vel: 2.94 cm2
AV Area VTI: 2.44 cm2
AV Area mean vel: 2.48 cm2
AV Mean grad: 2 mmHg
AV Peak grad: 4 mmHg
Ao pk vel: 1 m/s
Area-P 1/2: 5.38 cm2
Calc EF: 71.9 %
S' Lateral: 2.7 cm
Single Plane A2C EF: 76.4 %
Single Plane A4C EF: 66.2 %

## 2022-02-19 MED ORDER — ALPRAZOLAM ER 0.5 MG PO TB24: 0.5000 mg | ORAL_TABLET | Freq: Every day | ORAL | 2 refills | Status: DC

## 2022-02-19 MED ORDER — OLMESARTAN MEDOXOMIL 40 MG PO TABS: 40.0000 mg | ORAL_TABLET | Freq: Every day | ORAL | 1 refills | Status: DC

## 2022-02-19 MED ORDER — XULTOPHY 100-3.6 UNIT-MG/ML ~~LOC~~ SOPN: 50.0000 [IU] | PEN_INJECTOR | Freq: Every day | SUBCUTANEOUS | 2 refills | Status: DC

## 2022-02-20 LAB — LIPID PANEL
Cholesterol: 171 mg/dL (ref <200)
HDL: 61 mg/dL (ref >=50)
LDL Cholesterol (Calc): 94 mg/dL (calc)
Non-HDL Cholesterol (Calc): 110 mg/dL (calc) (ref <130)
Total CHOL/HDL Ratio: 2.8 (calc) (ref <5.0)
Triglycerides: 75 mg/dL (ref <150)

## 2022-02-20 LAB — HEMOGLOBIN A1C
Hgb A1c MFr Bld: 7.4 % of total Hgb — ABNORMAL HIGH (ref <5.7)
Mean Plasma Glucose: 166 mg/dL
eAG (mmol/L): 9.2 mmol/L

## 2022-02-20 LAB — COMPLETE METABOLIC PANEL WITH GFR
AG Ratio: 1.5 (calc) (ref 1.0–2.5)
ALT: 13 U/L (ref 6–29)
AST: 15 U/L (ref 10–35)
Albumin: 4 g/dL (ref 3.6–5.1)
Alkaline phosphatase (APISO): 82 U/L (ref 37–153)
BUN: 16 mg/dL (ref 7–25)
CO2: 28 mmol/L (ref 20–32)
Calcium: 9.3 mg/dL (ref 8.6–10.4)
Chloride: 105 mmol/L (ref 98–110)
Creat: 0.77 mg/dL (ref 0.50–1.03)
Globulin: 2.6 g/dL (calc) (ref 1.9–3.7)
Glucose, Bld: 119 mg/dL — ABNORMAL HIGH (ref 65–99)
Potassium: 4.4 mmol/L (ref 3.5–5.3)
Sodium: 141 mmol/L (ref 135–146)
Total Bilirubin: 0.3 mg/dL (ref 0.2–1.2)
Total Protein: 6.6 g/dL (ref 6.1–8.1)
eGFR: 89 mL/min/{1.73_m2} (ref >=60)

## 2022-02-20 LAB — MICROALBUMIN / CREATININE URINE RATIO
Creatinine, Urine: 119 mg/dL (ref 20–275)
Microalb Creat Ratio: 2 mcg/mg creat (ref <30)
Microalb, Ur: 0.2 mg/dL

## 2022-02-21 ENCOUNTER — Telehealth: Payer: Self-pay | Admitting: Cardiology

## 2022-02-21 NOTE — Telephone Encounter (Signed)
Called patient back and reviewed her Echo results that were WNL. I informed her that I also sent the result note to her MyChart. ?

## 2022-02-21 NOTE — Telephone Encounter (Signed)
Please call with echocardiogram results.  

## 2022-02-26 ENCOUNTER — Encounter: Payer: Self-pay | Admitting: Family Medicine

## 2022-03-04 ENCOUNTER — Encounter: Payer: Self-pay | Admitting: Family Medicine

## 2022-03-05 ENCOUNTER — Other Ambulatory Visit: Payer: Self-pay | Admitting: Family Medicine

## 2022-03-05 DIAGNOSIS — G894 Chronic pain syndrome: Secondary | ICD-10-CM

## 2022-03-05 DIAGNOSIS — M170 Bilateral primary osteoarthritis of knee: Secondary | ICD-10-CM

## 2022-03-05 DIAGNOSIS — M797 Fibromyalgia: Secondary | ICD-10-CM

## 2022-03-06 ENCOUNTER — Ambulatory Visit: Payer: BC Managed Care – PPO | Admitting: Cardiology

## 2022-03-20 ENCOUNTER — Other Ambulatory Visit: Payer: Self-pay | Admitting: Sports Medicine

## 2022-03-20 ENCOUNTER — Encounter: Payer: BC Managed Care – PPO | Admitting: Nurse Practitioner

## 2022-03-20 DIAGNOSIS — M7551 Bursitis of right shoulder: Secondary | ICD-10-CM

## 2022-03-20 DIAGNOSIS — M7541 Impingement syndrome of right shoulder: Secondary | ICD-10-CM | POA: Diagnosis not present

## 2022-03-20 DIAGNOSIS — M25511 Pain in right shoulder: Secondary | ICD-10-CM

## 2022-03-20 DIAGNOSIS — M778 Other enthesopathies, not elsewhere classified: Secondary | ICD-10-CM

## 2022-03-20 DIAGNOSIS — G8929 Other chronic pain: Secondary | ICD-10-CM

## 2022-03-20 DIAGNOSIS — M19011 Primary osteoarthritis, right shoulder: Secondary | ICD-10-CM | POA: Diagnosis not present

## 2022-03-20 NOTE — Progress Notes (Deleted)
Name: Lindsay Mcdonald   MRN: [id number]    DOB: [birth year]   Date:03/20/2022 ? ?     Progress Note ? ?Subjective ? ?Chief Complaint ? ?No chief complaint on file. ? ? ?HPI ? ?Patient presents for annual CPE. ? ?Diet: *** ?Exercise: ***  ?Sleep:*** ? ?Personnel officer Visit from 09/17/20[facility] in [facility]  ?AUDIT-C Score 0  ? ?  ? ?Depression: Phq 9 is  {Desc; negative/positive:13464} ? ?  02/19/2022  ?  9:50 AM 11/18/2021  ? 10:28 AM 09/17/2021  ?  2:48 PM 08/21/2021  ?  9:20 AM 05/21/2021  ?  1:01 PM  ?Depression screen PHQ 2/9  ?Decreased Interest 1 1 1 2 1   ?Down, Depressed, Hopeless 1 1 1 1 1   ?PHQ - 2 Score 2 2 2 3 2   ?Altered sleeping 1 0 0 0 0  ?Tired, decreased energy 1 0 0 1 1  ?Change in appetite 1 0 0 0 0  ?Feeling bad or failure about yourself  1 0 0 0 0  ?Trouble concentrating 0 0 0 0 0  ?Moving slowly or fidgety/restless 0 0 0 0 0  ?Suicidal thoughts 0 0 0 0 0  ?PHQ-9 Score 6 2 2 4 3   ?Difficult doing work/chores Somewhat difficult Somewhat difficult Not difficult at all  Not difficult at all  ? ?Hypertension: ?BP Readings from Last 3 Encounters:  ?02/19/22 136/82  ?01/23/22 (!) 140/100  ?01/15/22 (!) 157/81  ? ?Obesity: ?Wt Readings from Last 3 Encounters:  ?02/19/22 268 lb 12.8 oz (121.9 kg)  ?01/23/22 270 lb (122.5 kg)  ?01/15/22 279 lb 15.8 oz (127 kg)  ? ?BMI Readings from Last 3 Encounters:  ?02/19/22 46.14 kg/m?  ?01/23/22 46.35 kg/m?  ?01/15/22 48.06 kg/m?  ?  ? ?Vaccines:  ?HPV: up to at age 29 , ask insurance if age between 35-45  ?Shingrix: 65-64 yo and ask insurance if covered when patient above 70 yo ?Pneumonia:  educated and discussed with patient. ?Flu:  educated and discussed with patient. ? ?Hep C Screening: 07/15/2012 ?STD testing and prevention (HIV/chl/gon/syphilis): *** ?Intimate partner violence:*** ?Sexual History : ?Menstrual History/LMP/Abnormal Bleeding:  ?Incontinence Symptoms:  ? ?Breast cancer:  ?- Last Mammogram: 06/15/2019, ordered on 02/19/2022 ?- BRCA gene  screening: none ? ?Osteoporosis: Discussed high calcium and vitamin D supplementation, weight bearing exercises ? ?Cervical cancer screening: 06/10/2019 ? ?Skin cancer: Discussed monitoring for atypical lesions  ?Colorectal cancer: cologuard done 01/12/2019, due ?Lung cancer:   Low Dose CT Chest recommended if Age 73-80 years, 20 pack-year currently smoking OR have quit w/in 15years. Patient does not qualify.   ?ECG: 01/16/2022 ? ?Advanced Care Planning: A voluntary discussion about advance care planning including the explanation and discussion of advance directives.  Discussed health care proxy and Living will, and the patient was able to identify a health care proxy as ***.  Patient {DOES_DOES GYK:59935} have a living will at present time. If patient does have living will, I have requested they bring this to the clinic to be scanned in to their chart. ? ?Lipids: ?Lab Results  ?Component Value Date  ? CHOL 171 02/19/2022  ? CHOL 186 02/22/2021  ? CHOL 183 04/09/2020  ? ?Lab Results  ?Component Value Date  ? HDL 61 02/19/2022  ? HDL 60 02/22/2021  ? HDL 56 04/09/2020  ? ?Lab Results  ?Component Value Date  ? [place] 94 02/19/2022  ? LDLCALC 110 (H) 02/22/2021  ? LDLCALC 108 (H) 04/09/2020  ? ?Lab  Results  ?Component Value Date  ? TRIG 75 02/19/2022  ? TRIG 71 02/22/2021  ? TRIG 98 04/09/2020  ? ?Lab Results  ?Component Value Date  ? CHOLHDL 2.8 02/19/2022  ? CHOLHDL 3.1 02/22/2021  ? CHOLHDL 3.3 04/09/2020  ? ?No results found for: LDLDIRECT ? ?Glucose: ?Glucose  ?Date Value Ref Range Status  ?08/01/2014 229 (H) 65 - 99 mg/dL Final  ?07/11/2014 225 (H) 65 - 99 mg/dL Final  ?07/20/2012 182 (H) 65 - 99 mg/dL Final  ? ?Glucose, Bld  ?Date Value Ref Range Status  ?02/19/2022 119 (H) 65 - 99 mg/dL Final  ?  Comment:  ?  . ?           Fasting reference interval ?. ?For someone without known diabetes, a glucose value ?between 100 and 125 mg/dL is consistent with ?prediabetes and should be confirmed with a ?follow-up  test. ?. ?  ?01/15/2022 142 (H) 70 - 99 mg/dL Final  ?  Comment:  ?  Glucose reference range applies only to samples taken after fasting for at least 8 hours.  ?02/22/2021 123 65 - 139 mg/dL Final  ?  Comment:  ?  . ?       Non-fasting reference interval ?. ?  ? ?Glucose-Capillary  ?Date Value Ref Range Status  ?05/16/2020 158 (H) 70 - 99 mg/dL Final  ?  Comment:  ?  Glucose reference range applies only to samples taken after fasting for at least 8 hours.  ?05/16/2020 144 (H) 70 - 99 mg/dL Final  ?  Comment:  ?  Glucose reference range applies only to samples taken after fasting for at least 8 hours.  ?03/13/2019 175 (H) 70 - 99 mg/dL Final  ? ? ?Patient Active Problem List  ? Diagnosis Date Noted  ? Type 2 diabetes mellitus ([place]) 05/21/2021  ? Hypertension associated with diabetes ([place]) 08/31/2020  ? History of marijuana use 03/30/2019  ? Fibromyalgia 12/31/2018  ? Venous vascular malformations 03/27/2016  ? Non compliance w medication regimen 09/26/2015  ? Seasonal allergic rhinitis 09/26/2015  ? Muscle spasms of neck 06/19/2015  ? Insomnia 06/19/2015  ? Primary osteoarthritis of right knee 06/17/2015  ? 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 Hyperlipidemia 06/17/2015  ? Benign hypertension 06/17/2015  ? Morbid obesity with BMI of 40.0-44.9, adult ([place]) 06/17/2015  ? Vitamin D deficiency 06/17/2015  ? ? ?Past Surgical History:  ?Procedure Laterality Date  ? BREAST REDUCTION SURGERY    ? ETHMOIDECTOMY Right 05/16/2020  ? Procedure: ETHMOIDECTOMY;  Surgeon: Margaretha Sheffield, MD;  Location: [facility];  Service: ENT;  Laterality: Right;  ? EXCISION ORAL TUMOR N/A 05/16/2020  ? Procedure: EXCISION OF SOFT PALATE PAPILLOMA;  Surgeon: Margaretha Sheffield, MD;  Location: [facility];  Service: ENT;  Laterality: N/A;  ? FRONTAL SINUS EXPLORATION Right 05/16/2020  ? Procedure: FRONTAL SINUS EXPLORATION;  Surgeon: Margaretha Sheffield, MD;  Location: [facility];  Service: ENT;  Laterality: Right;  ? IMAGE GUIDED SINUS SURGERY N/A  05/16/2020  ? Procedure: IMAGE GUIDED SINUS SURGERY;  Surgeon: Margaretha Sheffield, MD;  Location: [facility];  Service: ENT;  Laterality: N/A;  ? MAXILLARY ANTROSTOMY Bilateral 05/16/2020  ? Procedure: MAXILLARY ANTROSTOMY with tissue removal;  Surgeon: Margaretha Sheffield, MD;  Location: [facility];  Service: ENT;  Laterality: Bilateral;  ? nasal endoscopic    ? OOPHORECTOMY  ?  ? one ovary removed   ? REDUCTION MAMMAPLASTY Bilateral 2000  ? removal of ovary    ? SPHENOIDECTOMY Right  05/16/2020  ? Procedure: SPHENOIDECTOMY;  Surgeon: Margaretha Sheffield, MD;  Location: [facility];  Service: ENT;  Laterality: Right;  ? ? ?Family History  ?Problem Relation Age of Onset  ? Diabetes Mother   ? Cancer Mother 63  ?     Breast  ? Breast cancer Mother 63  ? Gout Mother   ? Diabetes Father   ? Diabetes Daughter   ?     Oldest Daughter  ? Breast cancer Maternal Aunt 33  ? Kidney cancer Maternal Aunt   ? Kidney disease Maternal Aunt   ? Stroke Maternal Uncle   ? Prostate cancer Neg Hx   ? Bladder Cancer Neg Hx   ? ? ?Social History  ? ?Socioeconomic History  ? Marital status: Divorced  ?  Spouse name: Not on file  ? Number of children: 2  ? Years of education: Not on file  ? Highest education level: Some college, no degree  ?Occupational History  ? Occupation: Designer, television/film set   ?Tobacco Use  ? Smoking status: Never  ? Smokeless tobacco: Never  ?Vaping Use  ? Vaping Use: Former  ?Substance and Sexual Activity  ? Alcohol use: No  ?  Alcohol/week: 0.0 standard drinks  ? Drug use: No  ? Sexual activity: Not Currently  ?  Partners: Male  ?  Birth control/protection: None  ?Other Topics Concern  ? Not on file  ?Social History Narrative  ? Divorced and he died since  ? Working at [employer]/design[employer].   ? ?Social Determinants of Health  ? ?Financial Resource Strain: Not on file  ?Food Insecurity: Not on file  ?Transportation Needs: Not on file  ?Physical Activity: Not on file  ?Stress: Not on file  ?Social Connections: Not on file  ?Intimate Partner Violence:  Not on file  ? ? ? ?Current Outpatient Medications:  ?  albuterol (VENTOLIN HFA) 108 (90 Base) MCG/ACT inhaler, Inhale 2 puffs into the lungs every 6 (six) hours as needed for wheezing or shortness of breath., Disp: 8

## 2022-03-26 ENCOUNTER — Ambulatory Visit: Payer: BC Managed Care – PPO | Admitting: Internal Medicine

## 2022-03-29 ENCOUNTER — Ambulatory Visit
Admission: RE | Admit: 2022-03-29 | Discharge: 2022-03-29 | Disposition: A | Discharge status: Home / Self Care | Payer: BC Managed Care – PPO | Source: Ambulatory Visit | Attending: Sports Medicine | Admitting: Sports Medicine

## 2022-03-29 ENCOUNTER — Encounter: Payer: Self-pay | Admitting: Family Medicine

## 2022-03-29 DIAGNOSIS — G8929 Other chronic pain: Secondary | ICD-10-CM | POA: Diagnosis not present

## 2022-03-29 DIAGNOSIS — M7551 Bursitis of right shoulder: Secondary | ICD-10-CM | POA: Diagnosis not present

## 2022-03-29 DIAGNOSIS — M7541 Impingement syndrome of right shoulder: Secondary | ICD-10-CM

## 2022-03-29 DIAGNOSIS — M25511 Pain in right shoulder: Secondary | ICD-10-CM | POA: Diagnosis not present

## 2022-03-29 DIAGNOSIS — M778 Other enthesopathies, not elsewhere classified: Secondary | ICD-10-CM | POA: Diagnosis not present

## 2022-03-29 DIAGNOSIS — M7591 Shoulder lesion, unspecified, right shoulder: Secondary | ICD-10-CM

## 2022-03-29 IMAGING — MR MR SHOULDER*R* W/O CM
4 of 5 series · 25 of 40 positions shown · non-contrast
Comparison: None.

CLINICAL DATA: Right shoulder pain while supine.

EXAM:
MRI OF THE RIGHT SHOULDER WITHOUT CONTRAST
TECHNIQUE: Multiplanar, multisequence MR imaging of the shoulder was performed.
No intravenous contrast was administered.

[Series 5: T2 fat-sat · axial · right · 4.0mm · 0.44mm/px · z∈[+8,+125]mm · 8 of 26 slices shown (1 of 3)]
[im 1/26]
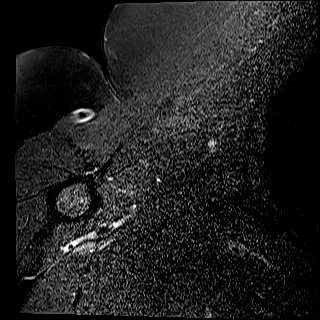
[im 3/26]
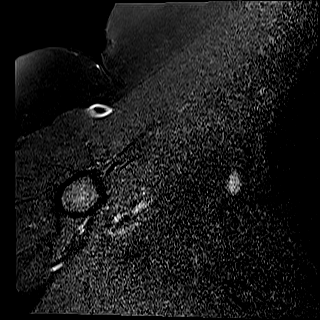
[im 9/26]
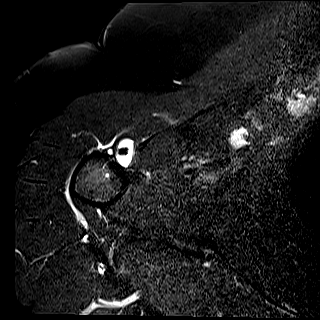
[im 12/26]
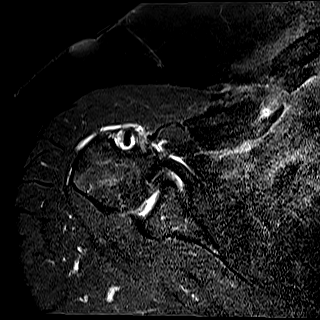
[im 14/26]
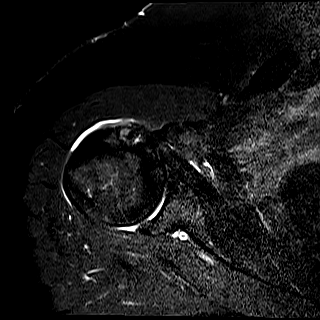
[im 17/26]
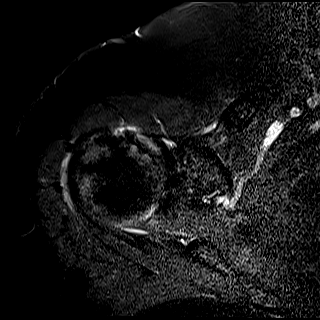
[im 23/26]
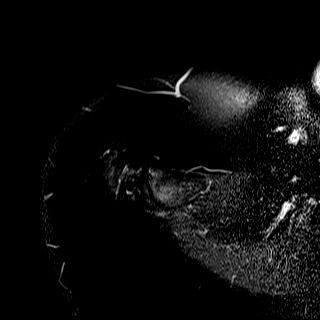
[im 26/26]
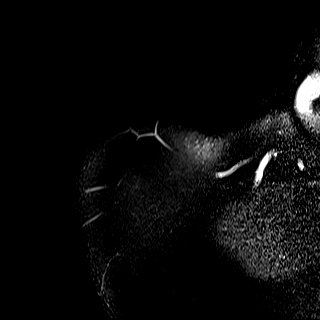

[Series 10: T2 fat-sat · coronal · right · 4.0mm · 0.22mm/px · 7 of 22 slices shown (2 of 3)]
[im 1/22]
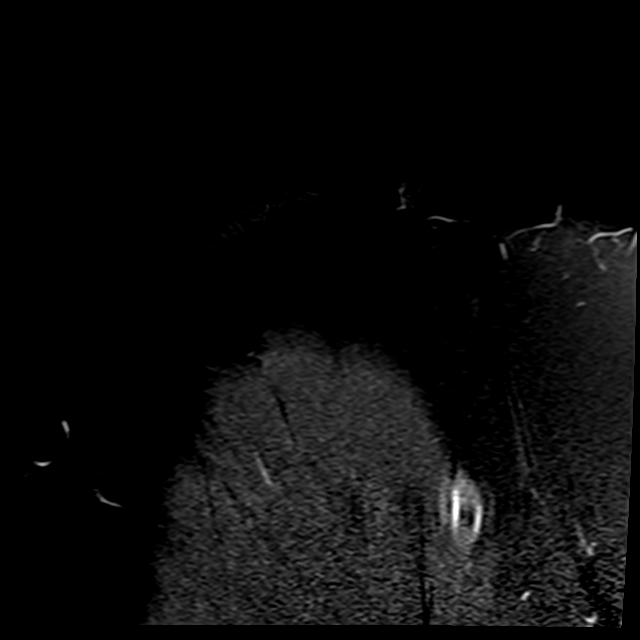
[im 4/22]
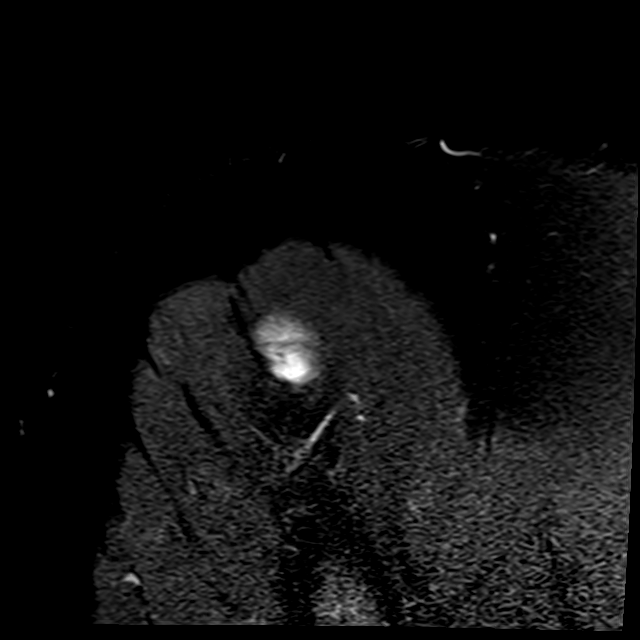
[im 7/22]
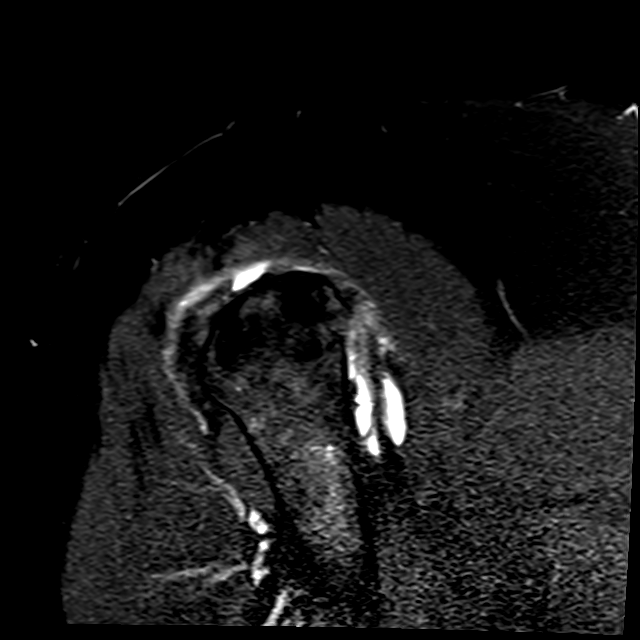
[im 10/22]
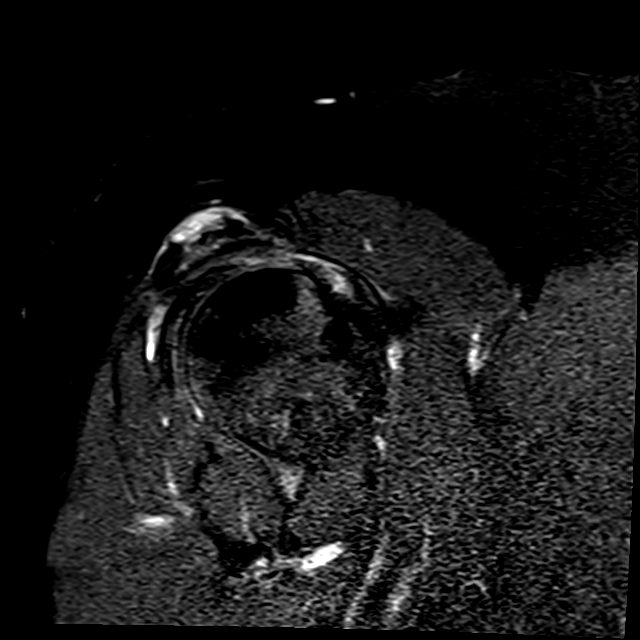
[im 13/22]
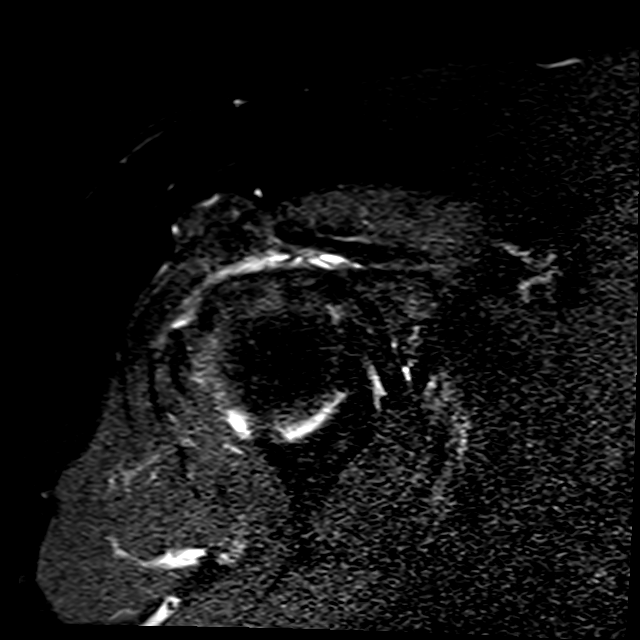
[im 16/22]
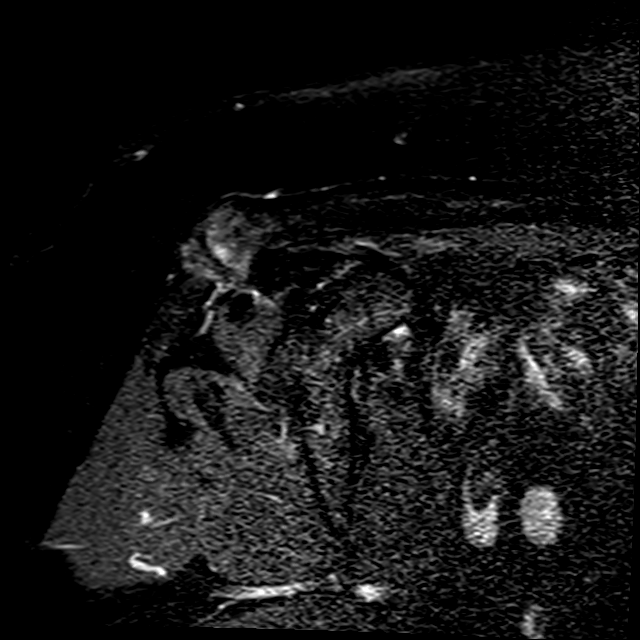
[im 19/22]
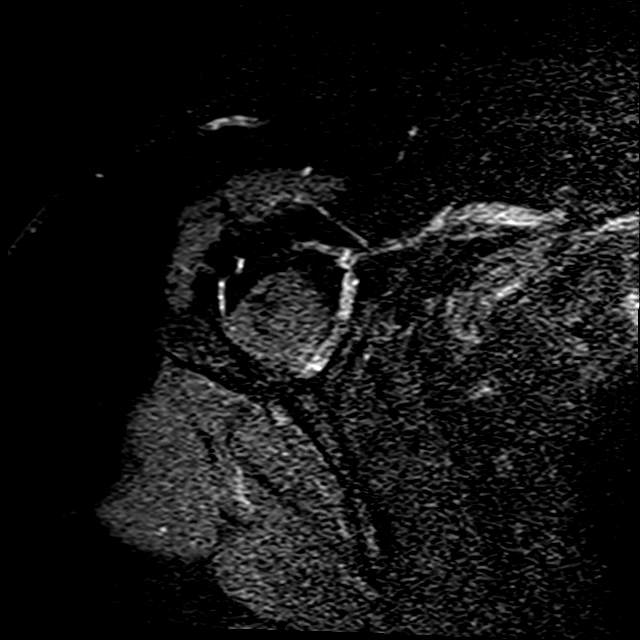

[Series 13: T2 fat-sat · oblique · right · 4.0mm · 0.44mm/px · 3 of 18 slices shown (3 of 3)]
[im 3/18]
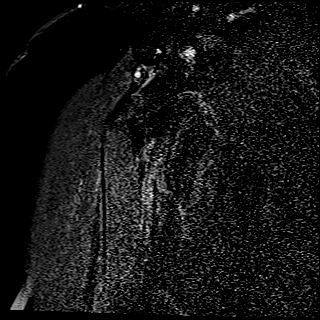
[im 9/18]
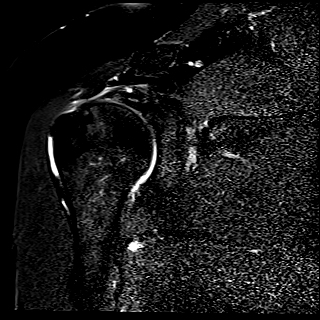
[im 15/18]
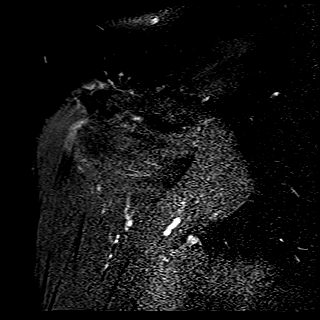

[Series 14: PD · oblique · right · 4.0mm · 0.44mm/px · 7 of 18 slices shown]
[im 1/18]
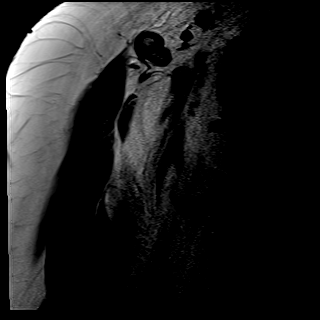
[im 3/18]
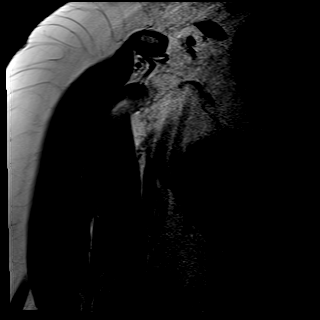
[im 6/18]
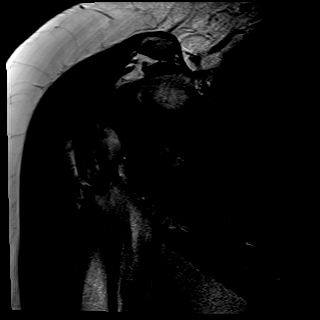
[im 9/18]
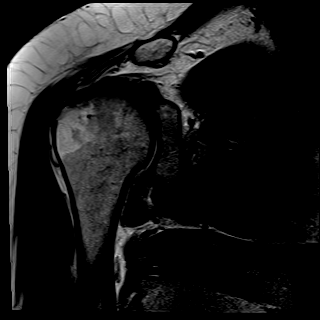
[im 12/18]
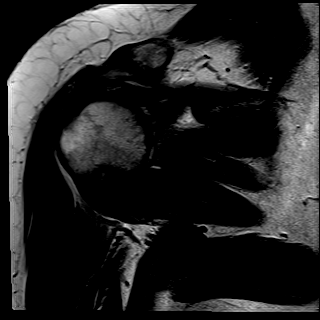
[im 15/18]
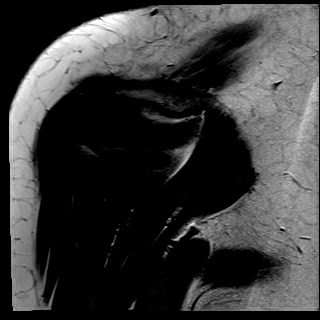
[im 18/18]
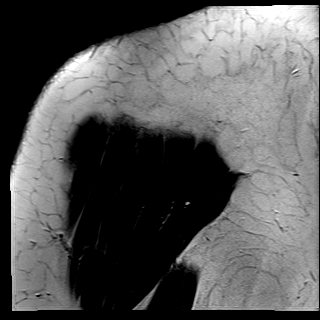

[25 of 40 positions shown; findings below may reference images not displayed]

FINDINGS: Rotator cuff: There is a large full-thickness tear of the entire
supraspinatus tendon footprint (coronal images 8 through 12) and
anterior aspect of the infraspinatus tendon footprint (coronal
images 7 and 8) with up to 1.4 cm retraction of the bursal sided
tendon fibers and up to 2.3 cm retraction of the articular sided
tendon fibers (coronal image 9). Mild-to-moderate intermediate T2
signal tendinosis of the superior subscapularis insertion with a
small fluid bright midsubstance partial-thickness tear (axial images
tendon 11, sagittal images 9 through 11).

Muscles: Moderate anterior supraspinatus muscle atrophy. Possible
mild superior subscapularis muscle atrophy.

Biceps long head: There is moderate intermediate T2 signal in
thickening tendinosis of the proximal long head of the biceps tendon
with a probable fluid bright midsubstance interstitial tear
(sagittal series 10, image 9). No tendon retraction.

Acromioclavicular Joint: There are mild degenerative changes of the
acromioclavicular joint including joint space narrowing, subchondral
marrow edema, and peripheral osteophytosis. Type II acromion. Mild
fluid extending from the glenohumeral joint into the
subacromial/subdeltoid bursa.

Glenohumeral Joint: Mild thinning of the glenoid and humeral head
cartilage.

Labrum: Grossly intact, but evaluation is limited by lack of
intraarticular fluid.

Bones:  No acute fracture.

Other: None.
IMPRESSION: 1. Large full-thickness tear of the entire supraspinatus tendon
footprint and anterior infraspinatus tendon footprint. Moderate
anterior supraspinatus muscle atrophy.
2. Superior subscapularis tendinosis with small fluid bright
midsubstance partial-thickness tear. Possible mild superior
subscapularis muscle atrophy.
3. Moderate proximal long head of the biceps tendinosis with
probable fluid bright midsubstance sensory distal tear.
4. Mild degenerative changes of the acromioclavicular joint.

## 2022-04-06 ENCOUNTER — Encounter: Payer: Self-pay | Admitting: Family Medicine

## 2022-04-16 ENCOUNTER — Encounter: Payer: Self-pay | Admitting: Nurse Practitioner

## 2022-04-16 ENCOUNTER — Other Ambulatory Visit: Payer: Self-pay

## 2022-04-16 ENCOUNTER — Ambulatory Visit (INDEPENDENT_AMBULATORY_CARE_PROVIDER_SITE_OTHER): Payer: BC Managed Care – PPO | Admitting: Nurse Practitioner

## 2022-04-16 VITALS — BP 134/72 | HR 96 | Temp 98.0°F | Resp 16 | Ht 64.0 in | Wt 271.9 lb

## 2022-04-16 DIAGNOSIS — Z0000 Encounter for general adult medical examination without abnormal findings: Secondary | ICD-10-CM

## 2022-04-16 NOTE — Progress Notes (Signed)
Name: Lindsay Mcdonald   MRN: [id number]    DOB: 01-09-1961   Date:04/16/2022 ? ?     Progress Note ? ?Subjective ? ?Chief Complaint ? ?Chief Complaint  ?Patient presents with  ? Annual Exam  ? ? ?HPI ? ?Patient presents for annual CPE. ? ?Diet: she tries to eat well balanced, she tries to eat healthy but enjoys junk food sometimes ?Exercise: not regularly active, discussed recommendations of 150 minutes of exercise a week ?Sleep:4-6 hours of sleep a night ? ?[place] Office Visit from 04/16/2022 in [facility]  ?AUDIT-C Score 0  ? ?  ? ?Depression: Phq 9 is  negative ? ?  04/16/2022  ?  9:20 AM 02/19/2022  ?  9:50 AM 11/18/2021  ? 10:28 AM 09/17/2021  ?  2:48 PM 08/21/2021  ?  9:20 AM  ?Depression screen PHQ 2/9  ?Decreased Interest 0 _0 ?Down, Depressed, Hopeless 0 _1 ?PHQ - 2 Score 0 _2 ?Altered sleeping 0 1 0 0 0  ?Tired, decreased energy 0 1 0 0 1  ?Change in appetite 0 1 0 0 0  ?Feeling bad or failure about yourself  0 1 0 0 0  ?Trouble concentrating 0 0 0 0 0  ?Moving slowly or fidgety/restless 0 0 0 0 0  ?Suicidal thoughts 0 0 0 0 0  ?PHQ-9 Score 0 _3 ?Difficult doing work/chores Not difficult at all Somewhat difficult Somewhat difficult Not difficult at all   ? ?Hypertension: ?BP Readings from Last 3 Encounters:  ?04/16/22 134/72  ?02/19/22 136/82  ?01/23/22 (!) 140/100  ? ?Obesity: ?Wt Readings from Last 3 Encounters:  ?04/16/22 271 lb 14.4 oz (123.3 kg)  ?02/19/22 268 lb 12.8 oz (121.9 kg)  ?01/23/22 270 lb (122.5 kg)  ? ?BMI Readings from Last 3 Encounters:  ?04/16/22 46.67 kg/m?  ?02/19/22 46.14 kg/m?  ?01/23/22 46.35 kg/m?  ?  ? ?Vaccines:  ?HPV: up to at age 67 , ask insurance if age between 54-45  ?Shingrix: 22-64 yo and ask insurance if covered when patient above 70 yo ?Pneumonia:  educated and discussed with patient. ?Flu:  educated and discussed with patient. ? ?Hep C Screening: 07/15/2012 ?STD testing and prevention (HIV/chl/gon/syphilis): declined ?Intimate  partner violence:none ?Sexual History : not currently sexually active ?Menstrual History/LMP/Abnormal Bleeding: postmenopausal  ?Incontinence Symptoms: yes, has seen urology ? ?Breast cancer:  ?- Last Mammogram: 06/15/2019, order placed on 02/19/2022, will give card to call and schedule ?- BRCA gene screening: none ? ?Osteoporosis: Discussed high calcium and vitamin D supplementation, weight bearing exercises ? ?Cervical cancer screening: 06/10/2019 ? ?Skin cancer: Discussed monitoring for atypical lesions  ?Colorectal cancer: 01/12/2019 cologuard, she is due for screening, patient declined screening, after talking with the patient, she is going to consider her options ?Lung cancer:   Low Dose CT Chest recommended if Age 91-80 years, 20 pack-year currently smoking OR have quit w/in 15years. Patient does not qualify.   ?ECG: 01/16/2022 ? ?Advanced Care Planning: A voluntary discussion about advance care planning including the explanation and discussion of advance directives.  Discussed health care proxy and Living will, and the patient was able to identify a health care proxy as daughters.  Patient does not have a living will at present time. If patient does have living will, I have requested they bring this to the clinic to be scanned in to their chart. ? ?Lipids: ?Lab Results  ?Component  Value Date  ? CHOL 171 02/19/2022  ? CHOL 186 02/22/2021  ? CHOL 183 04/09/2020  ? ?Lab Results  ?Component Value Date  ? HDL 61 02/19/2022  ? HDL 60 02/22/2021  ? HDL 56 04/09/2020  ? ?Lab Results  ?Component Value Date  ? [place] 94 02/19/2022  ? LDLCALC 110 (H) 02/22/2021  ? LDLCALC 108 (H) 04/09/2020  ? ?Lab Results  ?Component Value Date  ? TRIG 75 02/19/2022  ? TRIG 71 02/22/2021  ? TRIG 98 04/09/2020  ? ?Lab Results  ?Component Value Date  ? CHOLHDL 2.8 02/19/2022  ? CHOLHDL 3.1 02/22/2021  ? CHOLHDL 3.3 04/09/2020  ? ?No results found for: LDLDIRECT ? ?Glucose: ?Glucose  ?Date Value Ref Range Status  ?08/01/2014 229 (H) 65 - 99  mg/dL Final  ?07/11/2014 225 (H) 65 - 99 mg/dL Final  ?07/20/2012 182 (H) 65 - 99 mg/dL Final  ? ?Glucose, Bld  ?Date Value Ref Range Status  ?02/19/2022 119 (H) 65 - 99 mg/dL Final  ?  Comment:  ?  . ?           Fasting reference interval ?. ?For someone without known diabetes, a glucose value ?between 100 and 125 mg/dL is consistent with ?prediabetes and should be confirmed with a ?follow-up test. ?. ?  ?01/15/2022 142 (H) 70 - 99 mg/dL Final  ?  Comment:  ?  Glucose reference range applies only to samples taken after fasting for at least 8 hours.  ?02/22/2021 123 65 - 139 mg/dL Final  ?  Comment:  ?  . ?       Non-fasting reference interval ?. ?  ? ?Glucose-Capillary  ?Date Value Ref Range Status  ?05/16/2020 158 (H) 70 - 99 mg/dL Final  ?  Comment:  ?  Glucose reference range applies only to samples taken after fasting for at least 8 hours.  ?05/16/2020 144 (H) 70 - 99 mg/dL Final  ?  Comment:  ?  Glucose reference range applies only to samples taken after fasting for at least 8 hours.  ?03/13/2019 175 (H) 70 - 99 mg/dL Final  ? ? ?Patient Active Problem List  ? Diagnosis Date Noted  ? Type 2 diabetes mellitus ([place]) 05/21/2021  ? Hypertension associated with diabetes ([place]) 08/31/2020  ? History of marijuana use 03/30/2019  ? Fibromyalgia 12/31/2018  ? Venous vascular malformations 03/27/2016  ? Non compliance w medication regimen 09/26/2015  ? Seasonal allergic rhinitis 09/26/2015  ? Muscle spasms of neck 06/19/2015  ? Insomnia 06/19/2015  ? Primary osteoarthritis of right knee 06/17/2015  ? 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 Hyperlipidemia 06/17/2015  ? Benign hypertension 06/17/2015  ? Morbid obesity with BMI of 40.0-44.9, adult ([place]) 06/17/2015  ? Vitamin D deficiency 06/17/2015  ? ? ?Past Surgical History:  ?Procedure Laterality Date  ? BREAST REDUCTION SURGERY    ? ETHMOIDECTOMY Right 05/16/2020  ? Procedure: ETHMOIDECTOMY;  Surgeon: Margaretha Sheffield, MD;  Location: [facility];  Service: ENT;   Laterality: Right;  ? EXCISION ORAL TUMOR N/A 05/16/2020  ? Procedure: EXCISION OF SOFT PALATE PAPILLOMA;  Surgeon: Margaretha Sheffield, MD;  Location: [facility];  Service: ENT;  Laterality: N/A;  ? FRONTAL SINUS EXPLORATION Right 05/16/2020  ? Procedure: FRONTAL SINUS EXPLORATION;  Surgeon: Margaretha Sheffield, MD;  Location: [facility];  Service: ENT;  Laterality: Right;  ? IMAGE GUIDED SINUS SURGERY N/A 05/16/2020  ? Procedure: IMAGE GUIDED SINUS SURGERY;  Surgeon: Margaretha Sheffield, MD;  Location: [facility];  Service: ENT;  Laterality: N/A;  ? MAXILLARY ANTROSTOMY Bilateral 05/16/2020  ? Procedure: MAXILLARY ANTROSTOMY with tissue removal;  Surgeon: Margaretha Sheffield, MD;  Location: [facility];  Service: ENT;  Laterality: Bilateral;  ? nasal endoscopic    ? OOPHORECTOMY  ?  ? one ovary removed   ? REDUCTION MAMMAPLASTY Bilateral 2000  ? removal of ovary    ? SPHENOIDECTOMY Right 05/16/2020  ? Procedure: SPHENOIDECTOMY;  Surgeon: Margaretha Sheffield, MD;  Location: [facility];  Service: ENT;  Laterality: Right;  ? ? ?Family History  ?Problem Relation Age of Onset  ? Diabetes Mother   ? Cancer Mother 16  ?     Breast  ? Breast cancer Mother 15  ? Gout Mother   ? Diabetes Father   ? Diabetes Daughter   ?     Oldest Daughter  ? Breast cancer Maternal Aunt 6  ? Kidney cancer Maternal Aunt   ? Kidney disease Maternal Aunt   ? Stroke Maternal Uncle   ? Prostate cancer Neg Hx   ? Bladder Cancer Neg Hx   ? ? ?Social History  ? ?Socioeconomic History  ? Marital status: Divorced  ?  Spouse name: Not on file  ? Number of children: 2  ? Years of education: Not on file  ? Highest education level: Some college, no degree  ?Occupational History  ? Occupation: Designer, television/film set   ?Tobacco Use  ? Smoking status: Never  ? Smokeless tobacco: Never  ?Vaping Use  ? Vaping Use: Former  ?Substance and Sexual Activity  ? Alcohol use: No  ?  Alcohol/week: 0.0 standard drinks  ? Drug use: No  ? Sexual activity: Not Currently  ?  Partners: Male  ?  Birth control/protection: None   ?Other Topics Concern  ? Not on file  ?Social History Narrative  ? Divorced and he died since  ? Working at [employer]/design[employer].   ? ?Social Determinants of Health  ? ?Financial Resource Strain: Medium

## 2022-04-16 NOTE — Progress Notes (Incomplete)
04/16/22 ?2:39 PM  ? ?Lindsay Mcdonald ?[birth year] ?[id number] ? ?Referring provider:  ?Steele Sizer, MD ?[address] ?[address] ?[place],  [place] [address] ?No chief complaint on file. ? ? ?Urological history ?Nocturia  ?- Diagnosed with sleep apnea at time of visit with Dr Diamantina Providence in 09/2021 she has not started on CPAP.  ?- Conservative management  ? ? ?HPI: ?Lindsay Mcdonald is a 61 y.o.female who presents today for further evaluation of numbness at the urethra when beginning to urinate.  ? ? ? ? ? ?PMH: ?Past Medical History:  ?Diagnosis Date  ? Anxiety   ? Arthritis 03/2020  ? osteoarthritis both knees  ? Chronic pain of right knee   ? Chronic sinusitis   ? DDD (degenerative disc disease), cervical   ? cervical radiculopathy  ? Depression   ? Diabetes mellitus   ? insulin dependant  ? Fibromyalgia   ? GERD (gastroesophageal reflux disease)   ? High cholesterol   ? Hyperlipidemia   ? Hypertension   ? Insomnia   ? Muscle pain   ? Nasal polyp   ? Neuromuscular disorder ([place])   ? diabetic neuropathy  ? Obesity   ? Palpitations   ? Tendonitis of knee, left 04/2020  ? received steroid injection  ? Vitamin D deficiency   ? Vitamin D deficiency   ? ? ?Surgical History: ?Past Surgical History:  ?Procedure Laterality Date  ? BREAST REDUCTION SURGERY    ? ETHMOIDECTOMY Right 05/16/2020  ? Procedure: ETHMOIDECTOMY;  Surgeon: Margaretha Sheffield, MD;  Location: [facility];  Service: ENT;  Laterality: Right;  ? EXCISION ORAL TUMOR N/A 05/16/2020  ? Procedure: EXCISION OF SOFT PALATE PAPILLOMA;  Surgeon: Margaretha Sheffield, MD;  Location: [facility];  Service: ENT;  Laterality: N/A;  ? FRONTAL SINUS EXPLORATION Right 05/16/2020  ? Procedure: FRONTAL SINUS EXPLORATION;  Surgeon: Margaretha Sheffield, MD;  Location: [facility];  Service: ENT;  Laterality: Right;  ? IMAGE GUIDED SINUS SURGERY N/A 05/16/2020  ? Procedure: IMAGE GUIDED SINUS SURGERY;  Surgeon: Margaretha Sheffield, MD;  Location: [facility];  Service: ENT;  Laterality: N/A;  ? MAXILLARY ANTROSTOMY  Bilateral 05/16/2020  ? Procedure: MAXILLARY ANTROSTOMY with tissue removal;  Surgeon: Margaretha Sheffield, MD;  Location: [facility];  Service: ENT;  Laterality: Bilateral;  ? nasal endoscopic    ? OOPHORECTOMY  ?  ? one ovary removed   ? REDUCTION MAMMAPLASTY Bilateral 2000  ? removal of ovary    ? SPHENOIDECTOMY Right 05/16/2020  ? Procedure: SPHENOIDECTOMY;  Surgeon: Margaretha Sheffield, MD;  Location: [facility];  Service: ENT;  Laterality: Right;  ? ? ?Home Medications:  ?Allergies as of 04/17/2022   ? ?   Reactions  ? Simvastatin   ? Muscle cramps  ? Atorvastatin Other (See Comments)  ? muscle cramps  ? Compazine Other (See Comments)  ? Twitching and spasms of neck muscles  ? Penicillins Rash  ? Reaction under 25, patient unsure that she still has an allergy  ? ?  ? ?  ?Medication List  ?  ? ?  ? Accurate as of April 16, 2022  2:39 PM. If you have any questions, ask your nurse or doctor.  ?  ?  ? ?  ? ?albuterol 108 (90 Base) MCG/ACT inhaler ?Commonly known as: VENTOLIN HFA ?Inhale 2 puffs into the lungs every 6 (six) hours as needed for wheezing or shortness of breath. ?  ?ALPRAZolam 0.5 MG 24 hr tablet ?Commonly known as: XANAX XR ?Take 1 tablet (0.5  mg total) by mouth daily. ?  ?aspirin EC 81 MG tablet ?Take 81 mg by mouth daily. Swallow whole. ?  ?fluticasone 50 MCG/ACT nasal spray ?Commonly known as: FLONASE ?Place 2 sprays into both nostrils daily as needed for allergies or rhinitis. ?  ?Melatonin 10 MG Tabs ?Take 10 mg by mouth at bedtime as needed (sleep). ?  ?metaxalone 800 MG tablet ?Commonly known as: SKELAXIN ?Take 1 tablet (800 mg total) by mouth 3 (three) times daily as needed for muscle spasms. ?  ?metFORMIN 750 MG 24 hr tablet ?Commonly known as: GLUCOPHAGE-XR ?Take 2 tablets (1,500 mg total) by mouth daily with breakfast. ?  ?olmesartan 40 MG tablet ?Commonly known as: Benicar ?Take 1 tablet (40 mg total) by mouth daily. In place of lisinopril for bp ?  ?Xultophy 100-3.6 UNIT-MG/ML Sopn ?Generic drug: Insulin  Degludec-Liraglutide ?Inject 50 Units into the skin daily. ?  ? ?  ? ? ?Allergies:  ?Allergies  ?Allergen Reactions  ? Simvastatin   ?  Muscle cramps ?  ? Atorvastatin Other (See Comments)  ?  muscle cramps  ? Compazine Other (See Comments)  ?  Twitching and spasms of neck muscles  ? Penicillins Rash  ?  Reaction under 25, patient unsure that she still has an allergy  ? ? ?Family History: ?Family History  ?Problem Relation Age of Onset  ? Diabetes Mother   ? Cancer Mother 12  ?     Breast  ? Breast cancer Mother 34  ? Gout Mother   ? Diabetes Father   ? Diabetes Daughter   ?     Oldest Daughter  ? Breast cancer Maternal Aunt 63  ? Kidney cancer Maternal Aunt   ? Kidney disease Maternal Aunt   ? Stroke Maternal Uncle   ? Prostate cancer Neg Hx   ? Bladder Cancer Neg Hx   ? ? ?Social History:  reports that she has never smoked. She has never used smokeless tobacco. She reports that she does not drink alcohol and does not use drugs. ? ? ?Physical Exam: ?There were no vitals taken for this visit.  ?Constitutional:  Alert and oriented, No acute distress. ?HEENT: [place] AT, moist mucus membranes.  Trachea midline, no masses. ?Cardiovascular: No clubbing, cyanosis, or edema. ?Respiratory: Normal respiratory effort, no increased work of breathing. ?GI: Abdomen is soft, nontender, nondistended, no abdominal masses ?GU: No CVA tenderness ?Lymph: No cervical or inguinal lymphadenopathy. ?Skin: No rashes, bruises or suspicious lesions. ?Neurologic: Grossly intact, no focal deficits, moving all 4 extremities. ?Psychiatric: Normal mood and affect. ? ?Laboratory Data: ? ?Lab Results  ?Component Value Date  ? CREATININE 0.77 02/19/2022  ? ?Lab Results  ?Component Value Date  ? HGBA1C 7.4 (H) 02/19/2022  ? ?Component ?    Latest Ref Rng 02/19/2022  ?Cholesterol ?    <200 mg/dL 171   ?HDL Cholesterol ?    > OR = 50 mg/dL 61   ?Triglycerides ?    <150 mg/dL 75   ?LDL Cholesterol (Calc) ?    mg/dL (calc) 94   ?Total CHOL/HDL Ratio ?    <5.0  (calc) 2.8   ?Non-HDL Cholesterol (Calc) ?    <130 mg/dL (calc) 110   ? ? ?Component ?    Latest Ref Rng 02/19/2022  ?Glucose ?    65 - 99 mg/dL 119 (H)   ?BUN ?    7 - 25 mg/dL 16   ?Creatinine ?    0.50 - 1.03 mg/dL 0.77   ?  BUN/Creatinine Ratio ?    6 - 22 (calc) NOT APPLICABLE   ?Sodium ?    135 - 146 mmol/L 141   ?Potassium ?    3.5 - 5.3 mmol/L 4.4   ?Chloride ?    98 - 110 mmol/L 105   ?CO2 ?    20 - 32 mmol/L 28   ?Calcium ?    8.6 - 10.4 mg/dL 9.3   ?Total Protein ?    6.1 - 8.1 g/dL 6.6   ?Albumin MSPROF ?    3.6 - 5.1 g/dL 4.0   ?Globulin ?    1.9 - 3.7 g/dL (calc) 2.6   ?AG Ratio ?    1.0 - 2.5 (calc) 1.5   ?Total Bilirubin ?    0.2 - 1.2 mg/dL 0.3   ?Alkaline phosphatase (APISO) ?    37 - 153 U/L 82   ?AST ?    10 - 35 U/L 15   ?ALT ?    6 - 29 U/L 13   ?eGFR ?    > OR = 60 mL/min/1.57m 89   ?  ?(H) High ? ?Urinalysis ? ? ?Pertinent Imaging: ? ? ?Assessment & Plan:   ? ? ?No follow-ups on file. ? ?[facility]?[address] ?[place] [id number]?[phone]?

## 2022-04-17 ENCOUNTER — Ambulatory Visit: Payer: BC Managed Care – PPO | Admitting: Urology

## 2022-04-24 DIAGNOSIS — E119 Type 2 diabetes mellitus without complications: Secondary | ICD-10-CM | POA: Diagnosis not present

## 2022-04-24 LAB — HM DIABETES EYE EXAM

## 2022-05-03 ENCOUNTER — Other Ambulatory Visit: Payer: Self-pay | Admitting: Family Medicine

## 2022-05-13 NOTE — Progress Notes (Unsigned)
Name: Lindsay Mcdonald   MRN: [id number]    DOB: 1961/11/14   Date:05/14/2022       Progress Note  Subjective  Chief Complaint  Follow Up  HPI  Mild Major Depression/insomnia: she is afraid of taking medications, explained again that SSRI is safer than BZD.   She has been on alprazolam XR and is afraid to stop taking medication. Phq 9 is still above goal   Denies suicidal thoughts or ideation. She is now living with her daughter due to financial difficulties and sleeping in a sofa .She is now seeing a counselor and is trying to switch jobs, worries about her mother that is 69 and has been sickly. Discussed controlled medication and importance of doing regular drug screen.   OA: seeing Dr. Candelaria Stagers and had steroid injections on both knees ,no effusion or redness,pain resolved ,she has a history of bakers cysts, knees are stiff intermittently but doing better, also had steroid injections on both shoulders - large full thickness tear on both shoulders  FMS: she has a long history of feeling aching and tender worse on trunk and upper extremities. Pain on shoulders, arms, stiff on her hips . Feels a little stiff when first gets up, she has some mental fogginess, she has tried Lyrica and Duloxetine but stopped on her own. She is taking Tylenol arthritis , she stopped taking Ibuprofen since Dec 2022 Stable    DMII:glucose lately has been elevated due to prednisone  A1C went from 6.6 % to 7.9% to 8.1% ,6.7 %, 7 % 6.6 % , 7.1 % ,8.1 % to 7.1 % last visit was 7.4% but too soon to repeat . She has been compliant with Xultophy ( sometimes skips by accident)  and also taking Metformin 1500 mg daily No polyphagia, polydipsia or polyuria. She states occasional has tingling on her toes .She also has associated HTN, dyslipidemia, neuropathy and obesity.    OSA: she has not been back for titration yet, she states not having problems lately, used to gasp for air  HTN: she is now on Benicar 40 mg, no chest pain or  palpation . BP is at goal, no side effects of medication    Hyperlipidemia: she has tried multiple statins and zetia but unable to tolerate due to severe muscle aches.    Morbid Obesity: BMI is still above goal but she is trending down for the past couple of weeks. She is trying to cut down on junk food    DDD cervical spine: she went to  [facility]23/[facility] for evaluation, had c- spine x-ray that showed DDD C5-6, also had a normal shoulder x-ray, but MRI shoulder showed rotator cuff tear . Pain was sharp and shooting down left elbow, it was affecting her sleep, she saw Dr. Rudene Christians and he recommend evaluation by neurosurgeon and PT, however in the mean time she saw Dr. Candelaria Stagers since and steroid injections on shoulder improved symptoms   Patient Active Problem List   Diagnosis Date Noted   Type 2 diabetes mellitus ([place]) 05/21/2021   Hypertension associated with diabetes ([place]) 08/31/2020   History of marijuana use 03/30/2019   Fibromyalgia 12/31/2018   Venous vascular malformations 03/27/2016   Non compliance w medication regimen 09/26/2015   Seasonal allergic rhinitis 09/26/2015   Muscle spasms of neck 06/19/2015   Insomnia 06/19/2015   Primary osteoarthritis of right knee 06/17/2015   Depression, major, recurrent, mild ([place]) 06/17/2015   Hyperlipidemia 06/17/2015   Benign hypertension 06/17/2015   Morbid obesity  with BMI of 40.0-44.9, adult ([place]) 06/17/2015   Vitamin D deficiency 06/17/2015    Past Surgical History:  Procedure Laterality Date   BREAST REDUCTION SURGERY     ETHMOIDECTOMY Right 05/16/2020   Procedure: ETHMOIDECTOMY;  Surgeon: Margaretha Sheffield, MD;  Location: [facility] ORS;  Service: ENT;  Laterality: Right;   EXCISION ORAL TUMOR N/A 05/16/2020   Procedure: EXCISION OF SOFT PALATE PAPILLOMA;  Surgeon: Margaretha Sheffield, MD;  Location: [facility] OR[facility];  Service: ENT;  Laterality: N/A;   FRONTAL SINUS EXPLORATION Right 05/16/2020   Procedure: FRONTAL SINUS EXPLORATION;  Surgeon: Margaretha Sheffield, MD;   Location: [facility];  Service: ENT;  Laterality: Right;   IMAGE GUIDED SINUS SURGERY N/A 05/16/2020   Procedure: IMAGE GUIDED SINUS SURGERY;  Surgeon: Margaretha Sheffield, MD;  Location: [facility] OR[facility];  Service: ENT;  Laterality: N/A;   MAXILLARY ANTROSTOMY Bilateral 05/16/2020   Procedure: MAXILLARY ANTROSTOMY with tissue removal;  Surgeon: Margaretha Sheffield, MD;  Location: [facility] OR[facility];  Service: ENT;  Laterality: Bilateral;   nasal endoscopic     OOPHORECTOMY  ?   one ovary removed    REDUCTION MAMMAPLASTY Bilateral 2000   removal of ovary     SPHENOIDECTOMY Right 05/16/2020   Procedure: SPHENOIDECTOMY;  Surgeon: Margaretha Sheffield, MD;  Location: [facility] OR[facility];  Service: ENT;  Laterality: Right;    Family History  Problem Relation Age of Onset   Diabetes Mother    Cancer Mother 58       Breast   Breast cancer Mother 25   Gout Mother    Diabetes Father    Diabetes Daughter        Oldest Daughter   Breast cancer Maternal Aunt 55   Kidney cancer Maternal Aunt    Kidney disease Maternal Aunt    Stroke Maternal Uncle    Prostate cancer Neg Hx    Bladder Cancer Neg Hx     Social History   Tobacco Use   Smoking status: Never   Smokeless tobacco: Never  Substance Use Topics   Alcohol use: No    Alcohol/week: 0.0 standard drinks     Current Outpatient Medications:    albuterol (VENTOLIN HFA) 108 (90 Base) MCG/ACT inhaler, Inhale 2 puffs into the lungs every 6 (six) hours as needed for wheezing or shortness of breath., Disp: 8 g, Rfl: 2   ALPRAZolam (XANAX XR) 0.5 MG 24 hr tablet, Take 1 tablet (0.5 mg total) by mouth daily., Disp: 30 tablet, Rfl: 2   aspirin EC 81 MG tablet, Take 81 mg by mouth daily. Swallow whole., Disp: , Rfl:    fluticasone (FLONASE) 50 MCG/ACT nasal spray, Place 2 sprays into both nostrils daily as needed for allergies or rhinitis., Disp: , Rfl:    Insulin Degludec-Liraglutide (XULTOPHY) 100-3.6 UNIT-MG/ML SOPN, Inject 50 Units into the skin daily., Disp: 15 mL, Rfl: 2   Melatonin 10  MG TABS, Take 10 mg by mouth at bedtime as needed (sleep). , Disp: , Rfl:    metaxalone (SKELAXIN) 800 MG tablet, Take 1 tablet by mouth three times daily as needed for muscle spasm, Disp: 90 tablet, Rfl: 0   metFORMIN (GLUCOPHAGE-XR) 750 MG 24 hr tablet, Take 2 tablets (1,500 mg total) by mouth daily with breakfast., Disp: 180 tablet, Rfl: 1   olmesartan (BENICAR) 40 MG tablet, Take 1 tablet (40 mg total) by mouth daily. In place of lisinopril for bp, Disp: 90 tablet, Rfl: 1  Allergies  Allergen Reactions   Simvastatin     Muscle  cramps    Atorvastatin Other (See Comments)    muscle cramps   Compazine Other (See Comments)    Twitching and spasms of neck muscles   Penicillins Rash    Reaction under 25, patient unsure that she still has an allergy    I personally reviewed active problem list, medication list, allergies, family history, social history, health maintenance with the patient/caregiver today.   ROS  Constitutional: Negative for fever or weight change.  Respiratory: Positive for intermittent cough and shortness of breath.   Cardiovascular: Negative for chest pain or palpitations.  Gastrointestinal: Negative for abdominal pain, no bowel changes.  Musculoskeletal: Negative for gait problem or joint swelling.  Skin: Negative for rash.  Neurological: Negative for dizziness or headache.  No other specific complaints in a complete review of systems (except as listed in HPI above).   Objective  Vitals:   05/14/22 0821  BP: 132/84  Pulse: 97  Resp: 16  SpO2: 99%  Weight: 270 lb (122.5 kg)  Height: 5' 5"  (1.651 m)    Body mass index is 44.93 kg/m.  Physical Exam  Constitutional: Patient appears well-developed and well-nourished. Obese  No distress.  HEENT: head atraumatic, normocephalic, pupils equal and reactive to light, neck supple Cardiovascular: Normal rate, regular rhythm and normal heart sounds.  No murmur heard. No BLE edema. Pulmonary/Chest: Effort normal  and breath sounds normal. No respiratory distress. Abdominal: Soft.  There is no tenderness. Psychiatric: Patient has a normal mood and affect. behavior is normal. Judgment and thought content normal.   Recent Results (from the past 2160 hour(s))  ECHOCARDIOGRAM COMPLETE     Status: None   Collection Time: 02/19/22  8:56 AM  Result Value Ref Range   AR max vel 2.94 cm2   AV Peak grad 4.0 mmHg   Ao pk vel 1.00 m/s   S' Lateral 2.70 cm   Area-P 1/2 5.38 cm2   AV Area VTI 2.44 cm2   AV Mean grad 2.0 mmHg   Single Plane A4C EF 66.2 %   Single Plane A2C EF 76.4 %   Calc EF 71.9 %   AV Area mean vel 2.48 cm2  HgB A1c     Status: Abnormal   Collection Time: 02/19/22 11:02 AM  Result Value Ref Range   Hgb A1c MFr Bld 7.4 (H) <5.7 % of total Hgb    Comment: For someone without known diabetes, a hemoglobin A1c value of 6.5% or greater indicates that they may have  diabetes and this should be confirmed with a follow-up  test. . For someone with known diabetes, a value <7% indicates  that their diabetes is well controlled and a value  greater than or equal to 7% indicates suboptimal  control. A1c targets should be individualized based on  duration of diabetes, age, comorbid conditions, and  other considerations. . Currently, no consensus exists regarding use of hemoglobin A1c for diagnosis of diabetes for children. .    Mean Plasma Glucose 166 mg/dL   eAG (mmol/L) 9.2 mmol/L  Microalbumin / creatinine urine ratio     Status: None   Collection Time: 02/19/22 11:02 AM  Result Value Ref Range   Creatinine, Urine 119 20 - 275 mg/dL   Microalb, Ur 0.2 mg/dL    Comment: Reference Range Not established    Microalb Creat Ratio 2 <30 mcg/mg creat    Comment: . The ADA defines abnormalities in albumin excretion as follows: Marland Kitchen Albuminuria Category  Result (mcg/mg creatinine) . Normal to Mildly increased   <30 Moderately increased         30-299  Severely increased           >  OR = 300 . The ADA recommends that at least two of three specimens collected within a 3-6 month period be abnormal before considering a patient to be within a diagnostic category.   Lipid panel     Status: None   Collection Time: 02/19/22 11:02 AM  Result Value Ref Range   Cholesterol 171 <200 mg/dL   HDL 61 > OR = 50 mg/dL   Triglycerides 75 <150 mg/dL   LDL Cholesterol (Calc) 94 mg/dL (calc)    Comment: Reference range: <100 . Desirable range <100 mg/dL for primary prevention;   <70 mg/dL for patients with CHD or diabetic patients  with > or = 2 CHD risk factors. Marland Kitchen LDL-C is now calculated using the Martin-Hopkins  calculation, which is a validated novel method providing  better accuracy than the Friedewald equation in the  estimation of LDL-C.  Cresenciano Genre et al. Annamaria Helling. 9030;092(33): 2061-2068  (http://education.[employer].com/faq/FAQ164)    Total CHOL/HDL Ratio 2.8 <5.0 (calc)   Non-HDL Cholesterol (Calc) 110 <130 mg/dL (calc)    Comment: For patients with diabetes plus 1 major ASCVD risk  factor, treating to a non-HDL-C goal of <100 mg/dL  (LDL-C of <70 mg/dL) is considered a therapeutic  option.   COMPLETE METABOLIC PANEL WITH GFR     Status: Abnormal   Collection Time: 02/19/22 11:02 AM  Result Value Ref Range   Glucose, Bld 119 (H) 65 - 99 mg/dL    Comment: .            Fasting reference interval . For someone without known diabetes, a glucose value between 100 and 125 mg/dL is consistent with prediabetes and should be confirmed with a follow-up test. .    BUN 16 7 - 25 mg/dL   Creat 0.77 0.50 - 1.03 mg/dL   eGFR 89 > OR = 60 mL/min/1.5m    Comment: The eGFR is based on the CKD-EPI 2021 equation. To calculate  the new eGFR from a previous Creatinine or Cystatin C result, go to https://www.kidney.org/professionals/ kdoqi/gfr%5Fcalculator    BUN/Creatinine Ratio NOT APPLICABLE 6 - 22 (calc)   Sodium 141 135 - 146 mmol/L   Potassium 4.4 3.5 - 5.3  mmol/L   Chloride 105 98 - 110 mmol/L   CO2 28 20 - 32 mmol/L   Calcium 9.3 8.6 - 10.4 mg/dL   Total Protein 6.6 6.1 - 8.1 g/dL   Albumin 4.0 3.6 - 5.1 g/dL   Globulin 2.6 1.9 - 3.7 g/dL (calc)   AG Ratio 1.5 1.0 - 2.5 (calc)   Total Bilirubin 0.3 0.2 - 1.2 mg/dL   Alkaline phosphatase (APISO) 82 37 - 153 U/L   AST 15 10 - 35 U/L   ALT 13 6 - 29 U/L    PHQ2/9:    05/14/2022    8:20 AM 04/16/2022    9:20 AM 02/19/2022    9:50 AM 11/18/2021   10:28 AM 09/17/2021    2:48 PM  Depression screen PHQ 2/9  Decreased Interest 1 0 1 1 1   Down, Depressed, Hopeless 1 0 1 1 1   PHQ - 2 Score 2 0 2 2 2   Altered sleeping 0 0 1 0 0  Tired, decreased energy 0 0 1 0 0  Change in appetite 0 0 1 0  0  Feeling bad or failure about yourself  1 0 1 0 0  Trouble concentrating 0 0 0 0 0  Moving slowly or fidgety/restless 0 0 0 0 0  Suicidal thoughts 0 0 0 0 0  PHQ-9 Score 3 0 6 2 2   Difficult doing work/chores  Not difficult at all Somewhat difficult Somewhat difficult Not difficult at all    phq 9 is positive   Fall Risk:    05/14/2022    8:20 AM 04/16/2022    9:18 AM 02/19/2022    9:39 AM 11/18/2021   10:28 AM 09/17/2021    2:48 PM  Fall Risk   Falls in the past year? 0 0 0 0 0  Number falls in past yr: 0 0  0 0  Injury with Fall? 0 0  0 0  Risk for fall due to : No Fall Risks   No Fall Risks   Follow up Falls prevention discussed Falls evaluation completed Falls prevention discussed Falls prevention discussed Falls evaluation completed      Functional Status Survey: Is the patient deaf or have difficulty hearing?: No Does the patient have difficulty seeing, even when wearing glasses/contacts?: Yes Does the patient have difficulty concentrating, remembering, or making decisions?: Yes Does the patient have difficulty walking or climbing stairs?: Yes Does the patient have difficulty dressing or bathing?: No Does the patient have difficulty doing errands alone such as visiting a doctor's  office or shopping?: No    Assessment & Plan  1. Dyslipidemia associated with type 2 diabetes mellitus ([place])  Continue medications  2. Diabetic peripheral neuropathy associated with type 2 diabetes mellitus ([facility])  - Insulin Degludec-Liraglutide (XULTOPHY) 100-3.6 UNIT-MG/ML SOPN; Inject 50 Units into the skin daily.  Dispense: 45 mL; Refill: 1 - metFORMIN (GLUCOPHAGE-XR) 750 MG 24 hr tablet; Take 2 tablets (1,500 mg total) by mouth daily with breakfast.  Dispense: 180 tablet; Refill: 1  3. Depression, major, recurrent, mild ([place])  Refuses SSRI Drug screen ordered today since takes alprazolam   4. Morbid obesity with BMI of 40.0-44.9, adult [facility])  Discussed with the patient the risk posed by an increased BMI. Discussed importance of portion control, calorie counting and at least 150 minutes of physical activity weekly. Avoid sweet beverages and drink more water. Eat at least 6 servings of fruit and vegetables daily    5. Hypertension associated with diabetes ([place])  - olmesartan (BENICAR) 40 MG tablet; Take 1 tablet (40 mg total) by mouth daily. In place of lisinopril for bp  Dispense: 90 tablet; Refill: 1  6. GAD (generalized anxiety disorder)  - ALPRAZolam (XANAX XR) 0.5 MG 24 hr tablet; Take 1 tablet (0.5 mg total) by mouth daily.  Dispense: 30 tablet; Refill: 2 - Drug Screen, Comprehensive, with Confirmation, Urine  7. Fibromyalgia   8. Statin myopathy   9. Primary insomnia   10. Primary osteoarthritis of both knees   11. Chronic pain disorder   Keep follow up with Ortho

## 2022-05-14 ENCOUNTER — Encounter: Payer: Self-pay | Admitting: Family Medicine

## 2022-05-14 ENCOUNTER — Ambulatory Visit (INDEPENDENT_AMBULATORY_CARE_PROVIDER_SITE_OTHER): Payer: BC Managed Care – PPO | Admitting: Family Medicine

## 2022-05-14 VITALS — BP 132/84 | HR 97 | Resp 16 | Ht 65.0 in | Wt 270.0 lb

## 2022-05-14 DIAGNOSIS — E1159 Type 2 diabetes mellitus with other circulatory complications: Secondary | ICD-10-CM

## 2022-05-14 DIAGNOSIS — F411 Generalized anxiety disorder: Secondary | ICD-10-CM | POA: Diagnosis not present

## 2022-05-14 DIAGNOSIS — E1169 Type 2 diabetes mellitus with other specified complication: Secondary | ICD-10-CM | POA: Diagnosis not present

## 2022-05-14 DIAGNOSIS — F5101 Primary insomnia: Secondary | ICD-10-CM

## 2022-05-14 DIAGNOSIS — E1142 Type 2 diabetes mellitus with diabetic polyneuropathy: Secondary | ICD-10-CM | POA: Diagnosis not present

## 2022-05-14 DIAGNOSIS — F330 Major depressive disorder, recurrent, mild: Secondary | ICD-10-CM

## 2022-05-14 DIAGNOSIS — M170 Bilateral primary osteoarthritis of knee: Secondary | ICD-10-CM

## 2022-05-14 DIAGNOSIS — Z6841 Body Mass Index (BMI) 40.0 and over, adult: Secondary | ICD-10-CM

## 2022-05-14 DIAGNOSIS — G720 Drug-induced myopathy: Secondary | ICD-10-CM

## 2022-05-14 DIAGNOSIS — G894 Chronic pain syndrome: Secondary | ICD-10-CM

## 2022-05-14 DIAGNOSIS — M797 Fibromyalgia: Secondary | ICD-10-CM

## 2022-05-14 DIAGNOSIS — E785 Hyperlipidemia, unspecified: Secondary | ICD-10-CM

## 2022-05-14 DIAGNOSIS — T466X5A Adverse effect of antihyperlipidemic and antiarteriosclerotic drugs, initial encounter: Secondary | ICD-10-CM

## 2022-05-14 DIAGNOSIS — I152 Hypertension secondary to endocrine disorders: Secondary | ICD-10-CM

## 2022-05-14 MED ORDER — XULTOPHY 100-3.6 UNIT-MG/ML ~~LOC~~ SOPN: 50.0000 [IU] | PEN_INJECTOR | Freq: Every day | SUBCUTANEOUS | 1 refills | Status: DC

## 2022-05-14 MED ORDER — METFORMIN HCL ER 750 MG PO TB24: 1500.0000 mg | ORAL_TABLET | Freq: Every day | ORAL | 1 refills | Status: DC

## 2022-05-14 MED ORDER — OLMESARTAN MEDOXOMIL 40 MG PO TABS: 40.0000 mg | ORAL_TABLET | Freq: Every day | ORAL | 1 refills | Status: DC

## 2022-05-14 MED ORDER — ALPRAZOLAM ER 0.5 MG PO TB24: 0.5000 mg | ORAL_TABLET | Freq: Every day | ORAL | 2 refills | Status: DC

## 2022-05-14 NOTE — Addendum Note (Signed)
Addended by: Carlene Coria on: 05/14/2022 09:13 AM   Modules accepted: Orders

## 2022-05-15 ENCOUNTER — Encounter: Payer: Self-pay | Admitting: Family Medicine

## 2022-05-16 ENCOUNTER — Other Ambulatory Visit: Payer: Self-pay | Admitting: Nurse Practitioner

## 2022-05-16 DIAGNOSIS — E1169 Type 2 diabetes mellitus with other specified complication: Secondary | ICD-10-CM

## 2022-05-16 MED ORDER — OMEGA-3-ACID ETHYL ESTERS 1 G PO CAPS: 1.0000 g | ORAL_CAPSULE | Freq: Two times a day (BID) | ORAL | 0 refills | Status: DC

## 2022-05-17 LAB — DRUG MONITOR, BENZO,W/CONF, URINE
Alphahydroxyalprazolam: 91 ng/mL — ABNORMAL HIGH (ref <25)
Alphahydroxymidazolam: NEGATIVE ng/mL (ref <50)
Alphahydroxytriazolam: NEGATIVE ng/mL (ref <50)
Aminoclonazepam: NEGATIVE ng/mL (ref <25)
Benzodiazepines: POSITIVE ng/mL — AB (ref <100)
Hydroxyethylflurazepam: NEGATIVE ng/mL (ref <50)
Lorazepam: NEGATIVE ng/mL (ref <50)
Nordiazepam: NEGATIVE ng/mL (ref <50)
Oxazepam: NEGATIVE ng/mL (ref <50)
Temazepam: NEGATIVE ng/mL (ref <50)

## 2022-05-17 LAB — DRUG MONITOR, MARIJUANAMETAB, W/CONF, URINE: Marijuana Metabolite: NEGATIVE ng/mL (ref <20)

## 2022-05-17 LAB — DM TEMPLATE

## 2022-05-28 ENCOUNTER — Encounter: Payer: Self-pay | Admitting: Family Medicine

## 2022-06-04 ENCOUNTER — Encounter: Payer: Self-pay | Admitting: Family Medicine

## 2022-06-11 ENCOUNTER — Encounter: Payer: Self-pay | Admitting: Family Medicine

## 2022-06-25 ENCOUNTER — Ambulatory Visit: Payer: BC Managed Care – PPO | Admitting: Family Medicine

## 2022-06-30 ENCOUNTER — Encounter: Payer: Self-pay | Admitting: Family Medicine

## 2022-07-09 ENCOUNTER — Ambulatory Visit
Admission: RE | Admit: 2022-07-09 | Discharge: 2022-07-09 | Disposition: A | Discharge status: Home / Self Care | Payer: BC Managed Care – PPO | Source: Ambulatory Visit | Attending: Family Medicine | Admitting: Family Medicine

## 2022-07-09 DIAGNOSIS — Z1231 Encounter for screening mammogram for malignant neoplasm of breast: Secondary | ICD-10-CM | POA: Diagnosis not present

## 2022-07-17 ENCOUNTER — Encounter: Payer: Self-pay | Admitting: Family Medicine

## 2022-07-24 DIAGNOSIS — M7551 Bursitis of right shoulder: Secondary | ICD-10-CM | POA: Diagnosis not present

## 2022-07-24 DIAGNOSIS — M19011 Primary osteoarthritis, right shoulder: Secondary | ICD-10-CM | POA: Diagnosis not present

## 2022-07-24 DIAGNOSIS — M7541 Impingement syndrome of right shoulder: Secondary | ICD-10-CM | POA: Diagnosis not present

## 2022-07-24 DIAGNOSIS — M778 Other enthesopathies, not elsewhere classified: Secondary | ICD-10-CM | POA: Diagnosis not present

## 2022-07-29 ENCOUNTER — Other Ambulatory Visit: Payer: Self-pay | Admitting: Family Medicine

## 2022-07-31 ENCOUNTER — Telehealth: Payer: Self-pay | Admitting: Family Medicine

## 2022-07-31 ENCOUNTER — Ambulatory Visit: Payer: Self-pay

## 2022-07-31 NOTE — Telephone Encounter (Signed)
Summary: leg muscle cramps   Patient states she has been experiencing muscle cramps in her legs x1d   Please fu w/ patient     Called pt and LM on VM to call back to discuss sx. Call back number provided.

## 2022-07-31 NOTE — Telephone Encounter (Signed)
Patient called, left VM to return the call to the office to discuss symptoms with a nurse.  Summary: leg muscle cramps   Patient states she has been experiencing muscle cramps in her legs x1d   Please fu w/ patient

## 2022-07-31 NOTE — Telephone Encounter (Signed)
Pls be advised of 2 Triage Notes by 2 separate nurses today. Pt was wanting a call back from [facility]. Pt wanted me to send Dr Ancil Boozer another message. [phone]

## 2022-07-31 NOTE — Telephone Encounter (Signed)
     Chief Complaint: Both legs cramping, mainly when laying down. Has appointment 08/15/22. Declines to come in earlier. Symptoms: Muscle cramps Frequency: 2 days Pertinent Negatives: Patient denies  Disposition: '[]'$ ED /'[]'$ Urgent Care (no appt availability in office) / '[]'$ Appointment(In office/virtual)/ '[]'$  [place] [facility]/ '[]'$ Home Care/ '[]'$ Refused Recommended Disposition /'[]'$  Mobile Bus/ '[x]'$  Follow-up with PCP Additional Notes: Asking what she can do for cramps prior to appointment.  Answer Assessment - Initial Assessment Questions 1. ONSET: "When did the pain start?"      Last night 2. LOCATION: "Where is the pain located?"      Both legs 3. PAIN: "How bad is the pain?"    (Scale 1-10; or mild, moderate, severe)   -  MILD (1-3): doesn't interfere with normal activities    -  MODERATE (4-7): interferes with normal activities (e.g., work or school) or awakens from sleep, limping    -  SEVERE (8-10): excruciating pain, unable to do any normal activities, unable to walk     Cramping 4. WORK OR EXERCISE: "Has there been any recent work or exercise that involved this part of the body?"      No 5. CAUSE: "What do you think is causing the leg pain?"     Unsure 6. OTHER SYMPTOMS: "Do you have any other symptoms?" (e.g., chest pain, back pain, breathing difficulty, swelling, rash, fever, numbness, weakness)     No 7. PREGNANCY: "Is there any chance you are pregnant?" "When was your last menstrual period?"     No  Protocols used: Leg Pain-A-AH

## 2022-08-01 NOTE — Telephone Encounter (Signed)
Called and left vm that Skelaxin was sent in and if she wasn't taking it she needs to start. I let her know if she has been taking it with no improvement she needs to be seen sooner. Callback number left.

## 2022-08-12 ENCOUNTER — Encounter: Payer: Self-pay | Admitting: Family Medicine

## 2022-08-13 ENCOUNTER — Encounter: Payer: Self-pay | Admitting: Family Medicine

## 2022-08-13 ENCOUNTER — Ambulatory Visit: Payer: BC Managed Care – PPO | Admitting: Family Medicine

## 2022-08-13 ENCOUNTER — Ambulatory Visit (INDEPENDENT_AMBULATORY_CARE_PROVIDER_SITE_OTHER): Payer: BC Managed Care – PPO | Admitting: Family Medicine

## 2022-08-13 VITALS — BP 128/82 | HR 93 | Resp 16 | Ht 64.0 in | Wt 274.0 lb

## 2022-08-13 DIAGNOSIS — M542 Cervicalgia: Secondary | ICD-10-CM

## 2022-08-13 DIAGNOSIS — G4762 Sleep related leg cramps: Secondary | ICD-10-CM

## 2022-08-13 DIAGNOSIS — E1169 Type 2 diabetes mellitus with other specified complication: Secondary | ICD-10-CM

## 2022-08-13 DIAGNOSIS — E785 Hyperlipidemia, unspecified: Secondary | ICD-10-CM | POA: Diagnosis not present

## 2022-08-13 DIAGNOSIS — F411 Generalized anxiety disorder: Secondary | ICD-10-CM | POA: Diagnosis not present

## 2022-08-13 DIAGNOSIS — E1159 Type 2 diabetes mellitus with other circulatory complications: Secondary | ICD-10-CM

## 2022-08-13 DIAGNOSIS — M797 Fibromyalgia: Secondary | ICD-10-CM

## 2022-08-13 DIAGNOSIS — F330 Major depressive disorder, recurrent, mild: Secondary | ICD-10-CM

## 2022-08-13 DIAGNOSIS — E1142 Type 2 diabetes mellitus with diabetic polyneuropathy: Secondary | ICD-10-CM

## 2022-08-13 DIAGNOSIS — I152 Hypertension secondary to endocrine disorders: Secondary | ICD-10-CM

## 2022-08-13 LAB — POCT GLYCOSYLATED HEMOGLOBIN (HGB A1C): Hemoglobin A1C: 6.7 % — AB (ref 4.0–5.6)

## 2022-08-13 MED ORDER — PREGABALIN 50 MG PO CAPS
50.0000 mg | ORAL_CAPSULE | Freq: Every evening | ORAL | 0 refills | Status: DC
Start: 2022-08-13 — End: 2023-07-20

## 2022-08-13 NOTE — Progress Notes (Signed)
Name: Lindsay Mcdonald   MRN: [id number]    DOB: [birth year]   Date:08/13/2022       Progress Note  Subjective  Chief Complaint  Follow Up  HPI  Mild Major Depression/insomnia: she is afraid of taking medications, explained again that SSRI is safer than BZD.   She has been on alprazolam XR and is afraid to stop taking medication. Phq 9 is still above goal   Denies suicidal thoughts or ideation. She is now living with her daughter due to financial difficulties and sleeping in a sofa .She saw a counselor for a few weeks but did not go back because it was in [place] . She worries about her mother , also stress within her family   OA: seeing Dr. 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 Unchanged   FMS: she has a long history of feeling aching and tender worse on trunk and upper extremities. Pain on shoulders, arms, stiff on her hips . Feels a little stiff when first gets up, she has some mental fogginess, she has tried Lyrica and Duloxetine but stopped on her own. She is taking Tylenol arthritis , she stopped taking Ibuprofen since Dec 2022 , however she took multiple doses since last night due to neck pain    DMII:glucose lately has been elevated due to prednisone  A1C went from 6.6 % to 7.9% to 8.1% ,6.7 %, 7 % 6.6 % , 7.1 % ,8.1 % to 7.1 %, 7.4 % and today is down to 6.7 %  She has been compliant with Xultophy ( sometimes skips by accident)  and also taking Metformin 1500 mg daily No polyphagia, polydipsia or polyuria. She states occasional has tingling on her toes .She also has associated HTN, dyslipidemia, neuropathy and obesity.    OSA: she has not been back for titration yet, she states not having problems lately, used to gasp for air  HTN: she is now on Benicar 40 mg, no  chest pain or palpation . BP is at goal, no side effects of medication. Continue medication     Hyperlipidemia: she has tried multiple statins and zetia but unable to tolerate due to severe muscle aches. 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 Pain was sharp and shooting down left elbow, it was affecting her sleep, she saw Dr. Rudene Christians and he recommend evaluation by neurosurgeon and PT, however in the mean time she saw Dr. Candelaria Stagers since and steroid injections on shoulder improved symptoms Today she came in with a different type of neck pain. It started yesterday morning when she was going to work, pain is described as aching , intermittent, happens with certain movements, it can go to 10/10. It is brief but happens multiple times a day. Tried nsaid's and muscle relaxer without help. No radiculitis   Nocturnal leg cramps:  she states happening more often lately, feels like crawling on legs and sometimes cramps, has to get up and walk   Patient Active Problem  List   Diagnosis Date Noted   Type 2 diabetes mellitus ([place]) 05/21/2021   Hypertension associated with diabetes ([place]) 08/31/2020   History of marijuana use 03/30/2019   Fibromyalgia 12/31/2018   Venous vascular malformations 03/27/2016   Non compliance w medication regimen 09/26/2015   Seasonal allergic rhinitis 09/26/2015   Muscle spasms of neck 06/19/2015   Insomnia 06/19/2015   Primary osteoarthritis of right knee 06/17/2015   Depression, major, recurrent, mild ([place]) 06/17/2015   Hyperlipidemia 06/17/2015   Benign hypertension 06/17/2015   Morbid obesity with BMI of 40.0-44.9, adult ([place]) 06/17/2015   Vitamin D deficiency 06/17/2015    Past Surgical History:   Procedure Laterality Date   BREAST REDUCTION SURGERY     ETHMOIDECTOMY Right 05/16/2020   Procedure: ETHMOIDECTOMY;  Surgeon: Margaretha Sheffield, MD;  Location: [facility];  Service: ENT;  Laterality: Right;   EXCISION ORAL TUMOR N/A 05/16/2020   Procedure: EXCISION OF SOFT PALATE PAPILLOMA;  Surgeon: Margaretha Sheffield, MD;  Location: [facility];  Service: ENT;  Laterality: N/A;   FRONTAL SINUS EXPLORATION Right 05/16/2020   Procedure: FRONTAL SINUS EXPLORATION;  Surgeon: Margaretha Sheffield, MD;  Location: [facility];  Service: ENT;  Laterality: Right;   IMAGE GUIDED SINUS SURGERY N/A 05/16/2020   Procedure: IMAGE GUIDED SINUS SURGERY;  Surgeon: Margaretha Sheffield, MD;  Location: [facility];  Service: ENT;  Laterality: N/A;   MAXILLARY ANTROSTOMY Bilateral 05/16/2020   Procedure: MAXILLARY ANTROSTOMY with tissue removal;  Surgeon: Margaretha Sheffield, MD;  Location: [facility];  Service: ENT;  Laterality: Bilateral;   nasal endoscopic     OOPHORECTOMY  ?   one ovary removed    REDUCTION MAMMAPLASTY Bilateral 2000   removal of ovary     SPHENOIDECTOMY Right 05/16/2020   Procedure: SPHENOIDECTOMY;  Surgeon: Margaretha Sheffield, MD;  Location: [facility];  Service: ENT;  Laterality: Right;    Family History  Problem Relation Age of Onset   Diabetes Mother    Cancer Mother 45       Breast   Breast cancer Mother 7   Gout Mother    Diabetes Father    Diabetes Daughter        Oldest Daughter   Breast cancer Maternal Aunt 55   Kidney cancer Maternal Aunt    Kidney disease Maternal Aunt    Stroke Maternal Uncle    Cancer Half-Sister 52       breast cancer   Prostate cancer Neg Hx    Bladder Cancer Neg Hx     Social History   Tobacco Use   Smoking status: Never   Smokeless tobacco: Never  Substance Use Topics   Alcohol use: No    Alcohol/week: 0.0 standard drinks of alcohol     Current Outpatient Medications:    albuterol (VENTOLIN HFA) 108 (90 Base) MCG/ACT inhaler, Inhale 2 puffs into the lungs every 6 (six) hours as  needed for wheezing or shortness of breath., Disp: 8 g, Rfl: 2   ALPRAZolam (XANAX XR) 0.5 MG 24 hr tablet, Take 1 tablet (0.5 mg total) by mouth daily., Disp: 30 tablet, Rfl: 2   aspirin EC 81 MG tablet, Take 81 mg by mouth daily. Swallow whole., Disp: , Rfl:    fluticasone (FLONASE) 50 MCG/ACT nasal spray, Place 2 sprays into both nostrils daily as needed for allergies or rhinitis., Disp: , Rfl:    Insulin Degludec-Liraglutide (XULTOPHY) 100-3.6 UNIT-MG/ML SOPN, Inject 50 Units into the skin daily., Disp: 45 mL, Rfl: 1   Melatonin  10 MG TABS, Take 10 mg by mouth at bedtime as needed (sleep). , Disp: , Rfl:    metaxalone (SKELAXIN) 800 MG tablet, Take 1 tablet by mouth three times daily as needed for muscle spasm, Disp: 90 tablet, Rfl: 0   metFORMIN (GLUCOPHAGE-XR) 750 MG 24 hr tablet, Take 2 tablets (1,500 mg total) by mouth daily with breakfast., Disp: 180 tablet, Rfl: 1   olmesartan (BENICAR) 40 MG tablet, Take 1 tablet (40 mg total) by mouth daily. In place of lisinopril for bp, Disp: 90 tablet, Rfl: 1   omega-3 acid ethyl esters (LOVAZA) 1 g capsule, Take 1 capsule (1 g total) by mouth 2 (two) times daily. (Patient not taking: Reported on 08/13/2022), Disp: 90 capsule, Rfl: 0  Allergies  Allergen Reactions   Simvastatin     Muscle cramps    Zetia [Ezetimibe] Other (See Comments)    Myalgia    Atorvastatin Other (See Comments)    muscle cramps   Compazine Other (See Comments)    Twitching and spasms of neck muscles   Penicillins Rash    Reaction under 25, patient unsure that she still has an allergy    I personally reviewed active problem list, medication list, allergies, family history, social history, health maintenance with the patient/caregiver today.   ROS  Constitutional: Negative for fever or weight change.  Respiratory: Negative for cough and shortness of breath.   Cardiovascular: Negative for chest pain or palpitations.  Gastrointestinal: Negative for abdominal pain, no  bowel changes.  Musculoskeletal: Negative for gait problem or joint swelling.  Skin: Negative for rash.  Neurological: Negative for dizziness or headache.  No other specific complaints in a complete review of systems (except as listed in HPI above).   Objective  Vitals:   08/13/22 1434  BP: 128/82  Pulse: 93  Resp: 16  SpO2: 98%  Weight: 274 lb (124.3 kg)  Height: '5\' 4"'$  (1.626 m)    Body mass index is 47.03 kg/m.  Physical Exam  Constitutional: Patient appears well-developed and well-nourished. Obese  No distress.  HEENT: head atraumatic, normocephalic, pupils equal and reactive to light, neck supple Cardiovascular: Normal rate, regular rhythm and normal heart sounds.  No murmur heard. No BLE edema. Pulmonary/Chest: Effort normal and breath sounds normal. No respiratory distress. Abdominal: Soft.  There is no tenderness. Psychiatric: Patient has a normal mood and affect. behavior is normal. Judgment and thought content normal.   Recent Results (from the past 2160 hour(s))  POCT HgB A1C     Status: Abnormal   Collection Time: 08/13/22  2:34 PM  Result Value Ref Range   Hemoglobin A1C 6.7 (A) 4.0 - 5.6 %   HbA1c POC (<> result, manual entry)     HbA1c, POC (prediabetic range)     HbA1c, POC (controlled diabetic range)       PHQ2/9:    08/13/2022    2:33 PM 05/14/2022    8:20 AM 04/16/2022    9:20 AM 02/19/2022    9:50 AM 11/18/2021   10:28 AM  Depression screen PHQ 2/9  Decreased Interest 1 1 0 1 1  Down, Depressed, Hopeless 1 1 0 1 1  PHQ - 2 Score 2 2 0 2 2  Altered sleeping 3 0 0 1 0  Tired, decreased energy 0 0 0 1 0  Change in appetite 3 0 0 1 0  Feeling bad or failure about yourself  3 1 0 1 0  Trouble concentrating 0 0 0 0  0  Moving slowly or fidgety/restless 0 0 0 0 0  Suicidal thoughts 0 0 0 0 0  PHQ-9 Score 11 3 0 6 2  Difficult doing work/chores   Not difficult at all Somewhat difficult Somewhat difficult    phq 9 is positive   Fall Risk:     08/13/2022    2:33 PM 05/14/2022    8:20 AM 04/16/2022    9:18 AM 02/19/2022    9:39 AM 11/18/2021   10:28 AM  Fall Risk   Falls in the past year? 0 0 0 0 0  Number falls in past yr: 0 0 0  0  Injury with Fall? 0 0 0  0  Risk for fall due to : No Fall Risks No Fall Risks   No Fall Risks  Follow up Falls prevention discussed Falls prevention discussed Falls evaluation completed Falls prevention discussed Falls prevention discussed      Functional Status Survey: Is the patient deaf or have difficulty hearing?: No Does the patient have difficulty seeing, even when wearing glasses/contacts?: Yes Does the patient have difficulty concentrating, remembering, or making decisions?: Yes Does the patient have difficulty walking or climbing stairs?: No Does the patient have difficulty dressing or bathing?: No Does the patient have difficulty doing errands alone such as visiting a doctor's office or shopping?: No    Assessment & Plan  1. Dyslipidemia associated with type 2 diabetes mellitus (HCC)  - POCT HgB A1C  2. GAD (generalized anxiety disorder)   3. Nocturnal leg cramps  - Ferritin - Magnesium - COMPLETE METABOLIC PANEL WITH GFR - CBC with Differential/Platelet - pregabalin (LYRICA) 50 MG capsule; Take 1-3 capsules (50-150 mg total) by mouth at bedtime.  Dispense: 90 capsule; Refill: 0  4. Acute neck pain  We will try Lyrica, discussed essentially normal exam today, no radiculitis, prednisone would raise glucose, stop taking nsaid's since not helping, discussed referral to chiropractor but she is afraid   5. Fibromyalgia  - pregabalin (LYRICA) 50 MG capsule; Take 1-3 capsules (50-150 mg total) by mouth at bedtime.  Dispense: 90 capsule; Refill: 0  6. Diabetic peripheral neuropathy associated with type 2 diabetes mellitus ([place])   7. Depression, major, recurrent, mild (HCC)  Refuses SSRI  8. Hypertension associated with diabetes ([place])

## 2022-08-14 ENCOUNTER — Encounter: Payer: Self-pay | Admitting: Family Medicine

## 2022-08-14 ENCOUNTER — Other Ambulatory Visit: Payer: Self-pay | Admitting: Family Medicine

## 2022-08-14 DIAGNOSIS — M542 Cervicalgia: Secondary | ICD-10-CM

## 2022-08-14 LAB — COMPLETE METABOLIC PANEL WITH GFR
AG Ratio: 1.4 (calc) (ref 1.0–2.5)
ALT: 21 U/L (ref 6–29)
AST: 21 U/L (ref 10–35)
Albumin: 3.7 g/dL (ref 3.6–5.1)
Alkaline phosphatase (APISO): 78 U/L (ref 37–153)
BUN: 18 mg/dL (ref 7–25)
CO2: 26 mmol/L (ref 20–32)
Calcium: 9.1 mg/dL (ref 8.6–10.4)
Chloride: 110 mmol/L (ref 98–110)
Creat: 0.9 mg/dL (ref 0.50–1.03)
Globulin: 2.7 g/dL (calc) (ref 1.9–3.7)
Glucose, Bld: 149 mg/dL — ABNORMAL HIGH (ref 65–99)
Potassium: 4.2 mmol/L (ref 3.5–5.3)
Sodium: 144 mmol/L (ref 135–146)
Total Bilirubin: 0.3 mg/dL (ref 0.2–1.2)
Total Protein: 6.4 g/dL (ref 6.1–8.1)
eGFR: 74 mL/min/{1.73_m2} (ref >=60)

## 2022-08-14 LAB — CBC WITH DIFFERENTIAL/PLATELET
Absolute Monocytes: 539 cells/uL (ref 200–950)
Basophils Absolute: 78 cells/uL (ref 0–200)
Basophils Relative: 0.9 %
Eosinophils Absolute: 322 cells/uL (ref 15–500)
Eosinophils Relative: 3.7 %
HCT: 37.9 % (ref 35.0–45.0)
Hemoglobin: 12.5 g/dL (ref 11.7–15.5)
Lymphs Abs: 2149 cells/uL (ref 850–3900)
MCH: 26.2 pg — ABNORMAL LOW (ref 27.0–33.0)
MCHC: 33 g/dL (ref 32.0–36.0)
MCV: 79.3 fL — ABNORMAL LOW (ref 80.0–100.0)
MPV: 11.7 fL (ref 7.5–12.5)
Monocytes Relative: 6.2 %
Neutro Abs: 5612 cells/uL (ref 1500–7800)
Neutrophils Relative %: 64.5 %
Platelets: 276 10*3/uL (ref 140–400)
RBC: 4.78 10*6/uL (ref 3.80–5.10)
RDW: 13.2 % (ref 11.0–15.0)
Total Lymphocyte: 24.7 %
WBC: 8.7 10*3/uL (ref 3.8–10.8)

## 2022-08-14 LAB — MAGNESIUM: Magnesium: 1.9 mg/dL (ref 1.5–2.5)

## 2022-08-14 LAB — FERRITIN: Ferritin: 48 ng/mL (ref 16–232)

## 2022-08-14 MED ORDER — PREDNISONE 10 MG PO TABS: 10.0000 mg | ORAL_TABLET | Freq: Two times a day (BID) | ORAL | 0 refills | Status: DC

## 2022-08-15 ENCOUNTER — Encounter: Payer: Self-pay | Admitting: Family Medicine

## 2022-08-15 ENCOUNTER — Ambulatory Visit: Payer: BC Managed Care – PPO | Admitting: Family Medicine

## 2022-08-15 VITALS — BP 138/86 | HR 96 | Resp 16 | Ht 64.0 in | Wt 274.0 lb

## 2022-08-15 DIAGNOSIS — M797 Fibromyalgia: Secondary | ICD-10-CM | POA: Diagnosis not present

## 2022-08-15 NOTE — Progress Notes (Signed)
Name: Lindsay Mcdonald   MRN: [id number]    DOB: Jul 19, 1961   Date:08/15/2022       Progress Note  Subjective  Chief Complaint  FMLA  HPI  FMS: she has a long history of feeling aching and tender worse on trunk and upper extremities. Pain on shoulders, arms, stiff on her hips .Stiffiness is worse when she first gets up in the mornings, she has some mental fogginess, she has tried Lyrica and Duloxetine but stopped on her own, she has intense pain intermittently and worsening of mental fogginess that could affect her job and asked me to fill out FMLA forms. She is willing to try Lyrica again We will give her excuse for work for regular office visits and two hours to one day off work every 3 weeks prn increase FMS during a flare   Neck pain: seen this week, feeling better since she took prednisone   Patient Active Problem List   Diagnosis Date Noted   Type 2 diabetes mellitus ([place]) 05/21/2021   Hypertension associated with diabetes ([place]) 08/31/2020   History of marijuana use 03/30/2019   Fibromyalgia 12/31/2018   Venous vascular malformations 03/27/2016   Non compliance w medication regimen 09/26/2015   Seasonal allergic rhinitis 09/26/2015   Muscle spasms of neck 06/19/2015   Insomnia 06/19/2015   Primary osteoarthritis of right knee 06/17/2015   Depression, major, recurrent, mild ([place]) 06/17/2015   Hyperlipidemia 06/17/2015   Benign hypertension 06/17/2015   Morbid obesity with BMI of 40.0-44.9, adult ([place]) 06/17/2015   Vitamin D deficiency 06/17/2015    Past Surgical History:  Procedure Laterality Date   BREAST REDUCTION SURGERY     ETHMOIDECTOMY Right 05/16/2020   Procedure: ETHMOIDECTOMY;  Surgeon: Margaretha Sheffield, MD;  Location: [facility];  Service: ENT;  Laterality: Right;   EXCISION ORAL TUMOR N/A 05/16/2020   Procedure: EXCISION OF SOFT PALATE PAPILLOMA;  Surgeon: Margaretha Sheffield, MD;  Location: [facility];  Service: ENT;  Laterality: N/A;   FRONTAL SINUS EXPLORATION Right  05/16/2020   Procedure: FRONTAL SINUS EXPLORATION;  Surgeon: Margaretha Sheffield, MD;  Location: [facility];  Service: ENT;  Laterality: Right;   IMAGE GUIDED SINUS SURGERY N/A 05/16/2020   Procedure: IMAGE GUIDED SINUS SURGERY;  Surgeon: Margaretha Sheffield, MD;  Location: [facility];  Service: ENT;  Laterality: N/A;   MAXILLARY ANTROSTOMY Bilateral 05/16/2020   Procedure: MAXILLARY ANTROSTOMY with tissue removal;  Surgeon: Margaretha Sheffield, MD;  Location: [facility];  Service: ENT;  Laterality: Bilateral;   nasal endoscopic     OOPHORECTOMY  ?   one ovary removed    REDUCTION MAMMAPLASTY Bilateral 2000   removal of ovary     SPHENOIDECTOMY Right 05/16/2020   Procedure: SPHENOIDECTOMY;  Surgeon: Margaretha Sheffield, MD;  Location: [facility];  Service: ENT;  Laterality: Right;    Family History  Problem Relation Age of Onset   Diabetes Mother    Cancer Mother 92       Breast   Breast cancer Mother 65   Gout Mother    Diabetes Father    Diabetes Daughter        Oldest Daughter   Breast cancer Maternal Aunt 55   Kidney cancer Maternal Aunt    Kidney disease Maternal Aunt    Stroke Maternal Uncle    Cancer Half-Sister 82       breast cancer   Prostate cancer Neg Hx    Bladder Cancer Neg Hx     Social History   Tobacco Use  Smoking status: Never   Smokeless tobacco: Never  Substance Use Topics   Alcohol use: No    Alcohol/week: 0.0 standard drinks of alcohol     Current Outpatient Medications:    albuterol (VENTOLIN HFA) 108 (90 Base) MCG/ACT inhaler, Inhale 2 puffs into the lungs every 6 (six) hours as needed for wheezing or shortness of breath., Disp: 8 g, Rfl: 2   ALPRAZolam (XANAX XR) 0.5 MG 24 hr tablet, Take 1 tablet (0.5 mg total) by mouth daily., Disp: 30 tablet, Rfl: 2   aspirin EC 81 MG tablet, Take 81 mg by mouth daily. Swallow whole., Disp: , Rfl:    fluticasone (FLONASE) 50 MCG/ACT nasal spray, Place 2 sprays into both nostrils daily as needed for allergies or rhinitis., Disp: , Rfl:     Insulin Degludec-Liraglutide (XULTOPHY) 100-3.6 UNIT-MG/ML SOPN, Inject 50 Units into the skin daily., Disp: 45 mL, Rfl: 1   Melatonin 10 MG TABS, Take 10 mg by mouth at bedtime as needed (sleep). , Disp: , Rfl:    metaxalone (SKELAXIN) 800 MG tablet, Take 1 tablet by mouth three times daily as needed for muscle spasm, Disp: 90 tablet, Rfl: 0   metFORMIN (GLUCOPHAGE-XR) 750 MG 24 hr tablet, Take 2 tablets (1,500 mg total) by mouth daily with breakfast., Disp: 180 tablet, Rfl: 1   olmesartan (BENICAR) 40 MG tablet, Take 1 tablet (40 mg total) by mouth daily. In place of lisinopril for bp, Disp: 90 tablet, Rfl: 1   omega-3 acid ethyl esters (LOVAZA) 1 g capsule, Take 1 capsule (1 g total) by mouth 2 (two) times daily., Disp: 90 capsule, Rfl: 0   predniSONE (DELTASONE) 10 MG tablet, Take 1 tablet (10 mg total) by mouth 2 (two) times daily with a meal. You cannot take aleve, motrin or any other otc except for tylenol, Disp: 10 tablet, Rfl: 0   pregabalin (LYRICA) 50 MG capsule, Take 1-3 capsules (50-150 mg total) by mouth at bedtime., Disp: 90 capsule, Rfl: 0  Allergies  Allergen Reactions   Simvastatin     Muscle cramps    Zetia [Ezetimibe] Other (See Comments)    Myalgia    Atorvastatin Other (See Comments)    muscle cramps   Compazine Other (See Comments)    Twitching and spasms of neck muscles   Penicillins Rash    Reaction under 25, patient unsure that she still has an allergy    I personally reviewed active problem list, medication list, allergies, family history, social history, health maintenance with the patient/caregiver today.   ROS  Ten systems reviewed and is negative except as mentioned in HPI   Objective  Vitals:   08/15/22 0948  BP: 138/86  Pulse: 96  Resp: 16  SpO2: 97%  Weight: 274 lb (124.3 kg)  Height: 5' 4"  (1.626 m)    Body mass index is 47.03 kg/m.  Physical Exam  Constitutional: Patient appears well-developed and well-nourished. Obese  No distress.   HEENT: head atraumatic, normocephalic, pupils equal and reactive to light, neck supple Cardiovascular: Normal rate, regular rhythm and normal heart sounds.  No murmur heard. No BLE edema. Pulmonary/Chest: Effort normal and breath sounds normal. No respiratory distress. Abdominal: Soft.  There is no tenderness. Muscular skeletal: Trigger point positive  Psychiatric: Patient has a normal mood and affect. behavior is normal. Judgment and thought content normal.   Recent Results (from the past 2160 hour(s))  POCT HgB A1C     Status: Abnormal   Collection Time: 08/13/22  2:34 PM  Result Value Ref Range   Hemoglobin A1C 6.7 (A) 4.0 - 5.6 %   HbA1c POC (<> result, manual entry)     HbA1c, POC (prediabetic range)     HbA1c, POC (controlled diabetic range)    Ferritin     Status: None   Collection Time: 08/13/22  3:10 PM  Result Value Ref Range   Ferritin 48 16 - 232 ng/mL  Magnesium     Status: None   Collection Time: 08/13/22  3:10 PM  Result Value Ref Range   Magnesium 1.9 1.5 - 2.5 mg/dL  COMPLETE METABOLIC PANEL WITH GFR     Status: Abnormal   Collection Time: 08/13/22  3:10 PM  Result Value Ref Range   Glucose, Bld 149 (H) 65 - 99 mg/dL    Comment: .            Fasting reference interval . For someone without known diabetes, a glucose value >125 mg/dL indicates that they may have diabetes and this should be confirmed with a follow-up test. .    BUN 18 7 - 25 mg/dL   Creat 0.90 0.50 - 1.03 mg/dL   eGFR 74 > OR = 60 mL/min/1.8m   BUN/Creatinine Ratio SEE NOTE: 6 - 22 (calc)    Comment:    Not Reported: BUN and Creatinine are within    reference range. .    Sodium 144 135 - 146 mmol/L   Potassium 4.2 3.5 - 5.3 mmol/L   Chloride 110 98 - 110 mmol/L   CO2 26 20 - 32 mmol/L   Calcium 9.1 8.6 - 10.4 mg/dL   Total Protein 6.4 6.1 - 8.1 g/dL   Albumin 3.7 3.6 - 5.1 g/dL   Globulin 2.7 1.9 - 3.7 g/dL (calc)   AG Ratio 1.4 1.0 - 2.5 (calc)   Total Bilirubin 0.3 0.2 - 1.2  mg/dL   Alkaline phosphatase (APISO) 78 37 - 153 U/L   AST 21 10 - 35 U/L   ALT 21 6 - 29 U/L  CBC with Differential/Platelet     Status: Abnormal   Collection Time: 08/13/22  3:10 PM  Result Value Ref Range   WBC 8.7 3.8 - 10.8 Thousand/uL   RBC 4.78 3.80 - 5.10 Million/uL   Hemoglobin 12.5 11.7 - 15.5 g/dL   HCT 37.9 35.0 - 45.0 %   MCV 79.3 (L) 80.0 - 100.0 fL   MCH 26.2 (L) 27.0 - 33.0 pg   MCHC 33.0 32.0 - 36.0 g/dL   RDW 13.2 11.0 - 15.0 %   Platelets 276 140 - 400 Thousand/uL   MPV 11.7 7.5 - 12.5 fL   Neutro Abs 5,612 1,500 - 7,800 cells/uL   Lymphs Abs 2,149 850 - 3,900 cells/uL   Absolute Monocytes 539 200 - 950 cells/uL   Eosinophils Absolute 322 15 - 500 cells/uL   Basophils Absolute 78 0 - 200 cells/uL   Neutrophils Relative % 64.5 %   Total Lymphocyte 24.7 %   Monocytes Relative 6.2 %   Eosinophils Relative 3.7 %   Basophils Relative 0.9 %    PHQ2/9:    08/13/2022    2:33 PM 05/14/2022    8:20 AM 04/16/2022    9:20 AM 02/19/2022    9:50 AM 11/18/2021   10:28 AM  Depression screen PHQ 2/9  Decreased Interest 1 1 0 1 1  Down, Depressed, Hopeless 1 1 0 1 1  PHQ - 2 Score 2 2 0 2 2  Altered sleeping 3 0 0 1 0  Tired, decreased energy 0 0 0 1 0  Change in appetite 3 0 0 1 0  Feeling bad or failure about yourself  3 1 0 1 0  Trouble concentrating 0 0 0 0 0  Moving slowly or fidgety/restless 0 0 0 0 0  Suicidal thoughts 0 0 0 0 0  PHQ-9 Score 11 3 0 6 2  Difficult doing work/chores   Not difficult at all Somewhat difficult Somewhat difficult    phq 9 is positive   Fall Risk:    08/15/2022    9:47 AM 08/13/2022    2:33 PM 05/14/2022    8:20 AM 04/16/2022    9:18 AM 02/19/2022    9:39 AM  Fall Risk   Falls in the past year? 0 0 0 0 0  Number falls in past yr: 0 0 0 0   Injury with Fall? 0 0 0 0   Risk for fall due to : No Fall Risks No Fall Risks No Fall Risks    Follow up Falls prevention discussed Falls prevention discussed Falls prevention discussed  Falls evaluation completed Falls prevention discussed      Functional Status Survey: Is the patient deaf or have difficulty hearing?: No Does the patient have difficulty seeing, even when wearing glasses/contacts?: Yes Does the patient have difficulty concentrating, remembering, or making decisions?: Yes Does the patient have difficulty walking or climbing stairs?: No Does the patient have difficulty dressing or bathing?: No Does the patient have difficulty doing errands alone such as visiting a doctor's office or shopping?: No    Assessment & Plan  1. Fibromyalgia  FMLA forms filled out

## 2022-08-31 ENCOUNTER — Encounter: Payer: Self-pay | Admitting: Family Medicine

## 2022-09-01 ENCOUNTER — Other Ambulatory Visit: Payer: Self-pay | Admitting: Family Medicine

## 2022-09-01 DIAGNOSIS — G4762 Sleep related leg cramps: Secondary | ICD-10-CM

## 2022-09-02 ENCOUNTER — Other Ambulatory Visit: Payer: Self-pay | Admitting: Family Medicine

## 2022-09-02 DIAGNOSIS — G4762 Sleep related leg cramps: Secondary | ICD-10-CM

## 2022-09-09 ENCOUNTER — Other Ambulatory Visit: Payer: Self-pay | Admitting: Family Medicine

## 2022-09-09 ENCOUNTER — Encounter: Payer: Self-pay | Admitting: Family Medicine

## 2022-09-09 DIAGNOSIS — F411 Generalized anxiety disorder: Secondary | ICD-10-CM

## 2022-09-09 MED ORDER — ALPRAZOLAM ER 0.5 MG PO TB24: 0.5000 mg | ORAL_TABLET | Freq: Every day | ORAL | 2 refills | Status: DC

## 2022-09-11 ENCOUNTER — Other Ambulatory Visit: Payer: Self-pay | Admitting: Family Medicine

## 2022-09-12 ENCOUNTER — Other Ambulatory Visit: Payer: Self-pay | Admitting: Family Medicine

## 2022-09-12 DIAGNOSIS — G4762 Sleep related leg cramps: Secondary | ICD-10-CM

## 2022-09-12 MED ORDER — BACLOFEN 10 MG PO TABS: 10.0000 mg | ORAL_TABLET | Freq: Every day | ORAL | 0 refills | Status: DC

## 2022-09-29 ENCOUNTER — Encounter: Payer: Self-pay | Admitting: Family Medicine

## 2022-10-06 ENCOUNTER — Telehealth: Payer: Self-pay

## 2022-10-06 NOTE — Patient Outreach (Signed)
  Care Coordination   10/06/2022 Name: Lindsay Mcdonald MRN: [id number] DOB: 1961-05-03   Care Coordination Outreach Attempts:  An unsuccessful telephone outreach was attempted today to offer the patient information about available care coordination services as a benefit of their health plan.   Follow Up Plan:  Additional outreach attempts will be made to offer the patient care coordination information and services.   Encounter Outcome:  No Answer  Care Coordination Interventions Activated:  No   Care Coordination Interventions:  No, not indicated    Noreene Larsson RN, MSN, [facility]  Mobile: [phone]

## 2022-10-22 ENCOUNTER — Other Ambulatory Visit: Payer: BC Managed Care – PPO | Admitting: Urology

## 2022-10-25 ENCOUNTER — Encounter: Payer: Self-pay | Admitting: Family Medicine

## 2022-10-31 DIAGNOSIS — M19011 Primary osteoarthritis, right shoulder: Secondary | ICD-10-CM | POA: Diagnosis not present

## 2022-10-31 DIAGNOSIS — M7541 Impingement syndrome of right shoulder: Secondary | ICD-10-CM | POA: Diagnosis not present

## 2022-10-31 DIAGNOSIS — M75121 Complete rotator cuff tear or rupture of right shoulder, not specified as traumatic: Secondary | ICD-10-CM | POA: Diagnosis not present

## 2022-10-31 DIAGNOSIS — M25511 Pain in right shoulder: Secondary | ICD-10-CM | POA: Diagnosis not present

## 2022-10-31 DIAGNOSIS — M7551 Bursitis of right shoulder: Secondary | ICD-10-CM | POA: Diagnosis not present

## 2022-11-03 ENCOUNTER — Other Ambulatory Visit: Payer: Self-pay | Admitting: Family Medicine

## 2022-11-03 ENCOUNTER — Encounter: Payer: Self-pay | Admitting: Family Medicine

## 2022-11-03 DIAGNOSIS — G4762 Sleep related leg cramps: Secondary | ICD-10-CM

## 2022-11-04 ENCOUNTER — Other Ambulatory Visit: Payer: Self-pay | Admitting: Family Medicine

## 2022-11-04 DIAGNOSIS — G4762 Sleep related leg cramps: Secondary | ICD-10-CM

## 2022-11-05 ENCOUNTER — Ambulatory Visit: Payer: BC Managed Care – PPO | Admitting: Family Medicine

## 2022-11-06 DIAGNOSIS — M79604 Pain in right leg: Secondary | ICD-10-CM | POA: Diagnosis not present

## 2022-11-06 DIAGNOSIS — R200 Anesthesia of skin: Secondary | ICD-10-CM | POA: Diagnosis not present

## 2022-11-06 DIAGNOSIS — E559 Vitamin D deficiency, unspecified: Secondary | ICD-10-CM | POA: Diagnosis not present

## 2022-11-06 DIAGNOSIS — Z7689 Persons encountering health services in other specified circumstances: Secondary | ICD-10-CM | POA: Diagnosis not present

## 2022-11-06 DIAGNOSIS — M62838 Other muscle spasm: Secondary | ICD-10-CM | POA: Diagnosis not present

## 2022-11-07 NOTE — Progress Notes (Unsigned)
Name: Lindsay Mcdonald   MRN: [id number]    DOB: 1961/09/10   Date:11/10/2022       Progress Note  Subjective  Chief Complaint  Follow Up  HPI  FMS: she has a long history of feeling aching and tender worse on trunk and upper extremities. Pain on shoulders, arms, stiff on her hips .Stiffiness is worse when she first gets up in the mornings, she has some mental fogginess, she has tried Lyrica and Duloxetine but stopped on her own, she has intense pain intermittently and worsening of mental fogginess that affects her work.  I gave her Lyrica again on her last visit but she has not started it yet . She has been taking skelaxin but is also taking baclofen for muscle cramps at night, explained not safe to have two different muscle relaxers. She asked me to fill out FMLA since she has to leave early at times due to increase in pain , she needs a few hours per month to be off   Mild Major Depression/insomnia: she is afraid of taking medications, explained again that SSRI is safer than BZD.   She has been on alprazolam XR and is afraid to stop taking medication. Phq 9 is still above goal   Denies suicidal thoughts or ideation. She is now living with her daughter due to financial difficulties and sleeping in a sofa .She saw a counselor for a few weeks but did not go back because it was in [place] . Unchanged   OA: seeing Dr. Candelaria Stagers and had steroid injections on both knees ,no effusion or redness,pain resolved ,she has a history of bakers cysts, knees are stiff intermittently but doing better, she recently had steroid injections on both shoulders again, also uses topical medications on her knees  DMII:glucose lately has been elevated due to prednisone  A1C went from 6.6 % to 7.9% to 8.1% ,6.7 %, 7 % 6.6 % , 7.1 % ,8.1 % to 7.1 %, 7.4 % 6.7 % and today is down to 6.5 %  She has been compliant with Xultophy ( sometimes skips by accident)  and also taking Metformin 1500 mg daily No polyphagia, polydipsia or  polyuria. She states occasional has tingling on her toes .She also has associated HTN, dyslipidemia, neuropathy and obesity.    OSA: she has not been back for titration yet, she states not having problems lately, used to gasp for air but states symptoms not as bad lately   HTN: she is now on Benicar 40 mg, no chest pain or palpation . BP is at goal, no side effects of medication. Continue medications    Hyperlipidemia: she has tried multiple statins and zetia but unable to tolerate due to severe muscle aches. She states fish oil is too expensive. Unchanged    Morbid Obesity: BMI is still above goal , she initially asked not to have her weight measured but explained it is important to have it monitored   DDD cervical spine: she went to  [facility] 12/23/2019 for evaluation, had c- spine x-ray that showed DDD C5-6, also had a normal shoulder x-ray, but MRI shoulder showed rotator cuff tear  Symptoms still present, she could not afford PT, taking muscle relaxer prn and getting steroid injections   Nocturnal leg cramps:  she states happening more often lately, feels like crawling on legs and sometimes cramps,she was seen by Dr. Melrose Nakayama, B12 was low, we will give her injections today, also low vitamin D and we will send supplements to  pharmacy   Patient Active Problem List   Diagnosis Date Noted   Hypertension associated with diabetes ([place]) 08/31/2020   History of marijuana use 03/30/2019   Fibromyalgia 12/31/2018   Venous vascular malformations 03/27/2016   Seasonal allergic rhinitis 09/26/2015   Muscle spasms of neck 06/19/2015   Insomnia 06/19/2015   Primary osteoarthritis of right knee 06/17/2015   Depression, major, recurrent, mild ([place]) 06/17/2015   Hyperlipidemia 06/17/2015   Benign hypertension 06/17/2015   Morbid obesity with BMI of 40.0-44.9, adult ([place]) 06/17/2015   Vitamin D deficiency 06/17/2015    Past Surgical History:  Procedure Laterality Date   BREAST REDUCTION SURGERY      ETHMOIDECTOMY Right 05/16/2020   Procedure: ETHMOIDECTOMY;  Surgeon: Margaretha Sheffield, MD;  Location: [facility] ORS;  Service: ENT;  Laterality: Right;   EXCISION ORAL TUMOR N/A 05/16/2020   Procedure: EXCISION OF SOFT PALATE PAPILLOMA;  Surgeon: Margaretha Sheffield, MD;  Location: [facility] ORS;  Service: ENT;  Laterality: N/A;   FRONTAL SINUS EXPLORATION Right 05/16/2020   Procedure: FRONTAL SINUS EXPLORATION;  Surgeon: Margaretha Sheffield, MD;  Location: [facility] OR[facility];  Service: ENT;  Laterality: Right;   IMAGE GUIDED SINUS SURGERY N/A 05/16/2020   Procedure: IMAGE GUIDED SINUS SURGERY;  Surgeon: Margaretha Sheffield, MD;  Location: [facility] OR[facility];  Service: ENT;  Laterality: N/A;   MAXILLARY ANTROSTOMY Bilateral 05/16/2020   Procedure: MAXILLARY ANTROSTOMY with tissue removal;  Surgeon: Margaretha Sheffield, MD;  Location: [facility] OR[facility];  Service: ENT;  Laterality: Bilateral;   nasal endoscopic     OOPHORECTOMY  ?   one ovary removed    REDUCTION MAMMAPLASTY Bilateral 2000   removal of ovary     SPHENOIDECTOMY Right 05/16/2020   Procedure: SPHENOIDECTOMY;  Surgeon: Margaretha Sheffield, MD;  Location: [facility] ORS;  Service: ENT;  Laterality: Right;    Family History  Problem Relation Age of Onset   Diabetes Mother    Cancer Mother 67       Breast   Breast cancer Mother 55   Gout Mother    Diabetes Father    Diabetes Daughter        Oldest Daughter   Breast cancer Maternal Aunt 55   Kidney cancer Maternal Aunt    Kidney disease Maternal Aunt    Stroke Maternal Uncle    Cancer Half-Sister 58       breast cancer   Prostate cancer Neg Hx    Bladder Cancer Neg Hx     Social History   Tobacco Use   Smoking status: Never   Smokeless tobacco: Never  Substance Use Topics   Alcohol use: No    Alcohol/week: 0.0 standard drinks of alcohol     Current Outpatient Medications:    albuterol (VENTOLIN HFA) 108 (90 Base) MCG/ACT inhaler, Inhale 2 puffs into the lungs every 6 (six) hours as needed for wheezing or shortness of breath., Disp: 8 g, Rfl:  2   aspirin EC 81 MG tablet, Take 81 mg by mouth daily. Swallow whole., Disp: , Rfl:    baclofen (LIORESAL) 10 MG tablet, Take 10 mg by mouth at bedtime., Disp: , Rfl:    fluticasone (FLONASE) 50 MCG/ACT nasal spray, Place 2 sprays into both nostrils daily as needed for allergies or rhinitis., Disp: , Rfl:    Melatonin 10 MG TABS, Take 10 mg by mouth at bedtime as needed (sleep). , Disp: , Rfl:    Vitamin D, Ergocalciferol, (DRISDOL) 1.25 MG (50000 UNIT) CAPS capsule, Take 1 capsule (50,000 Units total)  by mouth every 7 (seven) days., Disp: 12 capsule, Rfl: 1   ALPRAZolam (XANAX XR) 0.5 MG 24 hr tablet, Take 1 tablet (0.5 mg total) by mouth daily., Disp: 30 tablet, Rfl: 2   Insulin Degludec-Liraglutide (XULTOPHY) 100-3.6 UNIT-MG/ML SOPN, Inject 50 Units into the skin daily., Disp: 45 mL, Rfl: 1   metFORMIN (GLUCOPHAGE-XR) 750 MG 24 hr tablet, Take 2 tablets (1,500 mg total) by mouth daily with breakfast., Disp: 180 tablet, Rfl: 1   olmesartan (BENICAR) 40 MG tablet, Take 1 tablet (40 mg total) by mouth daily. In place of lisinopril for bp, Disp: 90 tablet, Rfl: 1   pregabalin (LYRICA) 50 MG capsule, Take 1-3 capsules (50-150 mg total) by mouth at bedtime. (Patient not taking: Reported on 11/10/2022), Disp: 90 capsule, Rfl: 0  Current Facility-Administered Medications:    cyanocobalamin (VITAMIN B12) injection 1,000 mcg, 1,000 mcg, Intramuscular, Once, Kwasi Joung, Drue Stager, MD  Allergies  Allergen Reactions   Simvastatin     Muscle cramps    Zetia [Ezetimibe] Other (See Comments)    Myalgia    Atorvastatin Other (See Comments)    muscle cramps   Compazine Other (See Comments)    Twitching and spasms of neck muscles   Penicillins Rash    Reaction under 25, patient unsure that she still has an allergy    I personally reviewed active problem list, medication list, allergies, family history, social history, health maintenance with the patient/caregiver today.   ROS  Constitutional: Negative  for fever or weight change.  Respiratory: Negative for cough and shortness of breath.   Cardiovascular: Negative for chest pain or palpitations.  Gastrointestinal: Negative for abdominal pain, no bowel changes.  Musculoskeletal: Negative for gait problem or joint swelling.  Skin: Negative for rash.  Neurological: Negative for dizziness or headache.  No other specific complaints in a complete review of systems (except as listed in HPI above).   Objective  Vitals:   11/10/22 1358  BP: 126/82  Pulse: 98  Resp: 16  SpO2: 99%  Weight: 274 lb (124.3 kg)  Height: _0  (1.626 m)    Body mass index is 47.03 kg/m.  Physical Exam  Constitutional: Patient appears well-developed and well-nourished. Obese  No distress.  HEENT: head atraumatic, normocephalic, pupils equal and reactive to light, neck suppl Cardiovascular: Normal rate, regular rhythm and normal heart sounds.  No murmur heard. No BLE edema. Pulmonary/Chest: Effort normal and breath sounds normal. No respiratory distress. Abdominal: Soft.  There is no tenderness. Psychiatric: Patient has a normal mood and affect. behavior is normal. Judgment and thought content normal.   Recent Results (from the past 2160 hour(s))  POCT HgB A1C     Status: Abnormal   Collection Time: 08/13/22  2:34 PM  Result Value Ref Range   Hemoglobin A1C 6.7 (A) 4.0 - 5.6 %   HbA1c POC (<> result, manual entry)     HbA1c, POC (prediabetic range)     HbA1c, POC (controlled diabetic range)    Ferritin     Status: None   Collection Time: 08/13/22  3:10 PM  Result Value Ref Range   Ferritin 48 16 - 232 ng/mL  Magnesium     Status: None   Collection Time: 08/13/22  3:10 PM  Result Value Ref Range   Magnesium 1.9 1.5 - 2.5 mg/dL  COMPLETE METABOLIC PANEL WITH GFR     Status: Abnormal   Collection Time: 08/13/22  3:10 PM  Result Value Ref Range   Glucose, Bld 149 (  H) 65 - 99 mg/dL    Comment: .            Fasting reference interval . For someone  without known diabetes, a glucose value >125 mg/dL indicates that they may have diabetes and this should be confirmed with a follow-up test. .    BUN 18 7 - 25 mg/dL   Creat 0.90 0.50 - 1.03 mg/dL   eGFR 74 > OR = 60 mL/min/1.72m   BUN/Creatinine Ratio SEE NOTE: 6 - 22 (calc)    Comment:    Not Reported: BUN and Creatinine are within    reference range. .    Sodium 144 135 - 146 mmol/L   Potassium 4.2 3.5 - 5.3 mmol/L   Chloride 110 98 - 110 mmol/L   CO2 26 20 - 32 mmol/L   Calcium 9.1 8.6 - 10.4 mg/dL   Total Protein 6.4 6.1 - 8.1 g/dL   Albumin 3.7 3.6 - 5.1 g/dL   Globulin 2.7 1.9 - 3.7 g/dL (calc)   AG Ratio 1.4 1.0 - 2.5 (calc)   Total Bilirubin 0.3 0.2 - 1.2 mg/dL   Alkaline phosphatase (APISO) 78 37 - 153 U/L   AST 21 10 - 35 U/L   ALT 21 6 - 29 U/L  CBC with Differential/Platelet     Status: Abnormal   Collection Time: 08/13/22  3:10 PM  Result Value Ref Range   WBC 8.7 3.8 - 10.8 Thousand/uL   RBC 4.78 3.80 - 5.10 Million/uL   Hemoglobin 12.5 11.7 - 15.5 g/dL   HCT 37.9 35.0 - 45.0 %   MCV 79.3 (L) 80.0 - 100.0 fL   MCH 26.2 (L) 27.0 - 33.0 pg   MCHC 33.0 32.0 - 36.0 g/dL   RDW 13.2 11.0 - 15.0 %   Platelets 276 140 - 400 Thousand/uL   MPV 11.7 7.5 - 12.5 fL   Neutro Abs 5,612 1,500 - 7,800 cells/uL   Lymphs Abs 2,149 850 - 3,900 cells/uL   Absolute Monocytes 539 200 - 950 cells/uL   Eosinophils Absolute 322 15 - 500 cells/uL   Basophils Absolute 78 0 - 200 cells/uL   Neutrophils Relative % 64.5 %   Total Lymphocyte 24.7 %   Monocytes Relative 6.2 %   Eosinophils Relative 3.7 %   Basophils Relative 0.9 %  POCT HgB A1C     Status: Abnormal   Collection Time: 11/10/22  2:00 PM  Result Value Ref Range   Hemoglobin A1C 6.5 (A) 4.0 - 5.6 %   HbA1c POC (<> result, manual entry)     HbA1c, POC (prediabetic range)     HbA1c, POC (controlled diabetic range)      Diabetic Foot Exam: Diabetic Foot Exam - Simple   Simple Foot Form Visual Inspection No  deformities, no ulcerations, no other skin breakdown bilaterally: Yes Sensation Testing Intact to touch and monofilament testing bilaterally: Yes Pulse Check Posterior Tibialis and Dorsalis pulse intact bilaterally: Yes Comments     PHQ2/9:    11/10/2022    1:59 PM 08/13/2022    2:33 PM 05/14/2022    8:20 AM 04/16/2022    9:20 AM 02/19/2022    9:50 AM  Depression screen PHQ 2/9  Decreased Interest _0 0 1  Down, Depressed, Hopeless _1 0 1  PHQ - 2 Score _2 0 2  Altered sleeping 0 3 0 0 1  Tired, decreased energy 0 0 0 0 1  Change in  appetite  3 0 0 1  Feeling bad or failure about yourself  _0 0 1  Trouble concentrating 0 0 0 0 0  Moving slowly or fidgety/restless 0 0 0 0 0  Suicidal thoughts 0 0 0 0 0  PHQ-9 Score _1 0 6  Difficult doing work/chores    Not difficult at all Somewhat difficult    phq 9 is positive   Fall Risk:    11/10/2022    1:59 PM 08/15/2022    9:47 AM 08/13/2022    2:33 PM 05/14/2022    8:20 AM 04/16/2022    9:18 AM  Fall Risk   Falls in the past year? 0 0 0 0 0  Number falls in past yr: 0 0 0 0 0  Injury with Fall? 0 0 0 0 0  Risk for fall due to : No Fall Risks No Fall Risks No Fall Risks No Fall Risks   Follow up Falls prevention discussed Falls prevention discussed Falls prevention discussed Falls prevention discussed Falls evaluation completed      Functional Status Survey: Is the patient deaf or have difficulty hearing?: No Does the patient have difficulty seeing, even when wearing glasses/contacts?: Yes Does the patient have difficulty concentrating, remembering, or making decisions?: Yes Does the patient have difficulty walking or climbing stairs?: No Does the patient have difficulty dressing or bathing?: No Does the patient have difficulty doing errands alone such as visiting a doctor's office or shopping?: No    Assessment & Plan  1. Dyslipidemia associated with type 2 diabetes mellitus ([place])  - POCT HgB A1C - HM  Diabetes Foot Exam  2. Hypertension associated with diabetes ([place])  - olmesartan (BENICAR) 40 MG tablet; Take 1 tablet (40 mg total) by mouth daily. In place of lisinopril for bp  Dispense: 90 tablet; Refill: 1  3. Diabetic peripheral neuropathy associated with type 2 diabetes mellitus ([facility])  - Insulin Degludec-Liraglutide (XULTOPHY) 100-3.6 UNIT-MG/ML SOPN; Inject 50 Units into the skin daily.  Dispense: 45 mL; Refill: 1 - metFORMIN (GLUCOPHAGE-XR) 750 MG 24 hr tablet; Take 2 tablets (1,500 mg total) by mouth daily with breakfast.  Dispense: 180 tablet; Refill: 1  4. Morbid obesity with BMI of 40.0-44.9, adult [facility])  Discussed with the patient the risk posed by an increased BMI. Discussed importance of portion control, calorie counting and at least 150 minutes of physical activity weekly. Avoid sweet beverages and drink more water. Eat at least 6 servings of fruit and vegetables daily    5. Depression, major, recurrent, mild ([place])  She refuses new medications   6. Need for immunization against influenza  - Flu Vaccine QUAD 6+ mos PF IM (Fluarix Quad PF)  7. GAD (generalized anxiety disorder)  - ALPRAZolam (XANAX XR) 0.5 MG 24 hr tablet; Take 1 tablet (0.5 mg total) by mouth daily.  Dispense: 30 tablet; Refill: 2  8. B12 deficiency  - cyanocobalamin (VITAMIN B12) injection 1,000 mcg  9. Vitamin D deficiency  - Vitamin D, Ergocalciferol, (DRISDOL) 1.25 MG (50000 UNIT) CAPS capsule; Take 1 capsule (50,000 Units total) by mouth every 7 (seven) days.  Dispense: 12 capsule; Refill: 1  10. Colon cancer screening  - Fecal Globin By Immunochemistry  11. Statin myopathy   12. Primary insomnia   13. Nocturnal leg cramps   14. Fibromyalgia   15. Chronic right shoulder pain

## 2022-11-10 ENCOUNTER — Other Ambulatory Visit: Payer: Self-pay | Admitting: Family Medicine

## 2022-11-10 ENCOUNTER — Encounter: Payer: Self-pay | Admitting: Family Medicine

## 2022-11-10 ENCOUNTER — Ambulatory Visit: Payer: BC Managed Care – PPO | Admitting: Family Medicine

## 2022-11-10 VITALS — BP 126/82 | HR 98 | Resp 16 | Ht 64.0 in | Wt 274.0 lb

## 2022-11-10 DIAGNOSIS — E1159 Type 2 diabetes mellitus with other circulatory complications: Secondary | ICD-10-CM

## 2022-11-10 DIAGNOSIS — E538 Deficiency of other specified B group vitamins: Secondary | ICD-10-CM | POA: Diagnosis not present

## 2022-11-10 DIAGNOSIS — Z1211 Encounter for screening for malignant neoplasm of colon: Secondary | ICD-10-CM

## 2022-11-10 DIAGNOSIS — E1169 Type 2 diabetes mellitus with other specified complication: Secondary | ICD-10-CM | POA: Diagnosis not present

## 2022-11-10 DIAGNOSIS — Z23 Encounter for immunization: Secondary | ICD-10-CM | POA: Diagnosis not present

## 2022-11-10 DIAGNOSIS — G720 Drug-induced myopathy: Secondary | ICD-10-CM

## 2022-11-10 DIAGNOSIS — M25511 Pain in right shoulder: Secondary | ICD-10-CM

## 2022-11-10 DIAGNOSIS — M797 Fibromyalgia: Secondary | ICD-10-CM

## 2022-11-10 DIAGNOSIS — I152 Hypertension secondary to endocrine disorders: Secondary | ICD-10-CM

## 2022-11-10 DIAGNOSIS — G8929 Other chronic pain: Secondary | ICD-10-CM

## 2022-11-10 DIAGNOSIS — E785 Hyperlipidemia, unspecified: Secondary | ICD-10-CM

## 2022-11-10 DIAGNOSIS — Z6841 Body Mass Index (BMI) 40.0 and over, adult: Secondary | ICD-10-CM

## 2022-11-10 DIAGNOSIS — F411 Generalized anxiety disorder: Secondary | ICD-10-CM

## 2022-11-10 DIAGNOSIS — E559 Vitamin D deficiency, unspecified: Secondary | ICD-10-CM

## 2022-11-10 DIAGNOSIS — E1142 Type 2 diabetes mellitus with diabetic polyneuropathy: Secondary | ICD-10-CM | POA: Diagnosis not present

## 2022-11-10 DIAGNOSIS — F5101 Primary insomnia: Secondary | ICD-10-CM

## 2022-11-10 DIAGNOSIS — G4762 Sleep related leg cramps: Secondary | ICD-10-CM

## 2022-11-10 DIAGNOSIS — T466X5A Adverse effect of antihyperlipidemic and antiarteriosclerotic drugs, initial encounter: Secondary | ICD-10-CM

## 2022-11-10 DIAGNOSIS — F1320 Sedative, hypnotic or anxiolytic dependence, uncomplicated: Secondary | ICD-10-CM

## 2022-11-10 DIAGNOSIS — F330 Major depressive disorder, recurrent, mild: Secondary | ICD-10-CM

## 2022-11-10 LAB — POCT GLYCOSYLATED HEMOGLOBIN (HGB A1C): Hemoglobin A1C: 6.5 % — AB (ref 4.0–5.6)

## 2022-11-10 MED ORDER — OLMESARTAN MEDOXOMIL 40 MG PO TABS: 40.0000 mg | ORAL_TABLET | Freq: Every day | ORAL | 1 refills | Status: DC

## 2022-11-10 MED ORDER — CYANOCOBALAMIN 1000 MCG/ML IJ SOLN
1000.0000 ug | Freq: Once | INTRAMUSCULAR | Status: AC
  Administered 2022-11-10: 1000 ug via INTRAMUSCULAR

## 2022-11-10 MED ORDER — METFORMIN HCL ER 750 MG PO TB24: 1500.0000 mg | ORAL_TABLET | Freq: Every day | ORAL | 1 refills | Status: DC

## 2022-11-10 MED ORDER — VITAMIN D (ERGOCALCIFEROL) 1.25 MG (50000 UNIT) PO CAPS: 50000.0000 [IU] | ORAL_CAPSULE | ORAL | 1 refills | Status: DC

## 2022-11-10 MED ORDER — CLONIDINE HCL 0.1 MG PO TABS: 0.1000 mg | ORAL_TABLET | Freq: Three times a day (TID) | ORAL | 0 refills | Status: DC

## 2022-11-10 MED ORDER — ALPRAZOLAM ER 0.5 MG PO TB24: 0.5000 mg | ORAL_TABLET | Freq: Every day | ORAL | 2 refills | Status: DC

## 2022-11-10 MED ORDER — XULTOPHY 100-3.6 UNIT-MG/ML ~~LOC~~ SOPN: 50.0000 [IU] | PEN_INJECTOR | Freq: Every day | SUBCUTANEOUS | 1 refills | Status: DC

## 2022-11-11 NOTE — Telephone Encounter (Signed)
Pt asked if she could come by today or preferably tomorrow morning to discuss FMLA paperwork. She stated that when she spoke with the company that [employer], there appeared to be a discrepancy on the paperwork, and she would like to see if Dr.Sowles could revise.  Please advise.

## 2022-11-13 ENCOUNTER — Encounter: Payer: Self-pay | Admitting: Family Medicine

## 2022-11-18 ENCOUNTER — Encounter: Payer: Self-pay | Admitting: Family Medicine

## 2022-11-19 ENCOUNTER — Encounter: Payer: Self-pay | Admitting: Family Medicine

## 2022-11-19 ENCOUNTER — Ambulatory Visit: Payer: BC Managed Care – PPO | Admitting: Family Medicine

## 2022-11-20 ENCOUNTER — Encounter: Payer: Self-pay | Admitting: Family Medicine

## 2022-11-23 ENCOUNTER — Encounter: Payer: Self-pay | Admitting: Family Medicine

## 2022-11-24 ENCOUNTER — Ambulatory Visit (INDEPENDENT_AMBULATORY_CARE_PROVIDER_SITE_OTHER): Payer: BC Managed Care – PPO

## 2022-11-24 DIAGNOSIS — E538 Deficiency of other specified B group vitamins: Secondary | ICD-10-CM

## 2022-11-24 MED ORDER — CYANOCOBALAMIN 1000 MCG/ML IJ SOLN
1000.0000 ug | Freq: Once | INTRAMUSCULAR | Status: AC
  Administered 2022-11-24: 1000 ug via INTRAMUSCULAR

## 2022-11-25 ENCOUNTER — Encounter: Payer: Self-pay | Admitting: Family Medicine

## 2022-11-27 ENCOUNTER — Ambulatory Visit: Payer: Self-pay | Admitting: *Deleted

## 2022-11-27 NOTE — Telephone Encounter (Signed)
Summary: Medication questions   Pt has questions regarding her medication cloNIDine (CATAPRES) 0.1 MG tablet, says she is only taking this med once a day because she does not think she has enough for 3 x a day. Also says the medication makes her jittery, wants to know if she should continue taking or not?        Called pt - LMOMTCB

## 2022-11-27 NOTE — Telephone Encounter (Signed)
  Chief Complaint: requesting advise if she should continue taking medication catapres 0.1 mg ?  Symptoms: has been taking x 2 weeks and has been afraid she will run out of medication so started taking every other day. Ordered to take 1 tablet 3 times daily. C/o feeling "jittery " today . Patient unable to answer more questions due to going back to work.  Frequency: today  Pertinent Negatives: Patient denies chest pain no difficulty breathing, did report needed to take deep breath every now and then. No palpitations  Disposition: '[]'$ ED /'[]'$ Urgent Care (no appt availability in office) / '[]'$ Appointment(In office/virtual)/ '[]'$  [place] [facility]/ '[]'$ Home Care/ '[]'$ Refused Recommended Disposition /'[]'$ [place] [facility]/ '[x]'$  Follow-up with PCP Additional Notes:  Patient reports no refills on bottle when NT asked why didn't she call pharmacy for refills . Please advise for refills and contact patient with question regarding taking 3 times daily. Please advise.      Reason for Disposition  [1] Caller has URGENT medicine question about med that PCP or specialist prescribed AND [2] triager unable to answer question  Answer Assessment - Initial Assessment Questions 1. NAME of MEDICINE: "What medicine(s) are you calling about?"     Catapres 0.1 mg 2. QUESTION: "What is your question?" (e.g., double dose of medicine, side effect)     Should patient continue taking ? 3. PRESCRIBER: "Who prescribed the medicine?" Reason: if prescribed by specialist, call should be referred to that group.     PCP 4. SYMPTOMS: "Do you have any symptoms?" If Yes, ask: "What symptoms are you having?"  "How bad are the symptoms (e.g., mild, moderate, severe)     Feels "jittery" this am. Patient has been taking every other day because she felt she would run out of medication due to no refills on bottle. 5. PREGNANCY:  "Is there any chance that you are pregnant?" "When was your last menstrual period?"     na  Protocols used:  Medication Question Call-A-AH

## 2022-11-27 NOTE — Telephone Encounter (Signed)
Attempted to return her call.   Left voicemail to call back.

## 2022-11-27 NOTE — Telephone Encounter (Signed)
Message from Erick Blinks sent at 11/27/2022  1:28 PM EST  Summary: Medication questions   Pt has questions regarding her medication cloNIDine (CATAPRES) 0.1 MG tablet, says she is only taking this med once a day because she does not think she has enough for 3 x a day. Also says the medication makes her jittery, wants to know if she should continue taking or not?          Call History   Type Contact Phone/Fax User  11/27/2022 01:27 PM EST Phone (Incoming) Teruko, [place] (Self) [phone] Lemmie Evens) Marlan Palau

## 2022-11-28 ENCOUNTER — Encounter: Payer: Self-pay | Admitting: Family Medicine

## 2022-11-28 ENCOUNTER — Other Ambulatory Visit: Payer: Self-pay | Admitting: Family Medicine

## 2022-11-28 ENCOUNTER — Ambulatory Visit: Payer: Self-pay | Admitting: *Deleted

## 2022-11-28 DIAGNOSIS — F1320 Sedative, hypnotic or anxiolytic dependence, uncomplicated: Secondary | ICD-10-CM

## 2022-11-28 MED ORDER — CLONIDINE HCL 0.1 MG PO TABS: 0.1000 mg | ORAL_TABLET | Freq: Three times a day (TID) | ORAL | 0 refills | Status: DC

## 2022-11-28 NOTE — Telephone Encounter (Signed)
During my office visit she mentioned Clonidine for me.   We are trying to wean me off the Alprazolam XR.    I've been on it for 7 years.  Nov. 28 I started a  log.   I would take a Alprazolam XR one day and then the Clonidine the next day.    Pt looked at Dr. Ancil Boozer' note in [place].   She said not to break the Alprazolam XR.    I'm not taking the Clonidine 3 times a day.   Only taking it once a day every other day.   She didn't give me very many.   I'm afraid I was going to run out.   I'm going to take my time with getting off this Alprazolam XR.   I'm concerned about that.   I've read there are bad side effects of coming off of this too fast.   I'm under a lot of stress.    I usually take it early in the morning.   I waited and took it at 9:00 AM because I was feeling jittery.   That stopped the jitters after I took it.    I think I need see her.   I need an appt.     I made her an appt for Dec. 13 at 7:40.   She thanked me for my help. Reason for Disposition  [1] Caller has URGENT medicine question about med that PCP or specialist prescribed AND [2] triager unable to answer question  Answer Assessment - Initial Assessment Questions 1. NAME of MEDICINE: "What medicine(s) are you calling about?"     Alprazolam XR 2. QUESTION: "What is your question?" (e.g., double dose of medicine, side effect)     She has concerns about going into withdrawal since she has been on this for 7 years.   She is only taking the Clonidine every other day instead of 3 times a day as directed because "Dr. Ancil Boozer didn't give me enough or very many of the Clonidine and I'm afraid I'm going to run out before I'm able to be off the Alprazolam XR completely without going into withdrawal".   3. PRESCRIBER: "Who prescribed the medicine?" Reason: if prescribed by specialist, call should be referred to that group.     Dr. Ancil Boozer 4. SYMPTOMS: "Do you have any symptoms?" If Yes, ask: "What symptoms are you having?"  "How bad are  the symptoms (e.g., mild, moderate, severe)     Jittery  5. PREGNANCY:  "Is there any chance that you are pregnant?" "When was your last menstrual period?"     N/A due to age  Protocols used: Medication Question [place]  Chief Complaint: I read the note from Dr. Ancil Boozer to pt regarding taking the Alprazolam XR and the Clinidine.  She is concerned that she is being taken off the Alprazolam XR too fast.   See triage notes. Symptoms: jittery Frequency: When it's too long between taking the Alprazolam XR Pertinent Negatives: Patient denies taking the Clonidine 3 times a day.   Only taking it every other day Disposition: '[]'$ ED /'[]'$ Urgent Care (no appt availability in office) / '[]'$ Appointment(In office/virtual)/ '[]'$  [place] [facility]/ '[]'$ Home Care/ '[]'$ Refused Recommended Disposition /'[]'$  Mobile Bus/ '[x]'$  Follow-up with PCP Additional Notes: I made her an appt. To come in and discuss this with Dr. Ancil Boozer as she has many questions and concerns.   Appt. Is next Wed. 7:40.    I have sent this information to Dr. Ancil Boozer.

## 2022-11-28 NOTE — Telephone Encounter (Signed)
Called patient to clarify prescriptions. No answer, lvm for callback.

## 2022-12-02 NOTE — Progress Notes (Signed)
Name: Lindsay Mcdonald   MRN: [id number]    DOB: [birth year]   Date:12/03/2022       Progress Note  Subjective  Chief Complaint  Consult  HPI  Anxiety: she came in to discuss how she is doing weaning self off alprazolam XR. She has been on 0.5 mg for 7 years, she has been able to taper down to taking one tablets every other day for the past two weeks, she has been taking clonidine in am's. Denies diaphoresis or tachycardia. She had one episode of crying while at work but she was able to calm herself down by walking away for a little while.   Explained that she is doing well. We will add Atenolol to take daily, continue clonidine up to twice daily and we will switch to alprazolam 0.25 mg to take prn panic attacks   Patient Active Problem List   Diagnosis Date Noted   Hypertension associated with diabetes (HCC) 08/31/2020   History of marijuana use 03/30/2019   Fibromyalgia 12/31/2018   Venous vascular malformations 03/27/2016   Seasonal allergic rhinitis 09/26/2015   Muscle spasms of neck 06/19/2015   Insomnia 06/19/2015   Primary osteoarthritis of right knee 06/17/2015   Depression, major, recurrent, mild (HCC) 06/17/2015   Hyperlipidemia 06/17/2015   Benign hypertension 06/17/2015   Morbid obesity with BMI of 40.0-44.9, adult (HCC) 06/17/2015   Vitamin D deficiency 06/17/2015    Past Surgical History:  Procedure Laterality Date   BREAST REDUCTION SURGERY     ETHMOIDECTOMY Right 05/16/2020   Procedure: ETHMOIDECTOMY;  Surgeon: Vernie Murders, MD;  Location: [facility];  Service: ENT;  Laterality: Right;   EXCISION ORAL TUMOR N/A 05/16/2020   Procedure: EXCISION OF SOFT PALATE PAPILLOMA;  Surgeon: Vernie Murders, MD;  Location: [facility];  Service: ENT;  Laterality: N/A;   FRONTAL SINUS EXPLORATION Right 05/16/2020   Procedure: FRONTAL SINUS EXPLORATION;  Surgeon: Vernie Murders, MD;  Location: [facility];  Service: ENT;  Laterality: Right;   IMAGE GUIDED SINUS SURGERY N/A 05/16/2020    Procedure: IMAGE GUIDED SINUS SURGERY;  Surgeon: Vernie Murders, MD;  Location: [facility];  Service: ENT;  Laterality: N/A;   MAXILLARY ANTROSTOMY Bilateral 05/16/2020   Procedure: MAXILLARY ANTROSTOMY with tissue removal;  Surgeon: Vernie Murders, MD;  Location: [facility];  Service: ENT;  Laterality: Bilateral;   nasal endoscopic     OOPHORECTOMY  ?   one ovary removed    REDUCTION MAMMAPLASTY Bilateral 2000   removal of ovary     SPHENOIDECTOMY Right 05/16/2020   Procedure: SPHENOIDECTOMY;  Surgeon: Vernie Murders, MD;  Location: [facility];  Service: ENT;  Laterality: Right;    Family History  Problem Relation Age of Onset   Diabetes Mother    Cancer Mother 74       Breast   Breast cancer Mother 65   Gout Mother    Diabetes Father    Diabetes Daughter        Oldest Daughter   Breast cancer Maternal Aunt 55   Kidney cancer Maternal Aunt    Kidney disease Maternal Aunt    Stroke Maternal Uncle    Cancer Half-Sister 67       breast cancer   Prostate cancer Neg Hx    Bladder Cancer Neg Hx     Social History   Tobacco Use   Smoking status: Never   Smokeless tobacco: Never  Substance Use Topics   Alcohol use: No    Alcohol/week: 0.0 standard drinks of  alcohol     Current Outpatient Medications:    albuterol (VENTOLIN HFA) 108 (90 Base) MCG/ACT inhaler, Inhale 2 puffs into the lungs every 6 (six) hours as needed for wheezing or shortness of breath., Disp: 8 g, Rfl: 2   aspirin EC 81 MG tablet, Take 81 mg by mouth daily. Swallow whole., Disp: , Rfl:    baclofen (LIORESAL) 10 MG tablet, Take 10 mg by mouth at bedtime., Disp: , Rfl:    cloNIDine (CATAPRES) 0.1 MG tablet, Take 1 tablet (0.1 mg total) by mouth 3 (three) times daily. You can try decreasing to twice daily next week and after that just at night, Disp: 90 tablet, Rfl: 0   fluticasone (FLONASE) 50 MCG/ACT nasal spray, Place 2 sprays into both nostrils daily as needed for allergies or rhinitis., Disp: , Rfl:    Insulin  Degludec-Liraglutide (XULTOPHY) 100-3.6 UNIT-MG/ML SOPN, Inject 50 Units into the skin daily., Disp: 45 mL, Rfl: 1   Melatonin 10 MG TABS, Take 10 mg by mouth at bedtime as needed (sleep). , Disp: , Rfl:    metFORMIN (GLUCOPHAGE-XR) 750 MG 24 hr tablet, Take 2 tablets (1,500 mg total) by mouth daily with breakfast., Disp: 180 tablet, Rfl: 1   olmesartan (BENICAR) 40 MG tablet, Take 1 tablet (40 mg total) by mouth daily. In place of lisinopril for bp, Disp: 90 tablet, Rfl: 1   pregabalin (LYRICA) 50 MG capsule, Take 1-3 capsules (50-150 mg total) by mouth at bedtime., Disp: 90 capsule, Rfl: 0   Vitamin D, Ergocalciferol, (DRISDOL) 1.25 MG (50000 UNIT) CAPS capsule, Take 1 capsule (50,000 Units total) by mouth every 7 (seven) days., Disp: 12 capsule, Rfl: 1  Allergies  Allergen Reactions   Simvastatin     Muscle cramps    Zetia [Ezetimibe] Other (See Comments)    Myalgia    Atorvastatin Other (See Comments)    muscle cramps   Compazine Other (See Comments)    Twitching and spasms of neck muscles   Penicillins Rash    Reaction under 25, patient unsure that she still has an allergy    I personally reviewed active problem list, medication list, allergies, family history, social history, health maintenance with the patient/caregiver today.   ROS  Ten systems reviewed and is negative except as mentioned in HPI   Objective  Vitals:   12/03/22 0737  BP: 130/82  Pulse: 97  Resp: 16  SpO2: 98%  Weight: 277 lb (125.6 kg)  Height: 5\' 4"  (1.626 m)    Body mass index is 47.55 kg/m.  Physical Exam  Constitutional: Patient appears well-developed and well-nourished. Obese  No distress.  HEENT: head atraumatic, normocephalic, pupils equal and reactive to light, neck supple Cardiovascular: Normal rate, regular rhythm and normal heart sounds.  No murmur heard. No BLE edema. Pulmonary/Chest: Effort normal and breath sounds normal. No respiratory distress. Abdominal: Soft.  There is no  tenderness. Psychiatric: Patient has a normal mood and affect. behavior is normal. Judgment and thought content normal.   Recent Results (from the past 2160 hour(s))  POCT HgB A1C     Status: Abnormal   Collection Time: 11/10/22  2:00 PM  Result Value Ref Range   Hemoglobin A1C 6.5 (A) 4.0 - 5.6 %   HbA1c POC (<> result, manual entry)     HbA1c, POC (prediabetic range)     HbA1c, POC (controlled diabetic range)      PHQ2/9:    12/03/2022    7:42 AM 11/10/2022  1:59 PM 08/13/2022    2:33 PM 05/14/2022    8:20 AM 04/16/2022    9:20 AM  Depression screen PHQ 2/9  Decreased Interest 2 2 1 1  0  Down, Depressed, Hopeless 1 1 1 1  0  PHQ - 2 Score 3 3 2 2  0  Altered sleeping 1 0 3 0 0  Tired, decreased energy 1 0 0 0 0  Change in appetite 0  3 0 0  Feeling bad or failure about yourself  0 1 3 1  0  Trouble concentrating 0 0 0 0 0  Moving slowly or fidgety/restless 0 0 0 0 0  Suicidal thoughts 0 0 0 0 0  PHQ-9 Score 5 4 11 3  0  Difficult doing work/chores     Not difficult at all    phq 9 is positive    Fall Risk:    12/03/2022    7:37 AM 11/10/2022    1:59 PM 08/15/2022    9:47 AM 08/13/2022    2:33 PM 05/14/2022    8:20 AM  Fall Risk   Falls in the past year? 0 0 0 0 0  Number falls in past yr: 0 0 0 0 0  Injury with Fall? 0 0 0 0 0  Risk for fall due to : No Fall Risks No Fall Risks No Fall Risks No Fall Risks No Fall Risks  Follow up Falls prevention discussed Falls prevention discussed Falls prevention discussed Falls prevention discussed Falls prevention discussed      Functional Status Survey: Is the patient deaf or have difficulty hearing?: No Does the patient have difficulty seeing, even when wearing glasses/contacts?: Yes Does the patient have difficulty concentrating, remembering, or making decisions?: Yes Does the patient have difficulty walking or climbing stairs?: No Does the patient have difficulty dressing or bathing?: No Does the patient have  difficulty doing errands alone such as visiting a doctor's office or shopping?: No    Assessment & Plan  1. GAD (generalized anxiety disorder)  - ALPRAZolam (XANAX) 0.25 MG tablet; Take 1 tablet (0.25 mg total) by mouth 2 (two) times daily as needed for anxiety.  Dispense: 30 tablet; Refill: 0 - atenolol (TENORMIN) 25 MG tablet; Take 1 tablet (25 mg total) by mouth daily.  Dispense: 90 tablet; Refill: 0  2. Benzodiazepine dependence (HCC)  - atenolol (TENORMIN) 25 MG tablet; Take 1 tablet (25 mg total) by mouth daily.  Dispense: 90 tablet; Refill: 0  There are no diagnoses linked to this encounter.

## 2022-12-03 ENCOUNTER — Encounter: Payer: Self-pay | Admitting: Family Medicine

## 2022-12-03 ENCOUNTER — Ambulatory Visit (INDEPENDENT_AMBULATORY_CARE_PROVIDER_SITE_OTHER): Payer: BC Managed Care – PPO | Admitting: Family Medicine

## 2022-12-03 VITALS — BP 130/82 | HR 97 | Resp 16 | Ht 64.0 in | Wt 277.0 lb

## 2022-12-03 DIAGNOSIS — F411 Generalized anxiety disorder: Secondary | ICD-10-CM | POA: Diagnosis not present

## 2022-12-03 DIAGNOSIS — F1320 Sedative, hypnotic or anxiolytic dependence, uncomplicated: Secondary | ICD-10-CM

## 2022-12-03 MED ORDER — ATENOLOL 25 MG PO TABS: 25.0000 mg | ORAL_TABLET | Freq: Every day | ORAL | 0 refills | Status: DC

## 2022-12-03 MED ORDER — ALPRAZOLAM 0.25 MG PO TABS: 0.2500 mg | ORAL_TABLET | Freq: Two times a day (BID) | ORAL | 0 refills | Status: DC | PRN

## 2022-12-08 ENCOUNTER — Ambulatory Visit (INDEPENDENT_AMBULATORY_CARE_PROVIDER_SITE_OTHER): Payer: BC Managed Care – PPO

## 2022-12-08 DIAGNOSIS — E538 Deficiency of other specified B group vitamins: Secondary | ICD-10-CM | POA: Diagnosis not present

## 2022-12-08 MED ORDER — CYANOCOBALAMIN 1000 MCG/ML IJ SOLN
1000.0000 ug | Freq: Once | INTRAMUSCULAR | Status: AC
  Administered 2022-12-08: 1000 ug via INTRAMUSCULAR

## 2022-12-10 DIAGNOSIS — R200 Anesthesia of skin: Secondary | ICD-10-CM | POA: Diagnosis not present

## 2022-12-17 ENCOUNTER — Encounter: Payer: Self-pay | Admitting: Family Medicine

## 2022-12-21 ENCOUNTER — Encounter: Payer: Self-pay | Admitting: Family Medicine

## 2022-12-24 ENCOUNTER — Ambulatory Visit (INDEPENDENT_AMBULATORY_CARE_PROVIDER_SITE_OTHER): Payer: BC Managed Care – PPO

## 2022-12-24 DIAGNOSIS — E538 Deficiency of other specified B group vitamins: Secondary | ICD-10-CM

## 2022-12-24 MED ORDER — CYANOCOBALAMIN 1000 MCG/ML IJ SOLN
1000.0000 ug | Freq: Once | INTRAMUSCULAR | Status: AC
  Administered 2022-12-24: 1000 ug via INTRAMUSCULAR

## 2022-12-24 NOTE — Progress Notes (Signed)
Patient tolerated injection well with no immediate adverse reaction noted.

## 2022-12-25 DIAGNOSIS — M7989 Other specified soft tissue disorders: Secondary | ICD-10-CM | POA: Diagnosis not present

## 2022-12-25 DIAGNOSIS — M25522 Pain in left elbow: Secondary | ICD-10-CM | POA: Diagnosis not present

## 2022-12-25 DIAGNOSIS — M7022 Olecranon bursitis, left elbow: Secondary | ICD-10-CM | POA: Diagnosis not present

## 2022-12-29 ENCOUNTER — Ambulatory Visit: Payer: BC Managed Care – PPO | Admitting: Family Medicine

## 2023-01-02 ENCOUNTER — Ambulatory Visit: Payer: BC Managed Care – PPO

## 2023-01-02 VITALS — BP 132/76

## 2023-01-02 DIAGNOSIS — E559 Vitamin D deficiency, unspecified: Secondary | ICD-10-CM | POA: Diagnosis not present

## 2023-01-02 DIAGNOSIS — M62838 Other muscle spasm: Secondary | ICD-10-CM | POA: Diagnosis not present

## 2023-01-02 DIAGNOSIS — Z0130 Encounter for examination of blood pressure without abnormal findings: Secondary | ICD-10-CM

## 2023-01-02 DIAGNOSIS — M79604 Pain in right leg: Secondary | ICD-10-CM | POA: Diagnosis not present

## 2023-01-02 DIAGNOSIS — R200 Anesthesia of skin: Secondary | ICD-10-CM | POA: Diagnosis not present

## 2023-01-07 ENCOUNTER — Other Ambulatory Visit: Payer: Self-pay | Admitting: Physician Assistant

## 2023-01-07 ENCOUNTER — Ambulatory Visit (INDEPENDENT_AMBULATORY_CARE_PROVIDER_SITE_OTHER): Payer: BC Managed Care – PPO

## 2023-01-07 ENCOUNTER — Encounter: Payer: Self-pay | Admitting: Family Medicine

## 2023-01-07 DIAGNOSIS — R200 Anesthesia of skin: Secondary | ICD-10-CM

## 2023-01-07 DIAGNOSIS — E538 Deficiency of other specified B group vitamins: Secondary | ICD-10-CM

## 2023-01-07 MED ORDER — CYANOCOBALAMIN 1000 MCG/ML IJ SOLN
1000.0000 ug | Freq: Once | INTRAMUSCULAR | Status: AC
  Administered 2023-01-07: 1000 ug via INTRAMUSCULAR

## 2023-01-12 DIAGNOSIS — Z1211 Encounter for screening for malignant neoplasm of colon: Secondary | ICD-10-CM | POA: Diagnosis not present

## 2023-01-13 LAB — FECAL GLOBIN BY IMMUNOCHEMISTRY
FECAL GLOBIN RESULT:: DETECTED — AB
MICRO NUMBER:: 14454647
SPECIMEN QUALITY:: ADEQUATE

## 2023-01-14 ENCOUNTER — Other Ambulatory Visit: Payer: Self-pay | Admitting: Family Medicine

## 2023-01-14 ENCOUNTER — Encounter: Payer: Self-pay | Admitting: Family Medicine

## 2023-01-14 DIAGNOSIS — R195 Other fecal abnormalities: Secondary | ICD-10-CM

## 2023-01-16 ENCOUNTER — Other Ambulatory Visit: Payer: Self-pay

## 2023-01-16 ENCOUNTER — Telehealth: Payer: Self-pay

## 2023-01-16 DIAGNOSIS — R195 Other fecal abnormalities: Secondary | ICD-10-CM

## 2023-01-16 DIAGNOSIS — Z1211 Encounter for screening for malignant neoplasm of colon: Secondary | ICD-10-CM

## 2023-01-16 MED ORDER — NA SULFATE-K SULFATE-MG SULF 17.5-3.13-1.6 GM/177ML PO SOLN: 1.0000 | Freq: Once | ORAL | 0 refills | Status: AC

## 2023-01-16 NOTE — Addendum Note (Signed)
Addended by: Vanetta Mulders on: 01/16/2023 09:23 AM   Modules accepted: Orders

## 2023-01-16 NOTE — Telephone Encounter (Signed)
Gastroenterology Pre-Procedure Review  Request Date: 01/21/23 Requesting Physician: Dr. Vicente Males  PATIENT REVIEW QUESTIONS: The patient responded to the following health history questions as indicated:    1. Are you having any GI issues? yes (gas and burping. Referral noted Positive Fecal Immunochemical Test.  This is patients 1st colonoscopy.) 2. Do you have a personal history of Polyps? no 3. Do you have a family history of Colon Cancer or Polyps? yes (aunt colon polyps) 4. Diabetes Mellitus? yes (Patient has been advised to stop Metformin on 01/19/23, take half of her usual dose of insulin the day before.) 5. Joint replacements in the past 12 months?no 6. Major health problems in the past 3 months?no 7. Any artificial heart valves, MVP, or defibrillator?no    MEDICATIONS & ALLERGIES:    Patient reports the following regarding taking any anticoagulation/antiplatelet therapy:   Plavix, Coumadin, Eliquis, Xarelto, Lovenox, Pradaxa, Brilinta, or Effient? no Aspirin? no  Patient confirms/reports the following medications:  Current Outpatient Medications  Medication Sig Dispense Refill   albuterol (VENTOLIN HFA) 108 (90 Base) MCG/ACT inhaler Inhale 2 puffs into the lungs every 6 (six) hours as needed for wheezing or shortness of breath. 8 g 2   ALPRAZolam (XANAX) 0.25 MG tablet Take 1 tablet (0.25 mg total) by mouth 2 (two) times daily as needed for anxiety. 30 tablet 0   aspirin EC 81 MG tablet Take 81 mg by mouth daily. Swallow whole.     atenolol (TENORMIN) 25 MG tablet Take 1 tablet (25 mg total) by mouth daily. 90 tablet 0   baclofen (LIORESAL) 10 MG tablet Take 10 mg by mouth at bedtime.     cloNIDine (CATAPRES) 0.1 MG tablet Take 1 tablet (0.1 mg total) by mouth 3 (three) times daily. You can try decreasing to twice daily next week and after that just at night 90 tablet 0   fluticasone (FLONASE) 50 MCG/ACT nasal spray Place 2 sprays into both nostrils daily as needed for allergies or  rhinitis.     Insulin Degludec-Liraglutide (XULTOPHY) 100-3.6 UNIT-MG/ML SOPN Inject 50 Units into the skin daily. 45 mL 1   Melatonin 10 MG TABS Take 10 mg by mouth at bedtime as needed (sleep).      metFORMIN (GLUCOPHAGE-XR) 750 MG 24 hr tablet Take 2 tablets (1,500 mg total) by mouth daily with breakfast. 180 tablet 1   olmesartan (BENICAR) 40 MG tablet Take 1 tablet (40 mg total) by mouth daily. In place of lisinopril for bp 90 tablet 1   pregabalin (LYRICA) 50 MG capsule Take 1-3 capsules (50-150 mg total) by mouth at bedtime. 90 capsule 0   Vitamin D, Ergocalciferol, (DRISDOL) 1.25 MG (50000 UNIT) CAPS capsule Take 1 capsule (50,000 Units total) by mouth every 7 (seven) days. 12 capsule 1   No current facility-administered medications for this visit.    Patient confirms/reports the following allergies:  Allergies  Allergen Reactions   Simvastatin     Muscle cramps    Zetia [Ezetimibe] Other (See Comments)    Myalgia    Atorvastatin Other (See Comments)    muscle cramps   Compazine Other (See Comments)    Twitching and spasms of neck muscles   Penicillins Rash    Reaction under 25, patient unsure that she still has an allergy    No orders of the defined types were placed in this encounter.   AUTHORIZATION INFORMATION Primary Insurance: 1D#: Group #:  Secondary Insurance: 1D#: Group #:  SCHEDULE INFORMATION: Date: 01/21/23 Time: Location:  [place]

## 2023-01-16 NOTE — Telephone Encounter (Signed)
Returned patients call to schedule her colonoscopy.  LVM for pt to return my call.   Thanks, Elany Felix, CMA 

## 2023-01-19 ENCOUNTER — Telehealth: Payer: Self-pay

## 2023-01-19 NOTE — Telephone Encounter (Signed)
Patient contacted office to see what the next available date would be for her colonoscopy.  Informed her the next date with Dr. Vicente Males will be 01/28/23.  She said she would like to think about it and call me back.  As for now she is still on with Dr. Vicente Males for 01/21/23.  Thanks, [place], [place]

## 2023-01-20 ENCOUNTER — Telehealth: Payer: Self-pay | Admitting: Gastroenterology

## 2023-01-20 NOTE — Telephone Encounter (Signed)
Pts call has been returned.  She could not locate her instructions in her mychart.  Asked her if she remembered to stop her Metformin yesterday and she stated that she forgot.  Informed her that we will need to reschedule her colonoscopy.  She was unable to schedule at this time due to uncertainty with work.  She will call back to reschedule.  Trish in Endo notified.  Thanks,  [place], [place]

## 2023-01-20 NOTE — Telephone Encounter (Signed)
Patient has a few questions about her colonoscopy tomorrow. Requesting call back.

## 2023-01-21 ENCOUNTER — Encounter: Admission: RE | Payer: Self-pay | Source: Home / Self Care

## 2023-01-21 ENCOUNTER — Ambulatory Visit
Admission: RE | Admit: 2023-01-21 | Payer: BC Managed Care – PPO | Source: Home / Self Care | Admitting: Gastroenterology

## 2023-01-21 ENCOUNTER — Encounter: Payer: Self-pay | Admitting: Family Medicine

## 2023-01-21 SURGERY — COLONOSCOPY WITH PROPOFOL
Anesthesia: General

## 2023-01-22 ENCOUNTER — Ambulatory Visit: Payer: BC Managed Care – PPO | Admitting: Family Medicine

## 2023-01-22 NOTE — Progress Notes (Signed)
Name: Lindsay Mcdonald   MRN: [id number]    DOB: January 21, 1961   Date:01/23/2023       Progress Note  Subjective  Chief Complaint  Follow Up  HPI  Anxiety:she is finally off BZD, last dose was 5 days ago. She asked me about something to help her sleep and also to help with anxiety. She asked about Zoloft, explained she tried a long time ago but we can try it again.   FMS: she has a long history of feeling aching and tender worse on trunk and upper extremities. Pain on shoulders, arms, stiff on her hips .Stiffiness is worse when she first gets up in the mornings, she has some mental fogginess, she has tried Lyrica and Duloxetine but stopped on her own, she has intense pain intermittently and worsening of mental fogginess that affects her work. Sometimes she has to leave work early due to pain.   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 Explained it will take 4-6 weeks to start to work, not to be discouraged. Discussed possible side effects. We will add seroquel at night for sleep   OA: seeing Dr. Candelaria Stagers and had steroid injections on both knees ,no effusion or redness,pain resolved ,she has a history of bakers cysts, knees are stiff intermittently but doing better, she has also had injections on both shoulder,  also uses topical medications on her knees  DMII:glucose lately has been elevated due to prednisone  A1C went from 6.6 % to 7.9% to 8.1% ,6.7 %, 7 % 6.6 % , 7.1 % ,8.1 % to 7.1 %, 7.4 % 6.7 % and last visit it was down to 6.5 %  She has been compliant with Xultophy ( sometimes skips by accident)  and also taking Metformin 1500 mg daily No polyphagia, polydipsia or polyuria. 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 She has resumed lyrica but not sure if she will continue taking it    OSA: she has not been back for titration yet, she states not having problems lately, used to gasp for air but states  symptoms not as bad lately Unchanged   HTN: she is now on Benicar 40 mg, no chest pain or palpation . BP is at goal, no side effects of medication. Stable, continue medication    Hyperlipidemia: she has tried multiple statins and zetia but unable to tolerate due to severe muscle aches. She states fish oil is too expensive. Last LDL was 94. 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 Melrose Nakayama, B12 was low, she is still taking B12 injections here   Patient Active Problem List   Diagnosis Date Noted   Hypertension associated with diabetes ([place]) 08/31/2020   History of marijuana use 03/30/2019   Fibromyalgia 12/31/2018   Venous vascular malformations 03/27/2016   Seasonal allergic rhinitis 09/26/2015   Muscle spasms of neck 06/19/2015   Insomnia 06/19/2015   Primary osteoarthritis  of right knee 06/17/2015   Depression, major, recurrent, mild ([place]) 06/17/2015   Hyperlipidemia 06/17/2015   Benign hypertension 06/17/2015   Morbid obesity with BMI of 40.0-44.9, adult ([place]) 06/17/2015   Vitamin D deficiency 06/17/2015    Past Surgical History:  Procedure Laterality Date   BREAST REDUCTION SURGERY     ETHMOIDECTOMY Right 05/16/2020   Procedure: ETHMOIDECTOMY;  Surgeon: Margaretha Sheffield, MD;  Location: [facility] ORS;  Service: ENT;  Laterality: Right;   EXCISION ORAL TUMOR N/A 05/16/2020   Procedure: EXCISION OF SOFT PALATE PAPILLOMA;  Surgeon: Margaretha Sheffield, MD;  Location: [facility] OR[facility];  Service: ENT;  Laterality: N/A;   FRONTAL SINUS EXPLORATION Right  05/16/2020   Procedure: FRONTAL SINUS EXPLORATION;  Surgeon: Margaretha Sheffield, MD;  Location: [facility];  Service: ENT;  Laterality: Right;   IMAGE GUIDED SINUS SURGERY N/A 05/16/2020   Procedure: IMAGE GUIDED SINUS SURGERY;  Surgeon: Margaretha Sheffield, MD;  Location: [facility] OR[facility];  Service: ENT;  Laterality: N/A;   MAXILLARY ANTROSTOMY Bilateral 05/16/2020   Procedure: MAXILLARY ANTROSTOMY with tissue removal;  Surgeon: Margaretha Sheffield, MD;  Location: [facility];  Service: ENT;  Laterality: Bilateral;   nasal endoscopic     OOPHORECTOMY  ?   one ovary removed    REDUCTION MAMMAPLASTY Bilateral 2000   removal of ovary     SPHENOIDECTOMY Right 05/16/2020   Procedure: SPHENOIDECTOMY;  Surgeon: Margaretha Sheffield, MD;  Location: [facility] OR[facility];  Service: ENT;  Laterality: Right;    Family History  Problem Relation Age of Onset   Diabetes Mother    Cancer Mother 51       Breast   Breast cancer Mother 27   Gout Mother    Diabetes Father    Diabetes Daughter        Oldest Daughter   Breast cancer Maternal Aunt 55   Kidney cancer Maternal Aunt    Kidney disease Maternal Aunt    Stroke Maternal Uncle    Cancer Half-Sister 51       breast cancer   Prostate cancer Neg Hx    Bladder Cancer Neg Hx     Social History   Tobacco Use   Smoking status: Never   Smokeless tobacco: Never  Substance Use Topics   Alcohol use: No    Alcohol/week: 0.0 standard drinks of alcohol     Current Outpatient Medications:    albuterol (VENTOLIN HFA) 108 (90 Base) MCG/ACT inhaler, Inhale 2 puffs into the lungs every 6 (six) hours as needed for wheezing or shortness of breath., Disp: 8 g, Rfl: 2   aspirin EC 81 MG tablet, Take 81 mg by mouth daily. Swallow whole., Disp: , Rfl:    atenolol (TENORMIN) 25 MG tablet, Take 1 tablet (25 mg total) by mouth daily., Disp: 90 tablet, Rfl: 0   baclofen (LIORESAL) 10 MG tablet, Take 10 mg by mouth at bedtime., Disp: , Rfl:    cloNIDine (CATAPRES) 0.1 MG tablet, Take 1 tablet (0.1 mg total) by  mouth 3 (three) times daily. You can try decreasing to twice daily next week and after that just at night, Disp: 90 tablet, Rfl: 0   fluticasone (FLONASE) 50 MCG/ACT nasal spray, Place 2 sprays into both nostrils daily as needed for allergies or rhinitis., Disp: , Rfl:    Insulin Degludec-Liraglutide (XULTOPHY) 100-3.6 UNIT-MG/ML SOPN, Inject 50 Units into the skin daily., Disp: 45 mL, Rfl: 1   Melatonin 10 MG TABS, Take 10 mg by mouth at bedtime as needed (sleep). ,  Disp: , Rfl:    metFORMIN (GLUCOPHAGE-XR) 750 MG 24 hr tablet, Take 2 tablets (1,500 mg total) by mouth daily with breakfast., Disp: 180 tablet, Rfl: 1   olmesartan (BENICAR) 40 MG tablet, Take 1 tablet (40 mg total) by mouth daily. In place of lisinopril for bp, Disp: 90 tablet, Rfl: 1   pregabalin (LYRICA) 50 MG capsule, Take 1-3 capsules (50-150 mg total) by mouth at bedtime., Disp: 90 capsule, Rfl: 0   Vitamin D, Ergocalciferol, (DRISDOL) 1.25 MG (50000 UNIT) CAPS capsule, Take 1 capsule (50,000 Units total) by mouth every 7 (seven) days., Disp: 12 capsule, Rfl: 1   ALPRAZolam (XANAX) 0.25 MG tablet, Take 1 tablet (0.25 mg total) by mouth 2 (two) times daily as needed for anxiety. (Patient not taking: Reported on 01/23/2023), Disp: 30 tablet, Rfl: 0  Allergies  Allergen Reactions   Simvastatin     Muscle cramps    Zetia [Ezetimibe] Other (See Comments)    Myalgia    Atorvastatin Other (See Comments)    muscle cramps   Compazine Other (See Comments)    Twitching and spasms of neck muscles   Penicillins Rash    Reaction under 25, patient unsure that she still has an allergy    I personally reviewed active problem list, medication list, allergies, family history, social history, health maintenance with the patient/caregiver today.   ROS  Ten systems reviewed and is negative except as mentioned in HPI   Objective  Vitals:   01/23/23 0939  BP: 134/86  Pulse: 90  Resp: 16  SpO2: 97%  Weight: 274 lb (124.3 kg)   Height: '5\' 4"'$  (1.626 m)    Body mass index is 47.03 kg/m.  Physical Exam  Constitutional: Patient appears well-developed and well-nourished. Obese  No distress.  HEENT: head atraumatic, normocephalic, pupils equal and reactive to light, neck supple Cardiovascular: Normal rate, regular rhythm and normal heart sounds.  No murmur heard. No BLE edema. Pulmonary/Chest: Effort normal and breath sounds normal. No respiratory distress. Abdominal: Soft.  There is no tenderness. Psychiatric: Patient has a normal mood and affect. behavior is normal. Judgment and thought content normal.   Recent Results (from the past 2160 hour(s))  POCT HgB A1C     Status: Abnormal   Collection Time: 11/10/22  2:00 PM  Result Value Ref Range   Hemoglobin A1C 6.5 (A) 4.0 - 5.6 %   HbA1c POC (<> result, manual entry)     HbA1c, POC (prediabetic range)     HbA1c, POC (controlled diabetic range)    Fecal Globin By Immunochemistry     Status: Abnormal   Collection Time: 01/13/23 12:00 AM  Result Value Ref Range   MICRO NUMBER: [id number]    SPECIMEN QUALITY: Adequate    Source: INSURE (TM) FOBT TEST CARD    STATUS: FINAL    FECAL GLOBIN RESULT: Detected (A)     Comment: Detected    PHQ2/9:    01/23/2023    9:38 AM 12/03/2022    7:42 AM 11/10/2022    1:59 PM 08/13/2022    2:33 PM 05/14/2022    8:20 AM  Depression screen PHQ 2/9  Decreased Interest '1 2 2 1 1  '$ Down, Depressed, Hopeless '1 1 1 1 1  '$ PHQ - 2 Score '2 3 3 2 2  '$ Altered sleeping 2 1 0 3 0  Tired, decreased energy 2 1 0 0 0  Change in appetite 3 0  3 0  Feeling bad or failure about  yourself  1 0 '1 3 1  '$ Trouble concentrating 0 0 0 0 0  Moving slowly or fidgety/restless 0 0 0 0 0  Suicidal thoughts 0 0 0 0 0  PHQ-9 Score '10 5 4 11 3    '$ phq 9 is positive   Fall Risk:    01/23/2023    9:38 AM 12/03/2022    7:37 AM 11/10/2022    1:59 PM 08/15/2022    9:47 AM 08/13/2022    2:33 PM  [place] in the past year? 0 0 0 0 0  Number falls  in past yr: 0 0 0 0 0  Injury with Fall? 0 0 0 0 0  Risk for fall due to : No Fall Risks No Fall Risks No Fall Risks No Fall Risks No Fall Risks  Follow up Falls prevention discussed Falls prevention discussed Falls prevention discussed Falls prevention discussed Falls prevention discussed      Functional Status Survey: Is the patient deaf or have difficulty hearing?: No Does the patient have difficulty seeing, even when wearing glasses/contacts?: Yes Does the patient have difficulty concentrating, remembering, or making decisions?: Yes Does the patient have difficulty walking or climbing stairs?: Yes Does the patient have difficulty dressing or bathing?: No Does the patient have difficulty doing errands alone such as visiting a doctor's office or shopping?: No    Assessment & Plan  1. Dyslipidemia associated with type 2 diabetes mellitus (HCC)  - Urine Microalbumin w/creat. ratio  2. Depression, major, recurrent, mild (HCC)  - sertraline (ZOLOFT) 50 MG tablet; Take 0.5-1 tablets (25-50 mg total) by mouth daily.  Dispense: 30 tablet; Refill: 0  3. B12 deficiency  - cyanocobalamin (VITAMIN B12) injection 1,000 mcg  4. Morbid obesity with BMI of 40.0-44.9, adult [facility])  Discussed with the patient the risk posed by an increased BMI. Discussed importance of portion control, calorie counting and at least 150 minutes of physical activity weekly. Avoid sweet beverages and drink more water. Eat at least 6 servings of fruit and vegetables daily    5. Primary insomnia  - QUEtiapine (SEROQUEL) 25 MG tablet; Take 1 tablet (25 mg total) by mouth every evening. For sleep  Dispense: 30 tablet; Refill: 0  6. GAD (generalized anxiety disorder)  - sertraline (ZOLOFT) 50 MG tablet; Take 0.5-1 tablets (25-50 mg total) by mouth daily.  Dispense: 30 tablet; Refill: 0    7. Need for Tdap vaccination  - Tdap vaccine greater than or equal to 7yo IM

## 2023-01-23 ENCOUNTER — Ambulatory Visit: Payer: BC Managed Care – PPO | Admitting: Family Medicine

## 2023-01-23 ENCOUNTER — Encounter: Payer: Self-pay | Admitting: Family Medicine

## 2023-01-23 VITALS — BP 134/86 | HR 90 | Resp 16 | Ht 64.0 in | Wt 274.0 lb

## 2023-01-23 DIAGNOSIS — E1169 Type 2 diabetes mellitus with other specified complication: Secondary | ICD-10-CM

## 2023-01-23 DIAGNOSIS — Z6841 Body Mass Index (BMI) 40.0 and over, adult: Secondary | ICD-10-CM

## 2023-01-23 DIAGNOSIS — E538 Deficiency of other specified B group vitamins: Secondary | ICD-10-CM | POA: Diagnosis not present

## 2023-01-23 DIAGNOSIS — Z23 Encounter for immunization: Secondary | ICD-10-CM

## 2023-01-23 DIAGNOSIS — F411 Generalized anxiety disorder: Secondary | ICD-10-CM

## 2023-01-23 DIAGNOSIS — E785 Hyperlipidemia, unspecified: Secondary | ICD-10-CM | POA: Diagnosis not present

## 2023-01-23 DIAGNOSIS — F5101 Primary insomnia: Secondary | ICD-10-CM

## 2023-01-23 DIAGNOSIS — F330 Major depressive disorder, recurrent, mild: Secondary | ICD-10-CM

## 2023-01-23 MED ORDER — CYANOCOBALAMIN 1000 MCG/ML IJ SOLN
1000.0000 ug | Freq: Once | INTRAMUSCULAR | Status: AC
  Administered 2023-01-23: 1000 ug via INTRAMUSCULAR

## 2023-01-23 MED ORDER — QUETIAPINE FUMARATE 25 MG PO TABS: 25.0000 mg | ORAL_TABLET | Freq: Every evening | ORAL | 0 refills | Status: DC

## 2023-01-23 MED ORDER — SERTRALINE HCL 50 MG PO TABS: 25.0000 mg | ORAL_TABLET | Freq: Every day | ORAL | 0 refills | Status: DC

## 2023-01-24 LAB — MICROALBUMIN / CREATININE URINE RATIO
Creatinine, Urine: 105 mg/dL (ref 20–275)
Microalb Creat Ratio: 2 mcg/mg creat (ref <30)
Microalb, Ur: 0.2 mg/dL

## 2023-01-25 ENCOUNTER — Encounter: Payer: Self-pay | Admitting: Family Medicine

## 2023-01-26 ENCOUNTER — Telehealth: Payer: Self-pay

## 2023-01-26 ENCOUNTER — Encounter: Payer: Self-pay | Admitting: Family Medicine

## 2023-01-26 ENCOUNTER — Other Ambulatory Visit: Payer: Self-pay | Admitting: Family Medicine

## 2023-01-26 DIAGNOSIS — F411 Generalized anxiety disorder: Secondary | ICD-10-CM

## 2023-01-26 DIAGNOSIS — F330 Major depressive disorder, recurrent, mild: Secondary | ICD-10-CM

## 2023-01-26 DIAGNOSIS — F5101 Primary insomnia: Secondary | ICD-10-CM

## 2023-01-26 MED ORDER — QUETIAPINE FUMARATE 25 MG PO TABS: 25.0000 mg | ORAL_TABLET | Freq: Every evening | ORAL | 0 refills | Status: DC

## 2023-01-26 MED ORDER — SERTRALINE HCL 50 MG PO TABS: 50.0000 mg | ORAL_TABLET | Freq: Every day | ORAL | 0 refills | Status: DC

## 2023-01-26 NOTE — Telephone Encounter (Signed)
Patient lvm requesting call back. She requested to see if 02/02/23 is available for rescheduling her colonoscopy.  LVM informing her that 02/02/23 is currently full and requested for her to call me back to look at other dates.  Thanks, [place], [place]

## 2023-01-28 ENCOUNTER — Telehealth: Payer: Self-pay

## 2023-01-28 ENCOUNTER — Encounter: Payer: Self-pay | Admitting: Family Medicine

## 2023-01-28 NOTE — Telephone Encounter (Signed)
Returned patients phone call lvm to let her know 02/03/23 is available.  She will need to call me back to confirm that this is the date she really wants. Thanks, [place], [place]

## 2023-01-28 NOTE — Telephone Encounter (Signed)
Per pt Feb. 13th works for her please schedule procedure

## 2023-01-28 NOTE — Telephone Encounter (Signed)
Patient calling to schedule colonoscopy and is wondering if February 13th is an available date.

## 2023-01-29 ENCOUNTER — Other Ambulatory Visit: Payer: Self-pay

## 2023-01-29 DIAGNOSIS — Z1211 Encounter for screening for malignant neoplasm of colon: Secondary | ICD-10-CM

## 2023-01-29 NOTE — Telephone Encounter (Signed)
Patient has been contacted to let her know her procedure has been scheduled for 02/03/23.  Advised her to stop Metformin 2 days before her procedure on 02/11.  Take blood pressure medication the morning of procedure with only a sip of water.  Thanks, [place], [place]

## 2023-02-02 ENCOUNTER — Encounter: Payer: Self-pay | Admitting: Gastroenterology

## 2023-02-03 ENCOUNTER — Encounter
Admission: RE | Disposition: A | Discharge status: Home / Self Care | Payer: Self-pay | Source: Home / Self Care | Attending: Gastroenterology

## 2023-02-03 ENCOUNTER — Ambulatory Visit: Payer: BC Managed Care – PPO | Admitting: Anesthesiology

## 2023-02-03 ENCOUNTER — Encounter: Payer: Self-pay | Admitting: Gastroenterology

## 2023-02-03 ENCOUNTER — Ambulatory Visit
Admission: RE | Admit: 2023-02-03 | Discharge: 2023-02-03 | Disposition: A | Discharge status: Home / Self Care | Payer: BC Managed Care – PPO | Attending: Gastroenterology | Admitting: Gastroenterology

## 2023-02-03 DIAGNOSIS — D126 Benign neoplasm of colon, unspecified: Secondary | ICD-10-CM | POA: Diagnosis not present

## 2023-02-03 DIAGNOSIS — F32A Depression, unspecified: Secondary | ICD-10-CM | POA: Diagnosis not present

## 2023-02-03 DIAGNOSIS — M797 Fibromyalgia: Secondary | ICD-10-CM | POA: Diagnosis not present

## 2023-02-03 DIAGNOSIS — Z6841 Body Mass Index (BMI) 40.0 and over, adult: Secondary | ICD-10-CM | POA: Insufficient documentation

## 2023-02-03 DIAGNOSIS — K648 Other hemorrhoids: Secondary | ICD-10-CM | POA: Insufficient documentation

## 2023-02-03 DIAGNOSIS — F419 Anxiety disorder, unspecified: Secondary | ICD-10-CM | POA: Insufficient documentation

## 2023-02-03 DIAGNOSIS — R195 Other fecal abnormalities: Secondary | ICD-10-CM | POA: Diagnosis not present

## 2023-02-03 DIAGNOSIS — D122 Benign neoplasm of ascending colon: Secondary | ICD-10-CM | POA: Insufficient documentation

## 2023-02-03 DIAGNOSIS — K219 Gastro-esophageal reflux disease without esophagitis: Secondary | ICD-10-CM | POA: Diagnosis not present

## 2023-02-03 DIAGNOSIS — K635 Polyp of colon: Secondary | ICD-10-CM | POA: Diagnosis not present

## 2023-02-03 DIAGNOSIS — Z1211 Encounter for screening for malignant neoplasm of colon: Secondary | ICD-10-CM

## 2023-02-03 DIAGNOSIS — I10 Essential (primary) hypertension: Secondary | ICD-10-CM | POA: Insufficient documentation

## 2023-02-03 DIAGNOSIS — D120 Benign neoplasm of cecum: Secondary | ICD-10-CM | POA: Insufficient documentation

## 2023-02-03 HISTORY — PX: COLONOSCOPY WITH PROPOFOL: SHX5780

## 2023-02-03 LAB — GLUCOSE, CAPILLARY: Glucose-Capillary: 129 mg/dL — ABNORMAL HIGH (ref 70–99)

## 2023-02-03 SURGERY — COLONOSCOPY WITH PROPOFOL
Anesthesia: General

## 2023-02-03 MED ORDER — STERILE WATER FOR IRRIGATION IR SOLN
Status: DC | PRN
  Administered 2023-02-03: 180 mL

## 2023-02-03 MED ORDER — SODIUM CHLORIDE 0.9 % IV SOLN: INTRAVENOUS | Status: DC

## 2023-02-03 MED ORDER — PROPOFOL 10 MG/ML IV BOLUS
INTRAVENOUS | Status: AC
  Filled 2023-02-03: qty 20

## 2023-02-03 MED ORDER — PROPOFOL 10 MG/ML IV BOLUS
INTRAVENOUS | Status: DC | PRN
  Administered 2023-02-03: 20 mg via INTRAVENOUS
  Administered 2023-02-03: 40 mg via INTRAVENOUS

## 2023-02-03 MED ORDER — PROPOFOL 500 MG/50ML IV EMUL
INTRAVENOUS | Status: DC | PRN
  Administered 2023-02-03: 125 ug/kg/min via INTRAVENOUS

## 2023-02-03 MED ORDER — LIDOCAINE HCL (CARDIAC) PF 100 MG/5ML IV SOSY
PREFILLED_SYRINGE | INTRAVENOUS | Status: DC | PRN
  Administered 2023-02-03: 80 mg via INTRAVENOUS

## 2023-02-03 NOTE — Op Note (Signed)
[facility] Patient Name: Lindsay Mcdonald Procedure Date: 02/03/2023 9:38 AM MRN: [id number] Account #: [id number] Date of Birth: [birth year] Admit Type: Outpatient Age: 62 Room: [facility] ENDO ROOM 2 Gender: Female Note Status: Finalized Instrument Name: Jasper Riling [id number] Procedure:             Colonoscopy Indications:           Heme positive stool Providers:             Jonathon Bellows MD, MD Medicines:             Monitored Anesthesia Care Complications:         No immediate complications. Procedure:             Pre-Anesthesia Assessment:                        - Prior to the procedure, a History and Physical was                         performed, and patient medications, allergies and                         sensitivities were reviewed. The patient's tolerance                         of previous anesthesia was reviewed.                        - The risks and benefits of the procedure and the                         sedation options and risks were discussed with the                         patient. All questions were answered and informed                         consent was obtained.                        - ASA Grade Assessment: II - A patient with mild                         systemic disease.                        After obtaining informed consent, the colonoscope was                         passed under direct vision. Throughout the procedure,                         the patient's blood pressure, pulse, and oxygen                         saturations were monitored continuously. The                         Colonoscope was introduced through the anus and  advanced to the the cecum, identified by the                         appendiceal orifice. The colonoscopy was performed                         with ease. The patient tolerated the procedure well.                         The quality of the bowel preparation was excellent.                          The ileocecal valve, appendiceal orifice, and rectum                         were photographed. Findings:      The perianal and digital rectal examinations were normal.      Two sessile polyps were found in the ascending colon and cecum. The       polyps were 5 to 7 mm in size. These polyps were removed with a cold       snare. Resection and retrieval were complete.      The exam was otherwise without abnormality on direct and retroflexion       views.      Non-bleeding internal hemorrhoids were found during retroflexion. The       hemorrhoids were large and Grade I (internal hemorrhoids that do not       prolapse). Impression:            - Two 5 to 7 mm polyps in the ascending colon and in                         the cecum, removed with a cold snare. Resected and                         retrieved.                        - The examination was otherwise normal on direct and                         retroflexion views. Recommendation:        - Discharge patient to home (with escort).                        - Resume previous diet.                        - Continue present medications.                        - Await pathology results.                        - Repeat colonoscopy for surveillance based on                         pathology results. Procedure Code(s):     --- Professional ---  45385, Colonoscopy, flexible; with removal of                         tumor(s), polyp(s), or other lesion(s) by snare                         technique Diagnosis Code(s):     --- Professional ---                        D12.2, Benign neoplasm of ascending colon                        D12.0, Benign neoplasm of cecum                        R19.5, Other fecal abnormalities CPT copyright 2022 [employer] Association. All rights reserved. The codes documented in this report are preliminary and upon coder review may  be revised to meet current compliance  requirements. Jonathon Bellows, MD Jonathon Bellows MD, MD 02/03/2023 10:11:04 AM This report has been signed electronically. Number of Addenda: 0 Note Initiated On: 02/03/2023 9:38 AM Scope Withdrawal Time: 0 hours 13 minutes 32 seconds  Total Procedure Duration: 0 hours 18 minutes 26 seconds  Estimated Blood Loss:  Estimated blood loss: none.      [facility]

## 2023-02-03 NOTE — Anesthesia Procedure Notes (Signed)
Procedure Name: MAC Date/Time: 02/03/2023 9:45 AM  Performed by: Hilbert Odor, CRNAPre-anesthesia Checklist: Patient identified, Emergency Drugs available, Suction available, Patient being monitored and Timeout performed Patient Re-evaluated:Patient Re-evaluated prior to induction Oxygen Delivery Method: Simple face mask

## 2023-02-03 NOTE — H&P (Signed)
Jonathon Bellows, MD [address], [place], [place], [place], [address] [address], [place], [place], [place], [address] Phone: [phone]  Fax: [phone]  Primary Care Physician:  Steele Sizer, MD   Pre-Procedure History & Physical: HPI:  Lindsay Mcdonald is a 62 y.o. female is here for an colonoscopy.   Past Medical History:  Diagnosis Date   Anxiety    Arthritis 03/2020   osteoarthritis both knees   Chronic pain of right knee    Chronic sinusitis    DDD (degenerative disc disease), cervical    cervical radiculopathy   Depression    Diabetes mellitus    insulin dependant   Fibromyalgia    GERD (gastroesophageal reflux disease)    High cholesterol    Hyperlipidemia    Hypertension    Insomnia    Muscle pain    Nasal polyp    Neuromuscular disorder ([place])    diabetic neuropathy   Obesity    Palpitations    Tendonitis of knee, left 04/2020   received steroid injection   Vitamin D deficiency    Vitamin D deficiency     Past Surgical History:  Procedure Laterality Date   BREAST REDUCTION SURGERY     ETHMOIDECTOMY Right 05/16/2020   Procedure: ETHMOIDECTOMY;  Surgeon: Margaretha Sheffield, MD;  Location: [facility];  Service: ENT;  Laterality: Right;   EXCISION ORAL TUMOR N/A 05/16/2020   Procedure: EXCISION OF SOFT PALATE PAPILLOMA;  Surgeon: Margaretha Sheffield, MD;  Location: [facility];  Service: ENT;  Laterality: N/A;   FRONTAL SINUS EXPLORATION Right 05/16/2020   Procedure: FRONTAL SINUS EXPLORATION;  Surgeon: Margaretha Sheffield, MD;  Location: [facility];  Service: ENT;  Laterality: Right;   IMAGE GUIDED SINUS SURGERY N/A 05/16/2020   Procedure: IMAGE GUIDED SINUS SURGERY;  Surgeon: Margaretha Sheffield, MD;  Location: [facility];  Service: ENT;  Laterality: N/A;   MAXILLARY ANTROSTOMY Bilateral 05/16/2020   Procedure: MAXILLARY ANTROSTOMY with tissue removal;  Surgeon: Margaretha Sheffield, MD;  Location: [facility];  Service: ENT;  Laterality: Bilateral;   nasal endoscopic     OOPHORECTOMY   ?   one ovary removed    REDUCTION MAMMAPLASTY Bilateral 2000   removal of ovary     SPHENOIDECTOMY Right 05/16/2020   Procedure: SPHENOIDECTOMY;  Surgeon: Margaretha Sheffield, MD;  Location: [facility];  Service: ENT;  Laterality: Right;    Prior to Admission medications   Medication Sig Start Date End Date Taking? Authorizing Provider  Insulin Degludec-Liraglutide (XULTOPHY) 100-3.6 UNIT-MG/ML SOPN Inject 50 Units into the skin daily. 11/10/22  Yes Sowles, Drue Stager, MD  olmesartan (BENICAR) 40 MG tablet Take 1 tablet (40 mg total) by mouth daily. In place of lisinopril for bp 11/10/22  Yes Sowles, Drue Stager, MD  pregabalin (LYRICA) 50 MG capsule Take 1-3 capsules (50-150 mg total) by mouth at bedtime. 08/13/22  Yes Sowles, Drue Stager, MD  sertraline (ZOLOFT) 50 MG tablet Take 1 tablet (50 mg total) by mouth daily. 01/26/23  Yes Sowles, Drue Stager, MD  albuterol (VENTOLIN HFA) 108 (90 Base) MCG/ACT inhaler Inhale 2 puffs into the lungs every 6 (six) hours as needed for wheezing or shortness of breath. 01/16/21   Jacquelin Hawking, NP  aspirin EC 81 MG tablet Take 81 mg by mouth daily. Swallow whole.    [provider]  baclofen (LIORESAL) 10 MG tablet Take 10 mg by mouth at bedtime.    Anabel Bene, MD  fluticasone [facility]) 50 MCG/ACT nasal spray Place 2 sprays  into both nostrils daily as needed for allergies or rhinitis.    [provider]  Melatonin 10 MG TABS Take 10 mg by mouth at bedtime as needed (sleep).     [provider]  metFORMIN (GLUCOPHAGE-XR) 750 MG 24 hr tablet Take 2 tablets (1,500 mg total) by mouth daily with breakfast. 11/10/22   Steele Sizer, MD  QUEtiapine (SEROQUEL) 25 MG tablet Take 1 tablet (25 mg total) by mouth every evening. For sleep 01/26/23   Steele Sizer, MD  Vitamin D, Ergocalciferol, (DRISDOL) 1.25 MG (50000 UNIT) CAPS capsule Take 1 capsule (50,000 Units total) by mouth every 7 (seven) days. 11/10/22   Steele Sizer, MD    Allergies as of  01/29/2023 - Review Complete 01/23/2023  Allergen Reaction Noted   Simvastatin  05/14/2020   Zetia [ezetimibe] Other (See Comments) 05/14/2022   Atorvastatin Other (See Comments) 06/17/2015   Compazine Other (See Comments) 12/13/2011   Penicillins Rash 07/19/2012    Family History  Problem Relation Age of Onset   Diabetes Mother    Cancer Mother 67       Breast   Breast cancer Mother 32   Gout Mother    Diabetes Father    Diabetes Daughter        Oldest Daughter   Breast cancer Maternal Aunt 55   Kidney cancer Maternal Aunt    Kidney disease Maternal Aunt    Stroke Maternal Uncle    Cancer Half-Sister 84       breast cancer   Prostate cancer Neg Hx    Bladder Cancer Neg Hx     Social History   Socioeconomic History   Marital status: Divorced    Spouse name: Not on file   Number of children: 2   Years of education: Not on file   Highest education level: Some college, no degree  Occupational History   Occupation: Designer, television/film set   Tobacco Use   Smoking status: Never   Smokeless tobacco: Never  Vaping Use   Vaping Use: Former  Substance and Sexual Activity   Alcohol use: No    Alcohol/week: 0.0 standard drinks of alcohol   Drug use: No   Sexual activity: Not Currently    Partners: Male    Birth control/protection: None  Other Topics Concern   Not on file  Social History Narrative   Divorced and he died since the divorce   Working at Glass blower/designer.    Social Determinants of Health   Financial Resource Strain: Medium Risk (04/16/2022)   Overall Financial Resource Strain (CARDIA)    Difficulty of Paying Living Expenses: Somewhat hard  Food Insecurity: Food Insecurity Present (04/16/2022)   Hunger Vital Sign    Worried About Running Out of Food in the Last Year: Sometimes true    Ran Out of Food in the Last Year: Sometimes true  Transportation Needs: No Transportation Needs (04/16/2022)   PRAPARE - Hydrologist (Medical): No     Lack of Transportation (Non-Medical): No  Physical Activity: Insufficiently Active (04/16/2022)   Exercise Vital Sign    Days of Exercise per Week: 2 days    Minutes of Exercise per Session: 10 min  Stress: No Stress Concern Present (04/16/2022)   [place]    Feeling of Stress : Only a little  Social Connections: Unknown (04/16/2022)   Social Connection and Isolation Panel [NHANES]    Frequency of Communication with Friends  and Family: More than three times a week    Frequency of Social Gatherings with Friends and Family: Patient refused    Attends Religious Services: Patient refused    Active Member of Clubs or Organizations: Yes    Attends Archivist Meetings: Never    Marital Status: Divorced  Human resources officer Violence: Not At Risk (04/16/2022)   Humiliation, Afraid, Rape, and Kick questionnaire    Fear of Current or Ex-Partner: No    Emotionally Abused: No    Physically Abused: No    Sexually Abused: No    Review of Systems: See HPI, otherwise negative ROS  Physical Exam: BP (!) 149/104   Pulse 75   Temp (!) 96.7 F (35.9 C) (Temporal)   Wt 121.6 kg   SpO2 100%   BMI 46.00 kg/m  General:   Alert,  pleasant and cooperative in NAD Head:  Normocephalic and atraumatic. Neck:  Supple; no masses or thyromegaly. Lungs:  Clear throughout to auscultation, normal respiratory effort.    Heart:  +S1, +S2, Regular rate and rhythm, No edema. Abdomen:  Soft, nontender and nondistended. Normal bowel sounds, without guarding, and without rebound.   Neurologic:  Alert and  oriented x4;  grossly normal neurologically.  Impression/Plan: Lindsay Mcdonald is here for an colonoscopy to be performed for abnormal stool test   Risks, benefits, limitations, and alternatives regarding  colonoscopy have been reviewed with the patient.  Questions have been answered.  All parties agreeable.   Jonathon Bellows, MD  02/03/2023,  9:39 AM

## 2023-02-03 NOTE — Anesthesia Preprocedure Evaluation (Addendum)
Anesthesia Evaluation  Patient identified by MRN, date of birth, ID band Patient awake    Reviewed: Allergy & Precautions, NPO status , Patient's Chart, lab work & pertinent test results  History of Anesthesia Complications Negative for: history of anesthetic complications  Airway Mallampati: I   Neck ROM: Full    Dental  (+) Missing   Pulmonary neg pulmonary ROS   Pulmonary exam normal breath sounds clear to auscultation       Cardiovascular hypertension, Normal cardiovascular exam Rhythm:Regular Rate:Normal     Neuro/Psych  PSYCHIATRIC DISORDERS Anxiety Depression     Neuromuscular disease (neuropathy) negative neurological ROS     GI/Hepatic ,GERD  ,,  Endo/Other  diabetes, Type 2  Class 3 obesity  Renal/GU negative Renal ROS     Musculoskeletal  (+) Arthritis ,  Fibromyalgia -  Abdominal   Peds  Hematology negative hematology ROS (+)   Anesthesia Other Findings   Reproductive/Obstetrics                             Anesthesia Physical Anesthesia Plan  ASA: 3  Anesthesia Plan: General   Post-op Pain Management:    Induction: Intravenous  PONV Risk Score and Plan: 3 and Propofol infusion, TIVA and Treatment may vary due to age or medical condition  Airway Management Planned: Natural Airway  Additional Equipment:   Intra-op Plan:   Post-operative Plan:   Informed Consent: I have reviewed the patients History and Physical, chart, labs and discussed the procedure including the risks, benefits and alternatives for the proposed anesthesia with the patient or authorized representative who has indicated his/her understanding and acceptance.       Plan Discussed with: CRNA  Anesthesia Plan Comments: (LMA/GETA backup discussed.  Patient consented for risks of anesthesia including but not limited to:  - adverse reactions to medications - damage to eyes, teeth, lips or other  oral mucosa - nerve damage due to positioning  - sore throat or hoarseness - damage to heart, brain, nerves, lungs, other parts of body or loss of life  Informed patient about role of CRNA in peri- and intra-operative care.  Patient voiced understanding.)       Anesthesia Quick Evaluation

## 2023-02-03 NOTE — Anesthesia Postprocedure Evaluation (Signed)
Anesthesia Post Note  Patient: Lindsay Mcdonald  Procedure(s) Performed: COLONOSCOPY WITH PROPOFOL  Patient location during evaluation: PACU Anesthesia Type: General Level of consciousness: awake and alert, oriented and patient cooperative Pain management: pain level controlled Vital Signs Assessment: post-procedure vital signs reviewed and stable Respiratory status: spontaneous breathing, nonlabored ventilation and respiratory function stable Cardiovascular status: blood pressure returned to baseline and stable Postop Assessment: adequate PO intake Anesthetic complications: no   No notable events documented.   Last Vitals:  Vitals:   02/03/23 1013 02/03/23 1033  BP: (!) 159/84 (!) 149/92  Pulse:    Temp: (!) 36.1 C   SpO2:      Last Pain:  Vitals:   02/03/23 1033  TempSrc:   PainSc: 0-No pain                 Darrin Nipper

## 2023-02-03 NOTE — Transfer of Care (Signed)
Immediate Anesthesia Transfer of Care Note  Patient: Lindsay Mcdonald  Procedure(s) Performed: COLONOSCOPY WITH PROPOFOL  Patient Location: PACU  Anesthesia Type:General  Level of Consciousness: awake and drowsy  Airway & Oxygen Therapy: Patient Spontanous Breathing and Patient connected to face mask oxygen  Post-op Assessment: Report given to RN and Post -op Vital signs reviewed and stable  Post vital signs: stable  Last Vitals:  Vitals Value Taken Time  BP 159/84 02/03/23 1013  Temp 36.1 C 02/03/23 1013  Pulse 84 02/03/23 1014  Resp 28 02/03/23 1014  SpO2 99 % 02/03/23 1014  Vitals shown include unvalidated device data.  Last Pain:  Vitals:   02/03/23 1013  TempSrc: Temporal         Complications: No notable events documented.

## 2023-02-04 ENCOUNTER — Encounter: Payer: Self-pay | Admitting: Gastroenterology

## 2023-02-04 LAB — SURGICAL PATHOLOGY

## 2023-02-05 ENCOUNTER — Encounter: Payer: Self-pay | Admitting: Gastroenterology

## 2023-02-10 ENCOUNTER — Encounter: Payer: Self-pay | Admitting: Family Medicine

## 2023-02-26 ENCOUNTER — Encounter: Payer: Self-pay | Admitting: Family Medicine

## 2023-02-27 ENCOUNTER — Ambulatory Visit: Payer: BC Managed Care – PPO | Admitting: Family Medicine

## 2023-02-27 ENCOUNTER — Encounter: Payer: Self-pay | Admitting: Family Medicine

## 2023-02-27 VITALS — BP 116/74 | HR 90 | Temp 98.0°F | Resp 16 | Ht 64.0 in | Wt 267.9 lb

## 2023-02-27 DIAGNOSIS — H109 Unspecified conjunctivitis: Secondary | ICD-10-CM

## 2023-02-27 DIAGNOSIS — R2232 Localized swelling, mass and lump, left upper limb: Secondary | ICD-10-CM

## 2023-02-27 DIAGNOSIS — G4700 Insomnia, unspecified: Secondary | ICD-10-CM | POA: Diagnosis not present

## 2023-02-27 MED ORDER — CROMOLYN SODIUM 4 % OP SOLN: 1.0000 [drp] | Freq: Four times a day (QID) | OPHTHALMIC | 2 refills | Status: DC | PRN

## 2023-02-27 NOTE — Patient Instructions (Addendum)
Red irritated eyes can be caused by a variety of things - dry eye, allergies, viruses  Try the allergy eye drops and cold or warm compresses and see if symptoms improve.  You may also try lumify over the counter which helps with soothing eye and decreasing redness.  If you try the seroquel at a half dose and you still have the side effects and do not want to take it - please let us know and we could try trazodone again or hydroxyzine

## 2023-02-27 NOTE — Progress Notes (Unsigned)
Patient ID: Lindsay Mcdonald, female    DOB: 05/08/1961, 62 y.o.   MRN: [id number]  PCP: Steele Sizer, MD  Chief Complaint  Patient presents with   Hand Problem    On left hand index finger has a bruise/lump does not hurt pt concerned   Eye Problem    Bilateral eyes getting red in the morning/pm.    Subjective:   Lindsay Mcdonald is a 62 y.o. female, presents to clinic with CC of the following:  HPI  Pt presents for red eyes, onset over a week ago, worse in am and pm, contact wearer Hx of seasonal allergies [facility] soft contacts wears them 30 d  She denies eye pain, blurred vision or purulent discharge from eye  Also bump/bruise on index finger, no known injury, no pain or decreased ROM/sensation, pt concerned Over 2 weeks, was bigger and more sore when she first noticed it, has gotten smaller and less sore  She also has questions about her insomnia and medication she was recently prescribed she was afraid to take it and took half a dose only 1 time -most recent was Seroquel   Patient Active Problem List   Diagnosis Date Noted   Occult blood positive stool 02/03/2023   Adenomatous polyp of colon 02/03/2023   Hypertension associated with diabetes ([place]) 08/31/2020   History of marijuana use 03/30/2019   Fibromyalgia 12/31/2018   Venous vascular malformations 03/27/2016   Seasonal allergic rhinitis 09/26/2015   Muscle spasms of neck 06/19/2015   Insomnia 06/19/2015   Primary osteoarthritis of right knee 06/17/2015   Depression, major, recurrent, mild ([place]) 06/17/2015   Hyperlipidemia 06/17/2015   Benign hypertension 06/17/2015   Morbid obesity with BMI of 40.0-44.9, adult ([place]) 06/17/2015   Vitamin D deficiency 06/17/2015      Current Outpatient Medications:    albuterol (VENTOLIN HFA) 108 (90 Base) MCG/ACT inhaler, Inhale 2 puffs into the lungs every 6 (six) hours as needed for wheezing or shortness of breath., Disp: 8 g, Rfl: 2   aspirin EC 81 MG  tablet, Take 81 mg by mouth daily. Swallow whole., Disp: , Rfl:    baclofen (LIORESAL) 10 MG tablet, Take 10 mg by mouth at bedtime., Disp: , Rfl:    fluticasone (FLONASE) 50 MCG/ACT nasal spray, Place 2 sprays into both nostrils daily as needed for allergies or rhinitis., Disp: , Rfl:    Insulin Degludec-Liraglutide (XULTOPHY) 100-3.6 UNIT-MG/ML SOPN, Inject 50 Units into the skin daily., Disp: 45 mL, Rfl: 1   Melatonin 10 MG TABS, Take 10 mg by mouth at bedtime as needed (sleep). , Disp: , Rfl:    metFORMIN (GLUCOPHAGE-XR) 750 MG 24 hr tablet, Take 2 tablets (1,500 mg total) by mouth daily with breakfast., Disp: 180 tablet, Rfl: 1   olmesartan (BENICAR) 40 MG tablet, Take 1 tablet (40 mg total) by mouth daily. In place of lisinopril for bp, Disp: 90 tablet, Rfl: 1   pregabalin (LYRICA) 50 MG capsule, Take 1-3 capsules (50-150 mg total) by mouth at bedtime., Disp: 90 capsule, Rfl: 0   QUEtiapine (SEROQUEL) 25 MG tablet, Take 1 tablet (25 mg total) by mouth every evening. For sleep, Disp: 90 tablet, Rfl: 0   sertraline (ZOLOFT) 50 MG tablet, Take 1 tablet (50 mg total) by mouth daily., Disp: 90 tablet, Rfl: 0   Vitamin D, Ergocalciferol, (DRISDOL) 1.25 MG (50000 UNIT) CAPS capsule, Take 1 capsule (50,000 Units total) by mouth every 7 (seven) days., Disp: 12 capsule, Rfl: 1  Allergies  Allergen Reactions   Simvastatin     Muscle cramps    Zetia [Ezetimibe] Other (See Comments)    Myalgia    Atorvastatin Other (See Comments)    muscle cramps   Compazine Other (See Comments)    Twitching and spasms of neck muscles   Penicillins Rash    Reaction under 25, patient unsure that she still has an allergy     Social History   Tobacco Use   Smoking status: Never   Smokeless tobacco: Never  Vaping Use   Vaping Use: Former  Substance Use Topics   Alcohol use: No    Alcohol/week: 0.0 standard drinks of alcohol   Drug use: No      Chart Review Today: I personally reviewed active problem  list, medication list, allergies, family history, social history, health maintenance, notes from last encounter, lab results, imaging with the patient/caregiver today.   Review of Systems  Constitutional: Negative.   HENT: Negative.    Eyes: Negative.   Respiratory: Negative.    Cardiovascular: Negative.   Gastrointestinal: Negative.   Endocrine: Negative.   Genitourinary: Negative.   Musculoskeletal: Negative.   Skin: Negative.   Allergic/Immunologic: Negative.   Neurological: Negative.   Hematological: Negative.   Psychiatric/Behavioral: Negative.    All other systems reviewed and are negative.      Objective:   Vitals:   02/27/23 1323  BP: 116/74  Pulse: 90  Resp: 16  Temp: 98 F (36.7 C)  TempSrc: Oral  SpO2: 96%  Weight: 267 lb 14.4 oz (121.5 kg)  Height: '5\' 4"'$  (1.626 m)    Body mass index is 45.98 kg/m.  Physical Exam Vitals and nursing note reviewed.  Constitutional:      Appearance: She is well-developed. She is obese.  HENT:     Head: Normocephalic and atraumatic.     Nose: Nose normal.  Eyes:     General: Lids are normal. No allergic shiner or scleral icterus.       Right eye: No foreign body or discharge.        Left eye: No foreign body or discharge.     Extraocular Movements: Extraocular movements intact.     Right eye: Normal extraocular motion.     Left eye: Normal extraocular motion.     Conjunctiva/sclera: Conjunctivae normal.     Right eye: No chemosis, exudate or hemorrhage.    Left eye: No chemosis, exudate or hemorrhage. Neck:     Trachea: No tracheal deviation.  Cardiovascular:     Rate and Rhythm: Normal rate and regular rhythm.  Pulmonary:     Effort: Pulmonary effort is normal. No respiratory distress.     Breath sounds: No stridor.  Musculoskeletal:        General: Normal range of motion.  Skin:    General: Skin is warm and dry.     Findings: No rash.  Neurological:     Mental Status: She is alert.     Motor: No abnormal  muscle tone.     Coordination: Coordination normal.  Psychiatric:        Attention and Perception: Attention normal.        Mood and Affect: Affect normal. Mood is anxious.        Behavior: Behavior normal. Behavior is cooperative.      Results for orders placed or performed during the hospital encounter of 02/03/23  Glucose, capillary  Result Value Ref Range   Glucose-Capillary 129 (H) 70 -  99 mg/dL  Surgical pathology  Result Value Ref Range   SURGICAL PATHOLOGY      SURGICAL PATHOLOGY CASE: [id number] PATIENT: Christain [place] Surgical Pathology Report     Specimen Submitted: A. Colon polyp, cecum; cold snare B. Colon polyp, ascending; cold snare  Clinical History: Screening colonoscopy, positive FIT.  Colon polyps      DIAGNOSIS: A.  COLON POLYP, CECUM; COLD SNARE: - SESSILE SERRATED POLYP. - NEGATIVE FOR DYSPLASIA AND MALIGNANCY.  B.  COLON POLYP, ASCENDING; COLD SNARE: - SESSILE SERRATED POLYP. - NEGATIVE FOR DYSPLASIA AND MALIGNANCY.  GROSS DESCRIPTION: A. Labeled: Cold snare cecal polyp Received: Formalin Collection time: 9:19 AM on 02/03/2023 Placed into formalin time: 9:19 AM on 02/03/2023 Tissue fragment(s): 4 Size: Aggregate, 2 x 0.5 x 0.1 cm Description: Received are 3 fragments of white-tan elongated soft tissue admixed with a fragment intestinal debris.  The ratio of soft tissue to intestinal debris is 90: 10. Entirely submitted in 1 cassette.  B. Labeled: Cold snare ascending colon polyp Receive d: Formalin Collection time: 10:01 AM on 02/03/2023 Placed into formalin time: 10:01 AM on 02/03/2023 Tissue fragment(s): Multiple Size: Aggregate, 0.9 x 0.3 x 0.1 cm Description: White translucent soft tissue fragments Entirely submitted in 1 cassette.  RB 02/03/2023  Final Diagnosis performed by Quay Burow, MD.   Electronically signed 02/04/2023 9:23:53AM The electronic signature indicates that the named Attending Pathologist has evaluated  the specimen Technical component performed at [facility] [address] [place], [place] [address] Lab: [phone] Dir: Rush Farmer, MD, MMM  Professional component performed at [facility], [facility], [place], [place], [place] [address] Lab: [phone] Dir: Kathi Simpers, MD        Assessment & Plan:     ICD-10-CM   1. Conjunctivitis of both eyes, unspecified conjunctivitis type  H10.9 cromolyn (OPTICROM) 4 % ophthalmic solution   encouraged her to try allergy eye drops, lubricants safe for contacts if still having some irritation, no concerning erythema/injection Suspect allergic versus dry eye Encouraged her to have good contact hygiene Avoid rubbing her eyes Encouraged warm or cold compresses  I seriously doubt any bacterial infection or corneal abrasion she has very minimal symptoms and injection on exam today no decreased vision eye pain or purulence  Encouraged her to stay away from Vis[place], can try lubricating eyedrops that are safe for contacts, make sure to use preservative-free eyedrops, can try prescription cromolyn first  Encouraged her to follow-up with her eye doctor if she has any decreased vision, eye pain new discharge redness or swelling    2. Nodule of finger of left hand  R22.32    located PIP, nonmobile nodule, firm, appears bruised, minimally tender, no redness or warmth, has gradually improved, possible hematoma? She has normal range of motion sensation and capillary refill Although she does not know what happened to first because the bump to appear it has been gradually improving and the tenderness has improved as well -watchful waiting.  It may be a hematoma or a burst blood vessel and it will gradually resolve on its own did encourage her to follow-up if she has any new redness swelling or pain    3. Insomnia, unspecified type  G47.00    seroquel caused some palpitations, try half dose, f/up up with SE/tolerance/effectiveness, can  try trazodone or hydroxyzine Encouraged sleep hygiene and management of anxiety symptoms  She has tried melatonin and magnesium as well          Delsa Grana,  PA-C 02/27/23 1:35 PM

## 2023-03-03 ENCOUNTER — Encounter: Payer: Self-pay | Admitting: Family Medicine

## 2023-03-12 ENCOUNTER — Encounter: Payer: Self-pay | Admitting: Family Medicine

## 2023-03-23 DIAGNOSIS — Z419 Encounter for procedure for purposes other than remedying health state, unspecified: Secondary | ICD-10-CM | POA: Diagnosis not present

## 2023-04-07 ENCOUNTER — Encounter: Payer: Self-pay | Admitting: Family Medicine

## 2023-04-17 ENCOUNTER — Encounter: Payer: Self-pay | Admitting: Family Medicine

## 2023-04-19 ENCOUNTER — Other Ambulatory Visit: Payer: Self-pay | Admitting: Family Medicine

## 2023-04-19 DIAGNOSIS — F411 Generalized anxiety disorder: Secondary | ICD-10-CM

## 2023-04-19 DIAGNOSIS — F330 Major depressive disorder, recurrent, mild: Secondary | ICD-10-CM

## 2023-04-22 DIAGNOSIS — Z419 Encounter for procedure for purposes other than remedying health state, unspecified: Secondary | ICD-10-CM | POA: Diagnosis not present

## 2023-04-27 ENCOUNTER — Encounter: Payer: Self-pay | Admitting: Family Medicine

## 2023-04-27 ENCOUNTER — Ambulatory Visit: Payer: Self-pay

## 2023-04-27 NOTE — Telephone Encounter (Signed)
Summary: Right breast itching   Patient states that she has been having issues with her right breast/nipple itching for 3 weeks. Patient denies having any rash or redness on the breast. Patient wants to know if she needs to be seen or if she can be sent for another mammogram. Please advise.     Left message to call back about symptoms.

## 2023-04-27 NOTE — Telephone Encounter (Signed)
3rd attempt to contact patient [phone] to review sx of  breast itching and request for another mammogram. No answer LVMTCB.    Reason for Disposition  Third attempt to contact caller AND no contact made. Phone number verified.  Answer Assessment - Initial Assessment Questions N/A No contact to review sx  Protocols used: No Contact or Duplicate Contact Call-A-AH

## 2023-04-27 NOTE — Telephone Encounter (Signed)
Second attempt to reach pt. Left message to call back. 

## 2023-04-28 NOTE — Telephone Encounter (Signed)
  Chief Complaint: breast itching Symptoms: R breat and nipple itching Frequency: 3 weeks Pertinent Negatives: Patient denies redness pain or discharge  Disposition: [] ED /[] Urgent Care (no appt availability in office) / [x] Appointment(In office/virtual)/ []  [facility]/ [] Home Care/ [] Refused Recommended Disposition /[] [facility]/ []  Follow-up with PCP Additional Notes: pt calling back, advised she needed to be seen for appt first. Pt preferred to keep OV with Dr. Carlynn Purl on 05/15/23 at this time. Did offer sooner appt for this week with other provider and also on 05/06/23 with PCP but pt declined. Will call back if sx get worse.      Summary: Right breast itching     Patient states that she has been having issues with her right breast/nipple itching for 3 weeks. Patient denies having any rash or redness on the breast. Patient wants to know if she needs to be seen or if she can be sent for another mammogram. Please advise.

## 2023-04-29 ENCOUNTER — Encounter: Payer: Self-pay | Admitting: Family Medicine

## 2023-05-05 ENCOUNTER — Telehealth: Payer: Self-pay | Admitting: Family Medicine

## 2023-05-05 ENCOUNTER — Encounter: Payer: Self-pay | Admitting: Family Medicine

## 2023-05-05 ENCOUNTER — Telehealth: Payer: Self-pay

## 2023-05-05 NOTE — Telephone Encounter (Signed)
Lindsay Mcdonald was contacted. Call reference number: [id number] with a PA approval outcome for 01/22/2023-02/19/2023. PA number: [id number]. Patient notified.  Patient received an Audit letter from [facility] due to PA for Insulin Degludec-Liraglutide (XULTOPHY) 100-3.6 UNIT-MG/ML SOPN. [employer] states patient was not insured/covered by [employer] and she would be responsible for $2,7062.37.    Per patient medication was prescribed by PCP on 2/2/204.    Patient did confirm her last day of employment was 02/05/2023.   Patient picked up medication on 2/19 from pharmacy.    Patient was covered by Medicaid Direct from 2/1-2/29 and was advised by insurance to contact PCP and have her resubmit PA through [facility] Direct.   Medicaid Direct Member ID # [id number] /  phone # [phone]      Patient current insurance effective 5/1 Lindsay Mcdonald [facility] Cross Member ID # [id number] M / Group # [id number]

## 2023-05-05 NOTE — Telephone Encounter (Signed)
Patient received an Audit letter from [facility] due to PA for Insulin Degludec-Liraglutide (XULTOPHY) 100-3.6 UNIT-MG/ML SOPN. [employer] states patient was not insured/covered by [employer] and she would be responsible for $6,0454.09.   Per patient medication was prescribed by PCP on 2/2/204.   Patient did confirm her last day of employment was 02/05/2023.  Patient picked up medication on 2/19 from pharmacy.   Patient was covered by Medicaid Direct from 2/1-2/29 and was advised by insurance to contact PCP and have her resubmit PA through [facility] Direct.  Medicaid Direct Member ID # [id number] /  phone # [phone]    Patient current insurance effective 5/1 Nadene Rubins [facility] Cross Member ID # [id number] M / Group # [id number]

## 2023-05-05 NOTE — Telephone Encounter (Signed)
[employer] [employer] was contacted. Call reference number: [id number] with a PA approval outcome for 01/22/2023-02/19/2023. PA number: [id number]. Patient notified.

## 2023-05-09 ENCOUNTER — Encounter: Payer: Self-pay | Admitting: Family Medicine

## 2023-05-14 NOTE — Progress Notes (Signed)
Name: Lindsay Mcdonald   MRN: [id number]    DOB: 08/17/1961   Date:05/15/2023       Progress Note  Subjective  Chief Complaint  Follow Up  HPI  FMS: she has a long history of feeling aching and tender worse on trunk and upper extremities. Pain on shoulders, arms, stiff on her hips .Stiffiness is worse when she first gets up in the mornings, she has some mental fogginess, she has tried Lyrica and Duloxetine but stopped on her own, she has intense pain intermittently and worsening of mental fogginess that affects her work. 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 We will adjust dose of zoloft to 100 mg daily   Insomnia: she has tried Hydroxyzine, Seroquel, Unisom and Ambien. We will try Trazodone   OA: seeing Dr. Landry Mellow and had steroid injections on both knees ,no effusion or redness,pain resolved ,she has a history of bakers cysts, knees are stiff intermittently but doing better, she has also had injections on both shoulder,  also uses topical medications on her knees. Unchanged   DMII:glucose lately has been elevated due to prednisone  A1C went from 6.6 % to 7.9% to 8.1% ,6.7 %, 7 % 6.6 % , 7.1 % ,8.1 % to 7.1 %, 7.4 % 6.7 % 6.5 % and today is 6.7 %. She asked me about switching to [facility] -advised   She has been compliant with Xultophy  and also taking Metformin 1500 mg daily No polyphagia, polydipsia or polyuria. 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 She was having some vaginal paresthesia and neurologist ordered MRI lumbar spine but she did not have it done. She has a follow up visit with neurologist next week and advised her to have MRI done before she goes for follow up. She has not been taking Lyrica as recommended - tired of taking pills.    OSA: she has not been back for  titration yet, she states not having problems lately, used to gasp for air but states symptoms not as bad lately . Unchanged   HTN: she is now on Benicar 40 mg, no chest pain or palpation . BP is towards low end of normal but no dizziness and we will continue current dose    Hyperlipidemia: she has tried multiple statins and zetia but unable to tolerate due to severe muscle aches. She states fish oil is too expensive. Last LDL was 94, discussed associated risk of heart attacks and strokes    Morbid Obesity: BMI is still above goal ,weight is down a couple of pounds. 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 Malvin Johns, B12 was low, she was taking otc SL b12 and we will recheck levels    Patient Active Problem List   Diagnosis Date Noted   B12 deficiency 05/15/2023  Occult blood positive stool 02/03/2023   Adenomatous polyp of colon 02/03/2023   Dyslipidemia associated with type 2 diabetes mellitus (HCC) 05/21/2021   Hypertension associated with diabetes (HCC) 08/31/2020   History of marijuana use 03/30/2019   Fibromyalgia 12/31/2018   Venous vascular malformations 03/27/2016   Seasonal allergic rhinitis 09/26/2015   Muscle spasms of neck 06/19/2015   Insomnia 06/19/2015   Primary osteoarthritis of right knee 06/17/2015   Depression, major, recurrent, mild (HCC) 06/17/2015   Hyperlipidemia 06/17/2015   Benign hypertension 06/17/2015   Morbid obesity with BMI of 40.0-44.9, adult (HCC) 06/17/2015   Vitamin D deficiency  06/17/2015    Past Surgical History:  Procedure Laterality Date   BREAST REDUCTION SURGERY     COLONOSCOPY WITH PROPOFOL N/A 02/03/2023   Procedure: COLONOSCOPY WITH PROPOFOL;  Surgeon: Wyline Mood, MD;  Location: [facility] ENDOSCOPY;  Service: Gastroenterology;  Laterality: N/A;   ETHMOIDECTOMY Right 05/16/2020   Procedure: ETHMOIDECTOMY;  Surgeon: Vernie Murders, MD;  Location: [facility] ORS;  Service: ENT;  Laterality: Right;   EXCISION ORAL TUMOR N/A 05/16/2020   Procedure: EXCISION OF SOFT PALATE PAPILLOMA;  Surgeon: Vernie Murders, MD;  Location: [facility] ORS;  Service: ENT;  Laterality: N/A;   FRONTAL SINUS EXPLORATION Right 05/16/2020   Procedure: FRONTAL SINUS EXPLORATION;  Surgeon: Vernie Murders, MD;  Location: [facility] ORS;  Service: ENT;  Laterality: Right;   IMAGE GUIDED SINUS SURGERY N/A 05/16/2020   Procedure: IMAGE GUIDED SINUS SURGERY;  Surgeon: Vernie Murders, MD;  Location: [facility] ORS;  Service: ENT;  Laterality: N/A;   MAXILLARY ANTROSTOMY Bilateral 05/16/2020   Procedure: MAXILLARY ANTROSTOMY with tissue removal;  Surgeon: Vernie Murders, MD;  Location: [facility] ORS;  Service: ENT;  Laterality: Bilateral;   nasal endoscopic     OOPHORECTOMY  ?   one ovary removed    REDUCTION MAMMAPLASTY Bilateral 2000   removal of ovary     SPHENOIDECTOMY Right 05/16/2020   Procedure: SPHENOIDECTOMY;  Surgeon: Vernie Murders, MD;  Location: [facility] ORS;  Service: ENT;  Laterality: Right;    Family History  Problem Relation Age of Onset   Diabetes Mother    Cancer Mother 50       Breast   Breast cancer Mother 80   Gout Mother    Diabetes Father    Diabetes Daughter        Oldest Daughter   Breast cancer Maternal Aunt 55   Kidney cancer Maternal Aunt    Kidney disease Maternal Aunt    Stroke Maternal Uncle    Cancer Half-Sister 28       breast cancer   Prostate cancer Neg Hx    Bladder Cancer Neg Hx     Social History   Tobacco Use   Smoking status: Never   Smokeless tobacco: Never  Substance Use  Topics   Alcohol use: No    Alcohol/week: 0.0 standard drinks of alcohol     Current Outpatient Medications:    albuterol (VENTOLIN HFA) 108 (90 Base) MCG/ACT inhaler, Inhale 2 puffs into the lungs every 6 (six) hours as needed for wheezing or shortness of breath., Disp: 8 g, Rfl: 2   aspirin EC 81 MG tablet, Take 81 mg by mouth daily. Swallow whole., Disp: , Rfl:    baclofen (LIORESAL) 10 MG tablet, Take 10 mg by mouth at bedtime., Disp: , Rfl:    cromolyn (OPTICROM) 4 % ophthalmic solution, Place 1 drop into both eyes 4 (four) times daily as needed., Disp: 10 mL, Rfl: 2  fluticasone (FLONASE) 50 MCG/ACT nasal spray, Place 2 sprays into both nostrils daily as needed for allergies or rhinitis., Disp: , Rfl:    Melatonin 10 MG TABS, Take 10 mg by mouth at bedtime as needed (sleep). , Disp: , Rfl:    pregabalin (LYRICA) 50 MG capsule, Take 1-3 capsules (50-150 mg total) by mouth at bedtime., Disp: 90 capsule, Rfl: 0   traZODone (DESYREL) 50 MG tablet, Take 0.5-1 tablets (25-50 mg total) by mouth at bedtime as needed for sleep., Disp: 30 tablet, Rfl: 2   Insulin Degludec-Liraglutide (XULTOPHY) 100-3.6 UNIT-MG/ML SOPN, Inject 50 Units into the skin daily., Disp: 45 mL, Rfl: 1   metFORMIN (GLUCOPHAGE-XR) 750 MG 24 hr tablet, Take 2 tablets (1,500 mg total) by mouth daily with breakfast., Disp: 180 tablet, Rfl: 1   olmesartan (BENICAR) 40 MG tablet, Take 1 tablet (40 mg total) by mouth daily. In place of lisinopril for bp, Disp: 90 tablet, Rfl: 1   sertraline (ZOLOFT) 100 MG tablet, Take 1 tablet (100 mg total) by mouth daily., Disp: 90 tablet, Rfl: 0   Vitamin D, Ergocalciferol, (DRISDOL) 1.25 MG (50000 UNIT) CAPS capsule, Take 1 capsule (50,000 Units total) by mouth every 7 (seven) days., Disp: 12 capsule, Rfl: 1  Allergies  Allergen Reactions   Simvastatin     Muscle cramps    Zetia [Ezetimibe] Other (See Comments)    Myalgia    Atorvastatin Other (See Comments)    muscle cramps    Compazine Other (See Comments)    Twitching and spasms of neck muscles   Penicillins Rash    Reaction under 25, patient unsure that she still has an allergy    I personally reviewed active problem list, medication list, allergies, family history, social history, health maintenance with the patient/caregiver today.   ROS  Constitutional: Negative for fever or weight change.  Respiratory: Negative for cough and shortness of breath.   Cardiovascular: Negative for chest pain or palpitations.  Gastrointestinal: Negative for abdominal pain, no bowel changes.  Musculoskeletal: Negative for gait problem or joint swelling.  Skin: Negative for rash.  Neurological: Negative for dizziness or headache.  No other specific complaints in a complete review of systems (except as listed in HPI above).   Objective  Vitals:   05/15/23 1000  BP: 116/72  Pulse: 96  Resp: 16  Temp: 97.6 F (36.4 C)  TempSrc: Oral  SpO2: 100%  Weight: 266 lb 4.8 oz (120.8 kg)  Height: 5\' 4"  (1.626 m)    Body mass index is 45.71 kg/m.  Physical Exam  Constitutional: Patient appears well-developed and well-nourished. Obese  No distress.  HEENT: head atraumatic, normocephalic, pupils equal and reactive to light,, neck supple Cardiovascular: Normal rate, regular rhythm and normal heart sounds.  No murmur heard. No BLE edema. Pulmonary/Chest: Effort normal and breath sounds normal. No respiratory distress. Abdominal: Soft.  There is no tenderness. Psychiatric: Patient has a normal mood and affect. behavior is normal. Judgment and thought content normal.   Recent Results (from the past 2160 hour(s))  POCT HgB A1C     Status: Abnormal   Collection Time: 05/15/23 10:03 AM  Result Value Ref Range   Hemoglobin A1C 6.7 (A) 4.0 - 5.6 %   HbA1c POC (<> result, manual entry)     HbA1c, POC (prediabetic range)     HbA1c, POC (controlled diabetic range)       PHQ2/9:    05/15/2023   10:02 AM 02/27/2023    1:23 PM  01/23/2023    9:38 AM 12/03/2022    7:42 AM 11/10/2022    1:59 PM  Depression screen PHQ 2/9  Decreased Interest 1 0 1 2 2   Down, Depressed, Hopeless 1 0 1 1 1   PHQ - 2 Score 2 0 2 3 3   Altered sleeping 1 0 2 1 0  Tired, decreased energy 2 0 2 1 0  Change in appetite 3 0 3 0   Feeling bad or failure about yourself  2 0 1 0 1  Trouble concentrating 0 0 0 0 0  Moving slowly or fidgety/restless 0 0 0 0 0  Suicidal thoughts 0 0 0 0 0  PHQ-9 Score 10 0 10 5 4   Difficult doing work/chores Somewhat difficult Not difficult at all       phq 9 is positive   Fall Risk:    05/15/2023   10:01 AM 02/27/2023    1:23 PM 01/23/2023    9:38 AM 12/03/2022    7:37 AM 11/10/2022    1:59 PM  Fall Risk   Falls in the past year? 0 0 0 0 0  Number falls in past yr:  0 0 0 0  Injury with Fall?  0 0 0 0  Risk for fall due to : No Fall Risks No Fall Risks No Fall Risks No Fall Risks No Fall Risks  Follow up Falls prevention discussed Falls prevention discussed;Education provided;Falls evaluation completed Falls prevention discussed Falls prevention discussed Falls prevention discussed      Functional Status Survey: Is the patient deaf or have difficulty hearing?: No Does the patient have difficulty seeing, even when wearing glasses/contacts?: Yes Does the patient have difficulty concentrating, remembering, or making decisions?: Yes Does the patient have difficulty walking or climbing stairs?: Yes Does the patient have difficulty dressing or bathing?: No Does the patient have difficulty doing errands alone such as visiting a doctor's office or shopping?: No    Assessment & Plan  1. Dyslipidemia associated with type 2 diabetes mellitus (HCC)  - POCT HgB A1C - Lipid panel - COMPLETE METABOLIC PANEL WITH GFR  2. Hypertension associated with diabetes (HCC)  - olmesartan (BENICAR) 40 MG tablet; Take 1 tablet (40 mg total) by mouth daily. In place of lisinopril for bp  Dispense: 90 tablet; Refill:  1  3. B12 deficiency  - B12 and Folate Panel - CBC with Differential/Platelet  4. Depression, major, recurrent, moderate (HCC)  - sertraline (ZOLOFT) 100 MG tablet; Take 1 tablet (100 mg total) by mouth daily.  Dispense: 90 tablet; Refill: 0 - traZODone (DESYREL) 50 MG tablet; Take 0.5-1 tablets (25-50 mg total) by mouth at bedtime as needed for sleep.  Dispense: 30 tablet; Refill: 2  5. Diabetic peripheral neuropathy associated with type 2 diabetes mellitus (HCC)  - metFORMIN (GLUCOPHAGE-XR) 750 MG 24 hr tablet; Take 2 tablets (1,500 mg total) by mouth daily with breakfast.  Dispense: 180 tablet; Refill: 1 - Insulin Degludec-Liraglutide (XULTOPHY) 100-3.6 UNIT-MG/ML SOPN; Inject 50 Units into the skin daily.  Dispense: 45 mL; Refill: 1  6. Vitamin D deficiency  - Vitamin D, Ergocalciferol, (DRISDOL) 1.25 MG (50000 UNIT) CAPS capsule; Take 1 capsule (50,000 Units total) by mouth every 7 (seven) days.  Dispense: 12 capsule; Refill: 1  7. GAD (generalized anxiety disorder)  - sertraline (ZOLOFT) 100 MG tablet; Take 1 tablet (100 mg total) by mouth daily.  Dispense: 90 tablet; Refill: 0  8. Insomnia due to mental disorder  - traZODone (DESYREL) 50  MG tablet; Take 0.5-1 tablets (25-50 mg total) by mouth at bedtime as needed for sleep.  Dispense: 30 tablet; Refill: 2

## 2023-05-15 ENCOUNTER — Ambulatory Visit (INDEPENDENT_AMBULATORY_CARE_PROVIDER_SITE_OTHER): Payer: BC Managed Care – PPO | Admitting: Family Medicine

## 2023-05-15 ENCOUNTER — Encounter: Payer: Self-pay | Admitting: Family Medicine

## 2023-05-15 VITALS — BP 116/72 | HR 96 | Temp 97.6°F | Resp 16 | Ht 64.0 in | Wt 266.3 lb

## 2023-05-15 DIAGNOSIS — Z7984 Long term (current) use of oral hypoglycemic drugs: Secondary | ICD-10-CM | POA: Diagnosis not present

## 2023-05-15 DIAGNOSIS — F331 Major depressive disorder, recurrent, moderate: Secondary | ICD-10-CM | POA: Diagnosis not present

## 2023-05-15 DIAGNOSIS — E785 Hyperlipidemia, unspecified: Secondary | ICD-10-CM

## 2023-05-15 DIAGNOSIS — E1169 Type 2 diabetes mellitus with other specified complication: Secondary | ICD-10-CM

## 2023-05-15 DIAGNOSIS — F411 Generalized anxiety disorder: Secondary | ICD-10-CM

## 2023-05-15 DIAGNOSIS — E1142 Type 2 diabetes mellitus with diabetic polyneuropathy: Secondary | ICD-10-CM

## 2023-05-15 DIAGNOSIS — E559 Vitamin D deficiency, unspecified: Secondary | ICD-10-CM

## 2023-05-15 DIAGNOSIS — E538 Deficiency of other specified B group vitamins: Secondary | ICD-10-CM | POA: Diagnosis not present

## 2023-05-15 DIAGNOSIS — E1159 Type 2 diabetes mellitus with other circulatory complications: Secondary | ICD-10-CM

## 2023-05-15 DIAGNOSIS — Z794 Long term (current) use of insulin: Secondary | ICD-10-CM

## 2023-05-15 DIAGNOSIS — I152 Hypertension secondary to endocrine disorders: Secondary | ICD-10-CM

## 2023-05-15 DIAGNOSIS — F5105 Insomnia due to other mental disorder: Secondary | ICD-10-CM

## 2023-05-15 LAB — POCT GLYCOSYLATED HEMOGLOBIN (HGB A1C): Hemoglobin A1C: 6.7 % — AB (ref 4.0–5.6)

## 2023-05-15 MED ORDER — OLMESARTAN MEDOXOMIL 40 MG PO TABS: 40.0000 mg | ORAL_TABLET | Freq: Every day | ORAL | 1 refills | Status: DC

## 2023-05-15 MED ORDER — METFORMIN HCL ER 750 MG PO TB24: 1500.0000 mg | ORAL_TABLET | Freq: Every day | ORAL | 1 refills | Status: DC

## 2023-05-15 MED ORDER — VITAMIN D (ERGOCALCIFEROL) 1.25 MG (50000 UNIT) PO CAPS: 50000.0000 [IU] | ORAL_CAPSULE | ORAL | 1 refills | Status: DC

## 2023-05-15 MED ORDER — TRAZODONE HCL 50 MG PO TABS: 25.0000 mg | ORAL_TABLET | Freq: Every evening | ORAL | 2 refills | Status: DC | PRN

## 2023-05-15 MED ORDER — XULTOPHY 100-3.6 UNIT-MG/ML ~~LOC~~ SOPN: 50.0000 [IU] | PEN_INJECTOR | Freq: Every day | SUBCUTANEOUS | 1 refills | Status: DC

## 2023-05-15 MED ORDER — SERTRALINE HCL 100 MG PO TABS: 100.0000 mg | ORAL_TABLET | Freq: Every day | ORAL | 0 refills | Status: DC

## 2023-05-15 NOTE — Addendum Note (Signed)
Addended by: Davene Costain on: 05/15/2023 01:16 PM   Modules accepted: Orders

## 2023-05-15 NOTE — Patient Instructions (Signed)
Check with insurance if they cover Greggory Keen and find out cost of the co-pain for [place].  You will replace Xultophy with the two medications above

## 2023-05-16 LAB — CBC WITH DIFFERENTIAL/PLATELET
Absolute Monocytes: 540 cells/uL (ref 200–950)
Basophils Absolute: 67 cells/uL (ref 0–200)
Basophils Relative: 0.9 %
Eosinophils Absolute: 518 cells/uL — ABNORMAL HIGH (ref 15–500)
Eosinophils Relative: 7 %
HCT: 40.3 % (ref 35.0–45.0)
Hemoglobin: 12.9 g/dL (ref 11.7–15.5)
Lymphs Abs: 1887 cells/uL (ref 850–3900)
MCH: 25.7 pg — ABNORMAL LOW (ref 27.0–33.0)
MCHC: 32 g/dL (ref 32.0–36.0)
MCV: 80.4 fL (ref 80.0–100.0)
MPV: 11.3 fL (ref 7.5–12.5)
Monocytes Relative: 7.3 %
Neutro Abs: 4388 cells/uL (ref 1500–7800)
Neutrophils Relative %: 59.3 %
Platelets: 273 10*3/uL (ref 140–400)
RBC: 5.01 10*6/uL (ref 3.80–5.10)
RDW: 13 % (ref 11.0–15.0)
Total Lymphocyte: 25.5 %
WBC: 7.4 10*3/uL (ref 3.8–10.8)

## 2023-05-16 LAB — COMPLETE METABOLIC PANEL WITH GFR
AG Ratio: 1.4 (calc) (ref 1.0–2.5)
ALT: 11 U/L (ref 6–29)
AST: 18 U/L (ref 10–35)
Albumin: 4 g/dL (ref 3.6–5.1)
Alkaline phosphatase (APISO): 84 U/L (ref 37–153)
BUN: 15 mg/dL (ref 7–25)
CO2: 28 mmol/L (ref 20–32)
Calcium: 9.1 mg/dL (ref 8.6–10.4)
Chloride: 104 mmol/L (ref 98–110)
Creat: 0.84 mg/dL (ref 0.50–1.03)
Globulin: 2.8 g/dL (calc) (ref 1.9–3.7)
Glucose, Bld: 111 mg/dL — ABNORMAL HIGH (ref 65–99)
Potassium: 4.5 mmol/L (ref 3.5–5.3)
Sodium: 141 mmol/L (ref 135–146)
Total Bilirubin: 0.4 mg/dL (ref 0.2–1.2)
Total Protein: 6.8 g/dL (ref 6.1–8.1)
eGFR: 80 mL/min/{1.73_m2} (ref >=60)

## 2023-05-16 LAB — LIPID PANEL
Cholesterol: 211 mg/dL — ABNORMAL HIGH (ref <200)
HDL: 56 mg/dL (ref >=50)
LDL Cholesterol (Calc): 134 mg/dL (calc) — ABNORMAL HIGH
Non-HDL Cholesterol (Calc): 155 mg/dL (calc) — ABNORMAL HIGH (ref <130)
Total CHOL/HDL Ratio: 3.8 (calc) (ref <5.0)
Triglycerides: 107 mg/dL (ref <150)

## 2023-05-16 LAB — B12 AND FOLATE PANEL
Folate: 7.4 ng/mL
Vitamin B-12: 1058 pg/mL (ref 200–1100)

## 2023-05-18 ENCOUNTER — Encounter: Payer: Self-pay | Admitting: Family Medicine

## 2023-05-19 ENCOUNTER — Encounter: Payer: Self-pay | Admitting: Family Medicine

## 2023-05-19 MED ORDER — VITAMIN D (ERGOCALCIFEROL) 1.25 MG (50000 UNIT) PO CAPS
50000.0000 [IU] | ORAL_CAPSULE | ORAL | 1 refills | Status: DC
Start: 2023-05-19 — End: 2023-08-07

## 2023-05-19 MED ORDER — METFORMIN HCL ER 750 MG PO TB24: 1500.0000 mg | ORAL_TABLET | Freq: Every day | ORAL | 1 refills | Status: DC

## 2023-05-19 MED ORDER — OLMESARTAN MEDOXOMIL 40 MG PO TABS: 40.0000 mg | ORAL_TABLET | Freq: Every day | ORAL | 1 refills | Status: DC

## 2023-05-19 MED ORDER — TRAZODONE HCL 50 MG PO TABS
25.0000 mg | ORAL_TABLET | Freq: Every evening | ORAL | 2 refills | Status: DC | PRN
Start: 2023-05-19 — End: 2023-08-07

## 2023-05-19 MED ORDER — SERTRALINE HCL 100 MG PO TABS
100.0000 mg | ORAL_TABLET | Freq: Every day | ORAL | 0 refills | Status: DC
Start: 2023-05-19 — End: 2023-08-07

## 2023-05-19 MED ORDER — XULTOPHY 100-3.6 UNIT-MG/ML ~~LOC~~ SOPN: 50.0000 [IU] | PEN_INJECTOR | Freq: Every day | SUBCUTANEOUS | 1 refills | Status: DC

## 2023-05-19 NOTE — Addendum Note (Signed)
Addended by: Dollene Primrose on: 05/19/2023 08:43 AM   Modules accepted: Orders

## 2023-05-22 ENCOUNTER — Other Ambulatory Visit: Payer: Self-pay | Admitting: Physician Assistant

## 2023-05-22 DIAGNOSIS — R200 Anesthesia of skin: Secondary | ICD-10-CM

## 2023-05-23 ENCOUNTER — Encounter: Payer: Self-pay | Admitting: Family Medicine

## 2023-05-23 DIAGNOSIS — Z419 Encounter for procedure for purposes other than remedying health state, unspecified: Secondary | ICD-10-CM | POA: Diagnosis not present

## 2023-05-25 ENCOUNTER — Encounter: Payer: Self-pay | Admitting: Neurology

## 2023-05-26 ENCOUNTER — Other Ambulatory Visit: Payer: Self-pay | Admitting: Family Medicine

## 2023-05-26 DIAGNOSIS — E1169 Type 2 diabetes mellitus with other specified complication: Secondary | ICD-10-CM

## 2023-05-26 MED ORDER — TRESIBA FLEXTOUCH 100 UNIT/ML ~~LOC~~ SOPN
40.0000 [IU] | PEN_INJECTOR | Freq: Every day | SUBCUTANEOUS | 0 refills | Status: DC
Start: 2023-05-26 — End: 2023-06-23

## 2023-05-26 MED ORDER — TIRZEPATIDE 5 MG/0.5ML ~~LOC~~ SOAJ
5.0000 mg | SUBCUTANEOUS | 0 refills | Status: DC
Start: 2023-05-26 — End: 2023-06-23

## 2023-05-29 ENCOUNTER — Other Ambulatory Visit: Payer: BC Managed Care – PPO

## 2023-05-30 ENCOUNTER — Encounter: Payer: Self-pay | Admitting: Family Medicine

## 2023-06-01 ENCOUNTER — Other Ambulatory Visit: Payer: Self-pay | Admitting: Family Medicine

## 2023-06-01 DIAGNOSIS — E1169 Type 2 diabetes mellitus with other specified complication: Secondary | ICD-10-CM

## 2023-06-01 MED ORDER — INSULIN PEN NEEDLE 32G X 6 MM MISC
1.0000 | Freq: Every day | 1 refills | Status: DC
Start: 2023-06-01 — End: 2024-11-14

## 2023-06-01 MED ORDER — PITAVASTATIN CALCIUM 2 MG PO TABS
2.0000 mg | ORAL_TABLET | Freq: Every day | ORAL | 2 refills | Status: DC
Start: 2023-06-01 — End: 2023-08-07

## 2023-06-02 ENCOUNTER — Other Ambulatory Visit: Payer: Self-pay | Admitting: Physician Assistant

## 2023-06-02 DIAGNOSIS — R200 Anesthesia of skin: Secondary | ICD-10-CM

## 2023-06-04 ENCOUNTER — Encounter: Payer: Self-pay | Admitting: Family Medicine

## 2023-06-11 ENCOUNTER — Encounter: Payer: Self-pay | Admitting: Family Medicine

## 2023-06-18 ENCOUNTER — Encounter: Payer: Self-pay | Admitting: Neurology

## 2023-06-22 ENCOUNTER — Ambulatory Visit
Admission: RE | Admit: 2023-06-22 | Discharge: 2023-06-22 | Disposition: A | Discharge status: Home / Self Care | Payer: BC Managed Care – PPO | Source: Ambulatory Visit | Attending: Physician Assistant | Admitting: Physician Assistant

## 2023-06-22 DIAGNOSIS — Z419 Encounter for procedure for purposes other than remedying health state, unspecified: Secondary | ICD-10-CM | POA: Diagnosis not present

## 2023-06-22 DIAGNOSIS — R200 Anesthesia of skin: Secondary | ICD-10-CM

## 2023-06-22 MED ORDER — GADOPICLENOL 0.5 MMOL/ML IV SOLN: 10.0000 mL | Freq: Once | INTRAVENOUS | Status: DC | PRN

## 2023-06-23 ENCOUNTER — Other Ambulatory Visit: Payer: Self-pay

## 2023-06-23 DIAGNOSIS — E1169 Type 2 diabetes mellitus with other specified complication: Secondary | ICD-10-CM

## 2023-06-23 MED ORDER — TIRZEPATIDE 5 MG/0.5ML ~~LOC~~ SOAJ: 5.0000 mg | SUBCUTANEOUS | 0 refills | Status: DC

## 2023-06-23 MED ORDER — TRESIBA FLEXTOUCH 100 UNIT/ML ~~LOC~~ SOPN
40.0000 [IU] | PEN_INJECTOR | Freq: Every day | SUBCUTANEOUS | 0 refills | Status: DC
Start: 2023-06-23 — End: 2023-08-11

## 2023-06-25 LAB — HM DIABETES EYE EXAM

## 2023-06-26 ENCOUNTER — Telehealth: Payer: Self-pay | Admitting: Family Medicine

## 2023-06-26 ENCOUNTER — Encounter: Payer: Self-pay | Admitting: Family Medicine

## 2023-06-26 NOTE — Telephone Encounter (Signed)
Pt is calling in because she is currently on tirzepatide [facility]) 5 MG/0.5ML Pen [[id number]] but says both her current and previous pharmacy have the medication on Backorder. Pt says [facility] on [address] has the medication in 2.5 MG and 7 MG and pt wants to know can she get another prescription in either the 2.5 or the 7 MG so she can continue to take the medicine. PT is requesting a callback regarding this matter

## 2023-06-29 ENCOUNTER — Other Ambulatory Visit: Payer: Self-pay | Admitting: Family Medicine

## 2023-06-29 DIAGNOSIS — E1169 Type 2 diabetes mellitus with other specified complication: Secondary | ICD-10-CM

## 2023-06-29 MED ORDER — TIRZEPATIDE 5 MG/0.5ML ~~LOC~~ SOAJ: 5.0000 mg | SUBCUTANEOUS | 0 refills | Status: DC

## 2023-06-29 NOTE — Telephone Encounter (Signed)
Called and lvm informing patient to check with the [facility] pharmacy and explained dose changes are hard due to insurance approvals.

## 2023-07-07 ENCOUNTER — Encounter: Payer: Self-pay | Admitting: Family Medicine

## 2023-07-13 ENCOUNTER — Inpatient Hospital Stay: Admission: RE | Admit: 2023-07-13 | Payer: BC Managed Care – PPO | Source: Ambulatory Visit

## 2023-07-16 ENCOUNTER — Encounter: Payer: Self-pay | Admitting: Family Medicine

## 2023-07-20 NOTE — Progress Notes (Deleted)
Name: Lindsay Mcdonald   MRN: [id number]    DOB: 1961/10/15   Date:07/20/2023       Progress Note  Subjective  Chief Complaint  Follow Up  HPI  FMS: she has a long history of feeling aching and tender worse on trunk and upper extremities. Pain on shoulders, arms, stiff on her hips .Stiffiness is worse when she first gets up in the mornings, she has some mental fogginess, she has tried Lyrica and Duloxetine but stopped on her own, she has intense pain intermittently and worsening of mental fogginess that affects her work. 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 We will adjust dose of zoloft to 100 mg daily   Insomnia: she has tried Hydroxyzine, Seroquel, Unisom and Ambien. We will try Trazodone   OA: seeing Dr. Landry Mellow and had steroid injections on both knees ,no effusion or redness,pain resolved ,she has a history of bakers cysts, knees are stiff intermittently but doing better, she has also had injections on both shoulder,  also uses topical medications on her knees. Unchanged   DMII:glucose lately has been elevated due to prednisone  A1C went from 6.6 % to 7.9% to 8.1% ,6.7 %, 7 % 6.6 % , 7.1 % ,8.1 % to 7.1 %, 7.4 % 6.7 % 6.5 % and today is 6.7 %. She asked me about switching to [facility] -advised   She has been compliant with Xultophy  and also taking Metformin 1500 mg daily No polyphagia, polydipsia or polyuria. 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 She was having some vaginal paresthesia and neurologist ordered MRI lumbar spine but she did not have it done. She has a follow up visit with neurologist next week and advised her to have MRI done before she goes for follow up. She has not been taking Lyrica as recommended - tired of taking pills.    OSA: she has not been back for  titration yet, she states not having problems lately, used to gasp for air but states symptoms not as bad lately . Unchanged   HTN: she is now on Benicar 40 mg, no chest pain or palpation . BP is towards low end of normal but no dizziness and we will continue current dose    Hyperlipidemia: she has tried multiple statins and zetia but unable to tolerate due to severe muscle aches. She states fish oil is too expensive. Last LDL was 94, discussed associated risk of heart attacks and strokes    Morbid Obesity: BMI is still above goal ,weight is down a couple of pounds. 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 Malvin Johns, B12 was low, she was taking otc SL b12 and we will recheck levels    Patient Active Problem List   Diagnosis Date Noted   B12 deficiency 05/15/2023  Occult blood positive stool 02/03/2023   Adenomatous polyp of colon 02/03/2023   Dyslipidemia associated with type 2 diabetes mellitus (HCC) 05/21/2021   Hypertension associated with diabetes (HCC) 08/31/2020   History of marijuana use 03/30/2019   Fibromyalgia 12/31/2018   Venous vascular malformations 03/27/2016   Seasonal allergic rhinitis 09/26/2015   Muscle spasms of neck 06/19/2015   Insomnia 06/19/2015   Primary osteoarthritis of right knee 06/17/2015   Depression, major, recurrent, mild (HCC) 06/17/2015   Hyperlipidemia 06/17/2015   Benign hypertension 06/17/2015   Morbid obesity with BMI of 40.0-44.9, adult (HCC) 06/17/2015   Vitamin D deficiency  06/17/2015    Past Surgical History:  Procedure Laterality Date   BREAST REDUCTION SURGERY     COLONOSCOPY WITH PROPOFOL N/A 02/03/2023   Procedure: COLONOSCOPY WITH PROPOFOL;  Surgeon: Wyline Mood, MD;  [facility] ENDOSCOPY;  Service: Gastroenterology;  Laterality: N/A;   ETHMOIDECTOMY Right 05/16/2020   Procedure: ETHMOIDECTOMY;  Surgeon: Vernie Murders, MD;  Location: [facility] ORS;  Service: ENT;  Laterality: Right;   EXCISION ORAL TUMOR N/A 05/16/2020   Procedure: EXCISION OF SOFT PALATE PAPILLOMA;  Surgeon: Vernie Murders, MD;  Location: [facility] ORS;  Service: ENT;  Laterality: N/A;   FRONTAL SINUS EXPLORATION Right 05/16/2020   Procedure: FRONTAL SINUS EXPLORATION;  Surgeon: Vernie Murders, MD;  Location: [facility] ORS;  Service: ENT;  Laterality: Right;   IMAGE GUIDED SINUS SURGERY N/A 05/16/2020   Procedure: IMAGE GUIDED SINUS SURGERY;  Surgeon: Vernie Murders, MD;  Location: [facility] ORS;  Service: ENT;  Laterality: N/A;   MAXILLARY ANTROSTOMY Bilateral 05/16/2020   Procedure: MAXILLARY ANTROSTOMY with tissue removal;  Surgeon: Vernie Murders, MD;  Location: [facility] ORS;  Service: ENT;  Laterality: Bilateral;   nasal endoscopic     OOPHORECTOMY  ?   one ovary removed    REDUCTION MAMMAPLASTY Bilateral 2000   removal of ovary     SPHENOIDECTOMY Right 05/16/2020   Procedure: SPHENOIDECTOMY;  Surgeon: Vernie Murders, MD;  Location: [facility] ORS;  Service: ENT;  Laterality: Right;    Family History  Problem Relation Age of Onset   Diabetes Mother    Cancer Mother 75       Breast   Breast cancer Mother 64   Gout Mother    Diabetes Father    Diabetes Daughter        Oldest Daughter   Breast cancer Maternal Aunt 55   Kidney cancer Maternal Aunt    Kidney disease Maternal Aunt    Stroke Maternal Uncle    Cancer Half-Sister 5       breast cancer   Prostate cancer Neg Hx    Bladder Cancer Neg Hx     Social History   Tobacco Use   Smoking status: Never   Smokeless tobacco: Never  Substance Use  Topics   Alcohol use: No    Alcohol/week: 0.0 standard drinks of alcohol     Current Outpatient Medications:    Insulin Pen Needle 32G X 6 MM MISC, 1 each by Does not apply route daily at 2 PM., Disp: 100 each, Rfl: 1   albuterol (VENTOLIN HFA) 108 (90 Base) MCG/ACT inhaler, Inhale 2 puffs into the lungs every 6 (six) hours as needed for wheezing or shortness of breath., Disp: 8 g, Rfl: 2   aspirin EC 81 MG tablet, Take 81 mg by mouth daily. Swallow whole., Disp: , Rfl:    baclofen (LIORESAL) 10 MG tablet, Take 10 mg by mouth at bedtime., Disp: , Rfl:  cromolyn (OPTICROM) 4 % ophthalmic solution, Place 1 drop into both eyes 4 (four) times daily as needed., Disp: 10 mL, Rfl: 2   fluticasone (FLONASE) 50 MCG/ACT nasal spray, Place 2 sprays into both nostrils daily as needed for allergies or rhinitis., Disp: , Rfl:    insulin degludec (TRESIBA FLEXTOUCH) 100 UNIT/ML FlexTouch Pen, Inject 40-50 Units into the skin daily., Disp: 15 mL, Rfl: 0   Melatonin 10 MG TABS, Take 10 mg by mouth at bedtime as needed (sleep). , Disp: , Rfl:    metFORMIN (GLUCOPHAGE-XR) 750 MG 24 hr tablet, Take 2 tablets (1,500 mg total) by mouth daily with breakfast., Disp: 180 tablet, Rfl: 1   olmesartan (BENICAR) 40 MG tablet, Take 1 tablet (40 mg total) by mouth daily. In place of lisinopril for bp, Disp: 90 tablet, Rfl: 1   Pitavastatin Calcium 2 MG TABS, Take 1 tablet (2 mg total) by mouth daily., Disp: 30 tablet, Rfl: 2   pregabalin (LYRICA) 50 MG capsule, Take 1-3 capsules (50-150 mg total) by mouth at bedtime., Disp: 90 capsule, Rfl: 0   sertraline (ZOLOFT) 100 MG tablet, Take 1 tablet (100 mg total) by mouth daily., Disp: 90 tablet, Rfl: 0   tirzepatide (MOUNJARO) 5 MG/0.5ML Pen, Inject 5 mg into the skin once a week., Disp: 2 mL, Rfl: 0   traZODone (DESYREL) 50 MG tablet, Take 0.5-1 tablets (25-50 mg total) by mouth at bedtime as needed for sleep., Disp: 30 tablet, Rfl: 2   Vitamin D, Ergocalciferol, (DRISDOL)  1.25 MG (50000 UNIT) CAPS capsule, Take 1 capsule (50,000 Units total) by mouth every 7 (seven) days., Disp: 12 capsule, Rfl: 1  Allergies  Allergen Reactions   Simvastatin     Muscle cramps    Zetia [Ezetimibe] Other (See Comments)    Myalgia    Atorvastatin Other (See Comments)    muscle cramps   Compazine Other (See Comments)    Twitching and spasms of neck muscles   Penicillins Rash    Reaction under 25, patient unsure that she still has an allergy    I personally reviewed active problem list, medication list, allergies, family history, social history, health maintenance with the patient/caregiver today.   ROS  ***  Objective  There were no vitals filed for this visit.  There is no height or weight on file to calculate BMI.  Physical Exam ***   PHQ2/9:    05/15/2023   10:02 AM 02/27/2023    1:23 PM 01/23/2023    9:38 AM 12/03/2022    7:42 AM 11/10/2022    1:59 PM  Depression screen PHQ 2/9  Decreased Interest 1 0 1 2 2   Down, Depressed, Hopeless 1 0 1 1 1   PHQ - 2 Score 2 0 2 3 3   Altered sleeping 1 0 2 1 0  Tired, decreased energy 2 0 2 1 0  Change in appetite 3 0 3 0   Feeling bad or failure about yourself  2 0 1 0 1  Trouble concentrating 0 0 0 0 0  Moving slowly or fidgety/restless 0 0 0 0 0  Suicidal thoughts 0 0 0 0 0  PHQ-9 Score 10 0 10 5 4   Difficult doing work/chores Somewhat difficult Not difficult at all       phq 9 is {gen pos [id number]}   Fall Risk:    05/15/2023   10:01 AM 02/27/2023    1:23 PM 01/23/2023    9:38 AM 12/03/2022    7:37  AM 11/10/2022    1:59 PM  Fall Risk   Falls in the past year? 0 0 0 0 0  Number falls in past yr:  0 0 0 0  Injury with Fall?  0 0 0 0  Risk for fall due to : No Fall Risks No Fall Risks No Fall Risks No Fall Risks No Fall Risks  Follow up Falls prevention discussed Falls prevention discussed;Education provided;Falls evaluation completed Falls prevention discussed Falls prevention discussed Falls  prevention discussed      Functional Status Survey:      Assessment & Plan  *** There are no diagnoses linked to this encounter.

## 2023-07-21 ENCOUNTER — Ambulatory Visit: Payer: BC Managed Care – PPO | Admitting: Family Medicine

## 2023-07-22 ENCOUNTER — Other Ambulatory Visit: Payer: Self-pay | Admitting: Internal Medicine

## 2023-07-22 DIAGNOSIS — E1169 Type 2 diabetes mellitus with other specified complication: Secondary | ICD-10-CM

## 2023-07-23 ENCOUNTER — Encounter: Payer: Self-pay | Admitting: Family Medicine

## 2023-07-23 DIAGNOSIS — Z419 Encounter for procedure for purposes other than remedying health state, unspecified: Secondary | ICD-10-CM | POA: Diagnosis not present

## 2023-07-23 NOTE — Telephone Encounter (Signed)
Requested medication (s) are due for refill today:   Requested medication (s) are on the active medication list: Yes  Last refill:  06/29/23  Future visit scheduled:   Notes to clinic:  Manual review.    Requested Prescriptions  Pending Prescriptions Disp Refills   MOUNJARO 5 MG/0.5ML Pen [Pharmacy Med Name: Mounjaro 5 MG/0.5ML Subcutaneous Solution Pen-injector] 4 mL 0    Sig: INJECT 5MG  SUBCUTANEOUSLY  ONCE A WEEK     Off-Protocol Failed - 07/22/2023  6:29 PM      Failed - Medication not assigned to a protocol, review manually.      Passed - Valid encounter within last 12 months    Recent Outpatient Visits           2 months ago Dyslipidemia associated with type 2 diabetes mellitus [facility])   [place] [facility] [place], Danna Hefty, MD   4 months ago Conjunctivitis of both eyes, unspecified conjunctivitis type   [facility] Danelle Berry, PA-C   6 months ago Dyslipidemia associated with type 2 diabetes mellitus [facility])   [place] [facility] Alba Cory, MD   7 months ago GAD (generalized anxiety disorder)   [place] [facility] Alba Cory, MD   8 months ago Dyslipidemia associated with type 2 diabetes mellitus [facility])   [place] [facility] Alba Cory, MD       Future Appointments             In 2 weeks Alba Cory, MD [facility]

## 2023-07-24 ENCOUNTER — Other Ambulatory Visit: Payer: Self-pay

## 2023-07-24 DIAGNOSIS — E1169 Type 2 diabetes mellitus with other specified complication: Secondary | ICD-10-CM

## 2023-07-27 ENCOUNTER — Telehealth: Payer: Self-pay

## 2023-07-27 NOTE — Telephone Encounter (Signed)
Left voicemail to see if patient has enough Mounjaro to last until her appt August 16th.

## 2023-07-27 NOTE — Telephone Encounter (Signed)
Patient called back and stated she only has one pen left of the Mounjaro. Patient stated she will take this today, 07/27/23.  Patients callback #[phone]

## 2023-07-28 ENCOUNTER — Other Ambulatory Visit: Payer: BC Managed Care – PPO

## 2023-07-29 ENCOUNTER — Encounter: Payer: Self-pay | Admitting: Family Medicine

## 2023-08-06 NOTE — Progress Notes (Signed)
Name: Lindsay Mcdonald   MRN: [id number]    DOB: 1961-08-15   Date:08/07/2023       Progress Note  Subjective  Chief Complaint  Follow Up  HPI  FMS: she has a long history of feeling aching and tender worse on trunk and upper extremities. Pain on shoulders, arms, stiff on her hips .Stiffiness is worse when she first gets up in the mornings, she has some mental fogginess, she has tried Lyrica and Duloxetine but stopped on her own. 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 Sleeping better on Nortriptyline   OA: seeing Dr. Landry Mellow and had steroid injections on both knees ,no effusion or redness, pain resolved, she has a history of bakers cysts, knees are stiff intermittently but doing better, she has also had injections on both shoulder,  also uses topical medications on her knees.  Stable  DMII: . She has been taking Mounjaro since Summer 2024 ,she is still taking Metformin and also using [place], she has noticed shakes in the mornings but has not been checking her glucose. Explained we need to know fasting levels to titrate dose of Tresiba, A1C is down to 6 %. Increase Mounjaro to 7.5 mg , decrease tresiba to 35 units and monitor glucose so we can gradually go down and eventually try to stop [place]. Continue Meformins. No polyphagia, polydipsia or polyuria. She has neuropathy on both legs, described as pins and needles and is taking Nortriptyline    OSA: she has not been back for titration yet, she states not having problems lately, used to gasp for air but states symptoms not as bad lately . Unchanged   HTN: she is now on Benicar 40 mg, no  chest pain or palpation . BP is ast goal, continue medication   Hyperlipidemia: she has tried multiple statins and zetia but unable to tolerate due to severe muscle aches. She states fish oil is too expensive. We gave her Livalo but also unable to take it, caused muscle pain. Currently not taking any medication for cholesterol    Morbid Obesity: BMI is still above goal ,weight is down 7 lbs. 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 She will follow up with neurologist    Patient Active Problem List   Diagnosis Date Noted   B12 deficiency 05/15/2023   Occult blood positive stool 02/03/2023   Adenomatous polyp of colon 02/03/2023   Dyslipidemia associated with type 2 diabetes mellitus (HCC) 05/21/2021   Hypertension associated with diabetes (HCC) 08/31/2020   History of marijuana use 03/30/2019   Fibromyalgia 12/31/2018   Venous vascular malformations 03/27/2016   Seasonal allergic rhinitis 09/26/2015   Muscle spasms of neck 06/19/2015  Insomnia 06/19/2015   Primary osteoarthritis of right knee 06/17/2015   Depression, major, recurrent, mild (HCC) 06/17/2015   Hyperlipidemia 06/17/2015   Benign hypertension 06/17/2015   Morbid obesity with BMI of 40.0-44.9, adult (HCC) 06/17/2015   Vitamin D deficiency 06/17/2015    Past Surgical History:  Procedure Laterality Date   BREAST REDUCTION SURGERY     COLONOSCOPY WITH PROPOFOL N/A 02/03/2023   Procedure: COLONOSCOPY WITH PROPOFOL;  Surgeon: Wyline Mood, MD;  Location: [facility] ENDOSCOPY;  Service: Gastroenterology;  Laterality: N/A;    ETHMOIDECTOMY Right 05/16/2020   Procedure: ETHMOIDECTOMY;  Surgeon: Vernie Murders, MD;  Location: [facility] ORS;  Service: ENT;  Laterality: Right;   EXCISION ORAL TUMOR N/A 05/16/2020   Procedure: EXCISION OF SOFT PALATE PAPILLOMA;  Surgeon: Vernie Murders, MD;  Location: [facility] ORS;  Service: ENT;  Laterality: N/A;   FRONTAL SINUS EXPLORATION Right 05/16/2020   Procedure: FRONTAL SINUS EXPLORATION;  Surgeon: Vernie Murders, MD;  Location: [facility] ORS;  Service: ENT;  Laterality: Right;   IMAGE GUIDED SINUS SURGERY N/A 05/16/2020   Procedure: IMAGE GUIDED SINUS SURGERY;  Surgeon: Vernie Murders, MD;  Location: [facility] ORS;  Service: ENT;  Laterality: N/A;   MAXILLARY ANTROSTOMY Bilateral 05/16/2020   Procedure: MAXILLARY ANTROSTOMY with tissue removal;  Surgeon: Vernie Murders, MD;  Location: [facility] ORS;  Service: ENT;  Laterality: Bilateral;   nasal endoscopic     OOPHORECTOMY  ?   one ovary removed    REDUCTION MAMMAPLASTY Bilateral 2000   removal of ovary     SPHENOIDECTOMY Right 05/16/2020   Procedure: SPHENOIDECTOMY;  Surgeon: Vernie Murders, MD;  Location: [facility] ORS;  Service: ENT;  Laterality: Right;    Family History  Problem Relation Age of Onset   Diabetes Mother    Cancer Mother 71       Breast   Breast cancer Mother 15   Gout Mother    Diabetes Father    Diabetes Daughter        Oldest Daughter   Breast cancer Maternal Aunt 55   Kidney cancer Maternal Aunt    Kidney disease Maternal Aunt    Stroke Maternal Uncle    Cancer Half-Sister 35       breast cancer   Prostate cancer Neg Hx    Bladder Cancer Neg Hx     Social History   Tobacco Use   Smoking status: Never   Smokeless tobacco: Never  Substance Use Topics   Alcohol use: No    Alcohol/week: 0.0 standard drinks of alcohol     Current Outpatient Medications:    aspirin EC 81 MG tablet, Take 81 mg by mouth daily. Swallow whole., Disp: , Rfl:    baclofen (LIORESAL) 10 MG tablet, Take 10 mg by mouth at bedtime., Disp: , Rfl:     cromolyn (OPTICROM) 4 % ophthalmic solution, Place 1 drop into both eyes 4 (four) times daily as needed., Disp: 10 mL, Rfl: 2   insulin degludec (TRESIBA FLEXTOUCH) 100 UNIT/ML FlexTouch Pen, Inject 40-50 Units into the skin daily., Disp: 15 mL, Rfl: 0   Insulin Pen Needle 32G X 6 MM MISC, 1 each by Does not apply route daily at 2 PM., Disp: 100 each, Rfl: 1   Melatonin 10 MG TABS, Take 10 mg by mouth at bedtime as needed (sleep). , Disp: , Rfl:    metFORMIN (GLUCOPHAGE-XR) 750 MG 24 hr tablet, Take 2 tablets (1,500 mg total) by mouth daily with breakfast., Disp: 180 tablet, Rfl: 1  nortriptyline (PAMELOR) 10 MG capsule, Take 20 mg by mouth at bedtime., Disp: , Rfl:    olmesartan (BENICAR) 40 MG tablet, Take 1 tablet (40 mg total) by mouth daily. In place of lisinopril for bp, Disp: 90 tablet, Rfl: 1   sertraline (ZOLOFT) 100 MG tablet, Take 1 tablet (100 mg total) by mouth daily., Disp: 90 tablet, Rfl: 0   tirzepatide (MOUNJARO) 5 MG/0.5ML Pen, Inject 5 mg into the skin once a week., Disp: 2 mL, Rfl: 0   Vitamin D, Ergocalciferol, (DRISDOL) 1.25 MG (50000 UNIT) CAPS capsule, Take 1 capsule (50,000 Units total) by mouth every 7 (seven) days., Disp: 12 capsule, Rfl: 1   albuterol (VENTOLIN HFA) 108 (90 Base) MCG/ACT inhaler, Inhale 2 puffs into the lungs every 6 (six) hours as needed for wheezing or shortness of breath. (Patient not taking: Reported on 08/07/2023), Disp: 8 g, Rfl: 2   fluticasone (FLONASE) 50 MCG/ACT nasal spray, Place 2 sprays into both nostrils daily as needed for allergies or rhinitis. (Patient not taking: Reported on 08/07/2023), Disp: , Rfl:    Pitavastatin Calcium 2 MG TABS, Take 1 tablet (2 mg total) by mouth daily. (Patient not taking: Reported on 08/07/2023), Disp: 30 tablet, Rfl: 2   traZODone (DESYREL) 50 MG tablet, Take 0.5-1 tablets (25-50 mg total) by mouth at bedtime as needed for sleep. (Patient not taking: Reported on 08/07/2023), Disp: 30 tablet, Rfl: 2  Allergies   Allergen Reactions   Simvastatin     Muscle cramps    Zetia [Ezetimibe] Other (See Comments)    Myalgia    Atorvastatin Other (See Comments)    muscle cramps   Compazine Other (See Comments)    Twitching and spasms of neck muscles   Penicillins Rash    Reaction under 25, patient unsure that she still has an allergy    I personally reviewed active problem list, medication list, allergies, family history, social history, health maintenance with the patient/caregiver today.   ROS  Constitutional: Negative for fever, positive for  weight change.  Respiratory: Negative for cough and shortness of breath.   Cardiovascular: Negative for chest pain or palpitations.  Gastrointestinal: Negative for abdominal pain, no bowel changes.  Musculoskeletal: Negative for gait problem or joint swelling.  Skin: Negative for rash.  Neurological: Negative for dizziness or headache.  No other specific complaints in a complete review of systems (except as listed in HPI above).   Objective  Vitals:   08/07/23 1404  BP: 120/70  Pulse: 90  Resp: 16  SpO2: 97%  Weight: 259 lb (117.5 kg)  Height: 5\' 4"  (1.626 m)    Body mass index is 44.46 kg/m.  Physical Exam  Constitutional: Patient appears well-developed and well-nourished. Obese  No distress.  HEENT: head atraumatic, normocephalic, pupils equal and reactive to light, neck supple Cardiovascular: Normal rate, regular rhythm and normal heart sounds.  No murmur heard. No BLE edema. Pulmonary/Chest: Effort normal and breath sounds normal. No respiratory distress. Abdominal: Soft.  There is no tenderness. Psychiatric: Patient has a normal mood and affect. behavior is normal. Judgment and thought content normal.    PHQ2/9:    08/07/2023    2:02 PM 05/15/2023   10:02 AM 02/27/2023    1:23 PM 01/23/2023    9:38 AM 12/03/2022    7:42 AM  Depression screen PHQ 2/9  Decreased Interest 2 1 0 1 2  Down, Depressed, Hopeless 1 1 0 1 1  PHQ - 2 Score  3 2 0  2 3  Altered sleeping 1 1 0 2 1  Tired, decreased energy 1 2 0 2 1  Change in appetite 0 3 0 3 0  Feeling bad or failure about yourself  1 2 0 1 0  Trouble concentrating 0 0 0 0 0  Moving slowly or fidgety/restless 0 0 0 0 0  Suicidal thoughts 0 0 0 0 0  PHQ-9 Score 6 10 0 10 5  Difficult doing work/chores  Somewhat difficult Not difficult at all      phq 9 is positive   Fall Risk:    08/07/2023    2:02 PM 05/15/2023   10:01 AM 02/27/2023    1:23 PM 01/23/2023    9:38 AM 12/03/2022    7:37 AM  Fall Risk   Falls in the past year? 0 0 0 0 0  Number falls in past yr: 0  0 0 0  Injury with Fall? 0  0 0 0  Risk for fall due to : No Fall Risks No Fall Risks No Fall Risks No Fall Risks No Fall Risks  Follow up Falls prevention discussed Falls prevention discussed Falls prevention discussed;Education provided;Falls evaluation completed Falls prevention discussed Falls prevention discussed      Functional Status Survey: Is the patient deaf or have difficulty hearing?: No Does the patient have difficulty seeing, even when wearing glasses/contacts?: Yes Does the patient have difficulty concentrating, remembering, or making decisions?: Yes Does the patient have difficulty walking or climbing stairs?: Yes Does the patient have difficulty dressing or bathing?: No Does the patient have difficulty doing errands alone such as visiting a doctor's office or shopping?: No    Assessment & Plan  1. Dyslipidemia associated with type 2 diabetes mellitus (HCC)  - POCT HgB A1C - Blood Glucose Monitoring Suppl DEVI; 1 each by Does not apply route in the morning, at noon, and at bedtime. May substitute to any manufacturer covered by patient's insurance.  Dispense: 1 each; Refill: 0 - Glucose Blood (BLOOD GLUCOSE TEST STRIPS) STRP; 1 each by In Vitro route in the morning, at noon, and at bedtime. May substitute to any manufacturer covered by patient's insurance.  Dispense: 100 strip; Refill: 0 -  Lancet Device MISC; 1 each by Does not apply route in the morning and at bedtime. May substitute to any manufacturer covered by patient's insurance.  Dispense: 1 each; Refill: 0 - Lancets Misc. MISC; 1 each by Does not apply route 2 (two) times daily as needed. May substitute to any manufacturer covered by patient's insurance.  Dispense: 100 each; Refill: 0  2. Depression, major, recurrent, mild (HCC)  - sertraline (ZOLOFT) 100 MG tablet; Take 1.5 tablets (150 mg total) by mouth daily.  Dispense: 135 tablet; Refill: 1  3. Hypertension associated with diabetes (HCC)  Continue Benicar  4. Diabetic peripheral neuropathy associated with type 2 diabetes mellitus (HCC)  - nortriptyline (PAMELOR) 10 MG capsule; Take 20 mg by mouth at bedtime. Given by neurologist   5. B12 deficiency  On B12 supplementation, last level normal   6. Vitamin D deficiency  Continue supplementation   7. Morbid obesity with BMI of 40.0-44.9, adult [facility])  Discussed with the patient the risk posed by an increased BMI. Discussed importance of portion control, calorie counting and at least 150 minutes of physical activity weekly. Avoid sweet beverages and drink more water. Eat at least 6 servings of fruit and vegetables daily    8. GAD (generalized anxiety disorder)  - sertraline (ZOLOFT)  100 MG tablet; Take 1.5 tablets (150 mg total) by mouth daily.  Dispense: 135 tablet; Refill: 1  9. Primary insomnia  Continue nortriptyline   10. Fibromyalgia  On Nortriptyline  from neurologist for neuropathy   11. Statin myopathy

## 2023-08-07 ENCOUNTER — Ambulatory Visit (INDEPENDENT_AMBULATORY_CARE_PROVIDER_SITE_OTHER): Payer: BC Managed Care – PPO | Admitting: Family Medicine

## 2023-08-07 ENCOUNTER — Ambulatory Visit: Admission: RE | Admit: 2023-08-07 | Payer: BC Managed Care – PPO | Source: Ambulatory Visit

## 2023-08-07 ENCOUNTER — Encounter: Payer: Self-pay | Admitting: Family Medicine

## 2023-08-07 VITALS — BP 120/70 | HR 90 | Resp 16 | Ht 64.0 in | Wt 259.0 lb

## 2023-08-07 DIAGNOSIS — G720 Drug-induced myopathy: Secondary | ICD-10-CM

## 2023-08-07 DIAGNOSIS — E1142 Type 2 diabetes mellitus with diabetic polyneuropathy: Secondary | ICD-10-CM

## 2023-08-07 DIAGNOSIS — Z7984 Long term (current) use of oral hypoglycemic drugs: Secondary | ICD-10-CM

## 2023-08-07 DIAGNOSIS — I152 Hypertension secondary to endocrine disorders: Secondary | ICD-10-CM

## 2023-08-07 DIAGNOSIS — E1159 Type 2 diabetes mellitus with other circulatory complications: Secondary | ICD-10-CM

## 2023-08-07 DIAGNOSIS — E785 Hyperlipidemia, unspecified: Secondary | ICD-10-CM

## 2023-08-07 DIAGNOSIS — Z6841 Body Mass Index (BMI) 40.0 and over, adult: Secondary | ICD-10-CM

## 2023-08-07 DIAGNOSIS — F5101 Primary insomnia: Secondary | ICD-10-CM

## 2023-08-07 DIAGNOSIS — E538 Deficiency of other specified B group vitamins: Secondary | ICD-10-CM

## 2023-08-07 DIAGNOSIS — M797 Fibromyalgia: Secondary | ICD-10-CM

## 2023-08-07 DIAGNOSIS — M5126 Other intervertebral disc displacement, lumbar region: Secondary | ICD-10-CM | POA: Insufficient documentation

## 2023-08-07 DIAGNOSIS — E559 Vitamin D deficiency, unspecified: Secondary | ICD-10-CM

## 2023-08-07 DIAGNOSIS — F330 Major depressive disorder, recurrent, mild: Secondary | ICD-10-CM | POA: Diagnosis not present

## 2023-08-07 DIAGNOSIS — F411 Generalized anxiety disorder: Secondary | ICD-10-CM

## 2023-08-07 DIAGNOSIS — E1169 Type 2 diabetes mellitus with other specified complication: Secondary | ICD-10-CM | POA: Diagnosis not present

## 2023-08-07 LAB — POCT GLYCOSYLATED HEMOGLOBIN (HGB A1C): Hemoglobin A1C: 6 % — AB (ref 4.0–5.6)

## 2023-08-07 MED ORDER — LANCET DEVICE MISC
1.0000 | Freq: Two times a day (BID) | 0 refills | Status: AC
Start: 2023-08-07 — End: 2023-11-15

## 2023-08-07 MED ORDER — BLOOD GLUCOSE TEST VI STRP
1.0000 | ORAL_STRIP | Freq: Three times a day (TID) | 0 refills | Status: DC
Start: 2023-08-07 — End: 2023-12-18

## 2023-08-07 MED ORDER — LANCETS MISC. MISC
1.0000 | Freq: Two times a day (BID) | 0 refills | Status: DC | PRN
Start: 2023-08-07 — End: 2023-11-11

## 2023-08-07 MED ORDER — SERTRALINE HCL 100 MG PO TABS
150.0000 mg | ORAL_TABLET | Freq: Every day | ORAL | 1 refills | Status: DC
Start: 2023-08-07 — End: 2023-11-11

## 2023-08-07 MED ORDER — TIRZEPATIDE 7.5 MG/0.5ML ~~LOC~~ SOAJ: 7.5000 mg | SUBCUTANEOUS | 0 refills | Status: DC

## 2023-08-07 MED ORDER — GADOPICLENOL 0.5 MMOL/ML IV SOLN
10.0000 mL | Freq: Once | INTRAVENOUS | Status: AC | PRN
  Administered 2023-08-07: 10 mL via INTRAVENOUS

## 2023-08-07 MED ORDER — BLOOD GLUCOSE MONITORING SUPPL DEVI
1.0000 | Freq: Three times a day (TID) | 0 refills | Status: DC
Start: 2023-08-07 — End: 2023-11-11

## 2023-08-10 ENCOUNTER — Encounter: Payer: Self-pay | Admitting: Family Medicine

## 2023-08-10 ENCOUNTER — Other Ambulatory Visit: Payer: Self-pay | Admitting: Internal Medicine

## 2023-08-10 DIAGNOSIS — E785 Hyperlipidemia, unspecified: Secondary | ICD-10-CM

## 2023-08-11 ENCOUNTER — Other Ambulatory Visit: Payer: Self-pay | Admitting: Family Medicine

## 2023-08-11 DIAGNOSIS — E785 Hyperlipidemia, unspecified: Secondary | ICD-10-CM

## 2023-08-11 MED ORDER — TRESIBA FLEXTOUCH 100 UNIT/ML ~~LOC~~ SOPN
40.0000 [IU] | PEN_INJECTOR | Freq: Every day | SUBCUTANEOUS | 0 refills | Status: AC
Start: 2023-08-11 — End: ?

## 2023-08-19 ENCOUNTER — Ambulatory Visit: Payer: BC Managed Care – PPO | Admitting: Family Medicine

## 2023-08-20 ENCOUNTER — Encounter: Payer: Self-pay | Admitting: Family Medicine

## 2023-08-23 DIAGNOSIS — Z419 Encounter for procedure for purposes other than remedying health state, unspecified: Secondary | ICD-10-CM | POA: Diagnosis not present

## 2023-09-18 ENCOUNTER — Encounter: Payer: Self-pay | Admitting: Family Medicine

## 2023-09-21 ENCOUNTER — Other Ambulatory Visit: Payer: Self-pay | Admitting: Family Medicine

## 2023-09-21 ENCOUNTER — Telehealth: Payer: Self-pay | Admitting: Pharmacist

## 2023-09-21 ENCOUNTER — Encounter: Payer: Self-pay | Admitting: Pharmacist

## 2023-09-21 DIAGNOSIS — E1169 Type 2 diabetes mellitus with other specified complication: Secondary | ICD-10-CM

## 2023-09-21 NOTE — Progress Notes (Signed)
Outreach Note  09/21/2023 Name: Monse Cunico MRN: [id number] DOB: 1961-10-26  Referred by: Alba Cory, MD Reason for referral : Medication Assistance   Receive a message from office regarding patient request for medication assistance. Was unable to reach patient via telephone today and unable to leave a message. Send patient a MyChart message in attempt to reach her.  Estelle Grumbles, PharmD, [facility] [phone]

## 2023-09-21 NOTE — Telephone Encounter (Signed)
This encounter was created in error - please disregard.

## 2023-09-24 ENCOUNTER — Encounter: Payer: Self-pay | Admitting: Family Medicine

## 2023-09-28 ENCOUNTER — Encounter: Payer: Self-pay | Admitting: Family Medicine

## 2023-10-02 NOTE — Telephone Encounter (Signed)
Copied from CRM [id number]. Topic: General - Other >> Oct 02, 2023 10:58 AM Phill Myron wrote: tirzepatide Greggory Keen)    Ms Coleson is requesting an increase dosage/strength. Please advise.  [facility] [address], [place] - [address] [place] [address] Phone: [phone] Fax: [phone]

## 2023-10-07 ENCOUNTER — Encounter: Payer: Self-pay | Admitting: Family Medicine

## 2023-10-08 ENCOUNTER — Ambulatory Visit: Payer: BC Managed Care – PPO | Admitting: Family Medicine

## 2023-10-08 ENCOUNTER — Other Ambulatory Visit: Payer: Self-pay

## 2023-10-08 ENCOUNTER — Ambulatory Visit (INDEPENDENT_AMBULATORY_CARE_PROVIDER_SITE_OTHER): Payer: BC Managed Care – PPO | Admitting: Nurse Practitioner

## 2023-10-08 ENCOUNTER — Encounter: Payer: Self-pay | Admitting: Nurse Practitioner

## 2023-10-08 ENCOUNTER — Ambulatory Visit: Payer: BC Managed Care – PPO | Admitting: Nurse Practitioner

## 2023-10-08 VITALS — BP 118/78 | HR 105 | Temp 98.2°F | Resp 16 | Ht 64.0 in | Wt 253.4 lb

## 2023-10-08 DIAGNOSIS — R202 Paresthesia of skin: Secondary | ICD-10-CM

## 2023-10-08 DIAGNOSIS — E1165 Type 2 diabetes mellitus with hyperglycemia: Secondary | ICD-10-CM | POA: Diagnosis not present

## 2023-10-08 DIAGNOSIS — Z794 Long term (current) use of insulin: Secondary | ICD-10-CM | POA: Diagnosis not present

## 2023-10-08 DIAGNOSIS — R42 Dizziness and giddiness: Secondary | ICD-10-CM

## 2023-10-08 MED ORDER — GVOKE HYPOPEN 2-PACK 1 MG/0.2ML ~~LOC~~ SOAJ: 1.0000 mg | SUBCUTANEOUS | 1 refills | Status: DC | PRN

## 2023-10-08 NOTE — Progress Notes (Signed)
BP 118/78   Pulse (!) 105   Temp 98.2 F (36.8 C) (Oral)   Resp 16   Ht 5\' 4"  (1.626 m)   Wt 253 lb 6.4 oz (114.9 kg)   SpO2 97%   BMI 43.50 kg/m    Subjective:    Patient ID: Lindsay Mcdonald, female    DOB: [birth year], 62 y.o.   MRN: [id number]  HPI: [place] Lindsay Mcdonald is a 62 y.o. female  Chief Complaint  Patient presents with   Dizziness    2 episodes of vertigo    Dizziness/feeling off/DM: she has been having a couple episodes. She reports that she has noticed that she has just felt off since turning 62.  Patient denies any chest pain or shortness of breath. She does report tingling in her legs which she is seeing neurology for.  She reports her blood sugar has been running in the 100s but occasionally she has noticed that it is lower and she feels shaky.  Discussed how she has been taking her insulin and she says she takes different amounts between 35-45 units a day. She also reports she does not check her sugar every day and that she changes what time of day she gives herself her insulin. Recommend being more consistent with treatment plan. Discussed checking fasting blood sugar and can adjust insulin every 3rd day if needed. Can decrease or increased by two units every third day until she finds a stable dose. Also discussed taking her insulin the same time every day.  She verbalized understanding. Will get labs and EKG.  Orthostatics were normal.   Orthostatic Vitals for the past 48 hrs (Last 6 readings):  BP Pulse Patient Position (if appropriate) BP- Standing at 0 minutes Pulse- Standing at 0 minutes BP- Sitting Pulse- Sitting BP- Lying Pulse- Lying  10/08/23 1137 118/78 (!) 105 -- -- -- -- -- -- --  10/08/23 1138 -- -- Orthostatic Vitals 110/72 85 110/72 77 122/84 90    Relevant past medical, surgical, family and social history reviewed and updated as indicated. Interim medical history since our last visit reviewed. Allergies and medications reviewed and updated.  Review of  Systems  Constitutional: Negative for fever or weight change.  Respiratory: Negative for cough and shortness of breath.   Cardiovascular: Negative for chest pain or palpitations.  Gastrointestinal: Negative for abdominal pain, no bowel changes.  Musculoskeletal: Negative for gait problem or joint swelling.  Skin: Negative for rash.  Neurological: positive for dizziness,negative headache.  No other specific complaints in a complete review of systems (except as listed in HPI above).      Objective:    BP 118/78   Pulse (!) 105   Temp 98.2 F (36.8 C) (Oral)   Resp 16   Ht 5\' 4"  (1.626 m)   Wt 253 lb 6.4 oz (114.9 kg)   SpO2 97%   BMI 43.50 kg/m   Wt Readings from Last 3 Encounters:  10/08/23 253 lb 6.4 oz (114.9 kg)  08/07/23 259 lb (117.5 kg)  05/15/23 266 lb 4.8 oz (120.8 kg)    Physical Exam  Constitutional: Patient appears well-developed and well-nourished. Obese  No distress.  HEENT: head atraumatic, normocephalic, pupils equal and reactive to light, ears TMs clear, neck supple, throat within normal limits Cardiovascular: Normal rate, regular rhythm and normal heart sounds.  No murmur heard. No BLE edema. Pulmonary/Chest: Effort normal and breath sounds normal. No respiratory distress. Abdominal: Soft.  There is no tenderness. Neuro:  equal grip,  romberg negative, no arm drift, steady gait Psychiatric: Patient has a normal mood and affect. behavior is normal. Judgment and thought content normal.  Results for orders placed or performed in visit on 08/07/23  POCT HgB A1C  Result Value Ref Range   Hemoglobin A1C 6.0 (A) 4.0 - 5.6 %   HbA1c POC (<> result, manual entry)     HbA1c, POC (prediabetic range)     HbA1c, POC (controlled diabetic range)        Assessment & Plan:   Problem List Items Addressed This Visit   None Visit Diagnoses     Type 2 diabetes mellitus with hyperglycemia, with long-term current use of insulin (HCC)    -  Primary   be more consistent  with administration of insulin, monitor blood sugar and bring to next appointment   Relevant Medications   GVOKE HYPOPEN 2-PACK 1 MG/0.2ML SOAJ   Dizziness       be more consistent with insulin, monitor blood sugar, getting labs and ekg, orthostatics normal. if no improvement follow up with neurology   Relevant Orders   EKG 12-Lead   CBC with Differential/Platelet   COMPLETE METABOLIC PANEL WITH GFR   TSH   Tingling in extremities       chronic condition, follow up with neurology   Relevant Orders   CBC with Differential/Platelet   TSH   Vitamin B12        Follow up plan: Return for has appointment scheduled with Dr. Carlynn Purl next month.

## 2023-10-09 ENCOUNTER — Encounter: Payer: Self-pay | Admitting: Nurse Practitioner

## 2023-10-09 LAB — CBC WITH DIFFERENTIAL/PLATELET
Absolute Lymphocytes: 2248 {cells}/uL (ref 850–3900)
Absolute Monocytes: 560 {cells}/uL (ref 200–950)
Basophils Absolute: 88 {cells}/uL (ref 0–200)
Basophils Relative: 1.1 %
Eosinophils Absolute: 352 {cells}/uL (ref 15–500)
Eosinophils Relative: 4.4 %
HCT: 40.2 % (ref 35.0–45.0)
Hemoglobin: 12.4 g/dL (ref 11.7–15.5)
MCH: 24.9 pg — ABNORMAL LOW (ref 27.0–33.0)
MCHC: 30.8 g/dL — ABNORMAL LOW (ref 32.0–36.0)
MCV: 80.9 fL (ref 80.0–100.0)
MPV: 11.4 fL (ref 7.5–12.5)
Monocytes Relative: 7 %
Neutro Abs: 4752 {cells}/uL (ref 1500–7800)
Neutrophils Relative %: 59.4 %
Platelets: 289 10*3/uL (ref 140–400)
RBC: 4.97 10*6/uL (ref 3.80–5.10)
RDW: 13.5 % (ref 11.0–15.0)
Total Lymphocyte: 28.1 %
WBC: 8 10*3/uL (ref 3.8–10.8)

## 2023-10-09 LAB — VITAMIN B12: Vitamin B-12: 1089 pg/mL (ref 200–1100)

## 2023-10-09 LAB — COMPLETE METABOLIC PANEL WITH GFR
AG Ratio: 1.3 (calc) (ref 1.0–2.5)
ALT: 11 U/L (ref 6–29)
AST: 13 U/L (ref 10–35)
Albumin: 3.7 g/dL (ref 3.6–5.1)
Alkaline phosphatase (APISO): 72 U/L (ref 37–153)
BUN: 25 mg/dL (ref 7–25)
CO2: 26 mmol/L (ref 20–32)
Calcium: 9.3 mg/dL (ref 8.6–10.4)
Chloride: 104 mmol/L (ref 98–110)
Creat: 1.01 mg/dL (ref 0.50–1.05)
Globulin: 2.8 g/dL (ref 1.9–3.7)
Glucose, Bld: 119 mg/dL — ABNORMAL HIGH (ref 65–99)
Potassium: 4.8 mmol/L (ref 3.5–5.3)
Sodium: 140 mmol/L (ref 135–146)
Total Bilirubin: 0.3 mg/dL (ref 0.2–1.2)
Total Protein: 6.5 g/dL (ref 6.1–8.1)
eGFR: 64 mL/min/{1.73_m2} (ref >=60)

## 2023-10-09 LAB — TSH: TSH: 3.27 m[IU]/L (ref 0.40–4.50)

## 2023-10-23 ENCOUNTER — Encounter: Payer: Self-pay | Admitting: Nurse Practitioner

## 2023-10-23 ENCOUNTER — Encounter: Payer: Self-pay | Admitting: Family Medicine

## 2023-10-23 DIAGNOSIS — Z419 Encounter for procedure for purposes other than remedying health state, unspecified: Secondary | ICD-10-CM | POA: Diagnosis not present

## 2023-11-03 ENCOUNTER — Encounter: Payer: Self-pay | Admitting: Nurse Practitioner

## 2023-11-03 ENCOUNTER — Encounter: Payer: Self-pay | Admitting: Family Medicine

## 2023-11-04 ENCOUNTER — Encounter: Payer: Self-pay | Admitting: Family Medicine

## 2023-11-10 NOTE — Progress Notes (Unsigned)
Name: Lindsay Mcdonald   MRN: [id number]    DOB: 11-08-1961   Date:11/11/2023       Progress Note  Subjective  Chief Complaint  Follow Up  HPI  FMS: she has a long history of feeling aching and tender worse on trunk and upper extremities. Pain on shoulders, arms, stiff on her hips .Stiffiness is worse when she first gets up in the mornings, she has some mental fogginess, she has tried Lyrica and Duloxetine but stopped on her own. She stopped taking Nortriptyline but is taking  Aleve prn   Mild Major Depression: she is finally off BZD and willing to try SSRI, she is currently taking zoloft 100 mg daily , she is wiling to see psychiatrist now . We will place referral. She used to take alprazolam for GAD - she is still depressed, likely also needs therapist   Insomnia: she has tried Hydroxyzine, Seroquel, Unisom and Ambien, Trazodone. Sleeping better ,  baclofen and melatonin at night . She stopped taking Nortryptline   OA: seeing Dr. Landry Mellow and had steroid injections on both knees ,no effusion or redness, pain resolved, she has a history of bakers cysts, knees are stiff intermittently but doing better, she has also had injections on both shoulder,  also uses topical medications on her knees.  Stable  DMII: . She has been taking Mounjaro since Summer 2024 ,she is still taking Metformin but down one tablet daily , Evaristo Bury currently at 30 units daily and Mounjaro 7.5 mg weekly. Fasting levels have improved - usually below 100 but above 80. Discussed going up on Mounjaro to 10 units and down on Tresiba by 2 units every other day to keep glucose around 100. Marland Kitchen  Continue Meformins. No polyphagia, polydipsia or polyuria. She has neuropathy on both legs, described as pins and needles  she stopped Nortriptyline due to lack of improvement of symptoms    OSA: she has not been back for titration yet, she states not having problems lately, used to gasp for air but states symptoms not as bad lately . Neurologist  is not aware she has not been back for titration  HTN: she is now on Benicar 40 mg, no chest pain or palpation . BP is ast goal. Continue medication    Hyperlipidemia: she has tried multiple statins including Livalo and zetia but all of the medications caused muscle pain. Fish oil was too expensive   Morbid Obesity: BMI is still above goal ,, she had initially lost weight on Mounjaro but now weight is stable.  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 She has an upcoming visit with psyastrist   Patient Active Problem List   Diagnosis Date Noted   Herniated intervertebral disc of lumbar spine 08/07/2023   B12 deficiency 05/15/2023   Occult blood positive stool 02/03/2023   Adenomatous polyp of colon 02/03/2023   Dyslipidemia associated with type 2 diabetes mellitus (HCC) 05/21/2021   Hypertension associated with diabetes (HCC) 08/31/2020   History of marijuana use 03/30/2019   Fibromyalgia 12/31/2018   Venous vascular malformations 03/27/2016   Seasonal allergic rhinitis 09/26/2015   Muscle spasms of neck  06/19/2015   Insomnia 06/19/2015   Primary osteoarthritis of right knee 06/17/2015   Depression, major, recurrent, mild (HCC) 06/17/2015   Hyperlipidemia 06/17/2015   Benign hypertension 06/17/2015   Morbid obesity with BMI of 40.0-44.9, adult (HCC) 06/17/2015   Vitamin D deficiency 06/17/2015    Past Surgical History:  Procedure Laterality Date   BREAST REDUCTION SURGERY     COLONOSCOPY WITH PROPOFOL N/A 02/03/2023   Procedure: COLONOSCOPY WITH PROPOFOL;  Surgeon: Wyline Mood, MD;  Location: [facility] ENDOSCOPY;  Service:  Gastroenterology;  Laterality: N/A;   ETHMOIDECTOMY Right 05/16/2020   Procedure: ETHMOIDECTOMY;  Surgeon: Vernie Murders, MD;  Location: [facility] ORS;  Service: ENT;  Laterality: Right;   EXCISION ORAL TUMOR N/A 05/16/2020   Procedure: EXCISION OF SOFT PALATE PAPILLOMA;  Surgeon: Vernie Murders, MD;  Location: [facility] ORS;  Service: ENT;  Laterality: N/A;   FRONTAL SINUS EXPLORATION Right 05/16/2020   Procedure: FRONTAL SINUS EXPLORATION;  Surgeon: Vernie Murders, MD;  Location: [facility] ORS;  Service: ENT;  Laterality: Right;   IMAGE GUIDED SINUS SURGERY N/A 05/16/2020   Procedure: IMAGE GUIDED SINUS SURGERY;  Surgeon: Vernie Murders, MD;  Location: [facility] ORS;  Service: ENT;  Laterality: N/A;   MAXILLARY ANTROSTOMY Bilateral 05/16/2020   Procedure: MAXILLARY ANTROSTOMY with tissue removal;  Surgeon: Vernie Murders, MD;  Location: [facility] ORS;  Service: ENT;  Laterality: Bilateral;   nasal endoscopic     OOPHORECTOMY  ?   one ovary removed    REDUCTION MAMMAPLASTY Bilateral 2000   removal of ovary     SPHENOIDECTOMY Right 05/16/2020   Procedure: SPHENOIDECTOMY;  Surgeon: Vernie Murders, MD;  Location: [facility] ORS;  Service: ENT;  Laterality: Right;    Family History  Problem Relation Age of Onset   Diabetes Mother    Cancer Mother 18       Breast   Breast cancer Mother 20   Gout Mother    Diabetes Father    Diabetes Daughter        Oldest Daughter   Breast cancer Maternal Aunt 55   Kidney cancer Maternal Aunt    Kidney disease Maternal Aunt    Stroke Maternal Uncle    Cancer Half-Sister 64       breast cancer   Prostate cancer Neg Hx    Bladder Cancer Neg Hx     Social History   Tobacco Use   Smoking status: Never   Smokeless tobacco: Never  Substance Use Topics   Alcohol use: No    Alcohol/week: 0.0 standard drinks of alcohol     Current Outpatient Medications:    aspirin EC 81 MG tablet, Take 81 mg by mouth daily. Swallow whole., Disp: , Rfl:    baclofen (LIORESAL) 10 MG tablet, Take 10 mg  by mouth at bedtime., Disp: , Rfl:    Cholecalciferol (VITAMIN D) 50 MCG (2000 UT) CAPS, Take 1 each by mouth daily at 12 noon., Disp: , Rfl:    cromolyn (OPTICROM) 4 % ophthalmic solution, Place 1 drop into both eyes 4 (four) times daily as needed., Disp: 10 mL, Rfl: 2   GVOKE HYPOPEN 2-PACK 1 MG/0.2ML SOAJ, Inject 1 mg into the skin as needed (for hypoglycemia episodes (glucose below 60 and symptomatic))., Disp: 0.4 mL, Rfl: 1   Melatonin 10 MG TABS, Take 10 mg by mouth at bedtime as needed (sleep). , Disp: , Rfl:    metFORMIN (GLUCOPHAGE-XR) 750 MG 24 hr tablet, Take 2 tablets (1,500 mg total) by mouth daily with breakfast.,  Disp: 180 tablet, Rfl: 1   nortriptyline (PAMELOR) 10 MG capsule, Take 20 mg by mouth at bedtime., Disp: , Rfl:    olmesartan (BENICAR) 40 MG tablet, Take 1 tablet (40 mg total) by mouth daily. In place of lisinopril for bp, Disp: 90 tablet, Rfl: 1   sertraline (ZOLOFT) 100 MG tablet, Take 1.5 tablets (150 mg total) by mouth daily., Disp: 135 tablet, Rfl: 1   tirzepatide (MOUNJARO) 7.5 MG/0.5ML Pen, Inject 7.5 mg into the skin once a week., Disp: 6 mL, Rfl: 0   TRESIBA FLEXTOUCH 100 UNIT/ML FlexTouch Pen, INJECT 40-50 UNITS INTO THE SKIN DAILY., Disp: 15 mL, Rfl: 0   Blood Glucose Monitoring Suppl DEVI, 1 each by Does not apply route in the morning, at noon, and at bedtime. May substitute to any manufacturer covered by patient's insurance. (Patient not taking: Reported on 10/08/2023), Disp: 1 each, Rfl: 0   fluticasone (FLONASE) 50 MCG/ACT nasal spray, Place 2 sprays into both nostrils daily as needed for allergies or rhinitis. (Patient not taking: Reported on 08/07/2023), Disp: , Rfl:    Insulin Pen Needle 32G X 6 MM MISC, 1 each by Does not apply route daily at 2 PM. (Patient not taking: Reported on 10/08/2023), Disp: 100 each, Rfl: 1   Lancet Device MISC, 1 each by Does not apply route in the morning and at bedtime. May substitute to any manufacturer covered by patient's  insurance. (Patient not taking: Reported on 10/08/2023), Disp: 1 each, Rfl: 0   Lancets Misc. MISC, 1 each by Does not apply route 2 (two) times daily as needed. May substitute to any manufacturer covered by patient's insurance. (Patient not taking: Reported on 10/08/2023), Disp: 100 each, Rfl: 0  Allergies  Allergen Reactions   Simvastatin     Muscle cramps    Trazodone Hcl     It may have caused sob    Zetia [Ezetimibe] Other (See Comments)    Myalgia    Atorvastatin Other (See Comments)    muscle cramps   Compazine Other (See Comments)    Twitching and spasms of neck muscles   Penicillins Rash    Reaction under 25, patient unsure that she still has an allergy    I personally reviewed active problem list, medication list, allergies, family history, social history, health maintenance with the patient/caregiver today.   ROS  Ten systems reviewed and is negative except as mentioned in HPI    Objective  Vitals:   11/11/23 1001  BP: 122/70  Pulse: 89  Resp: 16  SpO2: 99%  Weight: 254 lb (115.2 kg)  Height: 5\' 4"  (1.626 m)    Body mass index is 43.6 kg/m.  Physical Exam  Constitutional: Patient appears well-developed and well-nourished. Obese  No distress.  HEENT: head atraumatic, normocephalic, pupils equal and reactive to light, neck supple Cardiovascular: Normal rate, regular rhythm and normal heart sounds.  No murmur heard. No BLE edema. Pulmonary/Chest: Effort normal and breath sounds normal. No respiratory distress. Abdominal: Soft.  There is no tenderness. Psychiatric: Patient has a normal mood and affect. behavior is normal. Judgment and thought content normal.    PHQ2/9:    11/11/2023   10:02 AM 10/08/2023   11:36 AM 08/07/2023    2:02 PM 05/15/2023   10:02 AM 02/27/2023    1:23 PM  Depression screen PHQ 2/9  Decreased Interest 1 1 2 1  0  Down, Depressed, Hopeless 1 1 1 1  0  PHQ - 2 Score 2 2 3  2  0  Altered sleeping 1 1 1 1  0  Tired, decreased energy  1 0 1 2 0  Change in appetite 0 0 0 3 0  Feeling bad or failure about yourself  0 1 1 2  0  Trouble concentrating 0 0 0 0 0  Moving slowly or fidgety/restless 0 0 0 0 0  Suicidal thoughts 0 0 0 0 0  PHQ-9 Score 4 4 6 10  0  Difficult doing work/chores  Not difficult at all  Somewhat difficult Not difficult at all    phq 9 is positive    Fall Risk:    11/11/2023   10:02 AM 10/08/2023   11:36 AM 08/07/2023    2:02 PM 05/15/2023   10:01 AM 02/27/2023    1:23 PM  Fall Risk   Falls in the past year? 0 0 0 0 0  Number falls in past yr: 0 0 0  0  Injury with Fall? 0 0 0  0  Risk for fall due to : No Fall Risks No Fall Risks No Fall Risks No Fall Risks No Fall Risks  Follow up Falls prevention discussed Falls prevention discussed Falls prevention discussed Falls prevention discussed Falls prevention discussed;Education provided;Falls evaluation completed      Functional Status Survey: Is the patient deaf or have difficulty hearing?: No Does the patient have difficulty seeing, even when wearing glasses/contacts?: No Does the patient have difficulty concentrating, remembering, or making decisions?: Yes Does the patient have difficulty walking or climbing stairs?: Yes Does the patient have difficulty dressing or bathing?: No Does the patient have difficulty doing errands alone such as visiting a doctor's office or shopping?: No    Assessment & Plan  1. Diabetic peripheral neuropathy associated with type 2 diabetes mellitus (HCC)  - POCT HgB A1C - metFORMIN (GLUCOPHAGE-XR) 750 MG 24 hr tablet; Take 1 tablet (750 mg total) by mouth daily with breakfast.  Dispense: 90 tablet; Refill: 0  2. Depression, major, recurrent, mild (HCC)  - sertraline (ZOLOFT) 100 MG tablet; Take 1 tablet (100 mg total) by mouth daily.  Dispense: 90 tablet; Refill: 0 - Ambulatory referral to Psychiatry  3. Need for immunization against influenza  - Flu vaccine trivalent PF, 6mos and  older(Flulaval,Afluria,Fluarix,Fluzone)  4. GAD (generalized anxiety disorder)  - sertraline (ZOLOFT) 100 MG tablet; Take 1 tablet (100 mg total) by mouth daily.  Dispense: 90 tablet; Refill: 0 - Ambulatory referral to Psychiatry  5. Hypertension associated with diabetes (HCC)  BP is at goal   6. Fibromyalgia  stable  7. Statin myopathy  Also unable to tolerate Zetia\  8. Vitamin D deficiency   Continue supplements

## 2023-11-11 ENCOUNTER — Encounter: Payer: Self-pay | Admitting: Family Medicine

## 2023-11-11 ENCOUNTER — Ambulatory Visit: Payer: BC Managed Care – PPO | Admitting: Family Medicine

## 2023-11-11 ENCOUNTER — Ambulatory Visit (INDEPENDENT_AMBULATORY_CARE_PROVIDER_SITE_OTHER): Payer: BC Managed Care – PPO | Admitting: Family Medicine

## 2023-11-11 VITALS — BP 122/70 | HR 89 | Resp 16 | Ht 64.0 in | Wt 254.0 lb

## 2023-11-11 DIAGNOSIS — E1142 Type 2 diabetes mellitus with diabetic polyneuropathy: Secondary | ICD-10-CM

## 2023-11-11 DIAGNOSIS — I152 Hypertension secondary to endocrine disorders: Secondary | ICD-10-CM

## 2023-11-11 DIAGNOSIS — F411 Generalized anxiety disorder: Secondary | ICD-10-CM | POA: Diagnosis not present

## 2023-11-11 DIAGNOSIS — Z23 Encounter for immunization: Secondary | ICD-10-CM | POA: Diagnosis not present

## 2023-11-11 DIAGNOSIS — G720 Drug-induced myopathy: Secondary | ICD-10-CM

## 2023-11-11 DIAGNOSIS — Z7985 Long-term (current) use of injectable non-insulin antidiabetic drugs: Secondary | ICD-10-CM

## 2023-11-11 DIAGNOSIS — E1159 Type 2 diabetes mellitus with other circulatory complications: Secondary | ICD-10-CM

## 2023-11-11 DIAGNOSIS — F330 Major depressive disorder, recurrent, mild: Secondary | ICD-10-CM | POA: Diagnosis not present

## 2023-11-11 DIAGNOSIS — T466X5D Adverse effect of antihyperlipidemic and antiarteriosclerotic drugs, subsequent encounter: Secondary | ICD-10-CM

## 2023-11-11 DIAGNOSIS — E1165 Type 2 diabetes mellitus with hyperglycemia: Secondary | ICD-10-CM

## 2023-11-11 DIAGNOSIS — E559 Vitamin D deficiency, unspecified: Secondary | ICD-10-CM

## 2023-11-11 DIAGNOSIS — M797 Fibromyalgia: Secondary | ICD-10-CM

## 2023-11-11 LAB — POCT GLYCOSYLATED HEMOGLOBIN (HGB A1C): Hemoglobin A1C: 6.2 % — AB (ref 4.0–5.6)

## 2023-11-11 MED ORDER — TIRZEPATIDE 10 MG/0.5ML ~~LOC~~ SOAJ: 10.0000 mg | SUBCUTANEOUS | 0 refills | Status: DC

## 2023-11-11 MED ORDER — METFORMIN HCL ER 750 MG PO TB24: 750.0000 mg | ORAL_TABLET | Freq: Every day | ORAL | 0 refills | Status: DC

## 2023-11-11 MED ORDER — SERTRALINE HCL 100 MG PO TABS
100.0000 mg | ORAL_TABLET | Freq: Every day | ORAL | 0 refills | Status: DC
Start: 2023-11-11 — End: 2024-02-04

## 2023-11-21 ENCOUNTER — Other Ambulatory Visit: Payer: Self-pay | Admitting: Nurse Practitioner

## 2023-11-21 DIAGNOSIS — E1169 Type 2 diabetes mellitus with other specified complication: Secondary | ICD-10-CM

## 2023-11-22 DIAGNOSIS — Z419 Encounter for procedure for purposes other than remedying health state, unspecified: Secondary | ICD-10-CM | POA: Diagnosis not present

## 2023-11-23 ENCOUNTER — Other Ambulatory Visit: Payer: Self-pay | Admitting: Family Medicine

## 2023-11-23 ENCOUNTER — Encounter: Payer: Self-pay | Admitting: Family Medicine

## 2023-11-23 DIAGNOSIS — E1169 Type 2 diabetes mellitus with other specified complication: Secondary | ICD-10-CM

## 2023-11-24 ENCOUNTER — Encounter: Payer: Self-pay | Admitting: Family Medicine

## 2023-11-24 ENCOUNTER — Encounter: Payer: Self-pay | Admitting: Nurse Practitioner

## 2023-11-24 MED ORDER — TRESIBA FLEXTOUCH 100 UNIT/ML ~~LOC~~ SOPN
40.0000 [IU] | PEN_INJECTOR | Freq: Every day | SUBCUTANEOUS | 0 refills | Status: DC
Start: 2023-11-24 — End: 2024-01-18

## 2023-11-24 NOTE — Telephone Encounter (Signed)
Reordered 11/24/23 15 ml  Requested Prescriptions  Refused Prescriptions Disp Refills   TRESIBA FLEXTOUCH 100 UNIT/ML FlexTouch Pen [Pharmacy Med Name: TRESIBA FLEXTOUCH 100UNIT SOLN PEN-INJ] 15 mL 0    Sig: INJECT 40-50 UNITS INTO THE SKIN DAILY.     Endocrinology:  Diabetes - Insulins Passed - 11/21/2023 11:41 AM      Passed - HBA1C is between 0 and 7.9 and within 180 days    Hemoglobin A1C  Date Value Ref Range Status  11/11/2023 6.2 (A) 4.0 - 5.6 % Final   HbA1c, POC (prediabetic range)  Date Value Ref Range Status  12/31/2018 7.0 (A) 5.7 - 6.4 % Final   HbA1c, POC (controlled diabetic range)  Date Value Ref Range Status  09/28/2019 7.9 (A) 0.0 - 7.0 % Final   Hgb A1c MFr Bld  Date Value Ref Range Status  02/19/2022 7.4 (H) <5.7 % of total Hgb Final    Comment:    For someone without known diabetes, a hemoglobin A1c value of 6.5% or greater indicates that they may have  diabetes and this should be confirmed with a follow-up  test. . For someone with known diabetes, a value <7% indicates  that their diabetes is well controlled and a value  greater than or equal to 7% indicates suboptimal  control. A1c targets should be individualized based on  duration of diabetes, age, comorbid conditions, and  other considerations. . Currently, no consensus exists regarding use of hemoglobin A1c for diagnosis of diabetes for children. Verna Czech - Valid encounter within last 6 months    Recent Outpatient Visits           1 week ago Diabetic peripheral neuropathy associated with type 2 diabetes mellitus [facility])   [place] [facility] Niarada, Danna Hefty, MD   1 month ago Type 2 diabetes mellitus with hyperglycemia, with long-term current use of insulin [facility])   [place] [facility] Della Goo F, FNP   3 months ago Dyslipidemia associated with type 2 diabetes mellitus [facility])   [place] [facility] Alba Cory, MD   6  months ago Dyslipidemia associated with type 2 diabetes mellitus [facility])   [place] [facility] Alba Cory, MD   9 months ago Conjunctivitis of both eyes, unspecified conjunctivitis type   [facility] Danelle Berry, [place]       Future Appointments             In 2 months Alba Cory, MD [facility], [facility]

## 2023-11-30 ENCOUNTER — Other Ambulatory Visit: Payer: Self-pay | Admitting: Family Medicine

## 2023-11-30 DIAGNOSIS — F330 Major depressive disorder, recurrent, mild: Secondary | ICD-10-CM

## 2023-12-10 NOTE — Telephone Encounter (Signed)
Copied from CRM [id number]. Topic: Referral - Question >> Dec 10, 2023 10:08 AM Haroldine Laws wrote: Reason for CRM: Pt called saying Dr. Maryruth Bun does not accept her insurance.  She would like to try [facility] psy.  Saramma Eappen.  CB@  [phone]

## 2023-12-11 ENCOUNTER — Telehealth: Payer: Self-pay | Admitting: Family Medicine

## 2023-12-11 DIAGNOSIS — F330 Major depressive disorder, recurrent, mild: Secondary | ICD-10-CM

## 2023-12-11 NOTE — Telephone Encounter (Signed)
Patient request to a call back from [facility]

## 2023-12-11 NOTE — Telephone Encounter (Signed)
Pt is calling to request if referral could be changed from Dr. Maryruth Bun to [facility] psy. Saramma Eappen. Dr. Maryruth Bun is not in network with her insurance. MyChart message sent yesterday. Referral coordinator advised that she would change referral  Not completed at this time.  Please advise CB- [phone]

## 2023-12-11 NOTE — Telephone Encounter (Signed)
New referral placed and pt notified.

## 2023-12-17 ENCOUNTER — Encounter: Payer: Self-pay | Admitting: Family Medicine

## 2023-12-18 ENCOUNTER — Other Ambulatory Visit: Payer: Self-pay

## 2023-12-18 ENCOUNTER — Other Ambulatory Visit: Payer: Self-pay | Admitting: Family Medicine

## 2023-12-18 ENCOUNTER — Encounter: Payer: Self-pay | Admitting: Family Medicine

## 2023-12-18 DIAGNOSIS — E1169 Type 2 diabetes mellitus with other specified complication: Secondary | ICD-10-CM

## 2023-12-18 MED ORDER — TIRZEPATIDE 10 MG/0.5ML ~~LOC~~ SOAJ: 10.0000 mg | SUBCUTANEOUS | 0 refills | Status: DC

## 2023-12-23 DIAGNOSIS — Z419 Encounter for procedure for purposes other than remedying health state, unspecified: Secondary | ICD-10-CM | POA: Diagnosis not present

## 2023-12-24 DIAGNOSIS — M5126 Other intervertebral disc displacement, lumbar region: Secondary | ICD-10-CM | POA: Diagnosis not present

## 2023-12-24 DIAGNOSIS — M5416 Radiculopathy, lumbar region: Secondary | ICD-10-CM | POA: Diagnosis not present

## 2024-01-01 DIAGNOSIS — M5126 Other intervertebral disc displacement, lumbar region: Secondary | ICD-10-CM | POA: Diagnosis not present

## 2024-01-01 DIAGNOSIS — M5416 Radiculopathy, lumbar region: Secondary | ICD-10-CM | POA: Diagnosis not present

## 2024-01-18 ENCOUNTER — Other Ambulatory Visit: Payer: Self-pay | Admitting: Family Medicine

## 2024-01-18 DIAGNOSIS — E785 Hyperlipidemia, unspecified: Secondary | ICD-10-CM

## 2024-01-18 DIAGNOSIS — I152 Hypertension secondary to endocrine disorders: Secondary | ICD-10-CM

## 2024-01-21 ENCOUNTER — Encounter: Payer: Self-pay | Admitting: Family Medicine

## 2024-01-22 ENCOUNTER — Other Ambulatory Visit: Payer: Self-pay | Admitting: Family Medicine

## 2024-01-22 DIAGNOSIS — E1142 Type 2 diabetes mellitus with diabetic polyneuropathy: Secondary | ICD-10-CM

## 2024-01-22 MED ORDER — TOUJEO SOLOSTAR 300 UNIT/ML ~~LOC~~ SOPN
40.0000 [IU] | PEN_INJECTOR | Freq: Every day | SUBCUTANEOUS | 0 refills | Status: DC
Start: 2024-01-22 — End: 2024-02-04

## 2024-01-23 DIAGNOSIS — Z419 Encounter for procedure for purposes other than remedying health state, unspecified: Secondary | ICD-10-CM | POA: Diagnosis not present

## 2024-01-29 DIAGNOSIS — G8929 Other chronic pain: Secondary | ICD-10-CM | POA: Diagnosis not present

## 2024-01-29 DIAGNOSIS — M5416 Radiculopathy, lumbar region: Secondary | ICD-10-CM | POA: Diagnosis not present

## 2024-01-29 DIAGNOSIS — M7631 Iliotibial band syndrome, right leg: Secondary | ICD-10-CM | POA: Diagnosis not present

## 2024-01-29 DIAGNOSIS — M25561 Pain in right knee: Secondary | ICD-10-CM | POA: Diagnosis not present

## 2024-01-29 DIAGNOSIS — M5126 Other intervertebral disc displacement, lumbar region: Secondary | ICD-10-CM | POA: Diagnosis not present

## 2024-01-29 DIAGNOSIS — M1711 Unilateral primary osteoarthritis, right knee: Secondary | ICD-10-CM | POA: Diagnosis not present

## 2024-02-04 ENCOUNTER — Encounter: Payer: Self-pay | Admitting: Family Medicine

## 2024-02-04 ENCOUNTER — Ambulatory Visit: Payer: 59 | Admitting: Family Medicine

## 2024-02-04 VITALS — BP 124/74 | HR 98 | Temp 97.9°F | Resp 16 | Ht 64.0 in | Wt 251.0 lb

## 2024-02-04 DIAGNOSIS — Z7984 Long term (current) use of oral hypoglycemic drugs: Secondary | ICD-10-CM | POA: Diagnosis not present

## 2024-02-04 DIAGNOSIS — I152 Hypertension secondary to endocrine disorders: Secondary | ICD-10-CM

## 2024-02-04 DIAGNOSIS — E1169 Type 2 diabetes mellitus with other specified complication: Secondary | ICD-10-CM

## 2024-02-04 DIAGNOSIS — G720 Drug-induced myopathy: Secondary | ICD-10-CM

## 2024-02-04 DIAGNOSIS — M797 Fibromyalgia: Secondary | ICD-10-CM | POA: Diagnosis not present

## 2024-02-04 DIAGNOSIS — G4733 Obstructive sleep apnea (adult) (pediatric): Secondary | ICD-10-CM

## 2024-02-04 DIAGNOSIS — F330 Major depressive disorder, recurrent, mild: Secondary | ICD-10-CM | POA: Diagnosis not present

## 2024-02-04 DIAGNOSIS — M5126 Other intervertebral disc displacement, lumbar region: Secondary | ICD-10-CM | POA: Diagnosis not present

## 2024-02-04 DIAGNOSIS — E1142 Type 2 diabetes mellitus with diabetic polyneuropathy: Secondary | ICD-10-CM | POA: Diagnosis not present

## 2024-02-04 DIAGNOSIS — E1159 Type 2 diabetes mellitus with other circulatory complications: Secondary | ICD-10-CM

## 2024-02-04 DIAGNOSIS — Z6841 Body Mass Index (BMI) 40.0 and over, adult: Secondary | ICD-10-CM

## 2024-02-04 DIAGNOSIS — E669 Obesity, unspecified: Secondary | ICD-10-CM

## 2024-02-04 LAB — HM DIABETES EYE EXAM

## 2024-02-04 LAB — POCT GLYCOSYLATED HEMOGLOBIN (HGB A1C): Hemoglobin A1C: 6.6 % — AB (ref 4.0–5.6)

## 2024-02-04 MED ORDER — TOUJEO SOLOSTAR 300 UNIT/ML ~~LOC~~ SOPN: 40.0000 [IU] | PEN_INJECTOR | Freq: Every day | SUBCUTANEOUS | 0 refills | Status: DC

## 2024-02-04 MED ORDER — METFORMIN HCL ER 750 MG PO TB24: 750.0000 mg | ORAL_TABLET | Freq: Every day | ORAL | 0 refills | Status: DC

## 2024-02-04 MED ORDER — OMEGA-3-ACID ETHYL ESTERS 1 G PO CAPS
2.0000 g | ORAL_CAPSULE | Freq: Two times a day (BID) | ORAL | 1 refills | Status: DC
Start: 2024-02-04 — End: 2024-06-21

## 2024-02-04 MED ORDER — OLMESARTAN MEDOXOMIL 40 MG PO TABS: 40.0000 mg | ORAL_TABLET | Freq: Every day | ORAL | 0 refills | Status: DC

## 2024-02-04 MED ORDER — TIRZEPATIDE 7.5 MG/0.5ML ~~LOC~~ SOAJ
7.5000 mg | SUBCUTANEOUS | 0 refills | Status: DC
Start: 2024-02-04 — End: 2024-02-23

## 2024-02-04 NOTE — Progress Notes (Signed)
Name: Lindsay Mcdonald   MRN: [id number]    DOB: March 05, 1961   Date:02/04/2024       Progress Note  Subjective  Chief Complaint  Chief Complaint  Patient presents with   Medical Management of Chronic Issues   HPI   FMS: she has a long history of feeling aching and tender worse on trunk and upper extremities. Pain on shoulders, arms, stiff on her hips.  She has some mental fogginess, she has tried Lyrica and Duloxetine but stopped on her own. She stopped taking Nortriptyline , currently only taking Aleve once or twice a day- discussed risk of kidney dysfunction with daily NSAID's   Mild Major Depression recurrent: she is finally off BZD and willing to try SSRI, she was taking zoloft 100 mg daily , but weaned self off about one month ago, she states doing well, she will keep her visit with psychiatrist next week She used to take alprazolam for GAD.   Insomnia: she has tried Hydroxyzine, Seroquel, Unisom and Ambien, Trazodone also Nortriptyline . Sleeping better with a combination of baclofen, melatonin , zyrtec and camomile tea and lavender essential oils behind her ear   OA: seeing Dr. Landry Mellow , having locking sensation on right outer knee and he suggested PT    DMII: She has been taking Mounjaro since Summer 2024 ,she is still taking Metformin 750 mg daily, Toujeo 30 units, not as compliant with her diet lately and A1C went up a little to 6.6 % but still at goal, we will try adjusting dose of Mounjaro to 12.5 mg. She denies polyphagia, polydipsia or polyuria. She has associated HTN, dyslipidemia, peripheral neuropathy . Stable    OSA: she has not been back for titration yet, she states not having problems lately, used to gasp for air but states symptoms not as bad lately . Neurologist is not aware she has not been back for titration. Unchanged    HTN: she is now on Benicar 40 mg, no chest pain or palpation . BP is at goal    Hyperlipidemia: she has tried multiple statins including Livalo and  zetia but all of the medications caused muscle pain. Fish oil was too expensive. She has medicaid and we will send fish oil again to see if it goes through   Morbid Obesity: BMI is still above goal , she had initially lost weight on Mounjaro but now weight is stable, down just a few pounds since last visit .     DDD cervical spine: she went to  [facility] 12/23/2019 for evaluation, had c- spine x-ray that showed DDD C5-6, also had a normal shoulder x-ray, but MRI shoulder showed rotator cuff tear  Symptoms still present, she takes muscle relaxer prn . She also has herniated disc of lumbar spine and is now under the care of psyatrist    Nocturnal leg cramps:  seeing neurologist , she has neuropathy, taking B12 , up to date with psyastrist    Patient Active Problem List   Diagnosis Date Noted   Herniated intervertebral disc of lumbar spine 08/07/2023   B12 deficiency 05/15/2023   Occult blood positive stool 02/03/2023   Adenomatous polyp of colon 02/03/2023   Dyslipidemia associated with type 2 diabetes mellitus (HCC) 05/21/2021   Hypertension associated with diabetes (HCC) 08/31/2020   History of marijuana use 03/30/2019   Fibromyalgia 12/31/2018   Venous vascular malformations 03/27/2016   Seasonal allergic rhinitis 09/26/2015   Muscle spasms of neck 06/19/2015   Insomnia 06/19/2015  Primary osteoarthritis of right knee 06/17/2015   Depression, major, recurrent, mild (HCC) 06/17/2015   Hyperlipidemia 06/17/2015   Benign hypertension 06/17/2015   Morbid obesity with BMI of 40.0-44.9, adult (HCC) 06/17/2015   Vitamin D deficiency 06/17/2015    Past Surgical History:  Procedure Laterality Date   BREAST REDUCTION SURGERY     COLONOSCOPY WITH PROPOFOL N/A 02/03/2023   Procedure: COLONOSCOPY WITH PROPOFOL;  Surgeon: Wyline Mood, MD;  Location: [facility] ENDOSCOPY;  Service: Gastroenterology;  Laterality: N/A;   ETHMOIDECTOMY Right 05/16/2020   Procedure: ETHMOIDECTOMY;  Surgeon: Vernie Murders, MD;   Location: [facility] ORS;  Service: ENT;  Laterality: Right;   EXCISION ORAL TUMOR N/A 05/16/2020   Procedure: EXCISION OF SOFT PALATE PAPILLOMA;  Surgeon: Vernie Murders, MD;  Location: [facility] ORS;  Service: ENT;  Laterality: N/A;   FRONTAL SINUS EXPLORATION Right 05/16/2020   Procedure: FRONTAL SINUS EXPLORATION;  Surgeon: Vernie Murders, MD;  Location: [facility] ORS;  Service: ENT;  Laterality: Right;   IMAGE GUIDED SINUS SURGERY N/A 05/16/2020   Procedure: IMAGE GUIDED SINUS SURGERY;  Surgeon: Vernie Murders, MD;  Location: [facility] ORS;  Service: ENT;  Laterality: N/A;   MAXILLARY ANTROSTOMY Bilateral 05/16/2020   Procedure: MAXILLARY ANTROSTOMY with tissue removal;  Surgeon: Vernie Murders, MD;  Location: [facility] ORS;  Service: ENT;  Laterality: Bilateral;   nasal endoscopic     OOPHORECTOMY  ?   one ovary removed    REDUCTION MAMMAPLASTY Bilateral 2000   removal of ovary     SPHENOIDECTOMY Right 05/16/2020   Procedure: SPHENOIDECTOMY;  Surgeon: Vernie Murders, MD;  Location: [facility] ORS;  Service: ENT;  Laterality: Right;    Family History  Problem Relation Age of Onset   Diabetes Mother    Cancer Mother 57       Breast   Breast cancer Mother 28   Gout Mother    Diabetes Father    Diabetes Daughter        Oldest Daughter   Breast cancer Maternal Aunt 55   Kidney cancer Maternal Aunt    Kidney disease Maternal Aunt    Stroke Maternal Uncle    Cancer Half-Sister 34       breast cancer   Prostate cancer Neg Hx    Bladder Cancer Neg Hx     Social History   Tobacco Use   Smoking status: Never   Smokeless tobacco: Never  Substance Use Topics   Alcohol use: No    Alcohol/week: 0.0 standard drinks of alcohol     Current Outpatient Medications:    ACCU-CHEK GUIDE TEST test strip, USE THREE TIMES DAILY TO TEST BLOOD SUGAR. MORNING, NOON AND BEDTIME., Disp: 100 strip, Rfl: 0   Accu-Chek Softclix Lancets lancets, USE TO TEST BLOOD SUGAR THREE TIMES DAILY. MORNING, NOON AND BEDTIME., Disp: 100 each, Rfl:  0   aspirin EC 81 MG tablet, Take 81 mg by mouth daily. Swallow whole., Disp: , Rfl:    baclofen (LIORESAL) 10 MG tablet, Take 10 mg by mouth at bedtime., Disp: , Rfl:    Cholecalciferol (VITAMIN D) 50 MCG (2000 UT) CAPS, Take 1 each by mouth daily at 12 noon., Disp: , Rfl:    cromolyn (OPTICROM) 4 % ophthalmic solution, Place 1 drop into both eyes 4 (four) times daily as needed., Disp: 10 mL, Rfl: 2   GVOKE HYPOPEN 2-PACK 1 MG/0.2ML SOAJ, Inject 1 mg into the skin as needed (for hypoglycemia episodes (glucose below 60 and symptomatic))., Disp: 0.4 mL, Rfl: 1  meclizine (ANTIVERT) 25 MG tablet, , Disp: , Rfl:    Melatonin 10 MG TABS, Take 10 mg by mouth at bedtime as needed (sleep). , Disp: , Rfl:    omega-3 acid ethyl esters (LOVAZA) 1 g capsule, Take 2 capsules (2 g total) by mouth 2 (two) times daily., Disp: 360 capsule, Rfl: 1   tirzepatide (MOUNJARO) 7.5 MG/0.5ML Pen, Inject 7.5 mg into the skin once a week., Disp: 6 mL, Rfl: 0   fluticasone (FLONASE) 50 MCG/ACT nasal spray, Place 2 sprays into both nostrils daily as needed for allergies or rhinitis. (Patient not taking: Reported on 02/04/2024), Disp: , Rfl:    insulin glargine, 1 Unit Dial, (TOUJEO SOLOSTAR) 300 UNIT/ML Solostar Pen, Inject 40-50 Units into the skin daily at 12 noon., Disp: 4.5 mL, Rfl: 0   Insulin Pen Needle 32G X 6 MM MISC, 1 each by Does not apply route daily at 2 PM. (Patient not taking: Reported on 02/04/2024), Disp: 100 each, Rfl: 1   metFORMIN (GLUCOPHAGE-XR) 750 MG 24 hr tablet, Take 1 tablet (750 mg total) by mouth daily with breakfast., Disp: 90 tablet, Rfl: 0   olmesartan (BENICAR) 40 MG tablet, Take 1 tablet (40 mg total) by mouth daily., Disp: 90 tablet, Rfl: 0  Allergies  Allergen Reactions   Simvastatin     Muscle cramps    Trazodone Hcl     It may have caused sob    Zetia [Ezetimibe] Other (See Comments)    Myalgia    Atorvastatin Other (See Comments)    muscle cramps   Compazine Other (See Comments)     Twitching and spasms of neck muscles   Penicillins Rash    Reaction under 25, patient unsure that she still has an allergy    I personally reviewed active problem list, medication list, allergies, family history with the patient/caregiver today.   ROS  Constitutional: Negative for fever or weight change.  Respiratory: Negative for cough and shortness of breath.   Cardiovascular: Negative for chest pain or palpitations.  Gastrointestinal: Negative for abdominal pain, no bowel changes.  Musculoskeletal: Negative for gait problem or joint swelling.  Skin: Negative for rash.  Neurological: Negative for dizziness or headache.  No other specific complaints in a complete review of systems (except as listed in HPI above).   Objective  Vitals:   02/04/24 0856  BP: 124/74  Pulse: 98  Resp: 16  Temp: 97.9 F (36.6 C)  TempSrc: Oral  SpO2: 99%  Weight: 251 lb (113.9 kg)  Height: 5\' 4"  (1.626 m)    Body mass index is 43.08 kg/m.  Physical Exam  Constitutional: Patient appears well-developed and well-nourished. Obese  No distress.  HEENT: head atraumatic, normocephalic, pupils equal and reactive to light, neck supple Cardiovascular: Normal rate, regular rhythm and normal heart sounds.  No murmur heard. No BLE edema. Pulmonary/Chest: Effort normal and breath sounds normal. No respiratory distress. Abdominal: Soft.  There is no tenderness. Psychiatric: Patient has a normal mood and affect. behavior is normal. Judgment and thought content normal.   Recent Results (from the past 2160 hours)  POCT HgB A1C     Status: Abnormal   Collection Time: 11/11/23 10:05 AM  Result Value Ref Range   Hemoglobin A1C 6.2 (A) 4.0 - 5.6 %   HbA1c POC (<> result, manual entry)     HbA1c, POC (prediabetic range)     HbA1c, POC (controlled diabetic range)    POCT glycosylated hemoglobin (Hb A1C)  Status: Abnormal   Collection Time: 02/04/24  9:00 AM  Result Value Ref Range   Hemoglobin A1C  6.6 (A) 4.0 - 5.6 %   HbA1c POC (<> result, manual entry)     HbA1c, POC (prediabetic range)     HbA1c, POC (controlled diabetic range)      Diabetic Foot Exam:  Title   Diabetic Foot Exam - detailed Visual Foot Exam completed.: Yes   Pulse Foot Exam completed.: Yes      Sensory Foot Exam Completed.: Yes Semmes-Weinstein Monofilament Test "+" means "has sensation" and "-" means "no sensation"      Image components are not supported.   Image components are not supported. Image components are not supported.  [place] Comments      PHQ2/9:    02/04/2024    8:55 AM 11/11/2023   10:02 AM 10/08/2023   11:36 AM 08/07/2023    2:02 PM 05/15/2023   10:02 AM  Depression screen PHQ 2/9  Decreased Interest 0 1 1 2 1   Down, Depressed, Hopeless 0 1 1 1 1   PHQ - 2 Score 0 2 2 3 2   Altered sleeping 0 1 1 1 1   Tired, decreased energy 0 1 0 1 2  Change in appetite 0 0 0 0 3  Feeling bad or failure about yourself  0 0 1 1 2   Trouble concentrating 0 0 0 0 0  Moving slowly or fidgety/restless 0 0 0 0 0  Suicidal thoughts 0 0 0 0 0  PHQ-9 Score 0 4 4 6 10   Difficult doing work/chores Not difficult at all  Not difficult at all  Somewhat difficult    phq 9 is negative  Fall Risk:    02/04/2024    8:47 AM 11/11/2023   10:02 AM 10/08/2023   11:36 AM 08/07/2023    2:02 PM 05/15/2023   10:01 AM  Fall Risk   Falls in the past year? 0 0 0 0 0  Number falls in past yr: 0 0 0 0   Injury with Fall? 0 0 0 0   Risk for fall due to : No Fall Risks No Fall Risks No Fall Risks No Fall Risks No Fall Risks  Follow up Falls prevention discussed;Falls evaluation completed;Education provided Falls prevention discussed Falls prevention discussed Falls prevention discussed Falls prevention discussed     Assessment & Plan  1. Diabetic peripheral neuropathy associated with type 2 diabetes mellitus (HCC) (Primary)  - POCT glycosylated hemoglobin (Hb A1C) - HM Diabetes Foot Exam - Microalbumin  / creatinine urine ratio - metFORMIN (GLUCOPHAGE-XR) 750 MG 24 hr tablet; Take 1 tablet (750 mg total) by mouth daily with breakfast.  Dispense: 90 tablet; Refill: 0 - insulin glargine, 1 Unit Dial, (TOUJEO SOLOSTAR) 300 UNIT/ML Solostar Pen; Inject 40-50 Units into the skin daily at 12 noon.  Dispense: 4.5 mL; Refill: 0 - tirzepatide (MOUNJARO) 7.5 MG/0.5ML Pen; Inject 7.5 mg into the skin once a week.  Dispense: 6 mL; Refill: 0  2. Depression, major, recurrent, mild (HCC)  Off medication, going to see psychiatrist next week   3. Obesity, diabetes, and hypertension syndrome (HCC)  - HM Diabetes Foot Exam - Microalbumin / creatinine urine ratio  4. Morbid obesity with BMI of 40.0-44.9, adult [place])  Discussed with the patient the risk posed by an increased BMI. Discussed importance of portion control, calorie counting and at least 150 minutes of physical activity weekly. Avoid sweet beverages and drink more water. Eat at  least 6 servings of fruit and vegetables daily    5. Hypertension associated with diabetes (HCC)  - olmesartan (BENICAR) 40 MG tablet; Take 1 tablet (40 mg total) by mouth daily.  Dispense: 90 tablet; Refill: 0  6. Dyslipidemia associated with type 2 diabetes mellitus (HCC)  - omega-3 acid ethyl esters (LOVAZA) 1 g capsule; Take 2 capsules (2 g total) by mouth 2 (two) times daily.  Dispense: 360 capsule; Refill: 1  7. Fibromyalgia  Stable  8. Statin myopathy  - omega-3 acid ethyl esters (LOVAZA) 1 g capsule; Take 2 capsules (2 g total) by mouth 2 (two) times daily.  Dispense: 360 capsule; Refill: 1  9. Herniated intervertebral disc of lumbar spine  Continue Psyatrist   10. OSA (obstructive sleep apnea)   She still had not had sleep study - she states unable to sleep with mask , even though she never tried it

## 2024-02-08 ENCOUNTER — Ambulatory Visit (INDEPENDENT_AMBULATORY_CARE_PROVIDER_SITE_OTHER): Payer: 59 | Admitting: Psychiatry

## 2024-02-08 ENCOUNTER — Encounter: Payer: Self-pay | Admitting: Psychiatry

## 2024-02-08 VITALS — BP 124/85 | HR 103 | Temp 96.1°F | Ht 64.0 in | Wt 256.6 lb

## 2024-02-08 DIAGNOSIS — F419 Anxiety disorder, unspecified: Secondary | ICD-10-CM | POA: Insufficient documentation

## 2024-02-08 DIAGNOSIS — F320 Major depressive disorder, single episode, mild: Secondary | ICD-10-CM | POA: Insufficient documentation

## 2024-02-08 DIAGNOSIS — R0981 Nasal congestion: Secondary | ICD-10-CM | POA: Diagnosis not present

## 2024-02-08 DIAGNOSIS — R42 Dizziness and giddiness: Secondary | ICD-10-CM | POA: Diagnosis not present

## 2024-02-08 MED ORDER — VILAZODONE HCL 20 MG PO TABS
20.0000 mg | ORAL_TABLET | Freq: Every morning | ORAL | 1 refills | Status: DC
Start: 2024-02-14 — End: 2024-05-12

## 2024-02-08 MED ORDER — HYDROXYZINE HCL 10 MG PO TABS: 10.0000 mg | ORAL_TABLET | Freq: Three times a day (TID) | ORAL | 1 refills | Status: DC | PRN

## 2024-02-08 MED ORDER — VILAZODONE HCL 10 MG PO TABS: 10.0000 mg | ORAL_TABLET | Freq: Every day | ORAL | 0 refills | Status: DC

## 2024-02-08 NOTE — Patient Instructions (Addendum)
 [email or web address] [address] [place], [place], [place] [address]    [email or web address] [address] [place], [place], [place] [address]   [phone]   [email or web address] [address], [place], [place] [address]   [phone]   Hydroxyzine Capsules or Tablets What is this medication? HYDROXYZINE (hye DROX i zeen) treats the symptoms of allergies and allergic reactions. It may also be used to treat anxiety or cause drowsiness before a procedure. It works by blocking histamine, a substance released by the body during an allergic reaction. It belongs to a group of medications called antihistamines. This medicine may be used for other purposes; ask your health care provider or pharmacist if you have questions. COMMON BRAND NAME(S): ANX, Atarax, Rezine, Vistaril What should I tell my care team before I take this medication? They need to know if you have any of these conditions: Glaucoma Heart disease Irregular heartbeat or rhythm Kidney disease Liver disease Lung or breathing disease, such as asthma Stomach or intestine problems Thyroid disease Trouble passing urine An unusual or allergic reaction to hydroxyzine, other medications, foods, dyes or preservatives Pregnant or trying to get pregnant Breastfeeding How should I use this medication? Take this medication by mouth with a full glass of water. Take it as directed on the prescription label at the same time every day. You can take it with or without food. If it upsets your stomach, take it with food. Talk to your care team about the use of this medication in children. While it may be prescribed for children as young as 6 years for selected conditions, precautions do apply. People 65 years and older may have a stronger reaction and need a smaller dose. Overdosage: If you think you have taken too much of this medicine contact a poison control  center or emergency room at once. NOTE: This medicine is only for you. Do not share this medicine with others. What if I miss a dose? If you miss a dose, take it as soon as you can. If it is almost time for your next dose, take only that dose. Do not take double or extra doses. What may interact with this medication? Do not take this medication with any of the following: Cisapride Dronedarone Pimozide Thioridazine This medication may also interact with the following: Alcohol Antihistamines for allergy, cough, and cold Atropine Barbiturate medications for sleep or seizures, such as phenobarbital Certain antibiotics, such as erythromycin or clarithromycin Certain medications for anxiety or sleep Certain medications for bladder problems, such as oxybutynin or tolterodine Certain medications for irregular heartbeat Certain medications for mental health conditions Certain medications for Parkinson disease, such as benztropine, trihexyphenidyl Certain medications for seizures, such as phenobarbital or primidone Certain medications for stomach problems, such as dicyclomine or hyoscyamine Certain medications for travel sickness, such as scopolamine Ipratropium Opioid medications for pain Other medications that cause heart rhythm changes, such as dofetilide This list may not describe all possible interactions. Give your health care provider a list of all the medicines, herbs, non-prescription drugs, or dietary supplements you use. Also tell them if you smoke, drink alcohol, or use illegal drugs. Some items may interact with your medicine. What should I watch for while using this medication? Visit your care team for regular checks on your progress. Tell your care team if your symptoms do not start to get better or if they get worse. This medication may affect your coordination, reaction time, or judgment. Do not drive or  operate machinery until you know how this medication affects you. Sit up or  stand slowly to reduce the risk of dizzy or fainting spells. Drinking alcohol with this medication can increase the risk of these side effects. Your mouth may get dry. Chewing sugarless gum or sucking hard candy and drinking plenty of water may help. Contact your care team if the problem does not go away or is severe. This medication may cause dry eyes and blurred vision. If you wear contact lenses, you may feel some discomfort. Lubricating eye drops may help. See your care team if the problem does not go away or is severe. If you are receiving skin tests for allergies, tell your care team you are taking this medication. What side effects may I notice from receiving this medication? Side effects that you should report to your care team as soon as possible: Allergic reactions--skin rash, itching, hives, swelling of the face, lips, tongue, or throat Heart rhythm changes--fast or irregular heartbeat, dizziness, feeling faint or lightheaded, chest pain, trouble breathing Side effects that usually do not require medical attention (report to your care team if they continue or are bothersome): Confusion Drowsiness Dry mouth Hallucinations Headache This list may not describe all possible side effects. Call your doctor for medical advice about side effects. You may report side effects to FDA at 1-800-FDA-1088. Where should I keep my medication? Keep out of the reach of children and pets. Store at room temperature between 15 and 30 degrees C (59 and 86 degrees F). Keep container tightly closed. Throw away any unused medication after the expiration date. NOTE: This sheet is a summary. It may not cover all possible information. If you have questions about this medicine, talk to your doctor, pharmacist, or health care provider.  2024 Elsevier/Gold Standard (2022-07-18 00:00:00)  Vilazodone Tablets What is this medication? VILAZODONE (vil [place] oh done) treats depression. It increases the amount of serotonin  in the brain, a substance that helps regulate mood. This medicine may be used for other purposes; ask your health care provider or pharmacist if you have questions. COMMON BRAND NAME(S): [id number] What should I tell my care team before I take this medication? They need to know if you have any of these conditions: Bipolar disorder or a family history of bipolar disorder Glaucoma Liver disease Low levels of sodium in the blood Receiving electroconvulsive therapy (ECT) Seizures Suicidal thoughts, plans, or attempt by you or a family member An unusual or allergic reaction to vilazodone, other medications, foods, dyes, or preservatives Pregnant or trying to get pregnant Breastfeeding How should I use this medication? Take this medication by mouth with water. Take it as directed on the prescription label at the same time every day. Take it with food. Keep taking it unless your care team tells you to stop. A special MedGuide will be given to you by the pharmacist with each prescription and refill. Be sure to read this information carefully each time. Talk to your care team about the use of this medication in children. Special care may be needed. Overdosage: If you think you have taken too much of this medicine contact a poison control center or emergency room at once. NOTE: This medicine is only for you. Do not share this medicine with others. What if I miss a dose? If you miss a dose, take it as soon as you can. If it is almost time for your next dose, take only that dose. Do not take double or extra doses. What  may interact with this medication? Do not take this medication with any of the following: Linezolid MAOIs, such as Marplan, Nardil, and Parnate Methylene blue (injected into a vein) This medication may also interact with the following: Aspirin and aspirin-like medications Certain medications for depression, anxiety, or other mental health conditions Certain medications for migraines,  such as sumatriptan Certain medications for seizures Lithium Medications that treat or prevent blood clots, such as warfarin NSAIDs, medications for pain and inflammation, such as ibuprofen or naproxen Opioids St. John's wort Stimulant medications for ADHD, weight loss, or staying awake Tryptophan This list may not describe all possible interactions. Give your health care provider a list of all the medicines, herbs, non-prescription drugs, or dietary supplements you use. Also tell them if you smoke, drink alcohol, or use illegal drugs. Some items may interact with your medicine. What should I watch for while using this medication? Visit your care team for regular checks on your progress. It may be some time before you see the benefit from this medication. This medication may cause thoughts of suicide or depression. This includes sudden changes in mood, behaviors, or thoughts. These changes can happen at any time but are more common in the beginning of treatment or after a change in dose. Call your care team right away if you experience these thoughts or worsening depression. This medication may affect your coordination, reaction time, or judgment. Do not drive or operate machinery until you know how this medication affects you. Sit up or stand slowly to reduce the risk of dizzy or fainting spells. Drinking alcohol with this medication can increase the risk of these side effects. Your mouth may get dry. Chewing sugarless gum or sucking hard candy and drinking plenty of water may help. Contact your care team if the problem does not go away or is severe. What side effects may I notice from receiving this medication? Side effects that you should report to your care team as soon as possible: Allergic reactions--skin rash, itching, hives, swelling of the face, lips, tongue, or throat Bleeding--bloody or black, tar-like stools, vomiting blood or brown material that looks like coffee grounds, red or dark  brown urine, small red or purple spots on skin, unusual bruising or bleeding Irritability, confusion, fast or irregular heartbeat, muscle stiffness, twitching muscles, sweating, high fever, seizure, chills, vomiting, diarrhea, which may be signs of serotonin syndrome Low sodium level--muscle weakness, fatigue, dizziness, headache, confusion Seizures Sudden eye pain or change in vision such as blurry vision, seeing halos around lights, vision loss Thoughts of suicide or self-harm, worsening mood, feelings of depression Side effects that usually do not require medical attention (report to your care team if they continue or are bothersome): Change in sex drive or performance Diarrhea Dry mouth Headache Nausea Trouble sleeping Vomiting This list may not describe all possible side effects. Call your doctor for medical advice about side effects. You may report side effects to FDA at 1-800-FDA-1088. Where should I keep my medication? Keep out of the reach of children. Store at room temperature between 15 and 30 degrees C (59 to 86 degrees F). Throw away any unused medication after the expiration date. NOTE: This sheet is a summary. It may not cover all possible information. If you have questions about this medicine, talk to your doctor, pharmacist, or health care provider.  2024 Elsevier/Gold Standard (2023-04-29 00:00:00) [phone]
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
## 2024-02-12 ENCOUNTER — Ambulatory Visit: Payer: BC Managed Care – PPO | Admitting: Family Medicine

## 2024-02-14 ENCOUNTER — Other Ambulatory Visit: Payer: Self-pay

## 2024-02-14 ENCOUNTER — Emergency Department
Admission: EM | Admit: 2024-02-14 | Discharge: 2024-02-14 | Disposition: A | Discharge status: Home / Self Care | Payer: 59 | Attending: Emergency Medicine | Admitting: Emergency Medicine

## 2024-02-14 ENCOUNTER — Encounter: Payer: Self-pay | Admitting: Emergency Medicine

## 2024-02-14 ENCOUNTER — Emergency Department: Payer: 59

## 2024-02-14 DIAGNOSIS — Z794 Long term (current) use of insulin: Secondary | ICD-10-CM | POA: Insufficient documentation

## 2024-02-14 DIAGNOSIS — Z79899 Other long term (current) drug therapy: Secondary | ICD-10-CM | POA: Insufficient documentation

## 2024-02-14 DIAGNOSIS — Z7982 Long term (current) use of aspirin: Secondary | ICD-10-CM | POA: Insufficient documentation

## 2024-02-14 DIAGNOSIS — R079 Chest pain, unspecified: Secondary | ICD-10-CM | POA: Diagnosis present

## 2024-02-14 DIAGNOSIS — R0789 Other chest pain: Secondary | ICD-10-CM | POA: Diagnosis not present

## 2024-02-14 DIAGNOSIS — E119 Type 2 diabetes mellitus without complications: Secondary | ICD-10-CM | POA: Insufficient documentation

## 2024-02-14 DIAGNOSIS — I10 Essential (primary) hypertension: Secondary | ICD-10-CM | POA: Insufficient documentation

## 2024-02-14 DIAGNOSIS — Z7984 Long term (current) use of oral hypoglycemic drugs: Secondary | ICD-10-CM | POA: Diagnosis not present

## 2024-02-14 LAB — BASIC METABOLIC PANEL
Anion gap: 8 (ref 5–15)
BUN: 25 mg/dL — ABNORMAL HIGH (ref 6–20)
CO2: 25 mmol/L (ref 22–32)
Calcium: 8.7 mg/dL — ABNORMAL LOW (ref 8.9–10.3)
Chloride: 107 mmol/L (ref 98–111)
Creatinine, Ser: 0.7 mg/dL (ref 0.44–1.00)
GFR, Estimated: >60 mL/min (ref >60)
Glucose, Bld: 147 mg/dL — ABNORMAL HIGH (ref 70–99)
Potassium: 3.8 mmol/L (ref 3.5–5.1)
Sodium: 140 mmol/L (ref 135–145)

## 2024-02-14 LAB — LIPASE, BLOOD: Lipase: 49 U/L (ref 11–51)

## 2024-02-14 LAB — CBC
HCT: 38.6 % (ref 36.0–46.0)
Hemoglobin: 12.1 g/dL (ref 12.0–15.0)
MCH: 26.1 pg (ref 26.0–34.0)
MCHC: 31.3 g/dL (ref 30.0–36.0)
MCV: 83.4 fL (ref 80.0–100.0)
Platelets: 231 10*3/uL (ref 150–400)
RBC: 4.63 MIL/uL (ref 3.87–5.11)
RDW: 13.9 % (ref 11.5–15.5)
WBC: 9.8 10*3/uL (ref 4.0–10.5)
nRBC: 0.2 % (ref 0.0–0.2)

## 2024-02-14 LAB — HEPATIC FUNCTION PANEL
ALT: 26 U/L (ref 0–44)
AST: 29 U/L (ref 15–41)
Albumin: 3.2 g/dL — ABNORMAL LOW (ref 3.5–5.0)
Alkaline Phosphatase: 66 U/L (ref 38–126)
Bilirubin, Direct: 0.1 mg/dL (ref 0.0–0.2)
Indirect Bilirubin: 0.3 mg/dL (ref 0.3–0.9)
Total Bilirubin: 0.4 mg/dL (ref 0.0–1.2)
Total Protein: 6.7 g/dL (ref 6.5–8.1)

## 2024-02-14 LAB — RESP PANEL BY RT-PCR (RSV, FLU A&B, COVID)  RVPGX2
Influenza A by PCR: NEGATIVE
Influenza B by PCR: NEGATIVE
Resp Syncytial Virus by PCR: NEGATIVE
SARS Coronavirus 2 by RT PCR: NEGATIVE

## 2024-02-14 LAB — TROPONIN I (HIGH SENSITIVITY)
Troponin I (High Sensitivity): 4 ng/L (ref <18)
Troponin I (High Sensitivity): 3 ng/L (ref <18)

## 2024-02-14 LAB — D-DIMER, QUANTITATIVE: D-Dimer, Quant: 0.34 ug{FEU}/mL (ref 0.00–0.50)

## 2024-02-14 MED ORDER — NAPROXEN 500 MG PO TABS
500.0000 mg | ORAL_TABLET | Freq: Two times a day (BID) | ORAL | 0 refills | Status: DC | PRN
Start: 2024-02-14 — End: 2024-10-13

## 2024-02-14 MED ORDER — KETOROLAC TROMETHAMINE 30 MG/ML IJ SOLN
15.0000 mg | Freq: Once | INTRAMUSCULAR | Status: AC
  Administered 2024-02-14: 15 mg via INTRAVENOUS
  Filled 2024-02-14: qty 1

## 2024-02-14 MED ORDER — FAMOTIDINE IN NACL 20-0.9 MG/50ML-% IV SOLN
20.0000 mg | Freq: Once | INTRAVENOUS | Status: AC
  Administered 2024-02-14: 20 mg via INTRAVENOUS
  Filled 2024-02-14: qty 50

## 2024-02-14 NOTE — ED Triage Notes (Signed)
 Patient pov from home chest pain started yesterday morning worse last night when laying down, history of high blood pressure

## 2024-02-14 NOTE — Discharge Instructions (Signed)
 You may take pain medicine as needed. Apply moist heat to affected area several times daily. Return to the ER for worsening symptoms, persistent vomiting, difficulty breathing or other concerns.

## 2024-02-14 NOTE — ED Provider Notes (Signed)
 [facility] Provider Note    Event Date/Time   First MD Initiated Contact with Patient 02/14/24 [phone]     (approximate)   History   Chest Pain   HPI  Lindsay Mcdonald is a 63 y.o. female who presents to the ED from home with a chief complaint of right-sided chest pain starting yesterday morning, worse last night when laying down and returning this morning.  Describes right-sided throbbing, nonradiating, not associated with diaphoresis, palpitations, nausea/vomiting or dizziness.  Denies recent fever/chills, cough, abdominal pain.  Denies recent trauma, travel or hormone use.  States she has been doing many loads of laundry.     Past Medical History   Past Medical History:  Diagnosis Date   Anxiety    Arthritis 03/2020   osteoarthritis both knees   Chronic pain of right knee    Chronic sinusitis    DDD (degenerative disc disease), cervical    cervical radiculopathy   Depression    Diabetes mellitus    insulin dependant   Fibromyalgia    GERD (gastroesophageal reflux disease)    High cholesterol    Hyperlipidemia    Hypertension    Insomnia    Muscle pain    Nasal polyp    Neuromuscular disorder (HCC)    diabetic neuropathy   Obesity    Palpitations    Tendonitis of knee, left 04/2020   received steroid injection   Vitamin D deficiency    Vitamin D deficiency      Active Problem List   Patient Active Problem List   Diagnosis Date Noted   Current mild episode of major depressive disorder without prior episode (HCC) 02/08/2024   Anxiety disorder 02/08/2024   Herniated intervertebral disc of lumbar spine 08/07/2023   B12 deficiency 05/15/2023   Occult blood positive stool 02/03/2023   Adenomatous polyp of colon 02/03/2023   Dyslipidemia associated with type 2 diabetes mellitus (HCC) 05/21/2021   Hypertension associated with diabetes (HCC) 08/31/2020   History of marijuana use 03/30/2019   Fibromyalgia 12/31/2018   Venous vascular  malformations 03/27/2016   Seasonal allergic rhinitis 09/26/2015   Muscle spasms of neck 06/19/2015   Insomnia 06/19/2015   Primary osteoarthritis of right knee 06/17/2015   Depression, major, recurrent, mild (HCC) 06/17/2015   Hyperlipidemia 06/17/2015   Benign hypertension 06/17/2015   Morbid obesity with BMI of 40.0-44.9, adult (HCC) 06/17/2015   Vitamin D deficiency 06/17/2015     Past Surgical History   Past Surgical History:  Procedure Laterality Date   BREAST REDUCTION SURGERY     COLONOSCOPY WITH PROPOFOL N/A 02/03/2023   Procedure: COLONOSCOPY WITH PROPOFOL;  Surgeon: Wyline Mood, MD;  Location: [facility] ENDOSCOPY;  Service: Gastroenterology;  Laterality: N/A;   ETHMOIDECTOMY Right 05/16/2020   Procedure: ETHMOIDECTOMY;  Surgeon: Vernie Murders, MD;  Location: [facility] ORS;  Service: ENT;  Laterality: Right;   EXCISION ORAL TUMOR N/A 05/16/2020   Procedure: EXCISION OF SOFT PALATE PAPILLOMA;  Surgeon: Vernie Murders, MD;  Location: [facility] ORS;  Service: ENT;  Laterality: N/A;   FRONTAL SINUS EXPLORATION Right 05/16/2020   Procedure: FRONTAL SINUS EXPLORATION;  Surgeon: Vernie Murders, MD;  Location: [facility] ORS;  Service: ENT;  Laterality: Right;   IMAGE GUIDED SINUS SURGERY N/A 05/16/2020   Procedure: IMAGE GUIDED SINUS SURGERY;  Surgeon: Vernie Murders, MD;  Location: [facility] ORS;  Service: ENT;  Laterality: N/A;   MAXILLARY ANTROSTOMY Bilateral 05/16/2020   Procedure: MAXILLARY ANTROSTOMY with tissue removal;  Surgeon: Vernie Murders, MD;  Location: [facility] ORS;  Service: ENT;  Laterality: Bilateral;   nasal endoscopic     OOPHORECTOMY  ?   one ovary removed    REDUCTION MAMMAPLASTY Bilateral 2000   removal of ovary     SPHENOIDECTOMY Right 05/16/2020   Procedure: SPHENOIDECTOMY;  Surgeon: Vernie Murders, MD;  Location: [facility] ORS;  Service: ENT;  Laterality: Right;     Home Medications   Prior to Admission medications   Medication Sig Start Date End Date Taking? Authorizing Provider  ACCU-CHEK  GUIDE TEST test strip USE THREE TIMES DAILY TO TEST BLOOD SUGAR. MORNING, NOON AND BEDTIME. 12/18/23   Alba Cory, MD  Accu-Chek Softclix Lancets lancets USE TO TEST BLOOD SUGAR THREE TIMES DAILY. MORNING, NOON AND BEDTIME. 12/18/23   Alba Cory, MD  aspirin EC 81 MG tablet Take 81 mg by mouth daily. Swallow whole.    [provider]  baclofen (LIORESAL) 10 MG tablet Take 10 mg by mouth at bedtime.    Morene Crocker, MD  Cholecalciferol (VITAMIN D) 50 MCG (2000 UT) CAPS Take 1 each by mouth daily at 12 noon.    [provider]  fluticasone (FLONASE) 50 MCG/ACT nasal spray Place 2 sprays into both nostrils daily as needed for allergies or rhinitis.    [provider]  GVOKE HYPOPEN 2-PACK 1 MG/0.2ML SOAJ Inject 1 mg into the skin as needed (for hypoglycemia episodes (glucose below 60 and symptomatic)). 10/08/23   Berniece Salines, FNP  hydrOXYzine (ATARAX) 10 MG tablet Take 1 tablet (10 mg total) by mouth 3 (three) times daily as needed for anxiety. 02/08/24   Jomarie Longs, MD  insulin glargine, 1 Unit Dial, (TOUJEO SOLOSTAR) 300 UNIT/ML Solostar Pen Inject 40-50 Units into the skin daily at 12 noon. 02/04/24   Alba Cory, MD  Insulin Pen Needle 32G X 6 MM MISC 1 each by Does not apply route daily at 2 PM. 06/01/23   Alba Cory, MD  meclizine (ANTIVERT) 25 MG tablet  01/18/24   [provider]  Melatonin 10 MG TABS Take 10 mg by mouth at bedtime as needed (sleep).     [provider]  metFORMIN (GLUCOPHAGE-XR) 750 MG 24 hr tablet Take 1 tablet (750 mg total) by mouth daily with breakfast. 02/04/24   Alba Cory, MD  olmesartan (BENICAR) 40 MG tablet Take 1 tablet (40 mg total) by mouth daily. 02/04/24   Alba Cory, MD  omega-3 acid ethyl esters (LOVAZA) 1 g capsule Take 2 capsules (2 g total) by mouth 2 (two) times daily. 02/04/24   Alba Cory, MD  tirzepatide [facility]) 7.5 MG/0.5ML Pen Inject 7.5 mg into the skin once a  week. 02/04/24   Alba Cory, MD  Vilazodone HCl (VIIBRYD) 10 MG TABS Take 1 tablet (10 mg total) by mouth daily for 7 days. Stop after taking for a week . 02/08/24 02/15/24  Jomarie Longs, MD  Vilazodone HCl 20 MG TABS Take 1 tablet (20 mg total) by mouth in the morning. Please start taking after stopping the 10 mg . 02/14/24 04/14/24  Jomarie Longs, MD     Allergies  Simvastatin, Trazodone hcl, Zetia [ezetimibe], Atorvastatin, Compazine, and Penicillins   Family History   Family History  Problem Relation Age of Onset   Dementia Mother    Diabetes Mother    Cancer Mother 56       Breast   Breast cancer Mother 74   Gout Mother    Diabetes Father    Breast  cancer Maternal Aunt 55   Kidney cancer Maternal Aunt    Kidney disease Maternal Aunt    Stroke Maternal Uncle    Alcohol abuse Daughter    Diabetes Daughter        Oldest Daughter   Alcohol abuse Daughter    Cancer Half-Sister 2       breast cancer   Prostate cancer Neg Hx    Bladder Cancer Neg Hx      Physical Exam  Triage Vital Signs: ED Triage Vitals  Encounter Vitals Group     BP 02/14/24 0315 (!) 160/87     Systolic BP Percentile --      Diastolic BP Percentile --      Pulse Rate 02/14/24 0315 84     Resp 02/14/24 0315 18     Temp 02/14/24 0315 98.4 F (36.9 C)     Temp Source 02/14/24 0315 Oral     SpO2 02/14/24 0315 100 %     Weight 02/14/24 0318 256 lb (116.1 kg)     Height 02/14/24 0318 5\' 4"  (1.626 m)     Head Circumference --      Peak Flow --      Pain Score 02/14/24 0318 5     Pain Loc --      Pain Education --      Exclude from Growth Chart --     Updated Vital Signs: BP (!) 160/87 (BP Location: Right Arm)   Pulse 84   Temp 98.4 F (36.9 C) (Oral)   Resp 18   Ht 5\' 4"  (1.626 m)   Wt 116.1 kg   SpO2 100%   BMI 43.94 kg/m    General: Awake, no distress.  CV:  RRR.  Good peripheral perfusion.  Resp:  Normal effort.  CTAB.  Right upper anterior chest tender to  palpation. Abd:  Nontender.  No distention.  Other:  Bilateral calves are supple and nontender.  ED Results / Procedures / Treatments  Labs (all labs ordered are listed, but only abnormal results are displayed) Labs Reviewed  BASIC METABOLIC PANEL - Abnormal; Notable for the following components:      Result Value   Glucose, Bld 147 (*)    BUN 25 (*)    Calcium 8.7 (*)    All other components within normal limits  RESP PANEL BY RT-PCR (RSV, FLU A&B, COVID)  RVPGX2  CBC  HEPATIC FUNCTION PANEL  LIPASE, BLOOD  D-DIMER, QUANTITATIVE  TROPONIN I (HIGH SENSITIVITY)     EKG  ED ECG REPORT I, [place] Perra J, the attending physician, personally viewed and interpreted this ECG.   Date: 02/14/2024  EKG Time: 0317  Rate: 84  Rhythm: normal sinus rhythm  Axis: Normal  Intervals:none  ST&T Change: Nonspecific    RADIOLOGY I have independently visualized and interpreted patient's imaging study as well as noted the radiology interpretation:  Chest x-ray: No acute cardiopulmonary process  Official radiology report(s): DG Chest 2 View Result Date: 02/14/2024 CLINICAL DATA:  Chest pain, hypertension EXAM: CHEST - 2 VIEW COMPARISON:  01/15/2022 FINDINGS: Lungs are clear.  No pleural effusion or pneumothorax. The heart is normal in size. Degenerative changes of the visualized thoracolumbar spine. IMPRESSION: Normal chest radiographs. Electronically Signed   By: Charline Bills M.D.   On: 02/14/2024 03:52     PROCEDURES:  Critical Care performed: No  .1-3 Lead EKG Interpretation  Performed by: Irean Hong, MD Authorized by: Irean Hong, MD  Interpretation: normal     ECG rate:  85   ECG rate assessment: normal     Rhythm: sinus rhythm     Ectopy: none     Conduction: normal   Comments:     Patient placed on cardiac monitor to evaluate for arrhythmias    MEDICATIONS ORDERED IN ED: Medications  ketorolac (TORADOL) 30 MG/ML injection 15 mg (has no administration in  time range)  famotidine (PEPCID) IVPB 20 mg premix (has no administration in time range)     IMPRESSION / MDM / ASSESSMENT AND PLAN / ED COURSE  I reviewed the triage vital signs and the nursing notes.                             63 year old female presenting with chest pain. Differential diagnosis includes, but is not limited to, ACS, aortic dissection, pulmonary embolism, cardiac tamponade, pneumothorax, pneumonia, pericarditis, myocarditis, GI-related causes including esophagitis/gastritis, and musculoskeletal chest wall pain.   I personally reviewed patient's records and note a PCP office visit from 02/04/2024 for follow-up diabetes, hypertension, fibromyalgia, obesity, OSA.  Patient's presentation is most consistent with acute complicated illness / injury requiring diagnostic workup.  The patient is on the cardiac monitor to evaluate for evidence of arrhythmia and/or significant heart rate changes.  Will obtain cardiac panel to include LFTs/lipase, check D-dimer.  Administer IV ketorolac, Pepcid and reassess.  Clinical Course as of 02/25/24 [id number]  Wynelle Link Feb 14, 2024  1191 Patient feeling better after ketorolac.  Updated patient and daughter on all test results including 2 sets of negative troponins, negative D-dimer and clear chest x-ray.  Will discharge home with as needed NSAIDs and close follow-up with cardiology.  Strict return precautions given.  Both verbalized understanding and agree with plan of care. [JS]    Clinical Course User Index [JS] Irean Hong, MD     FINAL CLINICAL IMPRESSION(S) / ED DIAGNOSES   Final diagnoses:  Chest pain, unspecified type     Rx / DC Orders   ED Discharge Orders     None        Note:  This document was prepared using Dragon voice recognition software and may include unintentional dictation errors.   Irean Hong, MD 02/25/24 [phone]

## 2024-02-16 ENCOUNTER — Encounter: Payer: Self-pay | Admitting: Family Medicine

## 2024-02-18 ENCOUNTER — Encounter: Payer: 59 | Admitting: Family Medicine

## 2024-02-20 DIAGNOSIS — Z419 Encounter for procedure for purposes other than remedying health state, unspecified: Secondary | ICD-10-CM | POA: Diagnosis not present

## 2024-02-22 ENCOUNTER — Encounter: Payer: Self-pay | Admitting: Family Medicine

## 2024-02-22 ENCOUNTER — Other Ambulatory Visit (HOSPITAL_COMMUNITY)
Admission: RE | Admit: 2024-02-22 | Discharge: 2024-02-22 | Disposition: A | Discharge status: Home / Self Care | Source: Ambulatory Visit | Attending: Family Medicine | Admitting: Family Medicine

## 2024-02-22 ENCOUNTER — Ambulatory Visit (INDEPENDENT_AMBULATORY_CARE_PROVIDER_SITE_OTHER): Payer: 59 | Admitting: Family Medicine

## 2024-02-22 VITALS — BP 124/76 | HR 100 | Resp 16 | Ht 64.0 in | Wt 260.1 lb

## 2024-02-22 DIAGNOSIS — Z13820 Encounter for screening for osteoporosis: Secondary | ICD-10-CM

## 2024-02-22 DIAGNOSIS — E2839 Other primary ovarian failure: Secondary | ICD-10-CM | POA: Diagnosis not present

## 2024-02-22 DIAGNOSIS — Z124 Encounter for screening for malignant neoplasm of cervix: Secondary | ICD-10-CM

## 2024-02-22 DIAGNOSIS — Z1231 Encounter for screening mammogram for malignant neoplasm of breast: Secondary | ICD-10-CM

## 2024-02-22 DIAGNOSIS — Z0000 Encounter for general adult medical examination without abnormal findings: Secondary | ICD-10-CM | POA: Diagnosis not present

## 2024-02-22 NOTE — Patient Instructions (Signed)
 Preventive Care 16-61 Years Old, Female  Preventive care refers to lifestyle choices and visits with your health care provider that can promote health and wellness. Preventive care visits are also called wellness exams.  What can I expect for my preventive care visit?  Counseling  Your health care provider may ask you questions about your:  Medical history, including:  Past medical problems.  Family medical history.  Pregnancy history.  Current health, including:  Menstrual cycle.  Method of birth control.  Emotional well-being.  Home life and relationship well-being.  Sexual activity and sexual health.  Lifestyle, including:  Alcohol, nicotine or tobacco, and drug use.  Access to firearms.  Diet, exercise, and sleep habits.  Work and work Astronomer.  Sunscreen use.  Safety issues such as seatbelt and bike helmet use.  Physical exam  Your health care provider will check your:  Height and weight. These may be used to calculate your BMI (body mass index). BMI is a measurement that tells if you are at a healthy weight.  Waist circumference. This measures the distance around your waistline. This measurement also tells if you are at a healthy weight and may help predict your risk of certain diseases, such as type 2 diabetes and high blood pressure.  Heart rate and blood pressure.  Body temperature.  Skin for abnormal spots.  What immunizations do I need?    Vaccines are usually given at various ages, according to a schedule. Your health care provider will recommend vaccines for you based on your age, medical history, and lifestyle or other factors, such as travel or where you work.  What tests do I need?  Screening  Your health care provider may recommend screening tests for certain conditions. This may include:  Lipid and cholesterol levels.  Diabetes screening. This is done by checking your blood sugar (glucose) after you have not eaten for a while (fasting).  Pelvic exam and Pap test.  Hepatitis B test.  Hepatitis C  test.  HIV (human immunodeficiency virus) test.  STI (sexually transmitted infection) testing, if you are at risk.  Lung cancer screening.  Colorectal cancer screening.  Mammogram. Talk with your health care provider about when you should start having regular mammograms. This may depend on whether you have a family history of breast cancer.  BRCA-related cancer screening. This may be done if you have a family history of breast, ovarian, tubal, or peritoneal cancers.  Bone density scan. This is done to screen for osteoporosis.  Talk with your health care provider about your test results, treatment options, and if necessary, the need for more tests.  Follow these instructions at home:  Eating and drinking    Eat a diet that includes fresh fruits and vegetables, whole grains, lean protein, and low-fat dairy products.  Take vitamin and mineral supplements as recommended by your health care provider.  Do not drink alcohol if:  Your health care provider tells you not to drink.  You are pregnant, may be pregnant, or are planning to become pregnant.  If you drink alcohol:  Limit how much you have to 0-1 drink a day.  Know how much alcohol is in your drink. In the [place] one drink equals one 12 oz bottle of beer (355 mL), one 5 oz glass of wine (148 mL), or one 1 oz glass of hard liquor (44 mL).  Lifestyle  Brush your teeth every morning and night with fluoride toothpaste. Floss one time each day.  Exercise for at least  30 minutes 5 or more days each week.  Do not use any products that contain nicotine or tobacco. These products include cigarettes, chewing tobacco, and vaping devices, such as e-cigarettes. If you need help quitting, ask your health care provider.  Do not use drugs.  If you are sexually active, practice safe sex. Use a condom or other form of protection to prevent STIs.  If you do not wish to become pregnant, use a form of birth control. If you plan to become pregnant, see your health care provider for a  prepregnancy visit.  Take aspirin only as told by your health care provider. Make sure that you understand how much to take and what form to take. Work with your health care provider to find out whether it is safe and beneficial for you to take aspirin daily.  Find healthy ways to manage stress, such as:  Meditation, yoga, or listening to music.  Journaling.  Talking to a trusted person.  Spending time with friends and family.  Minimize exposure to UV radiation to reduce your risk of skin cancer.  Safety  Always wear your seat belt while driving or riding in a vehicle.  Do not drive:  If you have been drinking alcohol. Do not ride with someone who has been drinking.  When you are tired or distracted.  While texting.  If you have been using any mind-altering substances or drugs.  Wear a helmet and other protective equipment during sports activities.  If you have firearms in your house, make sure you follow all gun safety procedures.  Seek help if you have been physically or sexually abused.  What's next?  Visit your health care provider once a year for an annual wellness visit.  Ask your health care provider how often you should have your eyes and teeth checked.  Stay up to date on all vaccines.  This information is not intended to replace advice given to you by your health care provider. Make sure you discuss any questions you have with your health care provider.  Document Revised: 06/05/2021 Document Reviewed: 06/05/2021  Elsevier Patient Education  2024 [employer].

## 2024-02-22 NOTE — Progress Notes (Signed)
 Name: Lindsay Mcdonald   MRN: [id number]    DOB: 04/20/1961   Date:02/22/2024       Progress Note  Subjective  Chief Complaint  Chief Complaint  Patient presents with   Annual Exam    HPI  Patient presents for annual CPE.  Diet: not following a healthy diet at this time Exercise: discussed 150 minutes per week   Last Eye Exam: completed Last Dental Exam: she is going to have an appointment in July   Flowsheet Row Office Visit from 02/22/2024 in [facility]  AUDIT-C Score 0      Depression: Phq 9 is  negative    02/22/2024    3:05 PM 02/08/2024    2:53 PM 02/04/2024    8:55 AM 11/11/2023   10:02 AM 10/08/2023   11:36 AM  Depression screen PHQ 2/9  Decreased Interest 0  0 1 1  Down, Depressed, Hopeless 0  0 1 1  PHQ - 2 Score 0  0 2 2  Altered sleeping 0  0 1 1  Tired, decreased energy 0  0 1 0  Change in appetite 0  0 0 0  Feeling bad or failure about yourself  0  0 0 1  Trouble concentrating 0  0 0 0  Moving slowly or fidgety/restless 0  0 0 0  Suicidal thoughts 0  0 0 0  PHQ-9 Score 0  0 4 4  Difficult doing work/chores Not difficult at all  Not difficult at all  Not difficult at all     Information is confidential and restricted. Go to Review Flowsheets to unlock data.   Hypertension: BP Readings from Last 3 Encounters:  02/22/24 124/76  02/14/24 (!) 142/78  02/04/24 124/74   Obesity: Wt Readings from Last 3 Encounters:  02/22/24 260 lb 1.6 oz (118 kg)  02/14/24 256 lb (116.1 kg)  02/04/24 251 lb (113.9 kg)   BMI Readings from Last 3 Encounters:  02/22/24 44.65 kg/m  02/14/24 43.94 kg/m  02/04/24 43.08 kg/m     Vaccines: reviewed with the patient.   Hep C Screening: completed STD testing and prevention (HIV/chl/gon/syphilis): not sexually active Intimate partner violence: negative screen  Sexual History : no sexually active in over 20 years Menstrual History/LMP/Abnormal Bleeding: post-menopausal  Discussed importance of  follow up if any post-menopausal bleeding: yes  Incontinence Symptoms: positive for symptoms , but mild , urgency type and when she holds for a long time  Breast cancer:  - Last Mammogram: she will schedule it  - BRCA gene screening: mother and maternal aunt had breast cancer - patient was tested and had a negative BRCA done by gyn   Osteoporosis Prevention : Discussed high calcium and vitamin D supplementation, weight bearing exercises Bone density :yes   Cervical cancer screening: performing today  Skin cancer: Discussed monitoring for atypical lesions  Colorectal cancer: up to date repeat in 2029   Lung cancer:  Low Dose CT Chest recommended if Age 34-80 years, 20 pack-year currently smoking OR have quit w/in 15years. Patient does not qualify for screen   ECG: 02/15/2024  Advanced Care Planning: A voluntary discussion about advance care planning including the explanation and discussion of advance directives.  Discussed health care proxy and Living will, and the patient was able to identify a health care proxy as daughter .  Patient does not have a living will and power of attorney of health care   Patient Active Problem List   Diagnosis  Date Noted   Current mild episode of major depressive disorder without prior episode (HCC) 02/08/2024   Anxiety disorder 02/08/2024   Herniated intervertebral disc of lumbar spine 08/07/2023   B12 deficiency 05/15/2023   Occult blood positive stool 02/03/2023   Adenomatous polyp of colon 02/03/2023   Dyslipidemia associated with type 2 diabetes mellitus (HCC) 05/21/2021   Hypertension associated with diabetes (HCC) 08/31/2020   History of marijuana use 03/30/2019   Fibromyalgia 12/31/2018   Venous vascular malformations 03/27/2016   Seasonal allergic rhinitis 09/26/2015   Muscle spasms of neck 06/19/2015   Insomnia 06/19/2015   Primary osteoarthritis of right knee 06/17/2015   Depression, major, recurrent, mild (HCC) 06/17/2015   Hyperlipidemia  06/17/2015   Benign hypertension 06/17/2015   Morbid obesity with BMI of 40.0-44.9, adult (HCC) 06/17/2015   Vitamin D deficiency 06/17/2015    Past Surgical History:  Procedure Laterality Date   BREAST REDUCTION SURGERY     COLONOSCOPY WITH PROPOFOL N/A 02/03/2023   Procedure: COLONOSCOPY WITH PROPOFOL;  Surgeon: Wyline Mood, MD;  Location: [facility] ENDOSCOPY;  Service: Gastroenterology;  Laterality: N/A;   ETHMOIDECTOMY Right 05/16/2020   Procedure: ETHMOIDECTOMY;  Surgeon: Vernie Murders, MD;  Location: [facility] ORS;  Service: ENT;  Laterality: Right;   EXCISION ORAL TUMOR N/A 05/16/2020   Procedure: EXCISION OF SOFT PALATE PAPILLOMA;  Surgeon: Vernie Murders, MD;  Location: [facility] ORS;  Service: ENT;  Laterality: N/A;   FRONTAL SINUS EXPLORATION Right 05/16/2020   Procedure: FRONTAL SINUS EXPLORATION;  Surgeon: Vernie Murders, MD;  Location: [facility] ORS;  Service: ENT;  Laterality: Right;   IMAGE GUIDED SINUS SURGERY N/A 05/16/2020   Procedure: IMAGE GUIDED SINUS SURGERY;  Surgeon: Vernie Murders, MD;  Location: [facility] ORS;  Service: ENT;  Laterality: N/A;   MAXILLARY ANTROSTOMY Bilateral 05/16/2020   Procedure: MAXILLARY ANTROSTOMY with tissue removal;  Surgeon: Vernie Murders, MD;  Location: [facility] ORS;  Service: ENT;  Laterality: Bilateral;   nasal endoscopic     OOPHORECTOMY  ?   one ovary removed    REDUCTION MAMMAPLASTY Bilateral 2000   removal of ovary     SPHENOIDECTOMY Right 05/16/2020   Procedure: SPHENOIDECTOMY;  Surgeon: Vernie Murders, MD;  Location: [facility] ORS;  Service: ENT;  Laterality: Right;    Family History  Problem Relation Age of Onset   Dementia Mother    Diabetes Mother    Cancer Mother 35       Breast   Breast cancer Mother 74   Gout Mother    Diabetes Father    Breast cancer Maternal Aunt 105   Kidney cancer Maternal Aunt    Kidney disease Maternal Aunt    Stroke Maternal Uncle    Alcohol abuse Daughter    Diabetes Daughter        Oldest Daughter   Alcohol abuse Daughter     Cancer Half-Sister 60       breast cancer   Prostate cancer Neg Hx    Bladder Cancer Neg Hx     Social History   Socioeconomic History   Marital status: Divorced    Spouse name: Not on file   Number of children: 2   Years of education: Not on file   Highest education level: Some college, no degree  Occupational History   Occupation: Engineer, mining   Tobacco Use   Smoking status: Never   Smokeless tobacco: Never  Vaping Use   Vaping status: Former  Substance and Sexual Activity   Alcohol use: No  Alcohol/week: 0.0 standard drinks of alcohol   Drug use: No   Sexual activity: Not Currently    Partners: Male    Birth control/protection: None  Other Topics Concern   Not on file  Social History Narrative   Divorced and he died since the divorce   Working at Patent attorney.    Social Drivers of Health   Financial Resource Strain: Medium Risk (02/22/2024)   Overall Financial Resource Strain (CARDIA)    Difficulty of Paying Living Expenses: Somewhat hard  Food Insecurity: No Food Insecurity (02/22/2024)   Hunger Vital Sign    Worried About Running Out of Food in the Last Year: Never true    Ran Out of Food in the Last Year: Never true  Transportation Needs: No Transportation Needs (02/22/2024)   PRAPARE - Administrator, Civil Service (Medical): No    Lack of Transportation (Non-Medical): No  Physical Activity: Insufficiently Active (02/22/2024)   Exercise Vital Sign    Days of Exercise per Week: 3 days    Minutes of Exercise per Session: 10 min  Stress: No Stress Concern Present (02/22/2024)   [employer] of Occupational Health - Occupational Stress Questionnaire    Feeling of Stress : Only a little  Social Connections: Moderately Isolated (02/22/2024)   Social Connection and Isolation Panel [NHANES]    Frequency of Communication with Friends and Family: Twice a week    Frequency of Social Gatherings with Friends and Family: Twice a week    Attends  Religious Services: 1 to 4 times per year    Active Member of [employer] or Organizations: No    Attends Banker Meetings: Never    Marital Status: Divorced  Catering manager Violence: Not At Risk (02/22/2024)   Humiliation, Afraid, Rape, and Kick questionnaire    Fear of Current or Ex-Partner: No    Emotionally Abused: No    Physically Abused: No    Sexually Abused: No     Current Outpatient Medications:    ACCU-CHEK GUIDE TEST test strip, USE THREE TIMES DAILY TO TEST BLOOD SUGAR. MORNING, NOON AND BEDTIME., Disp: 100 strip, Rfl: 0   Accu-Chek Softclix Lancets lancets, USE TO TEST BLOOD SUGAR THREE TIMES DAILY. MORNING, NOON AND BEDTIME., Disp: 100 each, Rfl: 0   aspirin EC 81 MG tablet, Take 81 mg by mouth daily. Swallow whole., Disp: , Rfl:    baclofen (LIORESAL) 10 MG tablet, Take 10 mg by mouth at bedtime., Disp: , Rfl:    Cholecalciferol (VITAMIN D) 50 MCG (2000 UT) CAPS, Take 1 each by mouth daily at 12 noon., Disp: , Rfl:    fluticasone (FLONASE) 50 MCG/ACT nasal spray, Place 2 sprays into both nostrils daily as needed for allergies or rhinitis., Disp: , Rfl:    GVOKE HYPOPEN 2-PACK 1 MG/0.2ML SOAJ, Inject 1 mg into the skin as needed (for hypoglycemia episodes (glucose below 60 and symptomatic))., Disp: 0.4 mL, Rfl: 1   hydrOXYzine (ATARAX) 10 MG tablet, Take 1 tablet (10 mg total) by mouth 3 (three) times daily as needed for anxiety., Disp: 90 tablet, Rfl: 1   insulin glargine, 1 Unit Dial, (TOUJEO SOLOSTAR) 300 UNIT/ML Solostar Pen, Inject 40-50 Units into the skin daily at 12 noon., Disp: 4.5 mL, Rfl: 0   Insulin Pen Needle 32G X 6 MM MISC, 1 each by Does not apply route daily at 2 PM., Disp: 100 each, Rfl: 1   meclizine (ANTIVERT) 25 MG tablet, , Disp: , Rfl:  Melatonin 10 MG TABS, Take 10 mg by mouth at bedtime as needed (sleep). , Disp: , Rfl:    metFORMIN (GLUCOPHAGE-XR) 750 MG 24 hr tablet, Take 1 tablet (750 mg total) by mouth daily with breakfast., Disp: 90  tablet, Rfl: 0   naproxen (NAPROSYN) 500 MG tablet, Take 1 tablet (500 mg total) by mouth 2 (two) times daily as needed., Disp: 14 tablet, Rfl: 0   olmesartan (BENICAR) 40 MG tablet, Take 1 tablet (40 mg total) by mouth daily., Disp: 90 tablet, Rfl: 0   omega-3 acid ethyl esters (LOVAZA) 1 g capsule, Take 2 capsules (2 g total) by mouth 2 (two) times daily., Disp: 360 capsule, Rfl: 1   tirzepatide (MOUNJARO) 7.5 MG/0.5ML Pen, Inject 7.5 mg into the skin once a week., Disp: 6 mL, Rfl: 0   Vilazodone HCl 20 MG TABS, Take 1 tablet (20 mg total) by mouth in the morning. Please start taking after stopping the 10 mg ., Disp: 30 tablet, Rfl: 1   Vilazodone HCl (VIIBRYD) 10 MG TABS, Take 1 tablet (10 mg total) by mouth daily for 7 days. Stop after taking for a week ., Disp: 7 tablet, Rfl: 0  Allergies  Allergen Reactions   Simvastatin     Muscle cramps    Trazodone Hcl     It may have caused sob    Zetia [Ezetimibe] Other (See Comments)    Myalgia    Atorvastatin Other (See Comments)    muscle cramps   Compazine Other (See Comments)    Twitching and spasms of neck muscles   Penicillins Rash    Reaction under 25, patient unsure that she still has an allergy     ROS  Constitutional: Negative for fever or weight change.  Respiratory: Negative for cough and shortness of breath.   Cardiovascular: Negative for chest pain or palpitations.  Gastrointestinal: Negative for abdominal pain, no bowel changes.  Musculoskeletal: Negative for gait problem or joint swelling.  Skin: Negative for rash.  Neurological: Negative for dizziness or headache.  No other specific complaints in a complete review of systems (except as listed in HPI above).   Objective  Vitals:   02/22/24 1509  BP: 124/76  Pulse: 100  Resp: 16  SpO2: 98%  Weight: 260 lb 1.6 oz (118 kg)  Height: 5\' 4"  (1.626 m)    Body mass index is 44.65 kg/m.  Physical Exam  Constitutional: Patient appears well-developed and  well-nourished. No distress.  HENT: Head: Normocephalic and atraumatic. Ears: B TMs ok, no erythema or effusion; Nose: Nose normal. Mouth/Throat: Oropharynx is clear and moist. No oropharyngeal exudate.  Eyes: Conjunctivae and EOM are normal. Pupils are equal, round, and reactive to light. No scleral icterus.  Neck: Normal range of motion. Neck supple. No JVD present. No thyromegaly present.  Cardiovascular: Normal rate, regular rhythm and normal heart sounds.  No murmur heard. No BLE edema. Pulmonary/Chest: Effort normal and breath sounds normal. No respiratory distress. Abdominal: Soft. Bowel sounds are normal, no distension. There is no tenderness. no masses Breast: no lumps or masses, no nipple discharge or rashes FEMALE GENITALIA:  External genitalia normal External urethra normal Vaginal atrophy Cervix normal without discharge or lesions Bimanual exam normal without masses RECTAL: not done  Musculoskeletal: Normal range of motion, no joint effusions. No gross deformities Neurological: he is alert and oriented to person, place, and time. No cranial nerve deficit. Coordination, balance, strength, speech and gait are normal.  Skin: Skin is warm and dry.  No rash noted. No erythema.  Psychiatric: Patient has a normal mood and affect. behavior is normal. Judgment and thought content normal.     Assessment & Plan  1. Cervical cancer screening  - Cytology - PAP  2. Well adult exam (Primary)  - MM 3D SCREENING MAMMOGRAM BILATERAL BREAST; Future - DG Bone Density; Future  3. Breast cancer screening by mammogram  - MM 3D SCREENING MAMMOGRAM BILATERAL BREAST; Future  4. Ovarian failure  - DG Bone Density; Future  5. Osteoporosis screening  - DG Bone Density; Future   -USPSTF grade A and B recommendations reviewed with patient; age-appropriate recommendations, preventive care, screening tests, etc discussed and encouraged; healthy living encouraged; see AVS for patient education  given to patient -Discussed importance of 150 minutes of physical activity weekly, eat two servings of fish weekly, eat one serving of tree nuts ( cashews, pistachios, pecans, almonds.Marland Kitchen) every other day, eat 6 servings of fruit/vegetables daily and drink plenty of water and avoid sweet beverages.   -Reviewed Health Maintenance: Yes.

## 2024-02-23 ENCOUNTER — Other Ambulatory Visit: Payer: Self-pay | Admitting: Family Medicine

## 2024-02-23 MED ORDER — TIRZEPATIDE 10 MG/0.5ML ~~LOC~~ SOAJ: 10.0000 mg | SUBCUTANEOUS | 0 refills | Status: DC

## 2024-02-25 ENCOUNTER — Encounter: Payer: Self-pay | Admitting: Family Medicine

## 2024-02-25 LAB — CYTOLOGY - PAP
Comment: NEGATIVE
Diagnosis: NEGATIVE
High risk HPV: NEGATIVE

## 2024-03-01 ENCOUNTER — Encounter: Payer: Self-pay | Admitting: Family Medicine

## 2024-03-01 DIAGNOSIS — R232 Flushing: Secondary | ICD-10-CM | POA: Diagnosis not present

## 2024-03-09 DIAGNOSIS — M5126 Other intervertebral disc displacement, lumbar region: Secondary | ICD-10-CM | POA: Diagnosis not present

## 2024-03-09 DIAGNOSIS — M5416 Radiculopathy, lumbar region: Secondary | ICD-10-CM | POA: Diagnosis not present

## 2024-03-09 DIAGNOSIS — M1611 Unilateral primary osteoarthritis, right hip: Secondary | ICD-10-CM | POA: Diagnosis not present

## 2024-03-16 ENCOUNTER — Ambulatory Visit

## 2024-03-18 DIAGNOSIS — M1611 Unilateral primary osteoarthritis, right hip: Secondary | ICD-10-CM | POA: Diagnosis not present

## 2024-03-23 ENCOUNTER — Ambulatory Visit

## 2024-03-23 ENCOUNTER — Encounter

## 2024-03-23 DIAGNOSIS — F320 Major depressive disorder, single episode, mild: Secondary | ICD-10-CM

## 2024-03-23 DIAGNOSIS — F419 Anxiety disorder, unspecified: Secondary | ICD-10-CM

## 2024-03-28 MED ORDER — HYDROXYZINE PAMOATE 25 MG PO CAPS: 25.0000 mg | ORAL_CAPSULE | Freq: Every day | ORAL | 1 refills | Status: DC | PRN

## 2024-03-28 NOTE — Addendum Note (Signed)
 Addended byJomarie Longs on: 03/28/2024 02:48 PM   Modules accepted: Orders

## 2024-03-29 ENCOUNTER — Ambulatory Visit

## 2024-03-30 ENCOUNTER — Encounter

## 2024-03-31 ENCOUNTER — Encounter

## 2024-03-31 ENCOUNTER — Encounter: Payer: Self-pay | Admitting: Family Medicine

## 2024-04-02 DIAGNOSIS — Z419 Encounter for procedure for purposes other than remedying health state, unspecified: Secondary | ICD-10-CM | POA: Diagnosis not present

## 2024-04-05 ENCOUNTER — Ambulatory Visit

## 2024-04-05 ENCOUNTER — Encounter

## 2024-04-05 ENCOUNTER — Ambulatory Visit: Attending: Family Medicine

## 2024-04-05 DIAGNOSIS — M6281 Muscle weakness (generalized): Secondary | ICD-10-CM | POA: Insufficient documentation

## 2024-04-05 DIAGNOSIS — M5459 Other low back pain: Secondary | ICD-10-CM | POA: Insufficient documentation

## 2024-04-05 NOTE — Therapy (Signed)
 OUTPATIENT PHYSICAL THERAPY THORACOLUMBAR EVALUATION   Patient Name: Lindsay Mcdonald MRN: [id number] DOB:05-02-1961, 63 y.o., female Today's Date: 04/05/2024  END OF SESSION:  PT End of Session - 04/05/24 0817     Visit Number 1    Number of Visits 25    Date for PT Re-Evaluation 06/28/24    Authorization Type [employer] & [employer]   VL 90 PT/OT/ST    PT Start Time 0816    PT Stop Time 0900    PT Time Calculation (min) 44 min    Activity Tolerance Patient tolerated treatment well    Behavior During Therapy [facility] for tasks assessed/performed             Past Medical History:  Diagnosis Date   Anxiety    Arthritis 03/2020   osteoarthritis both knees   Chronic pain of right knee    Chronic sinusitis    DDD (degenerative disc disease), cervical    cervical radiculopathy   Depression    Diabetes mellitus    insulin dependant   Fibromyalgia    GERD (gastroesophageal reflux disease)    High cholesterol    Hyperlipidemia    Hypertension    Insomnia    Muscle pain    Nasal polyp    Neuromuscular disorder (HCC)    diabetic neuropathy   Obesity    Palpitations    Tendonitis of knee, left 04/2020   received steroid injection   Vitamin D deficiency    Vitamin D deficiency    Past Surgical History:  Procedure Laterality Date   BREAST REDUCTION SURGERY     COLONOSCOPY WITH PROPOFOL N/A 02/03/2023   Procedure: COLONOSCOPY WITH PROPOFOL;  Surgeon: Wyline Mood, MD;  Location: [facility] END[facility]COPY;  Service: Gastroenterology;  Laterality: N/A;   ETHMOIDECTOMY Right 05/16/2020   Procedure: ETHMOIDECTOMY;  Surgeon: Vernie Murders, MD;  Location: [facility] ORS;  Service: ENT;  Laterality: Right;   EXCISION ORAL TUMOR N/A 05/16/2020   Procedure: EXCISION OF SOFT PALATE PAPILLOMA;  Surgeon: Vernie Murders, MD;  Location: [facility] ORS;  Service: ENT;  Laterality: N/A;   FRONTAL SINUS EXPLORATION Right 05/16/2020   Procedure: FRONTAL SINUS EXPLORATION;  Surgeon: Vernie Murders, MD;  Location: [facility] ORS;   Service: ENT;  Laterality: Right;   IMAGE GUIDED SINUS SURGERY N/A 05/16/2020   Procedure: IMAGE GUIDED SINUS SURGERY;  Surgeon: Vernie Murders, MD;  Location: [facility] ORS;  Service: ENT;  Laterality: N/A;   MAXILLARY ANTROSTOMY Bilateral 05/16/2020   Procedure: MAXILLARY ANTROSTOMY with tissue removal;  Surgeon: Vernie Murders, MD;  Location: [facility] ORS;  Service: ENT;  Laterality: Bilateral;   nasal endoscopic     OOPHORECTOMY  ?   one ovary removed    REDUCTION MAMMAPLASTY Bilateral 2000   removal of ovary     SPHENOIDECTOMY Right 05/16/2020   Procedure: SPHENOIDECTOMY;  Surgeon: Vernie Murders, MD;  Location: [facility] ORS;  Service: ENT;  Laterality: Right;   Patient Active Problem List   Diagnosis Date Noted   Current mild episode of major depressive disorder without prior episode (HCC) 02/08/2024   Anxiety disorder 02/08/2024   Herniated intervertebral disc of lumbar spine 08/07/2023   B12 deficiency 05/15/2023   Occult blood positive stool 02/03/2023   Adenomatous polyp of colon 02/03/2023   Dyslipidemia associated with type 2 diabetes mellitus (HCC) 05/21/2021   Hypertension associated with diabetes (HCC) 08/31/2020   History of marijuana use 03/30/2019   Fibromyalgia 12/31/2018   Venous vascular malformations 03/27/2016   Seasonal allergic rhinitis 09/26/2015  Muscle spasms of neck 06/19/2015   Insomnia 06/19/2015   Primary osteoarthritis of right knee 06/17/2015   Depression, major, recurrent, mild (HCC) 06/17/2015   Hyperlipidemia 06/17/2015   Benign hypertension 06/17/2015   Morbid obesity with BMI of 40.0-44.9, adult (HCC) 06/17/2015   Vitamin D deficiency 06/17/2015    PCP: Alba Cory, MD  REFERRING PROVIDER: Meeler, Jodelle Gross, FNP  REFERRING DIAG:  M51.26 (ICD-10-CM) - Herniated nucleus pulposus, L4-5  M54.16 (ICD-10-CM) - Lumbar radiculopathy    RATIONALE FOR EVALUATION AND TREATMENT: Rehabilitation  THERAPY DIAG: Other low back pain  Muscle weakness  (generalized)  ONSET DATE: Chronic (10 yrs)  FOLLOW-UP APPT SCHEDULED WITH REFERRING PROVIDER: Yes    SUBJECTIVE:                                                                                                                                                                                         SUBJECTIVE STATEMENT:  Low Back Pain   PERTINENT HISTORY:   Pt arrives to OPPT with a report of pain in in the lower back and radiates into the R thigh. She describes the location as bilateral back pain but the pain is worse on the R side. Pain is aggravated with R sidelying, prolonged standing/walking and transitioning in/out of the car. Pain is relieved with a rest and recent prescription medications (tramadol, baclofen). Additionally, pt has also received ESI in the L Lumbar spine and R hip (~100% relief in the L spine). Patient states that she will get muscle spasms with a specific foot positions but its managed with baclofen. Her occupation demands prolonged seated position (~8hr). She describes n/t in her lower back with radiating pain in her posterior thigh. Pt denies abdominal pain, nausea, vomiting, unrelenting pain.   PAIN:    Pain Intensity: Present: 1/10, Best: 1/10, Worst: 10/10 Pain Quality: intermittent  Radiating: Yes  Numbness/Tingling: Yes Back Focal Weakness: Yes RLE  Relieving factors: Injections, Tramadol, Aleve  How long can you sit: 30 min - 1 hr How long can you stand: 10 min Dominant hand: right Red flags: Negative for Saddle Anaesthesia,  h/o abdominal aneurysm, abdominal pain, chills/fever, night sweats, nausea, vomiting, unrelenting pain  PRECAUTIONS: Fall  WEIGHT BEARING RESTRICTIONS: No  FALLS: Has patient fallen in last 6 months? 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  Recent memory is intact.  Remote memory is intact.  Attention span and concentration are intact.  Expressive speech is intact.  Patient's fund of knowledge is within normal limits for educational level.    Gross Musculoskeletal Assessment Tremor: None Bulk: Normal Tone: Normal  GAIT: Distance walked: 31m Assistive device utilized: None Level of assistance: Complete Independence Comments: Femoral Adduction closer to midline with swing phase.   Posture: Lumbar lordosis: Not assessed Iliac crest height: Equal bilaterally Lumbar lateral shift: not assessed  AROM AROM (Normal range in degrees) AROM   Lumbar   Flexion (65) 100%  Extension (30) 75% R*   Right lateral flexion (25) 100%  Left lateral flexion (25) 100%  Right rotation (30) 100%  Left rotation (30) 100%      Hip Right Left  Flexion (125) 100 100  Extension (15)    Abduction (40)    Adduction     Internal Rotation (45) 20 WNL  External Rotation (45)        Knee    Flexion (135) WNL  WNL  Extension (0) WNL WNL      Ankle    Dorsiflexion (20)    Plantarflexion (50)    Inversion (35)    Eversion (15)    (* = pain; Blank rows = not tested)  LE MMT: MMT (out of 5) Right  Left   Hip flexion 4- 4  Hip extension    Hip abduction 4   Hip adduction    Hip internal rotation    Hip external rotation    Knee flexion 5 5  Knee extension 4 5  Ankle dorsiflexion 5 5  Ankle plantarflexion 5 5  Ankle inversion    Ankle eversion    (* = pain; Blank rows = not tested)  Sensation Grossly intact to light touch throughout bilateral LEs as determined by testing dermatomes L2-S2. Proprioception, stereognosis, and hot/cold testing deferred on this  date.  Reflexes Not Examined    Muscle Length Hamstrings: R: Negative L: Negative  Palpation Location Right Left         Lumbar paraspinals 1   Quadratus Lumborum 1   Gluteus Maximus 1   Gluteus Medius 1   Deep hip external rotators 1   PSIS    Fortin's Area (SIJ)    Greater Trochanter    (Blank rows = not tested) Graded on 0-4 scale (0 = no pain, 1 = pain, 2 = pain with wincing/grimacing/flinching, 3 = pain with withdrawal, 4 = unwilling to allow palpation)  Passive Accessory Intervertebral Motion Pt denies reproduction of back pain with CPA L1-L5 and UPA bilaterally L1-L5. Generally, hypomobile throughout  Special Tests Lumbar Radiculopathy and Discogenic: Centralization and Peripheralization (SN 92, -LR 0.12): Not examined Slump (SN 83, -LR 0.32): Not Examined SLR (SN 92, -LR 0.29): R: Negative L:  Negative Crossed SLR (SP 90): R: Negative L: Negative  Hip: FABER (SN 81): R: Positive L: Negative FADIR (SN 94): R: Negative L: Negative  Piriformis Syndrome: FAIR Test (SN 88, SP 83): R: Negative L: Negative  Functional Tasks Lifting: Not examined Deep squat: Not examined  Sit to stand: Slowed,  Lateral Step-Down Test: Not examined  TODAY'S TREATMENT: DATE: 04/05/2024  PT demo with pt return demonstration on initial HEP:   Exercises Supine Single Knee to Chest Stretch: 2 x 15s ea leg   PT applied OP with R  LE with second set  Supine Hamstring Stretch with Strap (RLE): 2 x 30s   Clamshell with Resistance   2 x 10 with Red TB   Prone Press Up On Elbows  - 1 x daily - 7 x weekly - 3 sets - 10 reps    PT applied OP at lower lumbar in order to create fulcrum    PATIENT EDUCATION:  Education details: HEP, POC,  Person educated: Patient Education method: Explanation, Demonstration, Tactile cues, Verbal cues, and Handouts Education comprehension: verbalized understanding and returned demonstration   HOME EXERCISE PROGRAM:   Access Code: [email or web address] URL:  [email or web address] Date: 04/05/2024 Prepared by: [name] Gottron Hermon Zea  Exercises - Supine Single Knee to Chest Stretch  - 1 x daily - 7 x weekly - 3 sets - 30 hold - Supine Hamstring Stretch with Strap  - 1 x daily - 7 x weekly - 3 sets - 30 hold - Prone Press Up On Elbows  - 1 x daily - 7 x weekly - 2-3 sets - 10-12 reps - 5s Hold - Clamshell with Resistance  - 1 x daily - 7 x weekly - 2-3 sets - 10-12 reps   ASSESSMENT:  CLINICAL IMPRESSION: Patient is a 63 y.o. female who was seen today for physical therapy evaluation and treatment for lower back pain with radicular symptoms. Patient presents with reduced standing lumbar extension with concordant symptoms but able to perform prone press up with reduced pain. Pt with decreased functional LE strength as indicated with 5TSTS and (14.25s and .85 m/s respectively). FABER testing positive for concordant pain and tightness in R gluteal muscles. Palpation with concordant pain along deep gluteal, piriformis, lumbar paraspinals. Hamstring testing WNL with minor pain in R lumbar spine. PT educated patient on importance of hip strengthening and core stabilization while pain is reduced with ESI. Provided initial HEP focused on relieving tension with good return demonstration from patient. Next session to include to initial strengthening program for hip. Currently household and community based activities limited due to pain and strength deficits. Based on today's performance, current POC remains appropriate and pt will continue to benefit from skilled physical therapy focused on improving gluteal strength, core stabilization and maximizing return to PLOF.   OBJECTIVE IMPAIRMENTS: Abnormal gait, decreased endurance, decreased mobility, difficulty walking, decreased strength, increased muscle spasms, and pain.   ACTIVITY LIMITATIONS: lifting, sitting, standing, squatting, sleeping, and stairs  PARTICIPATION LIMITATIONS: driving, community  activity, and occupation  PERSONAL FACTORS: Age, Profession, and 3+ comorbidities: HTN, Fibromyalgia, diabetic neuropathy  are also affecting patient's functional outcome.   REHAB POTENTIAL: Good  CLINICAL DECISION MAKING: Evolving/moderate complexity  EVALUATION COMPLEXITY: Moderate   GOALS: Goals reviewed with patient? No  SHORT TERM GOALS: Target date: 05/17/2024  Pt will be independent with HEP in order to improve strength and decrease back pain to improve pain-free function at home and work. Baseline: 04/05/2024: initial HEP provided Goal status: INITIAL   LONG TERM GOALS: Target date: 06/28/2024  Pt will report sitting or standing for more than 30 min to 1 hour in with minimal to no pain (< 3/10 on NPS) in order to demonstrate clinically significant reduction in back pain and improvements with work related demands.  Baseline: n/a Goal status: INITIAL  2.  Pt will decrease worst back pain by at least 2 points on the NPRS in order to demonstrate clinically significant reduction in back pain. Baseline: 04/05/2024: 10/10 Goal status: INITIAL  3.  Pt will decrease mODI score by at  least 13 points in order demonstrate clinically significant reduction in back pain/disability.       Baseline: Deferred to next session Goal status: INITIAL  4.  Pt will decrease 5TSTS by at least 3 seconds in order to demonstrate clinically significant improvement in LE strength  Baseline: 04/05/2024: 14.25s Goal status: INITIAL  5.  Pt will increase by at least 0.13 m/s in order to demonstrate clinically significant improvement in community ambulation.   Baseline: 04/05/2024: .85 m/s  Goal status: INITIAL   PLAN: PT FREQUENCY: 1-2x/week  PT DURATION: 12 weeks  PLANNED INTERVENTIONS: Therapeutic exercises, Therapeutic activity, Neuromuscular re-education, Balance training, Gait training, Patient/Family education, Self Care, Joint mobilization, Joint manipulation, Vestibular training,  Canalith repositioning, Orthotic/Fit training, DME instructions, Dry Needling, Electrical stimulation, Spinal manipulation, Spinal mobilization, Cryotherapy, Moist heat, Taping, Traction, Ultrasound, Ionotophoresis 4mg /ml Dexamethasone, Manual therapy, and Re-evaluation.  PLAN FOR NEXT SESSION: Finish mODI, Initial R hip strengthening, Extension based exercises for Lumbar   Satira Curet PT, DPT Physical Therapist- [facility]  [facility]  04/05/2024, 6:37 PM

## 2024-04-06 ENCOUNTER — Encounter

## 2024-04-06 ENCOUNTER — Ambulatory Visit (INDEPENDENT_AMBULATORY_CARE_PROVIDER_SITE_OTHER): Payer: Self-pay | Admitting: Professional Counselor

## 2024-04-06 DIAGNOSIS — M549 Dorsalgia, unspecified: Secondary | ICD-10-CM | POA: Diagnosis not present

## 2024-04-06 DIAGNOSIS — M62838 Other muscle spasm: Secondary | ICD-10-CM | POA: Diagnosis not present

## 2024-04-06 DIAGNOSIS — R202 Paresthesia of skin: Secondary | ICD-10-CM | POA: Diagnosis not present

## 2024-04-06 DIAGNOSIS — F331 Major depressive disorder, recurrent, moderate: Secondary | ICD-10-CM

## 2024-04-06 DIAGNOSIS — F419 Anxiety disorder, unspecified: Secondary | ICD-10-CM | POA: Diagnosis not present

## 2024-04-06 DIAGNOSIS — M5416 Radiculopathy, lumbar region: Secondary | ICD-10-CM | POA: Diagnosis not present

## 2024-04-06 DIAGNOSIS — M5430 Sciatica, unspecified side: Secondary | ICD-10-CM | POA: Diagnosis not present

## 2024-04-06 DIAGNOSIS — M5126 Other intervertebral disc displacement, lumbar region: Secondary | ICD-10-CM | POA: Diagnosis not present

## 2024-04-06 DIAGNOSIS — R252 Cramp and spasm: Secondary | ICD-10-CM | POA: Diagnosis not present

## 2024-04-06 DIAGNOSIS — M545 Low back pain, unspecified: Secondary | ICD-10-CM | POA: Diagnosis not present

## 2024-04-06 DIAGNOSIS — R200 Anesthesia of skin: Secondary | ICD-10-CM | POA: Diagnosis not present

## 2024-04-06 DIAGNOSIS — M1611 Unilateral primary osteoarthritis, right hip: Secondary | ICD-10-CM | POA: Diagnosis not present
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
## 2024-04-07 ENCOUNTER — Encounter

## 2024-04-08 ENCOUNTER — Ambulatory Visit: Admitting: Cardiology

## 2024-04-12 ENCOUNTER — Ambulatory Visit: Admitting: Family Medicine

## 2024-04-12 ENCOUNTER — Encounter

## 2024-04-13 ENCOUNTER — Ambulatory Visit (INDEPENDENT_AMBULATORY_CARE_PROVIDER_SITE_OTHER): Admitting: Family Medicine

## 2024-04-13 ENCOUNTER — Encounter

## 2024-04-13 ENCOUNTER — Encounter: Payer: Self-pay | Admitting: Psychiatry

## 2024-04-13 ENCOUNTER — Encounter: Payer: Self-pay | Admitting: Family Medicine

## 2024-04-13 ENCOUNTER — Telehealth (INDEPENDENT_AMBULATORY_CARE_PROVIDER_SITE_OTHER): Payer: Self-pay | Admitting: Psychiatry

## 2024-04-13 ENCOUNTER — Ambulatory Visit

## 2024-04-13 VITALS — BP 126/76 | HR 93 | Resp 16 | Ht 64.0 in | Wt 250.7 lb

## 2024-04-13 DIAGNOSIS — E1159 Type 2 diabetes mellitus with other circulatory complications: Secondary | ICD-10-CM

## 2024-04-13 DIAGNOSIS — M5126 Other intervertebral disc displacement, lumbar region: Secondary | ICD-10-CM | POA: Diagnosis not present

## 2024-04-13 DIAGNOSIS — F331 Major depressive disorder, recurrent, moderate: Secondary | ICD-10-CM | POA: Diagnosis not present

## 2024-04-13 DIAGNOSIS — I152 Hypertension secondary to endocrine disorders: Secondary | ICD-10-CM | POA: Diagnosis not present

## 2024-04-13 DIAGNOSIS — E1169 Type 2 diabetes mellitus with other specified complication: Secondary | ICD-10-CM | POA: Diagnosis not present

## 2024-04-13 DIAGNOSIS — F411 Generalized anxiety disorder: Secondary | ICD-10-CM

## 2024-04-13 DIAGNOSIS — E669 Obesity, unspecified: Secondary | ICD-10-CM | POA: Diagnosis not present

## 2024-04-13 NOTE — Progress Notes (Unsigned)
 Virtual Visit via Video Note  I connected with Lindsay Mcdonald on 04/13/24 at  3:00 PM EDT by a video enabled telemedicine application and verified that I am speaking with the correct person using two identifiers.  Location Provider Location : [facility] Patient Location : Work  Participants: Patient , Provider    I discussed the limitations of evaluation and management by telemedicine and the availability of in person appointments. The patient expressed understanding and agreed to proceed.   I discussed the assessment and treatment plan with the patient. The patient was provided an opportunity to ask questions and all were answered. The patient agreed with the plan and demonstrated an understanding of the instructions.   The patient was advised to call back or seek an in-person evaluation if the symptoms worsen or if the condition fails to improve as anticipated.  BH MD OP Progress Note  04/14/2024 9:46 AM Lindsay Mcdonald  MRN:  [id number]  Chief Complaint:  Chief Complaint  Patient presents with   Follow-up   Anxiety   Depression   Medication Refill   Discussed the use of AI scribe software for clinical note transcription with the patient, who gave verbal consent to proceed.  History of Present Illness Lindsay Mcdonald is a 63 year old African-American female, currently employed, divorced, has a history of depression, chronic pain, diabetes mellitus, fibromyalgia, morbid obesity, hypertension, obstructive sleep apnea, hyperlipidemia was evaluated by telemedicine today, presented for a follow-up appointment.  She has been experiencing a challenging few months due to her mother's health issues and her own mental health struggles. Her mother was discharged from the hospital in December after experiencing heart issues and is currently in a rehabilitation facility. As her mother's only child, she is coordinating care with her daughter, planning to bring her mother home with the assistance of  home health services.  She has a history of major depression and anxiety. Currently, she is taking Viibryd , initially starting at 10 mg, which was increased to 20 mg. However, she experienced feeling 'funny' and 'out of control' after the increase, particularly during a fast food drive-through incident, leading her to revert to 10 mg. She notes improvement in social withdrawal and panic attacks, though she still experiences sadness, especially related to her mother's condition. Sleep is disrupted due to thin walls and noise from a neighbor, attributed to situational factors.  She is also taking hydroxyzine , 25 mg daily as needed, and has been using two 10 mg tablets when necessary. She recently started a hormonal patch over a month ago but has not noticed significant changes.  She is in contact with therapist, Miss Junior Olea, and has an appointment scheduled in June.    Denies thoughts of self-harm or harm to others.   Visit Diagnosis:    ICD-10-CM   1. Major depressive disorder, recurrent episode, moderate (HCC)  F33.1     2. Generalized anxiety disorder  F41.1       Past Psychiatric History: I have reviewed past psychiatric history from progress note on 02/08/2024.  Past trials of medications include sertraline , duloxetine , Xanax , Lexapro , Prozac , nortriptyline.  Past Medical History:  Past Medical History:  Diagnosis Date   Anxiety    Arthritis 03/2020   osteoarthritis both knees   Chronic pain of right knee    Chronic sinusitis    DDD (degenerative disc disease), cervical    cervical radiculopathy   Depression    Diabetes mellitus    insulin  dependant   Fibromyalgia  GERD (gastroesophageal reflux disease)    High cholesterol    Hyperlipidemia    Hypertension    Insomnia    Muscle pain    Nasal polyp    Neuromuscular disorder (HCC)    diabetic neuropathy   Obesity    Palpitations    Tendonitis of knee, left 04/2020   received steroid injection   Vitamin D   deficiency    Vitamin D  deficiency     Past Surgical History:  Procedure Laterality Date   BREAST REDUCTION SURGERY     COLONOSCOPY WITH PROPOFOL  N/A 02/03/2023   Procedure: COLONOSCOPY WITH PROPOFOL ;  Surgeon: Luke Salaam, MD;  Location: [facility];  Service: Gastroenterology;  Laterality: N/A;   ETHMOIDECTOMY Right 05/16/2020   Procedure: ETHMOIDECTOMY;  Surgeon: Mellody Sprout, MD;  Location: [facility] ORS;  Service: ENT;  Laterality: Right;   EXCISION ORAL TUMOR N/A 05/16/2020   Procedure: EXCISION OF SOFT PALATE PAPILLOMA;  Surgeon: Mellody Sprout, MD;  Location: [facility] ORS;  Service: ENT;  Laterality: N/A;   FRONTAL SINUS EXPLORATION Right 05/16/2020   Procedure: FRONTAL SINUS EXPLORATION;  Surgeon: Mellody Sprout, MD;  Location: [facility] ORS;  Service: ENT;  Laterality: Right;   IMAGE GUIDED SINUS SURGERY N/A 05/16/2020   Procedure: IMAGE GUIDED SINUS SURGERY;  Surgeon: Mellody Sprout, MD;  Location: [facility] ORS;  Service: ENT;  Laterality: N/A;   MAXILLARY ANTROSTOMY Bilateral 05/16/2020   Procedure: MAXILLARY ANTROSTOMY with tissue removal;  Surgeon: Mellody Sprout, MD;  Location: [facility] ORS;  Service: ENT;  Laterality: Bilateral;   nasal endoscopic     OOPHORECTOMY  ?   one ovary removed    REDUCTION MAMMAPLASTY Bilateral 2000   removal of ovary     SPHENOIDECTOMY Right 05/16/2020   Procedure: SPHENOIDECTOMY;  Surgeon: Mellody Sprout, MD;  Location: [facility] ORS;  Service: ENT;  Laterality: Right;    Family Psychiatric History: I have reviewed family psychiatric history from progress note on 02/08/2024.  Family History:  Family History  Problem Relation Age of Onset   Dementia Mother    Diabetes Mother    Cancer Mother 60       Breast   Breast cancer Mother 73   Gout Mother    Diabetes Father    Breast cancer Maternal Aunt 74   Kidney cancer Maternal Aunt    Kidney disease Maternal Aunt    Stroke Maternal Uncle    Alcohol abuse Daughter    Diabetes Daughter        Oldest Daughter   Alcohol  abuse Daughter    Cancer Half-Sister 5       breast cancer   Prostate cancer Neg Hx    Bladder Cancer Neg Hx     Social History: I have reviewed social history from progress note on 02/08/2024. Social History   Socioeconomic History   Marital status: Divorced    Spouse name: Not on file   Number of children: 2   Years of education: Not on file   Highest education level: Some college, no degree  Occupational History   Occupation: Engineer, mining   Tobacco Use   Smoking status: Never   Smokeless tobacco: Never  Vaping Use   Vaping status: Former  Substance and Sexual Activity   Alcohol use: No    Alcohol/week: 0.0 standard drinks of alcohol   Drug use: No   Sexual activity: Not Currently    Partners: Male    Birth control/protection: None  Other Topics Concern   Not on file  Social History Narrative   Divorced and he died since the divorce   Working at [employer] attorney.    Social Drivers of Health   Financial Resource Strain: Medium Risk (04/06/2024)   Overall Financial Resource Strain (CARDIA)    Difficulty of Paying Living Expenses: Somewhat hard  Food Insecurity: No Food Insecurity (04/06/2024)   Hunger Vital Sign    Worried About Running Out of Food in the Last Year: Never true    Ran Out of Food in the Last Year: Never true  Transportation Needs: No Transportation Needs (04/06/2024)   PRAPARE - Administrator, Civil Service (Medical): No    Lack of Transportation (Non-Medical): No  Physical Activity: Inactive (04/06/2024)   Exercise Vital Sign    Days of Exercise per Week: 0 days    Minutes of Exercise per Session: 0 min  Stress: Stress Concern Present (04/06/2024)   [employer] of Occupational Health - Occupational Stress Questionnaire    Feeling of Stress : Very much  Social Connections: Moderately Isolated (04/06/2024)   Social Connection and Isolation Panel [NHANES]    Frequency of Communication with Friends and Family: More than three times  a week    Frequency of Social Gatherings with Friends and Family: Never    Attends Religious Services: 1 to 4 times per year    Active Member of [employer] or Organizations: No    Attends Banker Meetings: Never    Marital Status: Divorced    Allergies:  Allergies  Allergen Reactions   Simvastatin     Muscle cramps    Trazodone  Hcl     It may have caused sob    Zetia  [Ezetimibe ] Other (See Comments)    Myalgia    Atorvastatin Other (See Comments)    muscle cramps   Compazine  Other (See Comments)    Twitching and spasms of neck muscles   Penicillins Rash    Reaction under 25, patient unsure that she still has an allergy    Metabolic Disorder Labs: Lab Results  Component Value Date   HGBA1C 6.6 (A) 02/04/2024   MPG 166 02/19/2022   MPG 146 04/09/2020   No results found for: "PROLACTIN" Lab Results  Component Value Date   CHOL 211 (H) 05/15/2023   TRIG 107 05/15/2023   HDL 56 05/15/2023   CHOLHDL 3.8 05/15/2023   VLDL 29 06/27/2016   LDLCALC 134 (H) 05/15/2023   LDLCALC 94 02/19/2022   Lab Results  Component Value Date   TSH 3.27 10/08/2023   TSH 3.00 02/22/2021    Therapeutic Level Labs: No results found for: "LITHIUM" No results found for: "VALPROATE" No results found for: "CBMZ"  Current Medications: Current Outpatient Medications  Medication Sig Dispense Refill   estradiol -norethindrone (COMBIPATCH) 0.05-0.14 MG/DAY Place 1 patch onto the skin.     ACCU-CHEK GUIDE TEST test strip USE THREE TIMES DAILY TO TEST BLOOD SUGAR. MORNING, NOON AND BEDTIME. 100 strip 0   Accu-Chek Softclix Lancets lancets USE TO TEST BLOOD SUGAR THREE TIMES DAILY. MORNING, NOON AND BEDTIME. 100 each 0   aspirin EC 81 MG tablet Take 81 mg by mouth daily. Swallow whole.     baclofen  (LIORESAL ) 10 MG tablet Take 10 mg by mouth at bedtime.     Cholecalciferol (VITAMIN D ) 50 MCG (2000 UT) CAPS Take 1 each by mouth daily at 12 noon.     fluticasone  (FLONASE ) 50 MCG/ACT nasal  spray Place 2 sprays into both nostrils daily as needed  for allergies or rhinitis.     GVOKE HYPOPEN  2-PACK 1 MG/0.2ML SOAJ Inject 1 mg into the skin as needed (for hypoglycemia episodes (glucose below 60 and symptomatic)). 0.4 mL 1   hydrOXYzine  (VISTARIL ) 25 MG capsule Take 1 capsule (25 mg total) by mouth daily as needed for anxiety. 30 capsule 1   insulin  glargine, 1 Unit Dial, (TOUJEO  SOLOSTAR) 300 UNIT/ML Solostar Pen Inject 40-50 Units into the skin daily at 12 noon. 4.5 mL 0   Insulin  Pen Needle 32G X 6 MM MISC 1 each by Does not apply route daily at 2 PM. 100 each 1   meclizine (ANTIVERT) 25 MG tablet      Melatonin 10 MG TABS Take 10 mg by mouth at bedtime as needed (sleep).      metFORMIN  (GLUCOPHAGE -XR) 750 MG 24 hr tablet Take 1 tablet (750 mg total) by mouth daily with breakfast. 90 tablet 0   naproxen  (NAPROSYN ) 500 MG tablet Take 1 tablet (500 mg total) by mouth 2 (two) times daily as needed. 14 tablet 0   olmesartan  (BENICAR ) 40 MG tablet Take 1 tablet (40 mg total) by mouth daily. 90 tablet 0   omega-3 acid ethyl esters (LOVAZA ) 1 g capsule Take 2 capsules (2 g total) by mouth 2 (two) times daily. 360 capsule 1   tirzepatide  (MOUNJARO ) 10 MG/0.5ML Pen Inject 10 mg into the skin once a week. 6 mL 0   Vilazodone  HCl (VIIBRYD ) 10 MG TABS Take 1 tablet (10 mg total) by mouth daily for 7 days. Stop after taking for a week . 7 tablet 0   Vilazodone  HCl 20 MG TABS Take 1 tablet (20 mg total) by mouth in the morning. Please start taking after stopping the 10 mg . 30 tablet 1   No current facility-administered medications for this visit.     Musculoskeletal: Strength & Muscle Tone:  UTA Gait & Station:  Seated Patient leans: N/A  Psychiatric Specialty Exam: Review of Systems  Psychiatric/Behavioral:  Positive for dysphoric mood and sleep disturbance. The patient is nervous/anxious.     There were no vitals taken for this visit.There is no height or weight on file to calculate  BMI.  General Appearance: Casual  Eye Contact:  Fair  Speech:  Normal Rate  Volume:  Normal  Mood:  Anxious and Depressed  Affect:  Appropriate  Thought Process:  Goal Directed and Descriptions of Associations: Intact  Orientation:  Full (Time, Place, and Person)  Thought Content: Logical   Suicidal Thoughts:  No  Homicidal Thoughts:  No  Memory:  Immediate;   Fair Recent;   Fair Remote;   Fair  Judgement:  Fair  Insight:  Fair  Psychomotor Activity:  Normal  Concentration:  Concentration: Fair and Attention Span: Fair  Recall:  [employer] of Knowledge: Fair  Language: Fair  Akathisia:  No  Handed:  Right  AIMS (if indicated): not done  Assets:  Desire for Improvement Housing Social Support Transportation  ADL's:  Intact  Cognition: WNL  Sleep:   Poor due to external factors   Screenings: GAD-7    Flowsheet Row Office Visit from 04/13/2024 in [facility]or from 04/06/2024 in [facility] Office Visit from 02/22/2024 in [facility] Office Visit from 02/08/2024 in [facility] Office Visit from 02/04/2024 in [facility]  Total GAD-7 Score 20 20 0 7 0  PHQ2-9    Flowsheet Row Office Visit from 04/13/2024 in [facility] Counselor from 04/06/2024 in [facility] Office Visit from 02/22/2024 in [facility] Office Visit from 02/08/2024 in [facility] Office Visit from 02/04/2024 in [facility]  PHQ-2 Total Score 2 3 0 3 0  PHQ-9 Total Score 13 16 0 9 0      Flowsheet Row Video Visit from 04/13/2024 in [facility] Counselor from 04/06/2024 in [facility] ED from 02/14/2024 in [facility] at [facility]  C-SSRS RISK CATEGORY Low Risk Moderate Risk No Risk        Assessment and Plan:Lindsay Mcdonald is a 63 year old African-American female, employed, divorced, lives in [place], currently manage depression, anxiety, discussed assessment and plan as noted below.  Assessment & Plan Major depression-unstable Major depression exacerbated by caregiving stress. Currently on Viibryd  10 mg with partial improvement in panic attacks and social withdrawal, but persistent sadness related to her mother's health. Sleep disruption due to environmental factors. - Increase Viibryd  to 20 mg and 10 mg on alternate days to adjust to higher dose. - Monitor for side effects, particularly feelings of being out of control or strange sensations. - Continue therapy with Miss Junior Olea and contact if needed before the next appointment. - Consider using a pill box for medication organization. - Continue to work on sleep hygiene and try to limit external distractions at night.  Generalized anxiety disorder-unstable Anxiety symptoms improved with current treatment. No recent panic attacks. Hydroxyzine  25 mg prescribed as needed, with option to continue 20 mg from older prescriptions if preferred. - Continue Hydroxyzine  25 mg as needed  - Continue psychotherapy sessions. - Report any changes in anxiety symptoms.  Follow-up - Follow up in clinic in 4 to 5 weeks or sooner if needed.  Collaboration of Care: Collaboration of Care: Referral or follow-up with counselor/therapist AEB encouraged to continue CBT  Patient/Guardian was advised Release of Information must be obtained prior to any record release in order to collaborate their care with an outside provider. Patient/Guardian was advised if they have not already done so to contact the registration department to sign all necessary forms in order for us  to release information regarding their care.   Consent:  Patient/Guardian gives verbal consent for treatment and assignment of benefits for services provided during this visit. Patient/Guardian expressed understanding and agreed to proceed.  This note was generated in part or whole with voice recognition software. Voice recognition is usually quite accurate but there are transcription errors that can and very often do occur. I apologize for any typographical errors that were not detected and corrected.     Irem Stoneham, MD 04/14/2024, 9:46 AM

## 2024-04-13 NOTE — Progress Notes (Deleted)
 Name: Marketa Midkiff   MRN: [id number]    DOB: Nov 13, 1961   Date:04/13/2024       Progress Note  Subjective  Chief Complaint  Chief Complaint  Patient presents with   Form Completion   {HPI:32069} *** Patient Active Problem List   Diagnosis Date Noted   Current mild episode of major depressive disorder without prior episode (HCC) 02/08/2024   Anxiety disorder 02/08/2024   Herniated intervertebral disc of lumbar spine 08/07/2023   B12 deficiency 05/15/2023   Occult blood positive stool 02/03/2023   Adenomatous polyp of colon 02/03/2023   Dyslipidemia associated with type 2 diabetes mellitus (HCC) 05/21/2021   Hypertension associated with diabetes (HCC) 08/31/2020   History of marijuana use 03/30/2019   Fibromyalgia 12/31/2018   Venous vascular malformations 03/27/2016   Seasonal allergic rhinitis 09/26/2015   Muscle spasms of neck 06/19/2015   Insomnia 06/19/2015   Primary osteoarthritis of right knee 06/17/2015   Depression, major, recurrent, mild (HCC) 06/17/2015   Hyperlipidemia 06/17/2015   Benign hypertension 06/17/2015   Morbid obesity with BMI of 40.0-44.9, adult (HCC) 06/17/2015   Vitamin D  deficiency 06/17/2015    Past Surgical History:  Procedure Laterality Date   BREAST REDUCTION SURGERY     COLONOSCOPY WITH PROPOFOL  N/A 02/03/2023   Procedure: COLONOSCOPY WITH PROPOFOL ;  Surgeon: Luke Salaam, MD;  Location: [facility];  Service: Gastroenterology;  Laterality: N/A;   ETHMOIDECTOMY Right 05/16/2020   Procedure: ETHMOIDECTOMY;  Surgeon: Mellody Sprout, MD;  Location: [facility] ORS;  Service: ENT;  Laterality: Right;   EXCISION ORAL TUMOR N/A 05/16/2020   Procedure: EXCISION OF SOFT PALATE PAPILLOMA;  Surgeon: Mellody Sprout, MD;  Location: [facility] ORS;  Service: ENT;  Laterality: N/A;   FRONTAL SINUS EXPLORATION Right 05/16/2020   Procedure: FRONTAL SINUS EXPLORATION;  Surgeon: Mellody Sprout, MD;  Location: [facility] OR[facility];  Service: ENT;  Laterality: Right;   IMAGE GUIDED SINUS  SURGERY N/A 05/16/2020   Procedure: IMAGE GUIDED SINUS SURGERY;  Surgeon: Mellody Sprout, MD;  Location: [facility] OR[facility];  Service: ENT;  Laterality: N/A;   MAXILLARY ANTROSTOMY Bilateral 05/16/2020   Procedure: MAXILLARY ANTROSTOMY with tissue removal;  Surgeon: Mellody Sprout, MD;  Location: [facility] OR[facility];  Service: ENT;  Laterality: Bilateral;   nasal endoscopic     OOPHORECTOMY  ?   one ovary removed    REDUCTION MAMMAPLASTY Bilateral 2000   removal of ovary     SPHENOIDECTOMY Right 05/16/2020   Procedure: SPHENOIDECTOMY;  Surgeon: Mellody Sprout, MD;  Location: [facility] ORS;  Service: ENT;  Laterality: Right;    Family History  Problem Relation Age of Onset   Dementia Mother    Diabetes Mother    Cancer Mother 52       Breast   Breast cancer Mother 44   Gout Mother    Diabetes Father    Breast cancer Maternal Aunt 21   Kidney cancer Maternal Aunt    Kidney disease Maternal Aunt    Stroke Maternal Uncle    Alcohol abuse Daughter    Diabetes Daughter        Oldest Daughter   Alcohol abuse Daughter    Cancer Half-Sister 67       breast cancer   Prostate cancer Neg Hx    Bladder Cancer Neg Hx     Social History   Tobacco Use   Smoking status: Never   Smokeless tobacco: Never  Substance Use Topics   Alcohol use: No    Alcohol/week: 0.0 standard drinks of alcohol  Current Outpatient Medications:    ACCU-CHEK GUIDE TEST test strip, USE THREE TIMES DAILY TO TEST BLOOD SUGAR. MORNING, NOON AND BEDTIME., Disp: 100 strip, Rfl: 0   Accu-Chek Softclix Lancets lancets, USE TO TEST BLOOD SUGAR THREE TIMES DAILY. MORNING, NOON AND BEDTIME., Disp: 100 each, Rfl: 0   aspirin EC 81 MG tablet, Take 81 mg by mouth daily. Swallow whole., Disp: , Rfl:    baclofen  (LIORESAL ) 10 MG tablet, Take 10 mg by mouth at bedtime., Disp: , Rfl:    Cholecalciferol (VITAMIN D ) 50 MCG (2000 UT) CAPS, Take 1 each by mouth daily at 12 noon., Disp: , Rfl:    fluticasone  (FLONASE ) 50 MCG/ACT nasal spray, Place 2 sprays  into both nostrils daily as needed for allergies or rhinitis., Disp: , Rfl:    GVOKE HYPOPEN  2-PACK 1 MG/0.2ML SOAJ, Inject 1 mg into the skin as needed (for hypoglycemia episodes (glucose below 60 and symptomatic))., Disp: 0.4 mL, Rfl: 1   hydrOXYzine  (VISTARIL ) 25 MG capsule, Take 1 capsule (25 mg total) by mouth daily as needed for anxiety., Disp: 30 capsule, Rfl: 1   insulin  glargine, 1 Unit Dial, (TOUJEO  SOLOSTAR) 300 UNIT/ML Solostar Pen, Inject 40-50 Units into the skin daily at 12 noon., Disp: 4.5 mL, Rfl: 0   Insulin  Pen Needle 32G X 6 MM MISC, 1 each by Does not apply route daily at 2 PM., Disp: 100 each, Rfl: 1   meclizine (ANTIVERT) 25 MG tablet, , Disp: , Rfl:    Melatonin 10 MG TABS, Take 10 mg by mouth at bedtime as needed (sleep). , Disp: , Rfl:    metFORMIN  (GLUCOPHAGE -XR) 750 MG 24 hr tablet, Take 1 tablet (750 mg total) by mouth daily with breakfast., Disp: 90 tablet, Rfl: 0   naproxen  (NAPROSYN ) 500 MG tablet, Take 1 tablet (500 mg total) by mouth 2 (two) times daily as needed., Disp: 14 tablet, Rfl: 0   olmesartan  (BENICAR ) 40 MG tablet, Take 1 tablet (40 mg total) by mouth daily., Disp: 90 tablet, Rfl: 0   omega-3 acid ethyl esters (LOVAZA ) 1 g capsule, Take 2 capsules (2 g total) by mouth 2 (two) times daily., Disp: 360 capsule, Rfl: 1   tirzepatide  (MOUNJARO ) 10 MG/0.5ML Pen, Inject 10 mg into the skin once a week., Disp: 6 mL, Rfl: 0   Vilazodone  HCl 20 MG TABS, Take 1 tablet (20 mg total) by mouth in the morning. Please start taking after stopping the 10 mg ., Disp: 30 tablet, Rfl: 1   Vilazodone  HCl (VIIBRYD ) 10 MG TABS, Take 1 tablet (10 mg total) by mouth daily for 7 days. Stop after taking for a week ., Disp: 7 tablet, Rfl: 0  Allergies  Allergen Reactions   Simvastatin     Muscle cramps    Trazodone  Hcl     It may have caused sob    Zetia  [Ezetimibe ] Other (See Comments)    Myalgia    Atorvastatin Other (See Comments)    muscle cramps   Compazine  Other (See  Comments)    Twitching and spasms of neck muscles   Penicillins Rash    Reaction under 25, patient unsure that she still has an allergy    I personally reviewed {Reviewed:14835} with the patient/caregiver today.   ROS  ***  Objective Physical Exam   Vitals:   04/13/24 0809  BP: 126/76  Pulse: 93  Resp: 16  SpO2: 94%  Weight: 250 lb 11.2 oz (113.7 kg)  Height: 5\' 4"  (1.626  m)    Body mass index is 43.03 kg/m.  Recent Results (from the past 2160 hours)  POCT glycosylated hemoglobin (Hb A1C)     Status: Abnormal   Collection Time: 02/04/24  9:00 AM  Result Value Ref Range   Hemoglobin A1C 6.6 (A) 4.0 - 5.6 %   HbA1c POC (<> result, manual entry)     HbA1c, POC (prediabetic range)     HbA1c, POC (controlled diabetic range)    HM DIABETES EYE EXAM     Status: None   Collection Time: 02/04/24 10:11 AM  Result Value Ref Range   HM Diabetic Eye Exam No Retinopathy No Retinopathy    Comment: Abstracted by HIM  Basic metabolic panel     Status: Abnormal   Collection Time: 02/14/24  3:22 AM  Result Value Ref Range   Sodium 140 135 - 145 mmol/L   Potassium 3.8 3.5 - 5.1 mmol/L   Chloride 107 98 - 111 mmol/L   CO2 25 22 - 32 mmol/L   Glucose, Bld 147 (H) 70 - 99 mg/dL    Comment: Glucose reference range applies only to samples taken after fasting for at least 8 hours.   BUN 25 (H) 6 - 20 mg/dL   Creatinine, Ser 1.30 0.44 - 1.00 mg/dL   Calcium  8.7 (L) 8.9 - 10.3 mg/dL   GFR, Estimated >86 >57 mL/min    Comment: (NOTE) Calculated using the CKD-EPI Creatinine Equation (2021)    Anion gap 8 5 - 15    Comment: Performed at [facility], [address] [place], [place] [address]  CBC     Status: None   Collection Time: 02/14/24  3:22 AM  Result Value Ref Range   WBC 9.8 4.0 - 10.5 K/uL   RBC 4.63 3.87 - 5.11 MIL/uL   Hemoglobin 12.1 12.0 - 15.0 g/dL   HCT 29.5 28.4 - 13.2 %   MCV 83.4 80.0 - 100.0 fL   MCH 26.1 26.0 - 34.0 pg   MCHC 31.3 30.0 - 36.0 g/dL    RDW 44.0 10.2 - 72.5 %   Platelets 231 150 - 400 K/uL   nRBC 0.2 0.0 - 0.2 %    Comment: Performed at [facility], [address], [place], [place] [address]  Troponin I (High Sensitivity)     Status: None   Collection Time: 02/14/24  3:22 AM  Result Value Ref Range   Troponin I (High Sensitivity) 4 <18 ng/L    Comment: (NOTE) Elevated high sensitivity troponin I (hsTnI) values and significant  changes across serial measurements may suggest ACS but many other  chronic and acute conditions are known to elevate hsTnI results.  Refer to the "Links" section for chest pain algorithms and additional  guidance. Performed at [facility], [address] [place], [place] [address]   Resp panel by RT-PCR (RSV, Flu A&B, Covid) Anterior Nasal Swab     Status: None   Collection Time: 02/14/24  3:22 AM   Specimen: Anterior Nasal Swab  Result Value Ref Range   SARS Coronavirus 2 by RT PCR NEGATIVE NEGATIVE    Comment: (NOTE) SARS-CoV-2 target nucleic acids are NOT DETECTED.  The SARS-CoV-2 RNA is generally detectable in upper respiratory specimens during the acute phase of infection. The lowest concentration of SARS-CoV-2 viral copies this assay can detect is 138 copies/mL. A negative result does not preclude SARS-Cov-2 infection and should not be used as the sole basis for treatment or other  patient management decisions. A negative result may occur with  improper specimen collection/handling, submission of specimen other than nasopharyngeal swab, presence of viral mutation(s) within the areas targeted by this assay, and inadequate number of viral copies(<138 copies/mL). A negative result must be combined with clinical observations, patient history, and epidemiological information. The expected result is Negative.  Fact Sheet for Patients:  BloggerCourse.com  Fact Sheet for Healthcare Providers:   SeriousBroker.it  This test is no t yet approved or cleared by the [place]  FDA and  has been authorized for detection and/or diagnosis of SARS-CoV-2 by FDA under an Emergency Use Authorization (EUA). This EUA will remain  in effect (meaning this test can be used) for the duration of the COVID-19 declaration under Section 564(b)(1) of the Act, 21 U.S.C.section 360bbb-3(b)(1), unless the authorization is terminated  or revoked sooner.       Influenza A by PCR NEGATIVE NEGATIVE   Influenza B by PCR NEGATIVE NEGATIVE    Comment: (NOTE) The Xpert Xpress SARS-CoV-2/FLU/RSV plus assay is intended as an aid in the diagnosis of influenza from Nasopharyngeal swab specimens and should not be used as a sole basis for treatment. Nasal washings and aspirates are unacceptable for Xpert Xpress SARS-CoV-2/FLU/RSV testing.  Fact Sheet for Patients: BloggerCourse.com  Fact Sheet for Healthcare Providers: SeriousBroker.it  This test is not yet approved or cleared by the [place]  FDA and has been authorized for detection and/or diagnosis of SARS-CoV-2 by FDA under an Emergency Use Authorization (EUA). This EUA will remain in effect (meaning this test can be used) for the duration of the COVID-19 declaration under Section 564(b)(1) of the Act, 21 U.S.C. section 360bbb-3(b)(1), unless the authorization is terminated or revoked.     Resp Syncytial Virus by PCR NEGATIVE NEGATIVE    Comment: (NOTE) Fact Sheet for Patients: BloggerCourse.com  Fact Sheet for Healthcare Providers: SeriousBroker.it  This test is not yet approved or cleared by the [place]  FDA and has been authorized for detection and/or diagnosis of SARS-CoV-2 by FDA under an Emergency Use Authorization (EUA). This EUA will remain in effect (meaning this test can be used) for the duration of  the COVID-19 declaration under Section 564(b)(1) of the Act, 21 U.S.C. section 360bbb-3(b)(1), unless the authorization is terminated or revoked.  Performed at [facility], [address] [place], [place] [address]   Hepatic function panel     Status: Abnormal   Collection Time: 02/14/24  4:20 AM  Result Value Ref Range   Total Protein 6.7 6.5 - 8.1 g/dL   Albumin 3.2 (L) 3.5 - 5.0 g/dL   AST 29 15 - 41 U/L   ALT 26 0 - 44 U/L   Alkaline Phosphatase 66 38 - 126 U/L   Total Bilirubin 0.4 0.0 - 1.2 mg/dL   Bilirubin, Direct 0.1 0.0 - 0.2 mg/dL   Indirect Bilirubin 0.3 0.3 - 0.9 mg/dL    Comment: Performed at [facility], [address] [place], [place] [address]  Lipase, blood     Status: None   Collection Time: 02/14/24  4:20 AM  Result Value Ref Range   Lipase 49 11 - 51 U/L    Comment: Performed at [facility], [address] [place], [place] [address]  D-dimer, quantitative     Status: None   Collection Time: 02/14/24  4:20 AM  Result Value Ref Range   D-Dimer, Quant 0.34 0.00 - 0.50 ug/mL-FEU    Comment: (NOTE) At the manufacturer cut-off value  of 0.5 g/mL FEU, this assay has a negative predictive value of 95-100%.This assay is intended for use in conjunction with a clinical pretest probability (PTP) assessment model to exclude pulmonary embolism (PE) and deep venous thrombosis (DVT) in outpatients suspected of PE or DVT. Results should be correlated with clinical presentation. Performed at [facility], [address] [place], [place] [address]   Troponin I (High Sensitivity)     Status: None   Collection Time: 02/14/24  5:50 AM  Result Value Ref Range   Troponin I (High Sensitivity) 3 <18 ng/L    Comment: (NOTE) Elevated high sensitivity troponin I (hsTnI) values and significant  changes across serial measurements may suggest ACS but many other  chronic and acute conditions are known to elevate hsTnI results.  Refer  to the "Links" section for chest pain algorithms and additional  guidance. Performed at [facility], [address] [place], [place] [address]   Cytology - PAP     Status: None   Collection Time: 02/22/24  3:31 PM  Result Value Ref Range   High risk HPV Negative    Adequacy      Satisfactory for evaluation; transformation zone component PRESENT.   Diagnosis      - Negative for intraepithelial lesion or malignancy (NILM)   Comment Normal Reference Range HPV - Negative     Diabetic Foot Exam: {Perform Simple Foot Exam  Perform Detailed exam:1} {Insert foot Exam (Optional):30965}   PHQ2/9:    04/13/2024    8:09 AM 04/06/2024    1:06 PM 02/22/2024    3:05 PM 02/08/2024    2:53 PM 02/04/2024    8:55 AM  Depression screen PHQ 2/9  Decreased Interest 1  0  0  Down, Depressed, Hopeless 1  0  0  PHQ - 2 Score 2  0  0  Altered sleeping 0  0  0  Tired, decreased energy 3  0  0  Change in appetite 3  0  0  Feeling bad or failure about yourself  3  0  0  Trouble concentrating 2  0  0  Moving slowly or fidgety/restless 0  0  0  Suicidal thoughts 0  0  0  PHQ-9 Score 13  0  0  Difficult doing work/chores Somewhat difficult  Not difficult at all  Not difficult at all     Information is confidential and restricted. Go to Review Flowsheets to unlock data.    phq 9 is {gen pos UJW:119147}  Fall Risk:    02/04/2024    8:47 AM 11/11/2023   10:02 AM 10/08/2023   11:36 AM 08/07/2023    2:02 PM 05/15/2023   10:01 AM  Fall Risk   Falls in the past year? 0 0 0 0 0  Number falls in past yr: 0 0 0 0   Injury with Fall? 0 0 0 0   Risk for fall due to : No Fall Risks No Fall Risks No Fall Risks No Fall Risks No Fall Risks  Follow up Falls prevention discussed;Falls evaluation completed;Education provided Falls prevention discussed Falls prevention discussed Falls prevention discussed Falls prevention discussed     {A&P:32071}

## 2024-04-13 NOTE — Progress Notes (Signed)
 Name: Lindsay Mcdonald   MRN: [id number]    DOB: Jul 05, 1961   Date:04/13/2024       Progress Note  Subjective  Chief Complaint  Chief Complaint  Patient presents with   Medical Management of Chronic Issues   Discussed the use of AI scribe software for clinical note transcription with the patient, who gave verbal consent to proceed.  History of Present Illness Lindsay Mcdonald is a 63 year old female with diabetes, hypertension, and herniated disc disease who presents for management of her multiple medical conditions.  She has frequent medical appointments due to her multiple health issues, including diabetes, hypertension, anxiety, depression, and herniated disc disease. She sees various specialists, including a cardiologist, neurologist, physiatrist, orthopedic surgeon, and psychiatrist, attending about six medical appointments per month, which sometimes requires taking time off work.  Her diabetes is managed with regular follow-ups every three months. She is compliant with her treatment regimen  She experiences chronic low back pain due to herniated disc disease, which radiates down her right leg. She has received lumbar spine injections and is currently taking tramadol for pain management. She also uses baclofen  for muscle spasms. The pain can flare up, causing significant discomfort and occasionally requiring her to work from home. These flare-ups occur about once every three months, lasting from four hours to five days.  She has a history of anxiety and depression, for which she consults with a psychiatrist and a therapist. She has video consultations scheduled and has recently seen her therapist.  Her hypertension is managed alongside her diabetes, with regular monitoring and treatment adherence.     Patient Active Problem List   Diagnosis Date Noted   Current mild episode of major depressive disorder without prior episode (HCC) 02/08/2024   Anxiety disorder 02/08/2024   Herniated  intervertebral disc of lumbar spine 08/07/2023   B12 deficiency 05/15/2023   Occult blood positive stool 02/03/2023   Adenomatous polyp of colon 02/03/2023   Dyslipidemia associated with type 2 diabetes mellitus (HCC) 05/21/2021   Hypertension associated with diabetes (HCC) 08/31/2020   History of marijuana use 03/30/2019   Fibromyalgia 12/31/2018   Venous vascular malformations 03/27/2016   Seasonal allergic rhinitis 09/26/2015   Muscle spasms of neck 06/19/2015   Insomnia 06/19/2015   Primary osteoarthritis of right knee 06/17/2015   Depression, major, recurrent, mild (HCC) 06/17/2015   Hyperlipidemia 06/17/2015   Benign hypertension 06/17/2015   Morbid obesity with BMI of 40.0-44.9, adult (HCC) 06/17/2015   Vitamin D  deficiency 06/17/2015    Past Surgical History:  Procedure Laterality Date   BREAST REDUCTION SURGERY     COLONOSCOPY WITH PROPOFOL  N/A 02/03/2023   Procedure: COLONOSCOPY WITH PROPOFOL ;  Surgeon: Luke Salaam, MD;  Location: [facility];  Service: Gastroenterology;  Laterality: N/A;   ETHMOIDECTOMY Right 05/16/2020   Procedure: ETHMOIDECTOMY;  Surgeon: Mellody Sprout, MD;  Location: [facility] ORS;  Service: ENT;  Laterality: Right;   EXCISION ORAL TUMOR N/A 05/16/2020   Procedure: EXCISION OF SOFT PALATE PAPILLOMA;  Surgeon: Mellody Sprout, MD;  Location: [facility];  Service: ENT;  Laterality: N/A;   FRONTAL SINUS EXPLORATION Right 05/16/2020   Procedure: FRONTAL SINUS EXPLORATION;  Surgeon: Mellody Sprout, MD;  Location: [facility];  Service: ENT;  Laterality: Right;   IMAGE GUIDED SINUS SURGERY N/A 05/16/2020   Procedure: IMAGE GUIDED SINUS SURGERY;  Surgeon: Mellody Sprout, MD;  Location: [facility];  Service: ENT;  Laterality: N/A;   MAXILLARY ANTROSTOMY Bilateral 05/16/2020   Procedure: MAXILLARY ANTROSTOMY with tissue removal;  Surgeon:  Juengel, Paul, MD;  Location: [facility];  Service: ENT;  Laterality: Bilateral;   nasal endoscopic     OOPHORECTOMY  ?   one ovary removed     REDUCTION MAMMAPLASTY Bilateral 2000   removal of ovary     SPHENOIDECTOMY Right 05/16/2020   Procedure: SPHENOIDECTOMY;  Surgeon: Mellody Sprout, MD;  Location: [facility];  Service: ENT;  Laterality: Right;    Family History  Problem Relation Age of Onset   Dementia Mother    Diabetes Mother    Cancer Mother 59       Breast   Breast cancer Mother 1   Gout Mother    Diabetes Father    Breast cancer Maternal Aunt 75   Kidney cancer Maternal Aunt    Kidney disease Maternal Aunt    Stroke Maternal Uncle    Alcohol abuse Daughter    Diabetes Daughter        Oldest Daughter   Alcohol abuse Daughter    Cancer Half-Sister 78       breast cancer   Prostate cancer Neg Hx    Bladder Cancer Neg Hx     Social History   Tobacco Use   Smoking status: Never   Smokeless tobacco: Never  Substance Use Topics   Alcohol use: No    Alcohol/week: 0.0 standard drinks of alcohol     Current Outpatient Medications:    ACCU-CHEK GUIDE TEST test strip, USE THREE TIMES DAILY TO TEST BLOOD SUGAR. MORNING, NOON AND BEDTIME., Disp: 100 strip, Rfl: 0   Accu-Chek Softclix Lancets lancets, USE TO TEST BLOOD SUGAR THREE TIMES DAILY. MORNING, NOON AND BEDTIME., Disp: 100 each, Rfl: 0   aspirin EC 81 MG tablet, Take 81 mg by mouth daily. Swallow whole., Disp: , Rfl:    baclofen  (LIORESAL ) 10 MG tablet, Take 10 mg by mouth at bedtime., Disp: , Rfl:    Cholecalciferol (VITAMIN D ) 50 MCG (2000 UT) CAPS, Take 1 each by mouth daily at 12 noon., Disp: , Rfl:    fluticasone  (FLONASE ) 50 MCG/ACT nasal spray, Place 2 sprays into both nostrils daily as needed for allergies or rhinitis., Disp: , Rfl:    GVOKE HYPOPEN  2-PACK 1 MG/0.2ML SOAJ, Inject 1 mg into the skin as needed (for hypoglycemia episodes (glucose below 60 and symptomatic))., Disp: 0.4 mL, Rfl: 1   hydrOXYzine  (VISTARIL ) 25 MG capsule, Take 1 capsule (25 mg total) by mouth daily as needed for anxiety., Disp: 30 capsule, Rfl: 1   insulin  glargine, 1  Unit Dial, (TOUJEO  SOLOSTAR) 300 UNIT/ML Solostar Pen, Inject 40-50 Units into the skin daily at 12 noon., Disp: 4.5 mL, Rfl: 0   Insulin  Pen Needle 32G X 6 MM MISC, 1 each by Does not apply route daily at 2 PM., Disp: 100 each, Rfl: 1   meclizine (ANTIVERT) 25 MG tablet, , Disp: , Rfl:    Melatonin 10 MG TABS, Take 10 mg by mouth at bedtime as needed (sleep). , Disp: , Rfl:    metFORMIN  (GLUCOPHAGE -XR) 750 MG 24 hr tablet, Take 1 tablet (750 mg total) by mouth daily with breakfast., Disp: 90 tablet, Rfl: 0   naproxen  (NAPROSYN ) 500 MG tablet, Take 1 tablet (500 mg total) by mouth 2 (two) times daily as needed., Disp: 14 tablet, Rfl: 0   olmesartan  (BENICAR ) 40 MG tablet, Take 1 tablet (40 mg total) by mouth daily., Disp: 90 tablet, Rfl: 0   omega-3 acid ethyl esters (LOVAZA ) 1 g capsule, Take 2 capsules (  2 g total) by mouth 2 (two) times daily., Disp: 360 capsule, Rfl: 1   tirzepatide  (MOUNJARO ) 10 MG/0.5ML Pen, Inject 10 mg into the skin once a week., Disp: 6 mL, Rfl: 0   Vilazodone  HCl 20 MG TABS, Take 1 tablet (20 mg total) by mouth in the morning. Please start taking after stopping the 10 mg ., Disp: 30 tablet, Rfl: 1   Vilazodone  HCl (VIIBRYD ) 10 MG TABS, Take 1 tablet (10 mg total) by mouth daily for 7 days. Stop after taking for a week ., Disp: 7 tablet, Rfl: 0  Allergies  Allergen Reactions   Simvastatin     Muscle cramps    Trazodone  Hcl     It may have caused sob    Zetia  [Ezetimibe ] Other (See Comments)    Myalgia    Atorvastatin Other (See Comments)    muscle cramps   Compazine  Other (See Comments)    Twitching and spasms of neck muscles   Penicillins Rash    Reaction under 25, patient unsure that she still has an allergy    I personally reviewed active problem list, medication list, allergies with the patient/caregiver today.   ROS  Ten systems reviewed and is negative except as mentioned in HPI    Objective Physical Exam CONSTITUTIONAL: Patient appears  well-developed and well-nourished. No distress. HEENT: Head atraumatic, normocephalic, neck supple. CARDIOVASCULAR: Normal rate, regular rhythm and normal heart sounds. No murmur heard. No BLE edema. PULMONARY: Effort normal and breath sounds normal. Lungs clear to auscultation bilaterally. No respiratory distress. ABDOMINAL: There is no tenderness or distention. MUSCULOSKELETAL: Normal gait. Without gross motor or sensory deficit. Pain during palpation of lumbar spine, negative straight leg raise PSYCHIATRIC: Patient has a normal mood and affect. Behavior is normal. Judgment and thought content normal.  Vitals:   04/13/24 0809  BP: 126/76  Pulse: 93  Resp: 16  SpO2: 94%  Weight: 250 lb 11.2 oz (113.7 kg)  Height: 5\' 4"  (1.626 m)    Body mass index is 43.03 kg/m.  Recent Results (from the past 2160 hours)  POCT glycosylated hemoglobin (Hb A1C)     Status: Abnormal   Collection Time: 02/04/24  9:00 AM  Result Value Ref Range   Hemoglobin A1C 6.6 (A) 4.0 - 5.6 %   HbA1c POC (<> result, manual entry)     HbA1c, POC (prediabetic range)     HbA1c, POC (controlled diabetic range)    HM DIABETES EYE EXAM     Status: None   Collection Time: 02/04/24 10:11 AM  Result Value Ref Range   HM Diabetic Eye Exam No Retinopathy No Retinopathy    Comment: Abstracted by HIM  Basic metabolic panel     Status: Abnormal   Collection Time: 02/14/24  3:22 AM  Result Value Ref Range   Sodium 140 135 - 145 mmol/L   Potassium 3.8 3.5 - 5.1 mmol/L   Chloride 107 98 - 111 mmol/L   CO2 25 22 - 32 mmol/L   Glucose, Bld 147 (H) 70 - 99 mg/dL    Comment: Glucose reference range applies only to samples taken after fasting for at least 8 hours.   BUN 25 (H) 6 - 20 mg/dL   Creatinine, Ser 5.95 0.44 - 1.00 mg/dL   Calcium  8.7 (L) 8.9 - 10.3 mg/dL   GFR, Estimated >63 >87 mL/min    Comment: (NOTE) Calculated using the CKD-EPI Creatinine Equation (2021)    Anion gap 8 5 - 15  Comment: Performed at  [facility], [address] [place], [place] [address]  CBC     Status: None   Collection Time: 02/14/24  3:22 AM  Result Value Ref Range   WBC 9.8 4.0 - 10.5 K/uL   RBC 4.63 3.87 - 5.11 MIL/uL   Hemoglobin 12.1 12.0 - 15.0 g/dL   HCT 69.6 29.5 - 28.4 %   MCV 83.4 80.0 - 100.0 fL   MCH 26.1 26.0 - 34.0 pg   MCHC 31.3 30.0 - 36.0 g/dL   RDW 13.2 44.0 - 10.2 %   Platelets 231 150 - 400 K/uL   nRBC 0.2 0.0 - 0.2 %    Comment: Performed at [facility], [address], [place], [place] [address]  Troponin I (High Sensitivity)     Status: None   Collection Time: 02/14/24  3:22 AM  Result Value Ref Range   Troponin I (High Sensitivity) 4 <18 ng/L    Comment: (NOTE) Elevated high sensitivity troponin I (hsTnI) values and significant  changes across serial measurements may suggest ACS but many other  chronic and acute conditions are known to elevate hsTnI results.  Refer to the "Links" section for chest pain algorithms and additional  guidance. Performed at [facility], [address] [place], [place] [address]   Resp panel by RT-PCR (RSV, Flu A&B, Covid) Anterior Nasal Swab     Status: None   Collection Time: 02/14/24  3:22 AM   Specimen: Anterior Nasal Swab  Result Value Ref Range   SARS Coronavirus 2 by RT PCR NEGATIVE NEGATIVE    Comment: (NOTE) SARS-CoV-2 target nucleic acids are NOT DETECTED.  The SARS-CoV-2 RNA is generally detectable in upper respiratory specimens during the acute phase of infection. The lowest concentration of SARS-CoV-2 viral copies this assay can detect is 138 copies/mL. A negative result does not preclude SARS-Cov-2 infection and should not be used as the sole basis for treatment or other patient management decisions. A negative result may occur with  improper specimen collection/handling, submission of specimen other than nasopharyngeal swab, presence of viral mutation(s) within the areas targeted by this assay, and  inadequate number of viral copies(<138 copies/mL). A negative result must be combined with clinical observations, patient history, and epidemiological information. The expected result is Negative.  Fact Sheet for Patients:  BloggerCourse.com  Fact Sheet for Healthcare Providers:  SeriousBroker.it  This test is no t yet approved or cleared by the [place]  FDA and  has been authorized for detection and/or diagnosis of SARS-CoV-2 by FDA under an Emergency Use Authorization (EUA). This EUA will remain  in effect (meaning this test can be used) for the duration of the COVID-19 declaration under Section 564(b)(1) of the Act, 21 U.S.C.section 360bbb-3(b)(1), unless the authorization is terminated  or revoked sooner.       Influenza A by PCR NEGATIVE NEGATIVE   Influenza B by PCR NEGATIVE NEGATIVE    Comment: (NOTE) The Xpert Xpress SARS-CoV-2/FLU/RSV plus assay is intended as an aid in the diagnosis of influenza from Nasopharyngeal swab specimens and should not be used as a sole basis for treatment. Nasal washings and aspirates are unacceptable for Xpert Xpress SARS-CoV-2/FLU/RSV testing.  Fact Sheet for Patients: BloggerCourse.com  Fact Sheet for Healthcare Providers: SeriousBroker.it  This test is not yet approved or cleared by the [place]  FDA and has been authorized for detection and/or diagnosis of SARS-CoV-2 by FDA under an Emergency Use Authorization (EUA). This EUA will remain in  effect (meaning this test can be used) for the duration of the COVID-19 declaration under Section 564(b)(1) of the Act, 21 U.S.C. section 360bbb-3(b)(1), unless the authorization is terminated or revoked.     Resp Syncytial Virus by PCR NEGATIVE NEGATIVE    Comment: (NOTE) Fact Sheet for Patients: BloggerCourse.com  Fact Sheet for Healthcare  Providers: SeriousBroker.it  This test is not yet approved or cleared by the [place]  FDA and has been authorized for detection and/or diagnosis of SARS-CoV-2 by FDA under an Emergency Use Authorization (EUA). This EUA will remain in effect (meaning this test can be used) for the duration of the COVID-19 declaration under Section 564(b)(1) of the Act, 21 U.S.C. section 360bbb-3(b)(1), unless the authorization is terminated or revoked.  Performed at [facility], [address] [place], [place] [address]   Hepatic function panel     Status: Abnormal   Collection Time: 02/14/24  4:20 AM  Result Value Ref Range   Total Protein 6.7 6.5 - 8.1 g/dL   Albumin 3.2 (L) 3.5 - 5.0 g/dL   AST 29 15 - 41 U/L   ALT 26 0 - 44 U/L   Alkaline Phosphatase 66 38 - 126 U/L   Total Bilirubin 0.4 0.0 - 1.2 mg/dL   Bilirubin, Direct 0.1 0.0 - 0.2 mg/dL   Indirect Bilirubin 0.3 0.3 - 0.9 mg/dL    Comment: Performed at [facility], [address] [place], [place] [address]  Lipase, blood     Status: None   Collection Time: 02/14/24  4:20 AM  Result Value Ref Range   Lipase 49 11 - 51 U/L    Comment: Performed at [facility], [address] [place], [place] [address]  D-dimer, quantitative     Status: None   Collection Time: 02/14/24  4:20 AM  Result Value Ref Range   D-Dimer, Quant 0.34 0.00 - 0.50 ug/mL-FEU    Comment: (NOTE) At the manufacturer cut-off value of 0.5 g/mL FEU, this assay has a negative predictive value of 95-100%.This assay is intended for use in conjunction with a clinical pretest probability (PTP) assessment model to exclude pulmonary embolism (PE) and deep venous thrombosis (DVT) in outpatients suspected of PE or DVT. Results should be correlated with clinical presentation. Performed at [facility], [address] [place], [place] [address]   Troponin I (High Sensitivity)     Status: None    Collection Time: 02/14/24  5:50 AM  Result Value Ref Range   Troponin I (High Sensitivity) 3 <18 ng/L    Comment: (NOTE) Elevated high sensitivity troponin I (hsTnI) values and significant  changes across serial measurements may suggest ACS but many other  chronic and acute conditions are known to elevate hsTnI results.  Refer to the "Links" section for chest pain algorithms and additional  guidance. Performed at [facility], [address] [place], [place] [address]   Cytology - PAP     Status: None   Collection Time: 02/22/24  3:31 PM  Result Value Ref Range   High risk HPV Negative    Adequacy      Satisfactory for evaluation; transformation zone component PRESENT.   Diagnosis      - Negative for intraepithelial lesion or malignancy (NILM)   Comment Normal Reference Range HPV - Negative     Diabetic Foot Exam:     PHQ2/9:    04/13/2024    8:09 AM 04/06/2024    1:06 PM 02/22/2024  3:05 PM 02/08/2024    2:53 PM 02/04/2024    8:55 AM  Depression screen PHQ 2/9  Decreased Interest 1  0  0  Down, Depressed, Hopeless 1  0  0  PHQ - 2 Score 2  0  0  Altered sleeping 0  0  0  Tired, decreased energy 3  0  0  Change in appetite 3  0  0  Feeling bad or failure about yourself  3  0  0  Trouble concentrating 2  0  0  Moving slowly or fidgety/restless 0  0  0  Suicidal thoughts 0  0  0  PHQ-9 Score 13  0  0  Difficult doing work/chores Somewhat difficult  Not difficult at all  Not difficult at all     Information is confidential and restricted. Go to Review Flowsheets to unlock data.    phq 9 is positive  Fall Risk:    02/04/2024    8:47 AM 11/11/2023   10:02 AM 10/08/2023   11:36 AM 08/07/2023    2:02 PM 05/15/2023   10:01 AM  Fall Risk   Falls in the past year? 0 0 0 0 0  Number falls in past yr: 0 0 0 0   Injury with Fall? 0 0 0 0   Risk for fall due to : No Fall Risks No Fall Risks No Fall Risks No Fall Risks No Fall Risks  Follow up Falls prevention  discussed;Falls evaluation completed;Education provided Falls prevention discussed Falls prevention discussed Falls prevention discussed Falls prevention discussed      Assessment & Plan  Herniated disc with radiculitis Herniated disc with radiculitis causing right leg pain, managed with tramadol and baclofen . - Continue tramadol for pain management. - Continue baclofen  for muscle spasms. - Document work leave due to herniated disc with radiculitis.  Depression and anxiety Depression and anxiety managed with psychiatric care. Scheduled video chat with Dr. Luis Sailors, recent visit wit Ms. Ganey - therapist  Hypertension Hypertension managed with regular follow-ups, compliant with visits every three months.  Type 2 diabetes mellitus Type 2 diabetes mellitus managed with regular follow-ups, compliant with visits every three months.  FMLA forms filled out for work today, keep regular follow up in May to check A1C

## 2024-04-15 ENCOUNTER — Telehealth: Payer: Self-pay

## 2024-04-15 ENCOUNTER — Other Ambulatory Visit: Payer: Self-pay | Admitting: Family Medicine

## 2024-04-15 DIAGNOSIS — E1142 Type 2 diabetes mellitus with diabetic polyneuropathy: Secondary | ICD-10-CM

## 2024-04-15 NOTE — Telephone Encounter (Signed)
 Prior Auth on  TOUJEO  SOLOSTAR 300 UNIT/ML Solostar Pen    Key [id number]

## 2024-04-18 ENCOUNTER — Ambulatory Visit

## 2024-04-18 DIAGNOSIS — M5126 Other intervertebral disc displacement, lumbar region: Secondary | ICD-10-CM | POA: Diagnosis not present

## 2024-04-18 DIAGNOSIS — Z79899 Other long term (current) drug therapy: Secondary | ICD-10-CM | POA: Diagnosis not present

## 2024-04-18 DIAGNOSIS — M1611 Unilateral primary osteoarthritis, right hip: Secondary | ICD-10-CM | POA: Diagnosis not present

## 2024-04-18 DIAGNOSIS — M5416 Radiculopathy, lumbar region: Secondary | ICD-10-CM | POA: Diagnosis not present

## 2024-04-18 NOTE — Telephone Encounter (Signed)
 PA submitted waiting on insurance to determine

## 2024-04-19 ENCOUNTER — Encounter

## 2024-04-20 ENCOUNTER — Encounter

## 2024-04-20 ENCOUNTER — Encounter: Payer: Self-pay | Admitting: Family Medicine

## 2024-04-21 ENCOUNTER — Encounter

## 2024-04-25 ENCOUNTER — Ambulatory Visit: Admitting: Family Medicine

## 2024-04-25 ENCOUNTER — Encounter: Payer: Self-pay | Admitting: Family Medicine

## 2024-04-25 ENCOUNTER — Encounter

## 2024-04-25 ENCOUNTER — Ambulatory Visit

## 2024-04-25 VITALS — BP 124/80 | HR 82 | Resp 16 | Ht 64.0 in | Wt 247.0 lb

## 2024-04-25 DIAGNOSIS — Z6841 Body Mass Index (BMI) 40.0 and over, adult: Secondary | ICD-10-CM | POA: Diagnosis not present

## 2024-04-25 DIAGNOSIS — G720 Drug-induced myopathy: Secondary | ICD-10-CM | POA: Diagnosis not present

## 2024-04-25 DIAGNOSIS — E1142 Type 2 diabetes mellitus with diabetic polyneuropathy: Secondary | ICD-10-CM | POA: Diagnosis not present

## 2024-04-25 DIAGNOSIS — F331 Major depressive disorder, recurrent, moderate: Secondary | ICD-10-CM | POA: Diagnosis not present

## 2024-04-25 DIAGNOSIS — E1169 Type 2 diabetes mellitus with other specified complication: Secondary | ICD-10-CM | POA: Diagnosis not present

## 2024-04-25 DIAGNOSIS — E669 Obesity, unspecified: Secondary | ICD-10-CM | POA: Diagnosis not present

## 2024-04-25 DIAGNOSIS — M545 Low back pain, unspecified: Secondary | ICD-10-CM

## 2024-04-25 DIAGNOSIS — E1159 Type 2 diabetes mellitus with other circulatory complications: Secondary | ICD-10-CM

## 2024-04-25 DIAGNOSIS — G4733 Obstructive sleep apnea (adult) (pediatric): Secondary | ICD-10-CM

## 2024-04-25 DIAGNOSIS — E785 Hyperlipidemia, unspecified: Secondary | ICD-10-CM

## 2024-04-25 DIAGNOSIS — I152 Hypertension secondary to endocrine disorders: Secondary | ICD-10-CM

## 2024-04-25 LAB — POCT GLYCOSYLATED HEMOGLOBIN (HGB A1C): Hemoglobin A1C: 6.7 % — AB (ref 4.0–5.6)

## 2024-04-25 MED ORDER — MOUNJARO 12.5 MG/0.5ML ~~LOC~~ SOAJ: 12.5000 mg | SUBCUTANEOUS | 0 refills | Status: DC

## 2024-04-25 MED ORDER — TOUJEO SOLOSTAR 300 UNIT/ML ~~LOC~~ SOPN: 40.0000 [IU] | PEN_INJECTOR | SUBCUTANEOUS | 0 refills | Status: DC

## 2024-04-25 MED ORDER — METFORMIN HCL ER 750 MG PO TB24
750.0000 mg | ORAL_TABLET | Freq: Every day | ORAL | 0 refills | Status: DC
Start: 2024-04-25 — End: 2024-07-21

## 2024-04-25 MED ORDER — OLMESARTAN MEDOXOMIL 40 MG PO TABS: 40.0000 mg | ORAL_TABLET | Freq: Every day | ORAL | 1 refills | Status: DC

## 2024-04-25 NOTE — Progress Notes (Signed)
 Name: Paidyn Asfour   MRN: [id number]    DOB: July 25, 1961   Date:04/25/2024       Progress Note  Subjective  Chief Complaint  Chief Complaint  Patient presents with   Medical Management of Chronic Issues   Discussed the use of AI scribe software for clinical note transcription with the patient, who gave verbal consent to proceed.  History of Present Illness Lilliann Ohaver is a 63 year old female with diabetes and hypertension who presents for a regular follow-up and reports back pain.  She experiences intermittent shooting pain in the middle of her lower back, which began after lifting her mother, who requires constant care. The pain is not associated with bowel or bladder issues. She manages the pain with tramadol, baclofen , and naproxen  but has not tried topical treatments like Biofreeze or Icy Hot. She has been seeing a physiatrist for her back pain and has had an MRI of the lumbar spine, which showed some disc protrusion. Discussed following up with them if symptoms gets worse  Her diabetes is controlled with a recent A1c increase from 6.6 to 6.7. She takes metformin  and Mounjaro , with plans to increase the Mounjaro  dose to 12.5 mg. She also uses Toujeo  insulin , administering about 30 units daily, reduced from 40 units due to hypoglycemia. She has not been regularly checking her blood sugar levels. No significant changes in appetite, thirst, or urination are noted.  She has a history of neuropathy but currently reports no tingling, numbness, or burning sensations in her feet.   She is experiencing stress related to caring for her mother, who requires home health services. Her daughter assists with caregiving duties. She has been out of work for three days and is considering returning to work today . She is under psychiatric care, taking Viibryd , and alternating between 10 mg and 20 mg doses. Her next psychiatric appointment is scheduled for June 4.  She has a history of obesity with a BMI  over 40. She has lost weight, dropping from 260 lbs to 247 lbs, which she attributes to stress and medication.   She has obstructive  sleep apnea and has not experienced episodes of gasping for air since sleeping in a regular bed. She never went back for titration study.  Her hypertension is well-controlled with Losartan 40 mg. She has not picked up her Lovaza  prescription for dyslipidemia, which she is supposed to take due to statin intolerance. She has experienced myalgia from statins or Zetia .  She has not had a mammogram in a long time and is due for one.    Patient Active Problem List   Diagnosis Date Noted   Major depressive disorder, recurrent episode, moderate (HCC) 04/13/2024   Current mild episode of major depressive disorder without prior episode (HCC) 02/08/2024   Anxiety disorder 02/08/2024   Herniated intervertebral disc of lumbar spine 08/07/2023   B12 deficiency 05/15/2023   Occult blood positive stool 02/03/2023   Adenomatous polyp of colon 02/03/2023   Dyslipidemia associated with type 2 diabetes mellitus (HCC) 05/21/2021   Hypertension associated with diabetes (HCC) 08/31/2020   History of marijuana use 03/30/2019   Fibromyalgia 12/31/2018   Venous vascular malformations 03/27/2016   Seasonal allergic rhinitis 09/26/2015   Muscle spasms of neck 06/19/2015   Insomnia 06/19/2015   Primary osteoarthritis of right knee 06/17/2015   Depression, major, recurrent, mild (HCC) 06/17/2015   Hyperlipidemia 06/17/2015   Benign hypertension 06/17/2015   Morbid obesity with BMI of 40.0-44.9, adult (HCC) 06/17/2015  Vitamin D  deficiency 06/17/2015    Past Surgical History:  Procedure Laterality Date   BREAST REDUCTION SURGERY     COLONOSCOPY WITH PROPOFOL  N/A 02/03/2023   Procedure: COLONOSCOPY WITH PROPOFOL ;  Surgeon: Luke Salaam, MD;  Location: [facility];  Service: Gastroenterology;  Laterality: N/A;   ETHMOIDECTOMY Right 05/16/2020   Procedure: ETHMOIDECTOMY;   Surgeon: Mellody Sprout, MD;  Location: [facility] ORS;  Service: ENT;  Laterality: Right;   EXCISION ORAL TUMOR N/A 05/16/2020   Procedure: EXCISION OF SOFT PALATE PAPILLOMA;  Surgeon: Mellody Sprout, MD;  Location: [facility] ORS;  Service: ENT;  Laterality: N/A;   FRONTAL SINUS EXPLORATION Right 05/16/2020   Procedure: FRONTAL SINUS EXPLORATION;  Surgeon: Mellody Sprout, MD;  Location: [facility] ORS;  Service: ENT;  Laterality: Right;   IMAGE GUIDED SINUS SURGERY N/A 05/16/2020   Procedure: IMAGE GUIDED SINUS SURGERY;  Surgeon: Mellody Sprout, MD;  Location: [facility] ORS;  Service: ENT;  Laterality: N/A;   MAXILLARY ANTROSTOMY Bilateral 05/16/2020   Procedure: MAXILLARY ANTROSTOMY with tissue removal;  Surgeon: Mellody Sprout, MD;  Location: [facility] OR[facility];  Service: ENT;  Laterality: Bilateral;   nasal endoscopic     OOPHORECTOMY  ?   one ovary removed    REDUCTION MAMMAPLASTY Bilateral 2000   removal of ovary     SPHENOIDECTOMY Right 05/16/2020   Procedure: SPHENOIDECTOMY;  Surgeon: Mellody Sprout, MD;  Location: [facility] ORS;  Service: ENT;  Laterality: Right;    Family History  Problem Relation Age of Onset   Dementia Mother    Diabetes Mother    Cancer Mother 21       Breast   Breast cancer Mother 84   Gout Mother    Diabetes Father    Breast cancer Maternal Aunt 40   Kidney cancer Maternal Aunt    Kidney disease Maternal Aunt    Stroke Maternal Uncle    Alcohol abuse Daughter    Diabetes Daughter        Oldest Daughter   Alcohol abuse Daughter    Cancer Half-Sister 50       breast cancer   Prostate cancer Neg Hx    Bladder Cancer Neg Hx     Social History   Tobacco Use   Smoking status: Never   Smokeless tobacco: Never  Substance Use Topics   Alcohol use: No    Alcohol/week: 0.0 standard drinks of alcohol     Current Outpatient Medications:    ACCU-CHEK GUIDE TEST test strip, USE THREE TIMES DAILY TO TEST BLOOD SUGAR. MORNING, NOON AND BEDTIME., Disp: 100 strip, Rfl: 0   Accu-Chek Softclix  Lancets lancets, USE TO TEST BLOOD SUGAR THREE TIMES DAILY. MORNING, NOON AND BEDTIME., Disp: 100 each, Rfl: 0   aspirin EC 81 MG tablet, Take 81 mg by mouth daily. Swallow whole., Disp: , Rfl:    baclofen  (LIORESAL ) 10 MG tablet, Take 10 mg by mouth at bedtime., Disp: , Rfl:    Cholecalciferol (VITAMIN D ) 50 MCG (2000 UT) CAPS, Take 1 each by mouth daily at 12 noon., Disp: , Rfl:    estradiol -norethindrone (COMBIPATCH) 0.05-0.14 MG/DAY, Place 1 patch onto the skin., Disp: , Rfl:    fluticasone  (FLONASE ) 50 MCG/ACT nasal spray, Place 2 sprays into both nostrils daily as needed for allergies or rhinitis., Disp: , Rfl:    GVOKE HYPOPEN  2-PACK 1 MG/0.2ML SOAJ, Inject 1 mg into the skin as needed (for hypoglycemia episodes (glucose below 60 and symptomatic))., Disp: 0.4 mL, Rfl: 1   hydrOXYzine  (VISTARIL )  25 MG capsule, Take 1 capsule (25 mg total) by mouth daily as needed for anxiety., Disp: 30 capsule, Rfl: 1   Insulin  Pen Needle 32G X 6 MM MISC, 1 each by Does not apply route daily at 2 PM., Disp: 100 each, Rfl: 1   meclizine (ANTIVERT) 25 MG tablet, , Disp: , Rfl:    Melatonin 10 MG TABS, Take 10 mg by mouth at bedtime as needed (sleep). , Disp: , Rfl:    metFORMIN  (GLUCOPHAGE -XR) 750 MG 24 hr tablet, Take 1 tablet (750 mg total) by mouth daily with breakfast., Disp: 90 tablet, Rfl: 0   naproxen  (NAPROSYN ) 500 MG tablet, Take 1 tablet (500 mg total) by mouth 2 (two) times daily as needed., Disp: 14 tablet, Rfl: 0   olmesartan  (BENICAR ) 40 MG tablet, Take 1 tablet (40 mg total) by mouth daily., Disp: 90 tablet, Rfl: 0   omega-3 acid ethyl esters (LOVAZA ) 1 g capsule, Take 2 capsules (2 g total) by mouth 2 (two) times daily., Disp: 360 capsule, Rfl: 1   tirzepatide  (MOUNJARO ) 10 MG/0.5ML Pen, Inject 10 mg into the skin once a week., Disp: 6 mL, Rfl: 0   TOUJEO  SOLOSTAR 300 UNIT/ML Solostar Pen, INJECT 40-50 UNITS INTO THE SKIN DAILY AT 12 NOON., Disp: 4.5 mL, Rfl: 0   traMADol (ULTRAM) 50 MG tablet,  Take 50 mg by mouth 2 (two) times daily as needed., Disp: , Rfl:    Vilazodone  HCl (VIIBRYD ) 10 MG TABS, Take 1 tablet (10 mg total) by mouth daily for 7 days. Stop after taking for a week ., Disp: 7 tablet, Rfl: 0   Vilazodone  HCl 20 MG TABS, Take 1 tablet (20 mg total) by mouth in the morning. Please start taking after stopping the 10 mg ., Disp: 30 tablet, Rfl: 1  Allergies  Allergen Reactions   Simvastatin     Muscle cramps    Trazodone  Hcl     It may have caused sob    Zetia  [Ezetimibe ] Other (See Comments)    Myalgia    Atorvastatin Other (See Comments)    muscle cramps   Compazine  Other (See Comments)    Twitching and spasms of neck muscles   Penicillins Rash    Reaction under 25, patient unsure that she still has an allergy    I personally reviewed active problem list, medication list, allergies, family history with the patient/caregiver today.   ROS  Ten systems reviewed and is negative except as mentioned in HPI    Objective Physical Exam Constitutional: Patient appears well-developed and well-nourished. Obese  No distress.  HEENT: head atraumatic, normocephalic, pupils equal and reactive to light, neck supple Cardiovascular: Normal rate, regular rhythm and normal heart sounds.  No murmur heard. No BLE edema. Pulmonary/Chest: Effort normal and breath sounds normal. No respiratory distress. Abdominal: Soft.  There is no tenderness. Psychiatric: Patient has a normal mood and affect. behavior is normal. Judgment and thought content normal.   Vitals:   04/25/24 0818  BP: 124/80  Pulse: 82  Resp: 16  SpO2: 100%  Weight: 247 lb (112 kg)  Height: 5\' 4"  (1.626 m)    Body mass index is 42.4 kg/m.  Recent Results (from the past 2160 hours)  POCT glycosylated hemoglobin (Hb A1C)     Status: Abnormal   Collection Time: 02/04/24  9:00 AM  Result Value Ref Range   Hemoglobin A1C 6.6 (A) 4.0 - 5.6 %   HbA1c POC (<> result, manual entry)  HbA1c, POC (prediabetic  range)     HbA1c, POC (controlled diabetic range)    HM DIABETES EYE EXAM     Status: None   Collection Time: 02/04/24 10:11 AM  Result Value Ref Range   HM Diabetic Eye Exam No Retinopathy No Retinopathy    Comment: Abstracted by HIM  Basic metabolic panel     Status: Abnormal   Collection Time: 02/14/24  3:22 AM  Result Value Ref Range   Sodium 140 135 - 145 mmol/L   Potassium 3.8 3.5 - 5.1 mmol/L   Chloride 107 98 - 111 mmol/L   CO2 25 22 - 32 mmol/L   Glucose, Bld 147 (H) 70 - 99 mg/dL    Comment: Glucose reference range applies only to samples taken after fasting for at least 8 hours.   BUN 25 (H) 6 - 20 mg/dL   Creatinine, Ser 9.60 0.44 - 1.00 mg/dL   Calcium  8.7 (L) 8.9 - 10.3 mg/dL   GFR, Estimated >45 >40 mL/min    Comment: (NOTE) Calculated using the CKD-EPI Creatinine Equation (2021)    Anion gap 8 5 - 15    Comment: Performed at [facility], [address] [place], [place] [address]  CBC     Status: None   Collection Time: 02/14/24  3:22 AM  Result Value Ref Range   WBC 9.8 4.0 - 10.5 K/uL   RBC 4.63 3.87 - 5.11 MIL/uL   Hemoglobin 12.1 12.0 - 15.0 g/dL   HCT 14.7 82.9 - 56.2 %   MCV 83.4 80.0 - 100.0 fL   MCH 26.1 26.0 - 34.0 pg   MCHC 31.3 30.0 - 36.0 g/dL   RDW 13.0 86.5 - 78.4 %   Platelets 231 150 - 400 K/uL   nRBC 0.2 0.0 - 0.2 %    Comment: Performed at [facility], [address], [place], [place] [address]  Troponin I (High Sensitivity)     Status: None   Collection Time: 02/14/24  3:22 AM  Result Value Ref Range   Troponin I (High Sensitivity) 4 <18 ng/L    Comment: (NOTE) Elevated high sensitivity troponin I (hsTnI) values and significant  changes across serial measurements may suggest ACS but many other  chronic and acute conditions are known to elevate hsTnI results.  Refer to the "Links" section for chest pain algorithms and additional  guidance. Performed at [facility], [address] [place], [place]  [address]   Resp panel by RT-PCR (RSV, Flu A&B, Covid) Anterior Nasal Swab     Status: None   Collection Time: 02/14/24  3:22 AM   Specimen: Anterior Nasal Swab  Result Value Ref Range   SARS Coronavirus 2 by RT PCR NEGATIVE NEGATIVE    Comment: (NOTE) SARS-CoV-2 target nucleic acids are NOT DETECTED.  The SARS-CoV-2 RNA is generally detectable in upper respiratory specimens during the acute phase of infection. The lowest concentration of SARS-CoV-2 viral copies this assay can detect is 138 copies/mL. A negative result does not preclude SARS-Cov-2 infection and should not be used as the sole basis for treatment or other patient management decisions. A negative result may occur with  improper specimen collection/handling, submission of specimen other than nasopharyngeal swab, presence of viral mutation(s) within the areas targeted by this assay, and inadequate number of viral copies(<138 copies/mL). A negative result must be combined with clinical observations, patient history, and epidemiological information. The expected result is Negative.  Fact Sheet for Patients:  BloggerCourse.com  Fact Sheet for Healthcare Providers:  SeriousBroker.it  This test is no t yet approved or cleared by the [place]  FDA and  has been authorized for detection and/or diagnosis of SARS-CoV-2 by FDA under an Emergency Use Authorization (EUA). This EUA will remain  in effect (meaning this test can be used) for the duration of the COVID-19 declaration under Section 564(b)(1) of the Act, 21 U.S.C.section 360bbb-3(b)(1), unless the authorization is terminated  or revoked sooner.       Influenza A by PCR NEGATIVE NEGATIVE   Influenza B by PCR NEGATIVE NEGATIVE    Comment: (NOTE) The Xpert Xpress SARS-CoV-2/FLU/RSV plus assay is intended as an aid in the diagnosis of influenza from Nasopharyngeal swab specimens and should not be used as a sole basis  for treatment. Nasal washings and aspirates are unacceptable for Xpert Xpress SARS-CoV-2/FLU/RSV testing.  Fact Sheet for Patients: BloggerCourse.com  Fact Sheet for Healthcare Providers: SeriousBroker.it  This test is not yet approved or cleared by the [place]  FDA and has been authorized for detection and/or diagnosis of SARS-CoV-2 by FDA under an Emergency Use Authorization (EUA). This EUA will remain in effect (meaning this test can be used) for the duration of the COVID-19 declaration under Section 564(b)(1) of the Act, 21 U.S.C. section 360bbb-3(b)(1), unless the authorization is terminated or revoked.     Resp Syncytial Virus by PCR NEGATIVE NEGATIVE    Comment: (NOTE) Fact Sheet for Patients: BloggerCourse.com  Fact Sheet for Healthcare Providers: SeriousBroker.it  This test is not yet approved or cleared by the [place]  FDA and has been authorized for detection and/or diagnosis of SARS-CoV-2 by FDA under an Emergency Use Authorization (EUA). This EUA will remain in effect (meaning this test can be used) for the duration of the COVID-19 declaration under Section 564(b)(1) of the Act, 21 U.S.C. section 360bbb-3(b)(1), unless the authorization is terminated or revoked.  Performed at [facility], [address] [place], [place] [address]   Hepatic function panel     Status: Abnormal   Collection Time: 02/14/24  4:20 AM  Result Value Ref Range   Total Protein 6.7 6.5 - 8.1 g/dL   Albumin 3.2 (L) 3.5 - 5.0 g/dL   AST 29 15 - 41 U/L   ALT 26 0 - 44 U/L   Alkaline Phosphatase 66 38 - 126 U/L   Total Bilirubin 0.4 0.0 - 1.2 mg/dL   Bilirubin, Direct 0.1 0.0 - 0.2 mg/dL   Indirect Bilirubin 0.3 0.3 - 0.9 mg/dL    Comment: Performed at [facility], [address] [place], [place] [address]  Lipase, blood     Status: None   Collection  Time: 02/14/24  4:20 AM  Result Value Ref Range   Lipase 49 11 - 51 U/L    Comment: Performed at [facility], [address] [place], [place] [address]  D-dimer, quantitative     Status: None   Collection Time: 02/14/24  4:20 AM  Result Value Ref Range   D-Dimer, Quant 0.34 0.00 - 0.50 ug/mL-FEU    Comment: (NOTE) At the manufacturer cut-off value of 0.5 g/mL FEU, this assay has a negative predictive value of 95-100%.This assay is intended for use in conjunction with a clinical pretest probability (PTP) assessment model to exclude pulmonary embolism (PE) and deep venous thrombosis (DVT) in outpatients suspected of PE or DVT. Results should be correlated with clinical presentation. Performed at [facility], [address] [place], [place] [address]  Troponin I (High Sensitivity)     Status: None   Collection Time: 02/14/24  5:50 AM  Result Value Ref Range   Troponin I (High Sensitivity) 3 <18 ng/L    Comment: (NOTE) Elevated high sensitivity troponin I (hsTnI) values and significant  changes across serial measurements may suggest ACS but many other  chronic and acute conditions are known to elevate hsTnI results.  Refer to the "Links" section for chest pain algorithms and additional  guidance. Performed at [facility], [address] [place], [place] [address]   Cytology - PAP     Status: None   Collection Time: 02/22/24  3:31 PM  Result Value Ref Range   High risk HPV Negative    Adequacy      Satisfactory for evaluation; transformation zone component PRESENT.   Diagnosis      - Negative for intraepithelial lesion or malignancy (NILM)   Comment Normal Reference Range HPV - Negative   POCT glycosylated hemoglobin (Hb A1C)     Status: Abnormal   Collection Time: 04/25/24  8:24 AM  Result Value Ref Range   Hemoglobin A1C 6.7 (A) 4.0 - 5.6 %   HbA1c POC (<> result, manual entry)     HbA1c, POC (prediabetic range)     HbA1c, POC  (controlled diabetic range)       PHQ2/9:    04/25/2024    8:20 AM 04/13/2024    8:09 AM 04/06/2024    1:06 PM 02/22/2024    3:05 PM 02/08/2024    2:53 PM  Depression screen PHQ 2/9  Decreased Interest 1 1  0   Down, Depressed, Hopeless 1 1  0   PHQ - 2 Score 2 2  0   Altered sleeping 0 0  0   Tired, decreased energy 3 3  0   Change in appetite 3 3  0   Feeling bad or failure about yourself  3 3  0   Trouble concentrating 2 2  0   Moving slowly or fidgety/restless 0 0  0   Suicidal thoughts 0 0  0   PHQ-9 Score 13 13  0   Difficult doing work/chores Somewhat difficult Somewhat difficult  Not difficult at all      Information is confidential and restricted. Go to Review Flowsheets to unlock data.    phq 9 is positive  Fall Risk:    04/25/2024    8:20 AM 02/04/2024    8:47 AM 11/11/2023   10:02 AM 10/08/2023   11:36 AM 08/07/2023    2:02 PM  Fall Risk   Falls in the past year? 0 0 0 0 0  Number falls in past yr: 0 0 0 0 0  Injury with Fall? 0 0 0 0 0  Risk for fall due to : No Fall Risks No Fall Risks No Fall Risks No Fall Risks No Fall Risks  Follow up Falls prevention discussed;Education provided;Falls evaluation completed Falls prevention discussed;Falls evaluation completed;Education provided Falls prevention discussed Falls prevention discussed Falls prevention discussed    Assessment & Plan Diabetes with neuropathy, dyslipidemia and HTN  Diabetes well-controlled, slight A1c increase. No neuropathy symptoms. Prefers increased Mounjaro  dosage. - Increase Mounjaro  to 12.5 mg. - Provide 28-month supply of Mounjaro . - Continue Metformin  at current dose. - Refill Metformin  prescription. - Encourage checking fasting blood sugar levels. - Continue Toujeo  at 30 units daily.  Hypertension Blood pressure well-controlled on current regimen. - Continue Losartan 40 mg daily. -  Provide 42-month supply of Losartan.  Dyslipidemia Not taking Lovaza  as prescribed. Statins and Zetia   not tolerated due to myalgia. - Instruct her to visit pharmacy to pick up Lovaza . - Continue Lovaza  2 g twice daily.  Morbid Obesity Weight loss from 260 lbs to 247 lbs. BMI remains over 40. Weight loss may be due to stress and medication.  Back pain Intermittent shooting pain likely due to physical activity. Managed with Tramadol, Baclofen , and Naproxen . - Consider using topical treatments like Biofreeze or Icy Hot.  Sleep apnea No recent episodes of gasping for air. CPAP titration study incomplete.  General Health Maintenance Colonoscopy and EEG up to date. Mammogram overdue. - Schedule mammogram.

## 2024-04-26 ENCOUNTER — Other Ambulatory Visit (HOSPITAL_COMMUNITY): Payer: Self-pay

## 2024-04-26 ENCOUNTER — Telehealth: Payer: Self-pay | Admitting: Pharmacy Technician

## 2024-04-26 NOTE — Telephone Encounter (Signed)
 Pharmacy Patient Advocate Encounter   Received notification from Onbase that prior authorization for Toujeo  SoloStar 300UNIT/ML pen-injectors is required/requested.   Insurance verification completed.   The patient is insured through [facility] .   Per test claim: Refill too soon. PA is not needed at this time. Medication was filled 04/20/24. Next eligible fill date is 05/10/24.

## 2024-04-27 ENCOUNTER — Ambulatory Visit

## 2024-04-27 ENCOUNTER — Encounter

## 2024-04-29 ENCOUNTER — Ambulatory Visit: Admitting: Family Medicine

## 2024-05-02 ENCOUNTER — Ambulatory Visit

## 2024-05-02 ENCOUNTER — Encounter

## 2024-05-02 DIAGNOSIS — Z419 Encounter for procedure for purposes other than remedying health state, unspecified: Secondary | ICD-10-CM | POA: Diagnosis not present

## 2024-05-04 ENCOUNTER — Ambulatory Visit

## 2024-05-04 ENCOUNTER — Encounter

## 2024-05-04 ENCOUNTER — Ambulatory Visit: Payer: 59 | Admitting: Family Medicine

## 2024-05-09 ENCOUNTER — Encounter

## 2024-05-11 ENCOUNTER — Encounter

## 2024-05-11 ENCOUNTER — Other Ambulatory Visit: Payer: Self-pay | Admitting: Psychiatry

## 2024-05-11 DIAGNOSIS — F320 Major depressive disorder, single episode, mild: Secondary | ICD-10-CM

## 2024-05-11 DIAGNOSIS — F419 Anxiety disorder, unspecified: Secondary | ICD-10-CM

## 2024-05-12 ENCOUNTER — Encounter

## 2024-05-17 ENCOUNTER — Encounter: Payer: Self-pay | Admitting: Psychiatry

## 2024-05-17 ENCOUNTER — Encounter

## 2024-05-17 ENCOUNTER — Telehealth: Admitting: Psychiatry

## 2024-05-17 DIAGNOSIS — F411 Generalized anxiety disorder: Secondary | ICD-10-CM | POA: Diagnosis not present

## 2024-05-17 DIAGNOSIS — F331 Major depressive disorder, recurrent, moderate: Secondary | ICD-10-CM

## 2024-05-17 MED ORDER — VILAZODONE HCL 10 MG PO TABS: 10.0000 mg | ORAL_TABLET | Freq: Every day | ORAL | 1 refills | Status: DC

## 2024-05-17 MED ORDER — HYDROXYZINE PAMOATE 25 MG PO CAPS
25.0000 mg | ORAL_CAPSULE | Freq: Every day | ORAL | 1 refills | Status: DC | PRN
Start: 2024-05-17 — End: 2024-06-21

## 2024-05-17 NOTE — Progress Notes (Signed)
 Virtual Visit via Video Note  I connected with Lindsay Mcdonald on 05/17/24 at 11:20 AM EDT by a video enabled telemedicine application and verified that I am speaking with the correct person using two identifiers.  Location Provider Location : [facility] Patient Location : Car  Participants: Patient , Provider   I discussed the limitations of evaluation and management by telemedicine and the availability of in person appointments. The patient expressed understanding and agreed to proceed.   I discussed the assessment and treatment plan with the patient. The patient was provided an opportunity to ask questions and all were answered. The patient agreed with the plan and demonstrated an understanding of the instructions.   The patient was advised to call back or seek an in-person evaluation if the symptoms worsen or if the condition fails to improve as anticipated.  BH MD OP Progress Note  05/17/2024 3:24 PM Lindsay Mcdonald  MRN:  [id number]  Chief Complaint:  Chief Complaint  Patient presents with   Follow-up   Anxiety   Depression   Medication Refill   Discussed the use of AI scribe software for clinical note transcription with the patient, who gave verbal consent to proceed.  History of Present Illness Lindsay Mcdonald is a 63 year old African-American female, currently employed, divorced, has a history of depression, chronic pain, diabetes mellitus, fibromyalgia, morbid obesity, hypertension, obstructive sleep apnea, hyperlipidemia was evaluated by telemedicine today.  She is experiencing significant stress and anxiety due to her mother's recent hospitalization and terminal illness. Her mother was admitted to the hospital on May 09, 2024, and has since been moved to a skilled nursing facility. She is actively involved in decisions regarding her mother's care, including potential hospice assessment and whether to maintain her mother's apartment. This situation has been emotionally taxing,  contributing to her stress and anxiety.  She is experiencing sleep disturbances, estimating about six hours of sleep per night, which she acknowledges may not be sufficient. She stays up late to manage various tasks and has been staying at her mother's place to pack belongings, further disrupting her sleep routine. She uses a combination of melatonin, Zyrtec, and a muscle relaxer to aid sleep, which usually helps.  She is currently taking Viibryd , having recently increased her dose from 10 mg to 20 mg. She ran out of the medication three to four days ago and has been taking 10 mg to extend her supply until she can refill it. She also takes hydroxyzine  25 mg daily, although it was intended as an as-needed medication. She is concerned about managing her medication regimen amidst her current stressors.  Denies thoughts of self-harm or harm to others, although she feels overwhelmed and occasionally wants to 'walk away' from her responsibilities. She is scheduled to see her therapist soon.    Visit Diagnosis:    ICD-10-CM   1. Major depressive disorder, recurrent episode, moderate (HCC)  F33.1 Vilazodone  HCl (VIIBRYD ) 10 MG TABS    2. Generalized anxiety disorder  F41.1 hydrOXYzine  (VISTARIL ) 25 MG capsule      Past Psychiatric History: I have reviewed past psychiatric history from progress note on 02/08/2024.  Past trials of medications include sertraline , duloxetine , Xanax , Lexapro , Prozac , nortriptyline.  Past Medical History:  Past Medical History:  Diagnosis Date   Anxiety    Arthritis 03/2020   osteoarthritis both knees   Chronic pain of right knee    Chronic sinusitis    DDD (degenerative disc disease), cervical    cervical radiculopathy  Depression    Diabetes mellitus    insulin  dependant   Fibromyalgia    GERD (gastroesophageal reflux disease)    High cholesterol    Hyperlipidemia    Hypertension    Insomnia    Muscle pain    Nasal polyp    Neuromuscular disorder (HCC)     diabetic neuropathy   Obesity    Palpitations    Tendonitis of knee, left 04/2020   received steroid injection   Vitamin D  deficiency    Vitamin D  deficiency     Past Surgical History:  Procedure Laterality Date   BREAST REDUCTION SURGERY     COLONOSCOPY WITH PROPOFOL  N/A 02/03/2023   Procedure: COLONOSCOPY WITH PROPOFOL ;  Surgeon: Luke Salaam, MD;  Location: [facility];  Service: Gastroenterology;  Laterality: N/A;   ETHMOIDECTOMY Right 05/16/2020   Procedure: ETHMOIDECTOMY;  Surgeon: Mellody Sprout, MD;  Location: [facility] ORS;  Service: ENT;  Laterality: Right;   EXCISION ORAL TUMOR N/A 05/16/2020   Procedure: EXCISION OF SOFT PALATE PAPILLOMA;  Surgeon: Mellody Sprout, MD;  Location: [facility] OR[facility];  Service: ENT;  Laterality: N/A;   FRONTAL SINUS EXPLORATION Right 05/16/2020   Procedure: FRONTAL SINUS EXPLORATION;  Surgeon: Mellody Sprout, MD;  Location: [facility] OR[facility];  Service: ENT;  Laterality: Right;   IMAGE GUIDED SINUS SURGERY N/A 05/16/2020   Procedure: IMAGE GUIDED SINUS SURGERY;  Surgeon: Mellody Sprout, MD;  Location: [facility];  Service: ENT;  Laterality: N/A;   MAXILLARY ANTROSTOMY Bilateral 05/16/2020   Procedure: MAXILLARY ANTROSTOMY with tissue removal;  Surgeon: Mellody Sprout, MD;  Location: [facility];  Service: ENT;  Laterality: Bilateral;   nasal endoscopic     OOPHORECTOMY  ?   one ovary removed    REDUCTION MAMMAPLASTY Bilateral 2000   removal of ovary     SPHENOIDECTOMY Right 05/16/2020   Procedure: SPHENOIDECTOMY;  Surgeon: Mellody Sprout, MD;  Location: [facility];  Service: ENT;  Laterality: Right;    Family Psychiatric History: I have reviewed family psychiatric history from progress note on 02/08/2024.  Family History:  Family History  Problem Relation Age of Onset   Dementia Mother    Diabetes Mother    Cancer Mother 72       Breast   Breast cancer Mother 85   Gout Mother    Diabetes Father    Breast cancer Maternal Aunt 26   Kidney cancer Maternal Aunt    Kidney  disease Maternal Aunt    Stroke Maternal Uncle    Alcohol abuse Daughter    Diabetes Daughter        Oldest Daughter   Alcohol abuse Daughter    Cancer Half-Sister 15       breast cancer   Prostate cancer Neg Hx    Bladder Cancer Neg Hx     Social History: I have reviewed social history from progress note on 02/08/2024. Social History   Socioeconomic History   Marital status: Divorced    Spouse name: Not on file   Number of children: 2   Years of education: Not on file   Highest education level: Some college, no degree  Occupational History   Occupation: Engineer, mining   Tobacco Use   Smoking status: Never   Smokeless tobacco: Never  Vaping Use   Vaping status: Former  Substance and Sexual Activity   Alcohol use: No    Alcohol/week: 0.0 standard drinks of alcohol   Drug use: No   Sexual activity: Not Currently    Partners:  Male    Birth control/protection: None  Other Topics Concern   Not on file  Social History Narrative   Divorced and he died since the divorce   Working at [employer] attorney.    Social Drivers of Health   Financial Resource Strain: Medium Risk (04/06/2024)   Overall Financial Resource Strain (CARDIA)    Difficulty of Paying Living Expenses: Somewhat hard  Food Insecurity: No Food Insecurity (04/06/2024)   Hunger Vital Sign    Worried About Running Out of Food in the Last Year: Never true    Ran Out of Food in the Last Year: Never true  Transportation Needs: No Transportation Needs (04/06/2024)   PRAPARE - Administrator, Civil Service (Medical): No    Lack of Transportation (Non-Medical): No  Physical Activity: Inactive (04/06/2024)   Exercise Vital Sign    Days of Exercise per Week: 0 days    Minutes of Exercise per Session: 0 min  Stress: Stress Concern Present (04/06/2024)   [employer] of Occupational Health - Occupational Stress Questionnaire    Feeling of Stress : Very much  Social Connections: Moderately Isolated  (04/06/2024)   Social Connection and Isolation Panel [NHANES]    Frequency of Communication with Friends and Family: More than three times a week    Frequency of Social Gatherings with Friends and Family: Never    Attends Religious Services: 1 to 4 times per year    Active Member of [employer] or Organizations: No    Attends Banker Meetings: Never    Marital Status: Divorced    Allergies:  Allergies  Allergen Reactions   Simvastatin     Muscle cramps    Trazodone  Hcl     It may have caused sob    Zetia  [Ezetimibe ] Other (See Comments)    Myalgia    Atorvastatin Other (See Comments)    muscle cramps   Compazine  Other (See Comments)    Twitching and spasms of neck muscles   Penicillins Rash    Reaction under 25, patient unsure that she still has an allergy    Metabolic Disorder Labs: Lab Results  Component Value Date   HGBA1C 6.7 (A) 04/25/2024   MPG 166 02/19/2022   MPG 146 04/09/2020   No results found for: "PROLACTIN" Lab Results  Component Value Date   CHOL 211 (H) 05/15/2023   TRIG 107 05/15/2023   HDL 56 05/15/2023   CHOLHDL 3.8 05/15/2023   VLDL 29 06/27/2016   LDLCALC 134 (H) 05/15/2023   LDLCALC 94 02/19/2022   Lab Results  Component Value Date   TSH 3.27 10/08/2023   TSH 3.00 02/22/2021    Therapeutic Level Labs: No results found for: "LITHIUM" No results found for: "VALPROATE" No results found for: "CBMZ"  Current Medications: Current Outpatient Medications  Medication Sig Dispense Refill   Vilazodone  HCl (VIIBRYD ) 10 MG TABS Take 1 tablet (10 mg total) by mouth daily. Take daily with 20 mg , total of 30 mg daily 30 tablet 1   ACCU-CHEK GUIDE TEST test strip USE THREE TIMES DAILY TO TEST BLOOD SUGAR. MORNING, NOON AND BEDTIME. 100 strip 0   Accu-Chek Softclix Lancets lancets USE TO TEST BLOOD SUGAR THREE TIMES DAILY. MORNING, NOON AND BEDTIME. 100 each 0   aspirin EC 81 MG tablet Take 81 mg by mouth daily. Swallow whole.     baclofen   (LIORESAL ) 10 MG tablet Take 10 mg by mouth at bedtime.     Cholecalciferol (VITAMIN  D) 50 MCG (2000 UT) CAPS Take 1 each by mouth daily at 12 noon.     estradiol -norethindrone (COMBIPATCH) 0.05-0.14 MG/DAY Place 1 patch onto the skin.     fluticasone  (FLONASE ) 50 MCG/ACT nasal spray Place 2 sprays into both nostrils daily as needed for allergies or rhinitis.     GVOKE HYPOPEN  2-PACK 1 MG/0.2ML SOAJ Inject 1 mg into the skin as needed (for hypoglycemia episodes (glucose below 60 and symptomatic)). 0.4 mL 1   hydrOXYzine  (VISTARIL ) 25 MG capsule Take 1 capsule (25 mg total) by mouth daily as needed for anxiety. 30 capsule 1   insulin  glargine, 1 Unit Dial, (TOUJEO  SOLOSTAR) 300 UNIT/ML Solostar Pen Inject 40-50 Units into the skin every morning. 4.5 mL 0   Insulin  Pen Needle 32G X 6 MM MISC 1 each by Does not apply route daily at 2 PM. 100 each 1   meclizine (ANTIVERT) 25 MG tablet      Melatonin 10 MG TABS Take 10 mg by mouth at bedtime as needed (sleep).      metFORMIN  (GLUCOPHAGE -XR) 750 MG 24 hr tablet Take 1 tablet (750 mg total) by mouth daily with breakfast. 90 tablet 0   naproxen  (NAPROSYN ) 500 MG tablet Take 1 tablet (500 mg total) by mouth 2 (two) times daily as needed. 14 tablet 0   olmesartan  (BENICAR ) 40 MG tablet Take 1 tablet (40 mg total) by mouth daily. 90 tablet 1   omega-3 acid ethyl esters (LOVAZA ) 1 g capsule Take 2 capsules (2 g total) by mouth 2 (two) times daily. 360 capsule 1   tirzepatide  (MOUNJARO ) 12.5 MG/0.5ML Pen Inject 12.5 mg into the skin once a week. 6 mL 0   traMADol (ULTRAM) 50 MG tablet Take 50 mg by mouth 2 (two) times daily as needed.     Vilazodone  HCl 20 MG TABS Take 1 tablet (20 mg total) by mouth daily. 30 tablet 0   No current facility-administered medications for this visit.     Musculoskeletal: Strength & Muscle Tone: UTA Gait & Station: Seated Patient leans: N/A  Psychiatric Specialty Exam: Review of Systems  Psychiatric/Behavioral:  Positive  for dysphoric mood and sleep disturbance. The patient is nervous/anxious.     There were no vitals taken for this visit.There is no height or weight on file to calculate BMI.  General Appearance: Fairly Groomed  Eye Contact:  Fair  Speech:  Clear and Coherent  Volume:  Normal  Mood:  Anxious and Depressed  Affect:  Congruent  Thought Process:  Goal Directed and Descriptions of Associations: Intact  Orientation:  Full (Time, Place, and Person)  Thought Content: Logical   Suicidal Thoughts:  No  Homicidal Thoughts:  No  Memory:  Immediate;   Fair Recent;   Fair Remote;   Fair  Judgement:  Fair  Insight:  Fair  Psychomotor Activity:  Normal  Concentration:  Concentration: Fair and Attention Span: Fair  Recall:  [employer] of Knowledge: Fair  Language: Fair  Akathisia:  No  Handed:  Right  AIMS (if indicated): not done  Assets:  Communication Skills Desire for Improvement Housing Social Support Transportation  ADL's:  Intact  Cognition: WNL  Sleep:  Poor   Screenings: GAD-7    Loss adjuster, chartered Office Visit from 04/25/2024 in [facility] Office Visit from 04/13/2024 in [facility] Counselor from 04/06/2024 in [facility] Office Visit from 02/22/2024 in [facility] Office  Visit from 02/08/2024 in [facility]  Total GAD-7 Score 20 20 20  0 7      PHQ2-9    Flowsheet Row Office Visit from 04/25/2024 in [facility] Office Visit from 04/13/2024 in [facility] Counselor from 04/06/2024 in [facility] Office Visit from 02/22/2024 in [facility] Office Visit from 02/08/2024 in [facility]  PHQ-2 Total Score 2 2 3  0 3  PHQ-9 Total Score 13 13 16  0 9      Flowsheet Row Video  Visit from 05/17/2024 in [facility] Video Visit from 04/13/2024 in [facility] Counselor from 04/06/2024 in [facility]  C-SSRS RISK CATEGORY No Risk Low Risk Moderate Risk        Assessment and Plan: Lindsay Mcdonald is a 63 year old African-American female, has a history of depression, anxiety was evaluated by telemedicine today.  Discussed assessment and plan as noted below.  Major depression-unstable Currently struggling with depression symptoms related to her mother's health issues and other situational stressors.  Has not been compliant with Viibryd  as prescribed recently had to reduce the dosage to 10 mg since she did not get a refill from the pharmacy. - Increase Viibryd  to 30 mg daily. - Encouraged to compliance with medication. - Continue psychotherapy sessions with Ms. Josiephine Nightingale.   GAD-unstable Anxiety symptoms due to current situational stressors. - Continue Hydroxyzine  25 mg as needed - Continue psychotherapy sessions. - Increase Viibryd  as noted above.   Collaboration of Care: Collaboration of Care: Referral or follow-up with counselor/therapist AEB encouraged to continue follow-up with therapist.  Patient/Guardian was advised Release of Information must be obtained prior to any record release in order to collaborate their care with an outside provider. Patient/Guardian was advised if they have not already done so to contact the registration department to sign all necessary forms in order for us  to release information regarding their care.   Consent: Patient/Guardian gives verbal consent for treatment and assignment of benefits for services provided during this visit. Patient/Guardian expressed understanding and agreed to proceed.  This note was generated in part or whole with voice recognition software. Voice recognition is usually quite accurate  but there are transcription errors that can and very often do occur. I apologize for any typographical errors that were not detected and corrected.     Glyndon Tursi, MD 05/17/2024, 3:24 PM

## 2024-05-19 ENCOUNTER — Encounter

## 2024-05-22 ENCOUNTER — Encounter: Payer: Self-pay | Admitting: Family Medicine

## 2024-05-25 ENCOUNTER — Ambulatory Visit (INDEPENDENT_AMBULATORY_CARE_PROVIDER_SITE_OTHER): Admitting: Professional Counselor

## 2024-05-25 DIAGNOSIS — F331 Major depressive disorder, recurrent, moderate: Secondary | ICD-10-CM

## 2024-05-25 DIAGNOSIS — F411 Generalized anxiety disorder: Secondary | ICD-10-CM

## 2024-05-25 NOTE — Progress Notes (Signed)
  THERAPIST PROGRESS NOTE  Session Time: 4:00 PM - 4:45 PM  Participation Level: Active  Behavioral Response: Well Groomed, Alert, Anxious and Dysphoric  Type of Therapy: Individual Therapy  Treatment Goals addressed: Active OP Depression  LTG: "I'd like to be able to make decisions and have clarity and feel good about my decisions. I'd like to also feel better with relationships. How to manage stress."     Start:  05/25/24    Expected End:  05/24/25     STG: "Stress management." To improve stress management AEB reduction in mood symptoms by practicing coping skills over the next 12 weeks   STG: "Self-care, self-love, I'm kind of hard on myself." To improve sense of self AEB self-report in increase of self-care activities and restructuring maladaptive patterns of thinking over the next 90 days.    STG: "I just want to be able to control that more. (Bringing up graphic images like traumatic things seen on TV). To reduce distress brought on by OCD tendencies AEB self-report reduction in obsessive and compulsive behaviors with exposure and CBT.    ProgressTowards Goals: Initial  Interventions: Motivational Interviewing, Supportive, and Other: Coping skills  Summary: Kanon Colunga is a 63 y.o. female who presents with a history of depression and anxiety. She appeared alert and oriented x5. She stated things have been tough with her mother. She has scheduled respite care this weekend. Versia engaged in developing her treatment plan. She was receptive to coping skills and practiced 5-4-3-2-1 during session. Daionna was in agreement to continue practicing skills to help reduce emotional vulnerability.  Therapist Response: Conducted session with Gregary Lean. Began session with check-in/update since previous session. Utilized empathetic and reflective listening. Developed treatment plan with input from Sledge on current strengths, needs, and progress towards goals. Provided psychoeducation on coping skills and  engaged Emoni in 5-4-3-2-1 grounding mechanism. Scheduled additional appointment and concluded session.   Suicidal/Homicidal: No  Plan: Return again in 2 weeks.  Diagnosis: Major depressive disorder, recurrent episode, moderate (HCC)  Generalized anxiety disorder  Collaboration of Care: Medication Management AEB chart review  Patient/Guardian was advised Release of Information must be obtained prior to any record release in order to collaborate their care with an outside provider. Patient/Guardian was advised if they have not already done so to contact the registration department to sign all necessary forms in order for us  to release information regarding their care.   Consent: Patient/Guardian gives verbal consent for treatment and assignment of benefits for services provided during this visit. Patient/Guardian expressed understanding and agreed to proceed.   Len Quale, [facility] 05/25/2024

## 2024-05-27 ENCOUNTER — Ambulatory Visit: Admitting: Family Medicine

## 2024-05-27 ENCOUNTER — Encounter: Payer: Self-pay | Admitting: Family Medicine

## 2024-05-27 VITALS — BP 136/82 | HR 86 | Resp 16 | Ht 64.0 in | Wt 246.2 lb

## 2024-05-27 DIAGNOSIS — E785 Hyperlipidemia, unspecified: Secondary | ICD-10-CM | POA: Diagnosis not present

## 2024-05-27 DIAGNOSIS — E1169 Type 2 diabetes mellitus with other specified complication: Secondary | ICD-10-CM | POA: Diagnosis not present

## 2024-05-27 DIAGNOSIS — R051 Acute cough: Secondary | ICD-10-CM

## 2024-05-27 MED ORDER — BENZONATATE 100 MG PO CAPS: 100.0000 mg | ORAL_CAPSULE | Freq: Three times a day (TID) | ORAL | 0 refills | Status: DC | PRN

## 2024-05-27 NOTE — Progress Notes (Signed)
 Name: Lindsay Mcdonald   MRN: [id number]    DOB: [birth year]   Date:05/27/2024       Progress Note  Subjective  Chief Complaint  Chief Complaint  Patient presents with   Form Completion   Discussed the use of AI scribe software for clinical note transcription with the patient, who gave verbal consent to proceed.  History of Present Illness Lindsay Mcdonald is a 63 year old female who presents for FMLA paperwork and evaluation of a sinus infection.  She requires six occurrences a month, each lasting three hours, to accommodate travel and potential illness. She wants to ensure the paperwork is correctly filled out for her needs.  She has a sinus infection and was prescribed a Z-Pak (250 mg) by an ENT specialist. 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 The cough is exacerbated by drainage, and she finds herself needing to run to the bathroom to cough.  She has a history of diabetes and dyslipidemia. She is currently taking Metformin  750 mg and Mounjaro  12.5 mg. Her A1c was recently noted to have increased slightly from 6.6 to 6.7. She experiences occasional hypoglycemic episodes, describing an incident at work where she felt shaky and had to consume chocolate. Stress affects her eating habits, particularly due to caregiving responsibilities for her mother.  She is the primary caregiver for her mother, who has dementia and heart issues. Her mother was discharged from the hospital and is now under hospice care at home. She and her daughter have been managing her care, with hospice involvement starting two weeks ago. Her mother is mainly bedridden and requires assistance with daily activities.  She mentions experiencing chest pain and reports significant stress related to her caregiving duties. She has scheduled a cardiology appointment to address this concern. Her psychiatrist is aware  of her stress levels, and she has a therapist appointment scheduled to further discuss her mental health.    Patient Active Problem List   Diagnosis Date Noted   Major depressive disorder, recurrent episode, moderate (HCC) 04/13/2024   Current mild episode of major depressive disorder without prior episode (HCC) 02/08/2024   Anxiety disorder 02/08/2024   Herniated intervertebral disc of lumbar spine 08/07/2023   B12 deficiency 05/15/2023   Occult blood positive stool 02/03/2023   Adenomatous polyp of colon 02/03/2023   Dyslipidemia associated with type 2 diabetes mellitus (HCC) 05/21/2021   Hypertension associated with diabetes (HCC) 08/31/2020   History of marijuana use 03/30/2019   Fibromyalgia 12/31/2018   Venous vascular malformations 03/27/2016   Seasonal allergic rhinitis 09/26/2015   Muscle spasms of neck 06/19/2015   Insomnia 06/19/2015   Primary osteoarthritis of right knee 06/17/2015   Depression, major, recurrent, mild (HCC) 06/17/2015   Hyperlipidemia 06/17/2015   Benign hypertension 06/17/2015   Morbid obesity with BMI of 40.0-44.9, adult (HCC) 06/17/2015   Vitamin D  deficiency 06/17/2015    Past Surgical History:  Procedure Laterality Date   BREAST REDUCTION SURGERY     COLONOSCOPY WITH PROPOFOL  N/A 02/03/2023   Procedure: COLONOSCOPY WITH PROPOFOL ;  Surgeon: Luke Salaam, MD;  Location: [facility] ENDOSCOPY;  Service: Gastroenterology;  Laterality: N/A;   ETHMOIDECTOMY Right 05/16/2020   Procedure: ETHMOIDECTOMY;  Surgeon: Mellody Sprout, MD;  Location: [facility] ORS;  Service: ENT;  Laterality: Right;   EXCISION ORAL TUMOR N/A 05/16/2020   Procedure: EXCISION OF SOFT PALATE PAPILLOMA;  Surgeon: Mellody Sprout, MD;  Location: [facility]  ORS;  Service: ENT;  Laterality: N/A;   FRONTAL SINUS EXPLORATION Right 05/16/2020   Procedure: FRONTAL SINUS EXPLORATION;  Surgeon: Juengel, Paul, MD;  Location: [facility] ORS;  Service: ENT;  Laterality: Right;   IMAGE GUIDED SINUS SURGERY N/A 05/16/2020    Procedure: IMAGE GUIDED SINUS SURGERY;  Surgeon: Mellody Sprout, MD;  Location: [facility] ORS;  Service: ENT;  Laterality: N/A;   MAXILLARY ANTROSTOMY Bilateral 05/16/2020   Procedure: MAXILLARY ANTROSTOMY with tissue removal;  Surgeon: Mellody Sprout, MD;  Location: [facility] ORS;  Service: ENT;  Laterality: Bilateral;   nasal endoscopic     OOPHORECTOMY  ?   one ovary removed    REDUCTION MAMMAPLASTY Bilateral 2000   removal of ovary     SPHENOIDECTOMY Right 05/16/2020   Procedure: SPHENOIDECTOMY;  Surgeon: Mellody Sprout, MD;  Location: [facility] ORS;  Service: ENT;  Laterality: Right;    Family History  Problem Relation Age of Onset   Dementia Mother    Diabetes Mother    Cancer Mother 44       Breast   Breast cancer Mother 68   Gout Mother    Diabetes Father    Breast cancer Maternal Aunt 74   Kidney cancer Maternal Aunt    Kidney disease Maternal Aunt    Stroke Maternal Uncle    Alcohol abuse Daughter    Diabetes Daughter        Oldest Daughter   Alcohol abuse Daughter    Cancer Half-Sister 42       breast cancer   Prostate cancer Neg Hx    Bladder Cancer Neg Hx     Social History   Tobacco Use   Smoking status: Never   Smokeless tobacco: Never  Substance Use Topics   Alcohol use: No    Alcohol/week: 0.0 standard drinks of alcohol     Current Outpatient Medications:    ACCU-CHEK GUIDE TEST test strip, USE THREE TIMES DAILY TO TEST BLOOD SUGAR. MORNING, NOON AND BEDTIME., Disp: 100 strip, Rfl: 0   Accu-Chek Softclix Lancets lancets, USE TO TEST BLOOD SUGAR THREE TIMES DAILY. MORNING, NOON AND BEDTIME., Disp: 100 each, Rfl: 0   aspirin EC 81 MG tablet, Take 81 mg by mouth daily. Swallow whole., Disp: , Rfl:    baclofen  (LIORESAL ) 10 MG tablet, Take 10 mg by mouth at bedtime., Disp: , Rfl:    Cholecalciferol (VITAMIN D ) 50 MCG (2000 UT) CAPS, Take 1 each by mouth daily at 12 noon., Disp: , Rfl:    estradiol -norethindrone (COMBIPATCH) 0.05-0.14 MG/DAY, Place 1 patch onto the skin.,  Disp: , Rfl:    fluticasone  (FLONASE ) 50 MCG/ACT nasal spray, Place 2 sprays into both nostrils daily as needed for allergies or rhinitis., Disp: , Rfl:    GVOKE HYPOPEN  2-PACK 1 MG/0.2ML SOAJ, Inject 1 mg into the skin as needed (for hypoglycemia episodes (glucose below 60 and symptomatic))., Disp: 0.4 mL, Rfl: 1   hydrOXYzine  (VISTARIL ) 25 MG capsule, Take 1 capsule (25 mg total) by mouth daily as needed for anxiety., Disp: 30 capsule, Rfl: 1   insulin  glargine, 1 Unit Dial, (TOUJEO  SOLOSTAR) 300 UNIT/ML Solostar Pen, Inject 40-50 Units into the skin every morning., Disp: 4.5 mL, Rfl: 0   Insulin  Pen Needle 32G X 6 MM MISC, 1 each by Does not apply route daily at 2 PM., Disp: 100 each, Rfl: 1   meclizine (ANTIVERT) 25 MG tablet, , Disp: , Rfl:    Melatonin 10 MG TABS, Take 10 mg by mouth  at bedtime as needed (sleep). , Disp: , Rfl:    metFORMIN  (GLUCOPHAGE -XR) 750 MG 24 hr tablet, Take 1 tablet (750 mg total) by mouth daily with breakfast., Disp: 90 tablet, Rfl: 0   naproxen  (NAPROSYN ) 500 MG tablet, Take 1 tablet (500 mg total) by mouth 2 (two) times daily as needed., Disp: 14 tablet, Rfl: 0   olmesartan  (BENICAR ) 40 MG tablet, Take 1 tablet (40 mg total) by mouth daily., Disp: 90 tablet, Rfl: 1   omega-3 acid ethyl esters (LOVAZA ) 1 g capsule, Take 2 capsules (2 g total) by mouth 2 (two) times daily., Disp: 360 capsule, Rfl: 1   tirzepatide  (MOUNJARO ) 12.5 MG/0.5ML Pen, Inject 12.5 mg into the skin once a week., Disp: 6 mL, Rfl: 0   traMADol (ULTRAM) 50 MG tablet, Take 50 mg by mouth 2 (two) times daily as needed., Disp: , Rfl:    Vilazodone  HCl (VIIBRYD ) 10 MG TABS, Take 1 tablet (10 mg total) by mouth daily. Take daily with 20 mg , total of 30 mg daily, Disp: 30 tablet, Rfl: 1   Vilazodone  HCl 20 MG TABS, Take 1 tablet (20 mg total) by mouth daily., Disp: 30 tablet, Rfl: 0  Allergies  Allergen Reactions   Simvastatin     Muscle cramps    Trazodone  Hcl     It may have caused sob    Zetia   [Ezetimibe ] Other (See Comments)    Myalgia    Atorvastatin Other (See Comments)    muscle cramps   Compazine  Other (See Comments)    Twitching and spasms of neck muscles   Penicillins Rash    Reaction under 25, patient unsure that she still has an allergy    I personally reviewed active problem list, medication list, allergies, family history with the patient/caregiver today.   ROS  Ten systems reviewed and is negative except as mentioned in HPI    Objective Physical Exam Constitutional: Patient appears well-developed and well-nourished. Obese  No distress.  HEENT: head atraumatic, normocephalic, pupils equal and reactive to light, neck supple Cardiovascular: Normal rate, regular rhythm and normal heart sounds.  No murmur heard. No BLE edema. Pulmonary/Chest: Effort normal and breath sounds normal. No respiratory distress. Abdominal: Soft.  There is no tenderness. Psychiatric: Patient has a normal mood and affect. behavior is normal. Judgment and thought content normal.    CONSTITUTIONAL: Patient appears well-developed and well-nourished.  No distress. HEENT: Head atraumatic, normocephalic, neck supple. CARDIOVASCULAR: Normal rate, regular rhythm and normal heart sounds.  No murmur heard. No BLE edema. PULMONARY: Effort normal and breath sounds normal. No respiratory distress. ABDOMINAL: There is no tenderness or distention. MUSCULOSKELETAL: Normal gait. Without gross motor or sensory deficit. PSYCHIATRIC: Patient has a normal mood and affect. behavior is normal. Judgment and thought content normal.  Vitals:   05/27/24 1516  BP: 136/82  Pulse: 86  Resp: 16  SpO2: 97%  Weight: 246 lb 3.2 oz (111.7 kg)  Height: 5\' 4"  (1.626 m)    Body mass index is 42.26 kg/m.  Recent Results (from the past 2160 hours)  POCT glycosylated hemoglobin (Hb A1C)     Status: Abnormal   Collection Time: 04/25/24  8:24 AM  Result Value Ref Range   Hemoglobin A1C 6.7 (A) 4.0 - 5.6 %   HbA1c  POC (<> result, manual entry)     HbA1c, POC (prediabetic range)     HbA1c, POC (controlled diabetic range)       PHQ2/9:    05/27/2024  3:15 PM 04/25/2024    8:20 AM 04/13/2024    8:09 AM 04/06/2024    1:06 PM 02/22/2024    3:05 PM  Depression screen PHQ 2/9  Decreased Interest 1 1 1   0  Down, Depressed, Hopeless 1 1 1   0  PHQ - 2 Score 2 2 2   0  Altered sleeping 0 0 0  0  Tired, decreased energy 3 3 3   0  Change in appetite 3 3 3   0  Feeling bad or failure about yourself  3 3 3   0  Trouble concentrating 2 2 2   0  Moving slowly or fidgety/restless 0 0 0  0  Suicidal thoughts 0 0 0  0  PHQ-9 Score 13 13 13   0  Difficult doing work/chores Somewhat difficult Somewhat difficult Somewhat difficult  Not difficult at all     Information is confidential and restricted. Go to Review Flowsheets to unlock data.    phq 9 is positive  Fall Risk:    05/27/2024    3:15 PM 04/25/2024    8:20 AM 02/04/2024    8:47 AM 11/11/2023   10:02 AM 10/08/2023   11:36 AM  Fall Risk   Falls in the past year? 0 0 0 0 0  Number falls in past yr: 0 0 0 0 0  Injury with Fall? 0 0 0 0 0  Risk for fall due to : No Fall Risks No Fall Risks No Fall Risks No Fall Risks No Fall Risks  Follow up Falls prevention discussed;Education provided;Falls evaluation completed Falls prevention discussed;Education provided;Falls evaluation completed Falls prevention discussed;Falls evaluation completed;Education provided Falls prevention discussed Falls prevention discussed      Assessment & Plan Sinus Infection Persistent cough likely due to post-nasal drip. Currently on Azithromycin . - Prescribe Tessalon Perles for cough, 1-2 capsules up to three times a day. - Recommend Mucinex  for mucus management.  Diabetes Mellitus with dyslipidemia  Slight increase in Hemoglobin A1c from 6.6 to 6.7. Occasional hypoglycemic episodes managed with snacks. Stress may impact dietary habits. - Order urine microalbumin test. - Order  lipid panel. - Order comprehensive metabolic panel. - Order CBC.  Caregiver Stress Significant stress due to caregiving responsibilities. Hospice care initiated for mother. Potential for short-term disability consideration if stress affects job performance. - Discuss stress management and potential short-term disability with psychiatrist. - Encourage communication with psychiatrist regarding stress and caregiving responsibilities.  FMLA Paperwork Clarified that illness-related absences require a separate visit and cannot be preemptively approved.  General Health Maintenance Routine health maintenance discussed. Due for updated labs. - Perform urine specimen collection for microalbumin. - Perform blood work for cholesterol, comprehensive metabolic panel, and CBC.

## 2024-05-28 LAB — CBC WITH DIFFERENTIAL/PLATELET
Absolute Lymphocytes: 3636 {cells}/uL (ref 850–3900)
Absolute Monocytes: 814 {cells}/uL (ref 200–950)
Basophils Absolute: 82 {cells}/uL (ref 0–200)
Basophils Relative: 0.8 %
Eosinophils Absolute: 453 {cells}/uL (ref 15–500)
Eosinophils Relative: 4.4 %
HCT: 38.8 % (ref 35.0–45.0)
Hemoglobin: 12.4 g/dL (ref 11.7–15.5)
MCH: 26.2 pg — ABNORMAL LOW (ref 27.0–33.0)
MCHC: 32 g/dL (ref 32.0–36.0)
MCV: 82 fL (ref 80.0–100.0)
MPV: 11.3 fL (ref 7.5–12.5)
Monocytes Relative: 7.9 %
Neutro Abs: 5315 {cells}/uL (ref 1500–7800)
Neutrophils Relative %: 51.6 %
Platelets: 278 10*3/uL (ref 140–400)
RBC: 4.73 10*6/uL (ref 3.80–5.10)
RDW: 12.7 % (ref 11.0–15.0)
Total Lymphocyte: 35.3 %
WBC: 10.3 10*3/uL (ref 3.8–10.8)

## 2024-05-28 LAB — MICROALBUMIN / CREATININE URINE RATIO
Creatinine, Urine: 164 mg/dL (ref 20–275)
Microalb Creat Ratio: 3 mg/g{creat} (ref <30)
Microalb, Ur: 0.5 mg/dL

## 2024-05-28 LAB — LIPID PANEL
Cholesterol: 183 mg/dL (ref <200)
HDL: 59 mg/dL (ref >=50)
LDL Cholesterol (Calc): 107 mg/dL — ABNORMAL HIGH
Non-HDL Cholesterol (Calc): 124 mg/dL (ref <130)
Total CHOL/HDL Ratio: 3.1 (calc) (ref <5.0)
Triglycerides: 83 mg/dL (ref <150)

## 2024-05-28 LAB — COMPREHENSIVE METABOLIC PANEL WITH GFR
AG Ratio: 1.3 (calc) (ref 1.0–2.5)
ALT: 19 U/L (ref 6–29)
AST: 22 U/L (ref 10–35)
Albumin: 3.9 g/dL (ref 3.6–5.1)
Alkaline phosphatase (APISO): 80 U/L (ref 37–153)
BUN: 14 mg/dL (ref 7–25)
CO2: 28 mmol/L (ref 20–32)
Calcium: 8.9 mg/dL (ref 8.6–10.4)
Chloride: 105 mmol/L (ref 98–110)
Creat: 0.82 mg/dL (ref 0.50–1.05)
Globulin: 2.9 g/dL (ref 1.9–3.7)
Glucose, Bld: 150 mg/dL — ABNORMAL HIGH (ref 65–99)
Potassium: 4.1 mmol/L (ref 3.5–5.3)
Sodium: 142 mmol/L (ref 135–146)
Total Bilirubin: 0.3 mg/dL (ref 0.2–1.2)
Total Protein: 6.8 g/dL (ref 6.1–8.1)
eGFR: 82 mL/min/{1.73_m2} (ref >=60)

## 2024-05-30 ENCOUNTER — Ambulatory Visit: Payer: Self-pay | Admitting: Family Medicine

## 2024-05-31 ENCOUNTER — Ambulatory Visit: Admitting: Cardiology

## 2024-05-31 ENCOUNTER — Telehealth: Payer: Self-pay | Admitting: Psychiatry

## 2024-05-31 NOTE — Telephone Encounter (Signed)
 I have left 2 message for pt to return call

## 2024-05-31 NOTE — Telephone Encounter (Signed)
 Attempted to contact patient back to discuss her concerns as well as discussed the need for short-term leave.  Had to leave a voicemail to call the office back.

## 2024-05-31 NOTE — Telephone Encounter (Signed)
 Pt called back she states that she wanted to know if you would do a short term for a few weeks [employer].

## 2024-06-02 DIAGNOSIS — Z419 Encounter for procedure for purposes other than remedying health state, unspecified: Secondary | ICD-10-CM | POA: Diagnosis not present

## 2024-06-08 ENCOUNTER — Ambulatory Visit: Admitting: Cardiology

## 2024-06-09 ENCOUNTER — Telehealth (HOSPITAL_COMMUNITY): Payer: Self-pay | Admitting: Psychiatry

## 2024-06-09 ENCOUNTER — Telehealth (INDEPENDENT_AMBULATORY_CARE_PROVIDER_SITE_OTHER): Admitting: Psychiatry

## 2024-06-09 ENCOUNTER — Encounter: Payer: Self-pay | Admitting: Psychiatry

## 2024-06-09 DIAGNOSIS — Z636 Dependent relative needing care at home: Secondary | ICD-10-CM | POA: Diagnosis not present

## 2024-06-09 DIAGNOSIS — F331 Major depressive disorder, recurrent, moderate: Secondary | ICD-10-CM | POA: Diagnosis not present

## 2024-06-09 DIAGNOSIS — F411 Generalized anxiety disorder: Secondary | ICD-10-CM

## 2024-06-09 MED ORDER — VILAZODONE HCL 20 MG PO TABS: 20.0000 mg | ORAL_TABLET | Freq: Every day | ORAL | 1 refills | Status: DC

## 2024-06-09 NOTE — Telephone Encounter (Signed)
 D:  Pt called the MH-IOP Case Mgr back.  A:  Oriented pt and answered her questions.  Pt states she will need to clarify some things with HR before making her decision about attending group.  Pt confirmed she has [employer] also.  Encouraged pt to call the case manager back once she speaks to HR.  Inform Dr. Tere Felts.  R:  Pt receptive.

## 2024-06-09 NOTE — Progress Notes (Unsigned)
 Virtual Visit via Video Note  I connected with Lindsay Mcdonald on 06/09/24 at  8:30 AM EDT by a video enabled telemedicine application and verified that I am speaking with the correct person using two identifiers.  Location Provider Location : [facility] Patient Location : Car  Participants: Patient , Provider   I discussed the limitations of evaluation and management by telemedicine and the availability of in person appointments. The patient expressed understanding and agreed to proceed.   I discussed the assessment and treatment plan with the patient. The patient was provided an opportunity to ask questions and all were answered. The patient agreed with the plan and demonstrated an understanding of the instructions.   The patient was advised to call back or seek an in-person evaluation if the symptoms worsen or if the condition fails to improve as anticipated.   BH MD OP Progress Note  06/10/2024 6:55 AM Lindsay Mcdonald  MRN:  [id number]  Chief Complaint:  Chief Complaint  Patient presents with   Follow-up   Anxiety   Depression   Medication Refill   Discussed the use of AI scribe software for clinical note transcription with the patient, who gave verbal consent to proceed.  History of Present Illness Lindsay Mcdonald is a 63 year old African-American female, currently unemployed, divorced, has a history of depression, chronic pain, diabetes mellitus, fibromyalgia, morbid obesity, hypertension, obstructive sleep apnea, hyperlipidemia was evaluated by telemedicine today.  She has a history of major depression and generalized anxiety and is currently in therapy with Ms.Elizabeth Ganey. Her therapy is ongoing, and she has a video chat scheduled for the following day. Her medication regimen includes Viibryd , which was increased to 30 mg in May, but she has been alternating between 20 mg and 30 mg due to feeling anxious on the higher dose. She also takes hydroxyzine .  Although she is  currently on medications for management of depression and anxiety she continues to struggle with low mood, increased anxiety, concentration problems, physical exhaustion on a regular basis.  She believes she has not been getting a lot of time for self-care and she is worried about her mother who is currently ill which is all contributing to her current symptoms.  She experiences chest pain, which she associates with stress. She has attempted to schedule an appointment for cardiology workup.  She reports appetite is fair however has been eating a lot of junk food and does not follow a balanced diet at this time.  She completed a course of antibiotics, azithromycin  for a sinus infection and is currently better with that.  She currently denies any suicidality, homicidality or perceptual disturbances.  Visit Diagnosis:    ICD-10-CM   1. Major depressive disorder, recurrent episode, moderate (HCC)  F33.1     2. Generalized anxiety disorder  F41.1 Vilazodone  HCl 20 MG TABS    3. Caregiver burden  Z63.6       Past Psychiatric History: I have reviewed past psychiatric history from progress note on 02/08/2024.  Past trials of medications include sertraline , duloxetine , Xanax , Lexapro , Prozac , nortriptyline  Past Medical History:  Past Medical History:  Diagnosis Date   Anxiety    Arthritis 03/2020   osteoarthritis both knees   Chronic pain of right knee    Chronic sinusitis    DDD (degenerative disc disease), cervical    cervical radiculopathy   Depression    Diabetes mellitus    insulin  dependant   Fibromyalgia    GERD (gastroesophageal reflux disease)  High cholesterol    Hyperlipidemia    Hypertension    Insomnia    Muscle pain    Nasal polyp    Neuromuscular disorder (HCC)    diabetic neuropathy   Obesity    Palpitations    Tendonitis of knee, left 04/2020   received steroid injection   Vitamin D  deficiency    Vitamin D  deficiency     Past Surgical History:  Procedure  Laterality Date   BREAST REDUCTION SURGERY     COLONOSCOPY WITH PROPOFOL  N/A 02/03/2023   Procedure: COLONOSCOPY WITH PROPOFOL ;  Surgeon: Luke Salaam, MD;  Location: [facility] ENDOSCOPY;  Service: Gastroenterology;  Laterality: N/A;   ETHMOIDECTOMY Right 05/16/2020   Procedure: ETHMOIDECTOMY;  Surgeon: Mellody Sprout, MD;  Location: [facility] ORS;  Service: ENT;  Laterality: Right;   EXCISION ORAL TUMOR N/A 05/16/2020   Procedure: EXCISION OF SOFT PALATE PAPILLOMA;  Surgeon: Mellody Sprout, MD;  Location: [facility] ORS;  Service: ENT;  Laterality: N/A;   FRONTAL SINUS EXPLORATION Right 05/16/2020   Procedure: FRONTAL SINUS EXPLORATION;  Surgeon: Mellody Sprout, MD;  Location: [facility] ORS;  Service: ENT;  Laterality: Right;   IMAGE GUIDED SINUS SURGERY N/A 05/16/2020   Procedure: IMAGE GUIDED SINUS SURGERY;  Surgeon: Mellody Sprout, MD;  Location: [facility] ORS;  Service: ENT;  Laterality: N/A;   MAXILLARY ANTROSTOMY Bilateral 05/16/2020   Procedure: MAXILLARY ANTROSTOMY with tissue removal;  Surgeon: Mellody Sprout, MD;  Location: [facility] ORS;  Service: ENT;  Laterality: Bilateral;   nasal endoscopic     OOPHORECTOMY  ?   one ovary removed    REDUCTION MAMMAPLASTY Bilateral 2000   removal of ovary     SPHENOIDECTOMY Right 05/16/2020   Procedure: SPHENOIDECTOMY;  Surgeon: Mellody Sprout, MD;  Location: [facility] ORS;  Service: ENT;  Laterality: Right;    Family Psychiatric History: I have reviewed family psychiatric history from progress note on 02/08/2024.  Family History:  Family History  Problem Relation Age of Onset   Dementia Mother    Diabetes Mother    Cancer Mother 26       Breast   Breast cancer Mother 48   Gout Mother    Diabetes Father    Breast cancer Maternal Aunt 63   Kidney cancer Maternal Aunt    Kidney disease Maternal Aunt    Stroke Maternal Uncle    Alcohol abuse Daughter    Diabetes Daughter        Oldest Daughter   Alcohol abuse Daughter    Cancer Half-Sister 15       breast cancer   Prostate  cancer Neg Hx    Bladder Cancer Neg Hx     Social History: I have reviewed social history from progress note on 02/08/2024. Social History   Socioeconomic History   Marital status: Divorced    Spouse name: Not on file   Number of children: 2   Years of education: Not on file   Highest education level: Some college, no degree  Occupational History   Occupation: Engineer, mining   Tobacco Use   Smoking status: Never   Smokeless tobacco: Never  Vaping Use   Vaping status: Former  Substance and Sexual Activity   Alcohol use: No    Alcohol/week: 0.0 standard drinks of alcohol   Drug use: No   Sexual activity: Not Currently    Partners: Male    Birth control/protection: None  Other Topics Concern   Not on file  Social History Narrative   Divorced and  he died since the divorce   Working at Patent attorney.    Social Drivers of Health   Financial Resource Strain: Medium Risk (04/06/2024)   Overall Financial Resource Strain (CARDIA)    Difficulty of Paying Living Expenses: Somewhat hard  Food Insecurity: No Food Insecurity (04/06/2024)   Hunger Vital Sign    Worried About Running Out of Food in the Last Year: Never true    Ran Out of Food in the Last Year: Never true  Transportation Needs: No Transportation Needs (04/06/2024)   PRAPARE - Administrator, Civil Service (Medical): No    Lack of Transportation (Non-Medical): No  Physical Activity: Inactive (04/06/2024)   Exercise Vital Sign    Days of Exercise per Week: 0 days    Minutes of Exercise per Session: 0 min  Stress: Stress Concern Present (04/06/2024)   [employer] of Occupational Health - Occupational Stress Questionnaire    Feeling of Stress : Very much  Social Connections: Moderately Isolated (04/06/2024)   Social Connection and Isolation Panel    Frequency of Communication with Friends and Family: More than three times a week    Frequency of Social Gatherings with Friends and Family: Never     Attends Religious Services: 1 to 4 times per year    Active Member of [employer] or Organizations: No    Attends Banker Meetings: Never    Marital Status: Divorced    Allergies:  Allergies  Allergen Reactions   Simvastatin     Muscle cramps    Trazodone  Hcl     It may have caused sob    Zetia  [Ezetimibe ] Other (See Comments)    Myalgia    Atorvastatin Other (See Comments)    muscle cramps   Compazine  Other (See Comments)    Twitching and spasms of neck muscles   Penicillins Rash    Reaction under 25, patient unsure that she still has an allergy    Metabolic Disorder Labs: Lab Results  Component Value Date   HGBA1C 6.7 (A) 04/25/2024   MPG 166 02/19/2022   MPG 146 04/09/2020   No results found for: PROLACTIN Lab Results  Component Value Date   CHOL 183 05/27/2024   TRIG 83 05/27/2024   HDL 59 05/27/2024   CHOLHDL 3.1 05/27/2024   VLDL 29 06/27/2016   LDLCALC 107 (H) 05/27/2024   LDLCALC 134 (H) 05/15/2023   Lab Results  Component Value Date   TSH 3.27 10/08/2023   TSH 3.00 02/22/2021    Therapeutic Level Labs: No results found for: LITHIUM No results found for: VALPROATE No results found for: CBMZ  Current Medications: Current Outpatient Medications  Medication Sig Dispense Refill   azithromycin  (ZITHROMAX ) 250 MG tablet Take 250 mg by mouth as directed.     ACCU-CHEK GUIDE TEST test strip USE THREE TIMES DAILY TO TEST BLOOD SUGAR. MORNING, NOON AND BEDTIME. 100 strip 0   Accu-Chek Softclix Lancets lancets USE TO TEST BLOOD SUGAR THREE TIMES DAILY. MORNING, NOON AND BEDTIME. 100 each 0   aspirin EC 81 MG tablet Take 81 mg by mouth daily. Swallow whole.     baclofen  (LIORESAL ) 10 MG tablet Take 10 mg by mouth at bedtime.     benzonatate  (TESSALON ) 100 MG capsule Take 1-2 capsules (100-200 mg total) by mouth 3 (three) times daily as needed for cough. 40 capsule 0   Cholecalciferol (VITAMIN D ) 50 MCG (2000 UT) CAPS Take 1 each by mouth daily  at 12  noon.     estradiol -norethindrone (COMBIPATCH) 0.05-0.14 MG/DAY Place 1 patch onto the skin.     fluticasone  (FLONASE ) 50 MCG/ACT nasal spray Place 2 sprays into both nostrils daily as needed for allergies or rhinitis.     GVOKE HYPOPEN  2-PACK 1 MG/0.2ML SOAJ Inject 1 mg into the skin as needed (for hypoglycemia episodes (glucose below 60 and symptomatic)). 0.4 mL 1   hydrOXYzine  (VISTARIL ) 25 MG capsule Take 1 capsule (25 mg total) by mouth daily as needed for anxiety. 30 capsule 1   insulin  glargine, 1 Unit Dial, (TOUJEO  SOLOSTAR) 300 UNIT/ML Solostar Pen Inject 40-50 Units into the skin every morning. 4.5 mL 0   Insulin  Pen Needle 32G X 6 MM MISC 1 each by Does not apply route daily at 2 PM. 100 each 1   meclizine (ANTIVERT) 25 MG tablet      Melatonin 10 MG TABS Take 10 mg by mouth at bedtime as needed (sleep).      metFORMIN  (GLUCOPHAGE -XR) 750 MG 24 hr tablet Take 1 tablet (750 mg total) by mouth daily with breakfast. 90 tablet 0   naproxen  (NAPROSYN ) 500 MG tablet Take 1 tablet (500 mg total) by mouth 2 (two) times daily as needed. 14 tablet 0   olmesartan  (BENICAR ) 40 MG tablet Take 1 tablet (40 mg total) by mouth daily. 90 tablet 1   omega-3 acid ethyl esters (LOVAZA ) 1 g capsule Take 2 capsules (2 g total) by mouth 2 (two) times daily. 360 capsule 1   tirzepatide  (MOUNJARO ) 12.5 MG/0.5ML Pen Inject 12.5 mg into the skin once a week. 6 mL 0   traMADol (ULTRAM) 50 MG tablet Take 50 mg by mouth 2 (two) times daily as needed.     Vilazodone  HCl (VIIBRYD ) 10 MG TABS Take 1 tablet (10 mg total) by mouth daily. Take daily with 20 mg , total of 30 mg daily 30 tablet 1   Vilazodone  HCl 20 MG TABS Take 1 tablet (20 mg total) by mouth daily. 30 tablet 1   No current facility-administered medications for this visit.     Musculoskeletal: Strength & Muscle Tone: UTA Gait & Station: Seated Patient leans: N/A  Psychiatric Specialty Exam: Review of Systems  Psychiatric/Behavioral:   Positive for dysphoric mood. The patient is nervous/anxious.     There were no vitals taken for this visit.There is no height or weight on file to calculate BMI.  General Appearance: Fairly Groomed  Eye Contact:  Fair  Speech:  Normal Rate  Volume:  Normal  Mood:  Anxious and Depressed  Affect:  Congruent  Thought Process:  Goal Directed and Descriptions of Associations: Intact  Orientation:  Full (Time, Place, and Person)  Thought Content: Logical   Suicidal Thoughts:  No  Homicidal Thoughts:  No  Memory:  Immediate;   Fair Recent;   Fair Remote;   Fair  Judgement:  Fair  Insight:  Fair  Psychomotor Activity:  Normal  Concentration:  Concentration: Fair and Attention Span: Fair  Recall:  [employer] of Knowledge: Fair  Language: Fair  Akathisia:  No  Handed:  Right  AIMS (if indicated): not done  Assets:  Communication Skills Desire for Improvement Housing Social Support Transportation  ADL's:  Intact  Cognition: WNL  Sleep:  Fair   Screenings: GAD-7    Garment/textile technologist Visit from 05/27/2024 in [facility] Office Visit from 04/25/2024 in [facility] Visit from 04/13/2024 in [facility] from 04/06/2024 in [facility] Office Visit from 02[facility]22/2024 in [facility]  Total GAD-7 Score 21 20 20 20  0   PHQ2-9    Flowsheet Row Office Visit from 05/27/2024 in [facility] Office Visit from 04/25/2024 in [facility] Office Visit from 04/13/2024 in [facility] Counselor from 04/06/2024 in [facility] Office Visit from 02/22/2024 in [facility]  PHQ-2 Total Score 2 2 2 3  0  PHQ-9 Total Score 13 13 13 16  0   Flowsheet Row Video Visit from 06/09/2024 in [facility] Video Visit from 05/17/2024 in [facility] Video Visit from 04/13/2024 in [facility]  C-SSRS RISK CATEGORY No Risk No Risk Low Risk     Assessment and Plan: Lindsay Mcdonald is a 63 year old African-American female who has a history of depression, anxiety was evaluated by telemedicine today.  She is currently struggling with depression, anxiety symptoms as well as psychosocial stressors of her mother who is currently struggling being the caregiver for her mother, discussed assessment and plan as noted below.  Major depression-unstable Continues to struggle with depression symptoms, exacerbated by her psychosocial stressors including her mother's health issue.  Viibryd  30 mg has been causing anxiety symptoms unknown if this is due to medication.  Hence she has been alternating 30 mg and 20 mg and just started this dose this week. Continue Viibryd  30 mg daily with 20 mg as reported. ( Will give it more time) Continue psychotherapy sessions with Ms. Josiephine Nightingale.  GAD-unstable Continues to have anxiety symptoms mostly exacerbated by situational stressors. Continue Hydroxyzine  25 mg daily as needed. Continue psychotherapy sessions  Caregiver Burden - Unstable Currently struggling with care giver burn out symptoms from being the primary caregiver for her mother although her mother is currently in the nursing home.  Reports physical exhaustion as well as mood related symptoms.  She is motivated to stay in therapy and also reports she has been in contact with another therapist through a virtual platform - teledoc for further support. Continue psychotherapy sessions with Ms. Deetta Farrow Discussed with patient she could either be sent for intensive outpatient program for more support and the program will complete an FMLA continuous to attend the program or this provider could support intermittent FMLA  allowing her to take time off when needed as well as for attending appointments and psychotherapy sessions.  Patient agrees to check with her company and let this provider know. I have sent a referral to [facility] health intensive outpatient program since patient requested more information.   Collaboration of Care: Collaboration of Care: Referral or follow-up with counselor/therapist AEB encouraged to continue psychotherapy sessions as well as has referred this patient to [facility] health intensive outpatient program. Patient also advised to follow up with primary care provider/cardiology for chest pain evaluation and workup.  Patient/Guardian was advised Release of Information must be obtained prior to any record release in order to collaborate their care with an outside provider. Patient/Guardian was advised if they have not already done so to contact the registration department to sign all necessary forms in order for us  to release information regarding their care.   Consent: Patient/Guardian gives verbal consent for treatment and assignment of benefits for services provided during this visit. Patient/Guardian expressed understanding and agreed to proceed.   This  note was generated in part or whole with voice recognition software. Voice recognition is usually quite accurate but there are transcription errors that can and very often do occur. I apologize for any typographical errors that were not detected and corrected.    Emonte Dieujuste, MD 06/10/2024, 6:55 AM

## 2024-06-09 NOTE — Telephone Encounter (Signed)
 D:  Dr. Tere Felts referred pt to virtual MH-IOP.  A:  Placed call to orient pt, but there was no answer.  Left case manager's name and phone # and requested pt to call cm back.  Inform Dr. Tere Felts.

## 2024-06-10 ENCOUNTER — Ambulatory Visit (INDEPENDENT_AMBULATORY_CARE_PROVIDER_SITE_OTHER): Admitting: Professional Counselor

## 2024-06-10 DIAGNOSIS — Z636 Dependent relative needing care at home: Secondary | ICD-10-CM | POA: Insufficient documentation

## 2024-06-10 DIAGNOSIS — F411 Generalized anxiety disorder: Secondary | ICD-10-CM

## 2024-06-10 DIAGNOSIS — F331 Major depressive disorder, recurrent, moderate: Secondary | ICD-10-CM

## 2024-06-10 NOTE — Progress Notes (Unsigned)
  THERAPIST PROGRESS NOTE  Virtual Visit via Video Note  I connected with Lindsay Mcdonald on 06/10/24 at  8:00 AM EDT by a video enabled telemedicine application and verified that I am speaking with the correct person using two identifiers.  Location: Patient: *** Provider: ***   I discussed the limitations of evaluation and management by telemedicine and the availability of in person appointments. The patient expressed understanding and agreed to proceed.   I discussed the assessment and treatment plan with the patient. The patient was provided an opportunity to ask questions and all were answered. The patient agreed with the plan and demonstrated an understanding of the instructions.   The patient was advised to call back or seek an in-person evaluation if the symptoms worsen or if the condition fails to improve as anticipated.  I provided 47 minutes of non-face-to-face time during this encounter. Len Quale, [facility]  Session Time: 8:02 AM - 8:49 AM   Participation Level: Active  Behavioral Response: CasualAlertAnxious and Dysphoric  Type of Therapy: Individual Therapy  Treatment Goals addressed: ***  ProgressTowards Goals: Progressing  Interventions: CBT, Motivational Interviewing, and Supportive  Summary: Lindsay Mcdonald is a 63 y.o. female who presents with ***. Stressed and depressed, mom back in nursing home, rumination about past  Therapist Response: Conducted session with . Began session with check-in/update since previous session. Utilized empathetic and reflective listening. Scheduled additional appointment and concluded session. Briefly discuss ptsd, encourage practice and provided tips to help, donut exercise, change sorry to thank you  Suicidal/Homicidal: No  Plan: Return again in *** weeks.  Diagnosis: No diagnosis found.  Collaboration of Care: Medication Management AEB chart review  Patient/Guardian was advised Release of Information must be  obtained prior to any record release in order to collaborate their care with an outside provider. Patient/Guardian was advised if they have not already done so to contact the registration department to sign all necessary forms in order for us  to release information regarding their care.   Consent: Patient/Guardian gives verbal consent for treatment and assignment of benefits for services provided during this visit. Patient/Guardian expressed understanding and agreed to proceed.   Len Quale, [facility] 06/10/2024

## 2024-06-13 ENCOUNTER — Encounter: Payer: Self-pay | Admitting: Family Medicine

## 2024-06-16 ENCOUNTER — Telehealth: Admitting: Psychiatry

## 2024-06-16 NOTE — Progress Notes (Signed)
  Cardiology Office Note   Date:  06/17/2024  ID:  Lindsay Mcdonald, DOB 08-04-1961, MRN [id number] PCP: Glenard Mire, MD  [facility] Providers Cardiologist:  None   History of Present Illness Lindsay Mcdonald is a 63 y.o. female with a h/o HTN, HLD, diabetes, obesity who presents for follow-up for chest pain.   The patient was seen as a new patient in 2023 after an ER visit for chest pain. Echo showed normal LVEF 60-65%, no WMA.   Today, the patient reports she had chest pain 3 weeks ago. It occurred on the left left side while laying down and felt like radiation. It lasted 15 mintues. She had no associated SOB, N/V. Has had 2 more episodes since then, but not as strong. She is under stress as she is the only caregiver for her mother. She also reports her mother recently had an MI. She denies exertional symptoms. She denies lower leg edema, lightheadedness or dizziness. She has palpitations when she has a panic attack.   Studies Reviewed EKG Interpretation Date/Time:  Friday June 17 2024 08:40:52 EDT Ventricular Rate:  80 PR Interval:  178 QRS Duration:  94 QT Interval:  370 QTC Calculation: 426 R Axis:   -11  Text Interpretation: Normal sinus rhythm Possible Left atrial enlargement Incomplete right bundle branch block Left ventricular hypertrophy ( R in aVL , Cornell product ) When compared with ECG of 14-Feb-2024 03:17, No significant change was found Confirmed by Franchester, Greysin Medlen ([id number]) on 06/17/2024 8:54:03 AM    Echo 02/2022 1. Left ventricular ejection fraction, by estimation, is 60 to 65%. The  left ventricle has normal function. The left ventricle has no regional  wall motion abnormalities. Left ventricular diastolic parameters were  normal.   2. Right ventricular systolic function is normal. The right ventricular  size is normal.   3. The mitral valve is normal in structure. No evidence of mitral valve  regurgitation.   4. The aortic valve is tricuspid. Aortic  valve regurgitation is not  visualized.   5. The inferior vena cava is normal in size with greater than 50%  respiratory variability, suggesting right atrial pressure of 3 mmHg.        Physical Exam VS:  BP 138/84 (BP Location: Left Arm, Patient Position: Sitting)   Pulse 80   Ht 5' 4 (1.626 m)   Wt 244 lb (110.7 kg)   SpO2 99%   BMI 41.88 kg/m        Wt Readings from Last 3 Encounters:  06/17/24 244 lb (110.7 kg)  05/27/24 246 lb 3.2 oz (111.7 kg)  04/25/24 247 lb (112 kg)    GEN: Well nourished, well developed in no acute distress NECK: No JVD; No carotid bruits CARDIAC: RRR, no murmurs, rubs, gallops RESPIRATORY:  Clear to auscultation without rales, wheezing or rhonchi  ABDOMEN: Soft, non-tender, non-distended EXTREMITIES:  No edema; No deformity   ASSESSMENT AND PLAN  Atypical chest pain Patient dscribes more atypical chest pain, however she does have RF and mother recently had an MI.echo in 2023 was normal. I will order Cardiac CTA. Lifestyle changes also discussed. Continue ASA.   HTN BP reasonable. Continue Olmesartan .   DM2 Most recent A1C 6.2        Dispo: Follow-up in 1 month  Signed, Jackline Castilla VEAR Franchester, PA-C

## 2024-06-17 ENCOUNTER — Encounter: Payer: Self-pay | Admitting: Medical

## 2024-06-17 ENCOUNTER — Ambulatory Visit: Attending: Medical | Admitting: Medical

## 2024-06-17 ENCOUNTER — Other Ambulatory Visit: Payer: Self-pay

## 2024-06-17 ENCOUNTER — Telehealth: Payer: Self-pay

## 2024-06-17 VITALS — BP 138/84 | HR 80 | Ht 64.0 in | Wt 244.0 lb

## 2024-06-17 DIAGNOSIS — R072 Precordial pain: Secondary | ICD-10-CM | POA: Diagnosis not present

## 2024-06-17 DIAGNOSIS — Z794 Long term (current) use of insulin: Secondary | ICD-10-CM

## 2024-06-17 DIAGNOSIS — E118 Type 2 diabetes mellitus with unspecified complications: Secondary | ICD-10-CM | POA: Diagnosis not present

## 2024-06-17 DIAGNOSIS — R079 Chest pain, unspecified: Secondary | ICD-10-CM

## 2024-06-17 DIAGNOSIS — M545 Low back pain, unspecified: Secondary | ICD-10-CM

## 2024-06-17 DIAGNOSIS — I10 Essential (primary) hypertension: Secondary | ICD-10-CM | POA: Diagnosis not present

## 2024-06-17 DIAGNOSIS — M5126 Other intervertebral disc displacement, lumbar region: Secondary | ICD-10-CM

## 2024-06-17 MED ORDER — METOPROLOL TARTRATE 100 MG PO TABS: ORAL_TABLET | ORAL | 0 refills | Status: DC

## 2024-06-17 NOTE — Patient Instructions (Addendum)
 Medication Instructions:   Your physician recommends that you continue on your current medications as directed. Please refer to the Current Medication list given to you today.   *If you need a refill on your cardiac medications before your next appointment, please call your pharmacy*  Lab Work:  Your provider would like for you to have following labs drawn today BMET.    If you have labs (blood work) drawn today and your tests are completely normal, you will receive your results only by: MyChart Message (if you have MyChart) OR A paper copy in the mail If you have any lab test that is abnormal or we need to change your treatment, we will call you to review the results.  Testing/Procedures:    Your cardiac CT will be scheduled at one of the below locations:   [facility] [address] [place], [place] [address] [phone]  Please arrive 15 mins early for check-in and test prep.  There is spacious parking and easy access to the radiology department from the [facility] entrance. Please enter here and check-in with the desk attendant.   Please follow these instructions carefully (unless otherwise directed):  An IV will be required for this test and Nitroglycerin will be given.    On the Night Before the Test: Be sure to Drink plenty of water . Do not consume any caffeinated/decaffeinated beverages or chocolate 12 hours prior to your test. Do not take any antihistamines 12 hours prior to your test.   On the Day of the Test: Drink plenty of water  until 1 hour prior to the test. Do not eat any food 1 hour prior to test.   You may take your regular medications prior to the test. Take metoprolol (Lopressor) 100 mg two hours prior to test. Patients who wear a continuous glucose monitor MUST remove the device prior to scanning. FEMALES- please wear underwire-free bra if available, avoid dresses & tight clothing  After the Test: Drink  plenty of water . After receiving IV contrast, you may experience a mild flushed feeling. This is normal. On occasion, you may experience a mild rash up to 24 hours after the test. This is not dangerous. If this occurs, you can take Benadryl  25 mg, Zyrtec, Claritin, or Allegra and increase your fluid intake. (Patients taking Tikosyn should avoid Benadryl , and may take Zyrtec, Claritin, or Allegra) If you experience trouble breathing, this can be serious. If it is severe call 911 IMMEDIATELY. If it is mild, please call our office.  We will call to schedule your test 2-4 weeks out understanding that some insurance companies will need an authorization prior to the service being performed.   For more information and frequently asked questions, please visit our website : [email or web address]  For non-scheduling related questions, please contact the cardiac imaging nurse navigator should you have any questions/concerns: Cardiac Imaging Nurse Navigators Direct Office Dial: [phone]   For scheduling needs, including cancellations and rescheduling, please call [place], [phone].    Follow-Up: At [facility], you and your health needs are our priority.  As part of our continuing mission to provide you with exceptional heart care, our providers are all part of one team.  This team includes your primary Cardiologist (physician) and Advanced Practice Providers or APPs (Physician Assistants and Nurse Practitioners) who all work together to provide you with the care you need, when you need it.  Your next appointment:   1 month(s) (after ECHO)  Provider:  Cadence Franchester, PA-C

## 2024-06-17 NOTE — Telephone Encounter (Signed)
 pt called to check on if her FLMA paperwork had been recevied -

## 2024-06-17 NOTE — Telephone Encounter (Signed)
 pt was notified that paperwork has not been received asked if she can contact her caseworker and ask to resend the paperwork and make it attn: jessie. she gave me the phone # of [phone] ID# [id number]. told pt that i would check on monday and if I still have not received I would reach out to case worker and also contact her also.

## 2024-06-17 NOTE — Telephone Encounter (Signed)
 sent chat message to dr coby she states that she has not recevied paperwork as of yet.

## 2024-06-18 LAB — BASIC METABOLIC PANEL WITH GFR
BUN/Creatinine Ratio: 23 (ref 12–28)
BUN: 21 mg/dL (ref 8–27)
CO2: 22 mmol/L (ref 20–29)
Calcium: 9.3 mg/dL (ref 8.7–10.3)
Chloride: 104 mmol/L (ref 96–106)
Creatinine, Ser: 0.93 mg/dL (ref 0.57–1.00)
Glucose: 98 mg/dL (ref 70–99)
Potassium: 4.3 mmol/L (ref 3.5–5.2)
Sodium: 142 mmol/L (ref 134–144)
eGFR: 70 mL/min/{1.73_m2} (ref >59)

## 2024-06-20 ENCOUNTER — Ambulatory Visit: Payer: Self-pay | Admitting: Medical

## 2024-06-20 NOTE — Telephone Encounter (Signed)
 left message that flma form have not come in yet.

## 2024-06-21 ENCOUNTER — Telehealth (INDEPENDENT_AMBULATORY_CARE_PROVIDER_SITE_OTHER): Admitting: Psychiatry

## 2024-06-21 ENCOUNTER — Other Ambulatory Visit (HOSPITAL_COMMUNITY): Payer: Self-pay

## 2024-06-21 ENCOUNTER — Telehealth: Payer: Self-pay | Admitting: Pharmacy Technician

## 2024-06-21 ENCOUNTER — Encounter: Payer: Self-pay | Admitting: Psychiatry

## 2024-06-21 DIAGNOSIS — Z636 Dependent relative needing care at home: Secondary | ICD-10-CM

## 2024-06-21 DIAGNOSIS — F411 Generalized anxiety disorder: Secondary | ICD-10-CM

## 2024-06-21 DIAGNOSIS — F331 Major depressive disorder, recurrent, moderate: Secondary | ICD-10-CM | POA: Diagnosis not present

## 2024-06-21 MED ORDER — HYDROXYZINE PAMOATE 25 MG PO CAPS: 25.0000 mg | ORAL_CAPSULE | Freq: Every day | ORAL | 1 refills | Status: DC | PRN

## 2024-06-21 NOTE — Progress Notes (Signed)
 Virtual Visit via Video Note  I connected with Lindsay Mcdonald on 06/21/24 at 10:30 AM EDT by a video enabled telemedicine application and verified that I am speaking with the correct person using two identifiers.  Location Provider Location : [facility] Patient Location : Home  Participants: Patient , Provider   I discussed the limitations of evaluation and management by telemedicine and the availability of in person appointments. The patient expressed understanding and agreed to proceed.   I discussed the assessment and treatment plan with the patient. The patient was provided an opportunity to ask questions and all were answered. The patient agreed with the plan and demonstrated an understanding of the instructions.   The patient was advised to call back or seek an in-person evaluation if the symptoms worsen or if the condition fails to improve as anticipated.   BH MD OP Progress Note  06/21/2024 10:33 AM Lindsay Mcdonald  MRN:  [id number]  Chief Complaint:  Chief Complaint  Patient presents with   Follow-up   Anxiety   Depression   Medication Refill   Discussed the use of AI scribe software for clinical note transcription with the patient, who gave verbal consent to proceed.  History of Present Illness Lindsay Mcdonald is a 63 year old African-American female, currently employed, divorced, has a history of depression, chronic pain, diabetes mellitus, fibromyalgia, morbid obesity, hypertension, obstructive sleep apnea, hyperlipidemia was evaluated by telemedicine today.    She experiences ongoing depression and chronic pain, which she attributes to stress and caregiving responsibilities. She has been experiencing chest pain, for which an EKG was normal, and further cardiology evaluation is scheduled.  She suspects stress may be a contributing factor.  She is currently taking Viibryd  20 mg daily for depression. She attempted to alternate with 10 mg at night but felt groggy the next  morning, so she reverted to 20 mg daily.  She only tried it one night and hence does not know if any other factors contributed to her grogginess since currently she has a lot of situational stressors going on.  She wants to try 10 mg at night again to see if it improves her sleep.  She has chronic back pain due to a herniated disc, lumbar radiculitis, and osteoarthritis of the right hip. An injection is scheduled for July 11th to manage her back pain. The severity of her pain sometimes makes walking difficult, and she has requested to work from home, submitting documentation for work accommodations.  Her sleep is affected by pain, resulting in about six hours of sleep per night, often disrupted by tossing and turning. She takes melatonin and hydroxyzine  as needed, along with baclofen  for muscle spasms, which helps her sleep when pain is not severe.   She takes hydroxyzine  25 mg daily, initially for panic attacks, but now feels it is less effective. She does not experience grogginess from it.   She has been staying at her mother's apartment recently to assist with moving her mother, who is now in a nursing home. This has contributed to her increased pain and disrupted sleep.  Denies suicidal thoughts or thoughts of harming others.   She is interested in intermittent FMLA however is currently aware that this office has not received any intermittent FMLA form from her company.  She hence has sent a form through her my chart messaging system.  However she has not signed an ROI giving us  permission to release her information which is needed prior to completing this form.  Visit Diagnosis:    ICD-10-CM   1. Major depressive disorder, recurrent episode, moderate (HCC)  F33.1     2. Generalized anxiety disorder  F41.1 hydrOXYzine  (VISTARIL ) 25 MG capsule    3. Caregiver burden  Z63.6       Past Psychiatric History: I have reviewed past psychiatric history from progress note on 02/08/2024.  Past  trials of medications include sertraline , duloxetine , Xanax , Lexapro , Prozac , nortriptyline.  Past Medical History:  Past Medical History:  Diagnosis Date   Anxiety    Arthritis 03/2020   osteoarthritis both knees   Chronic pain of right knee    Chronic sinusitis    DDD (degenerative disc disease), cervical    cervical radiculopathy   Depression    Diabetes mellitus    insulin  dependant   Fibromyalgia    GERD (gastroesophageal reflux disease)    High cholesterol    Hyperlipidemia    Hypertension    Insomnia    Muscle pain    Nasal polyp    Neuromuscular disorder (HCC)    diabetic neuropathy   Obesity    Palpitations    Tendonitis of knee, left 04/2020   received steroid injection   Vitamin D  deficiency    Vitamin D  deficiency     Past Surgical History:  Procedure Laterality Date   BREAST REDUCTION SURGERY     COLONOSCOPY WITH PROPOFOL  N/A 02/03/2023   Procedure: COLONOSCOPY WITH PROPOFOL ;  Surgeon: Therisa Bi, MD;  Location: [facility] ENDOSCOPY;  Service: Gastroenterology;  Laterality: N/A;   ETHMOIDECTOMY Right 05/16/2020   Procedure: ETHMOIDECTOMY;  Surgeon: Edda Mt, MD;  Location: [facility] ORS;  Service: ENT;  Laterality: Right;   EXCISION ORAL TUMOR N/A 05/16/2020   Procedure: EXCISION OF SOFT PALATE PAPILLOMA;  Surgeon: Edda Mt, MD;  Location: [facility] ORS;  Service: ENT;  Laterality: N/A;   FRONTAL SINUS EXPLORATION Right 05/16/2020   Procedure: FRONTAL SINUS EXPLORATION;  Surgeon: Edda Mt, MD;  Location: [facility] ORS;  Service: ENT;  Laterality: Right;   IMAGE GUIDED SINUS SURGERY N/A 05/16/2020   Procedure: IMAGE GUIDED SINUS SURGERY;  Surgeon: Edda Mt, MD;  Location: [facility] ORS;  Service: ENT;  Laterality: N/A;   MAXILLARY ANTROSTOMY Bilateral 05/16/2020   Procedure: MAXILLARY ANTROSTOMY with tissue removal;  Surgeon: Edda Mt, MD;  Location: [facility] ORS;  Service: ENT;  Laterality: Bilateral;   nasal endoscopic     OOPHORECTOMY  ?   one ovary removed     REDUCTION MAMMAPLASTY Bilateral 2000   removal of ovary     SPHENOIDECTOMY Right 05/16/2020   Procedure: SPHENOIDECTOMY;  Surgeon: Edda Mt, MD;  Location: [facility] ORS;  Service: ENT;  Laterality: Right;    Family Psychiatric History: I have reviewed family psychiatric history from progress note on 02/08/2024.  Family History:  Family History  Problem Relation Age of Onset   Dementia Mother    Diabetes Mother    Cancer Mother 1       Breast   Breast cancer Mother 20   Gout Mother    Diabetes Father    Breast cancer Maternal Aunt 22   Kidney cancer Maternal Aunt    Kidney disease Maternal Aunt    Stroke Maternal Uncle    Alcohol abuse Daughter    Diabetes Daughter        Oldest Daughter   Alcohol abuse Daughter    Cancer Half-Sister 24       breast cancer   Prostate cancer Neg Hx    Bladder Cancer  Neg Hx     Social History: I have reviewed social history from progress note on 02/08/2024. Social History   Socioeconomic History   Marital status: Divorced    Spouse name: Not on file   Number of children: 2   Years of education: Not on file   Highest education level: Some college, no degree  Occupational History   Occupation: engineer, mining   Tobacco Use   Smoking status: Never   Smokeless tobacco: Never  Vaping Use   Vaping status: Former  Substance and Sexual Activity   Alcohol use: No    Alcohol/week: 0.0 standard drinks of alcohol   Drug use: No   Sexual activity: Not Currently    Partners: Male    Birth control/protection: None  Other Topics Concern   Not on file  Social History Narrative   Divorced and he died since the divorce   Working at patent attorney.    Social Drivers of Health   Financial Resource Strain: Medium Risk (04/06/2024)   Overall Financial Resource Strain (CARDIA)    Difficulty of Paying Living Expenses: Somewhat hard  Food Insecurity: No Food Insecurity (04/06/2024)   Hunger Vital Sign    Worried About Running Out of Food in the  Last Year: Never true    Ran Out of Food in the Last Year: Never true  Transportation Needs: No Transportation Needs (04/06/2024)   PRAPARE - Administrator, Civil Service (Medical): No    Lack of Transportation (Non-Medical): No  Physical Activity: Inactive (04/06/2024)   Exercise Vital Sign    Days of Exercise per Week: 0 days    Minutes of Exercise per Session: 0 min  Stress: Stress Concern Present (04/06/2024)   [employer] of Occupational Health - Occupational Stress Questionnaire    Feeling of Stress : Very much  Social Connections: Moderately Isolated (04/06/2024)   Social Connection and Isolation Panel    Frequency of Communication with Friends and Family: More than three times a week    Frequency of Social Gatherings with Friends and Family: Never    Attends Religious Services: 1 to 4 times per year    Active Member of [employer] or Organizations: No    Attends Banker Meetings: Never    Marital Status: Divorced    Allergies:  Allergies  Allergen Reactions   Simvastatin     Muscle cramps    Trazodone  Hcl     It may have caused sob    Zetia  [Ezetimibe ] Other (See Comments)    Myalgia    Atorvastatin Other (See Comments)    muscle cramps   Compazine  Other (See Comments)    Twitching and spasms of neck muscles   Penicillins Rash    Reaction under 25, patient unsure that she still has an allergy    Metabolic Disorder Labs: Lab Results  Component Value Date   HGBA1C 6.7 (A) 04/25/2024   MPG 166 02/19/2022   MPG 146 04/09/2020   No results found for: PROLACTIN Lab Results  Component Value Date   CHOL 183 05/27/2024   TRIG 83 05/27/2024   HDL 59 05/27/2024   CHOLHDL 3.1 05/27/2024   VLDL 29 06/27/2016   LDLCALC 107 (H) 05/27/2024   LDLCALC 134 (H) 05/15/2023   Lab Results  Component Value Date   TSH 3.27 10/08/2023   TSH 3.00 02/22/2021    Therapeutic Level Labs: No results found for: LITHIUM No results found for:  VALPROATE No results found for:  CBMZ  Current Medications: Current Outpatient Medications  Medication Sig Dispense Refill   ACCU-CHEK GUIDE TEST test strip USE THREE TIMES DAILY TO TEST BLOOD SUGAR. MORNING, NOON AND BEDTIME. 100 strip 0   Accu-Chek Softclix Lancets lancets USE TO TEST BLOOD SUGAR THREE TIMES DAILY. MORNING, NOON AND BEDTIME. 100 each 0   aspirin EC 81 MG tablet Take 81 mg by mouth daily. Swallow whole.     baclofen  (LIORESAL ) 10 MG tablet Take 10 mg by mouth at bedtime.     benzonatate  (TESSALON ) 100 MG capsule Take 1-2 capsules (100-200 mg total) by mouth 3 (three) times daily as needed for cough. 40 capsule 0   estradiol -norethindrone (COMBIPATCH) 0.05-0.14 MG/DAY Place 1 patch onto the skin.     fluticasone  (FLONASE ) 50 MCG/ACT nasal spray Place 2 sprays into both nostrils daily as needed for allergies or rhinitis.     GVOKE HYPOPEN  2-PACK 1 MG/0.2ML SOAJ Inject 1 mg into the skin as needed (for hypoglycemia episodes (glucose below 60 and symptomatic)). 0.4 mL 1   insulin  glargine, 1 Unit Dial, (TOUJEO  SOLOSTAR) 300 UNIT/ML Solostar Pen Inject 40-50 Units into the skin every morning. 4.5 mL 0   Insulin  Pen Needle 32G X 6 MM MISC 1 each by Does not apply route daily at 2 PM. 100 each 1   Melatonin 10 MG TABS Take 10 mg by mouth at bedtime as needed (sleep).      metFORMIN  (GLUCOPHAGE -XR) 750 MG 24 hr tablet Take 1 tablet (750 mg total) by mouth daily with breakfast. 90 tablet 0   naproxen  (NAPROSYN ) 500 MG tablet Take 1 tablet (500 mg total) by mouth 2 (two) times daily as needed. 14 tablet 0   olmesartan  (BENICAR ) 40 MG tablet Take 1 tablet (40 mg total) by mouth daily. 90 tablet 1   tirzepatide  (MOUNJARO ) 12.5 MG/0.5ML Pen Inject 12.5 mg into the skin once a week. 6 mL 0   traMADol  (ULTRAM ) 50 MG tablet Take 50 mg by mouth 2 (two) times daily as needed.     Vilazodone  HCl 20 MG TABS Take 1 tablet (20 mg total) by mouth daily. 30 tablet 1   hydrOXYzine  (VISTARIL ) 25 MG  capsule Take 1-2 capsules (25-50 mg total) by mouth daily as needed for anxiety. 60 capsule 1   meclizine (ANTIVERT) 25 MG tablet  (Patient not taking: Reported on 06/21/2024)     metoprolol  tartrate (LOPRESSOR ) 100 MG tablet TAKE 1 TABLET 2 HR PRIOR TO CARDIAC PROCEDURE (Patient not taking: Reported on 06/21/2024) 1 tablet 0   Vilazodone  HCl (VIIBRYD ) 10 MG TABS Take 1 tablet (10 mg total) by mouth daily. Take daily with 20 mg , total of 30 mg daily (Patient not taking: Reported on 06/21/2024) 30 tablet 1   No current facility-administered medications for this visit.     Musculoskeletal: Strength & Muscle Tone: UTA Gait & Station: Seated Patient leans: N/A  Psychiatric Specialty Exam: Review of Systems  Psychiatric/Behavioral:  Positive for dysphoric mood and sleep disturbance. The patient is nervous/anxious.     There were no vitals taken for this visit.There is no height or weight on file to calculate BMI.  General Appearance: Casual  Eye Contact:  Fair  Speech:  Clear and Coherent  Volume:  Normal  Mood:  Anxious and Depressed  Affect:  Appropriate  Thought Process:  Goal Directed and Descriptions of Associations: Intact  Orientation:  Full (Time, Place, and Person)  Thought Content: Logical   Suicidal Thoughts:  No  Homicidal Thoughts:  No  Memory:  Immediate;   Fair Recent;   Fair Remote;   Fair  Judgement:  Fair  Insight:  Fair  Psychomotor Activity:  Normal  Concentration:  Concentration: Fair and Attention Span: Fair  Recall:  [employer] of Knowledge: Fair  Language: Fair  Akathisia:  No  Handed:  Right  AIMS (if indicated): not done  Assets:  Manufacturing Systems Engineer Desire for Improvement Housing Social Support Transportation  ADL's:  Intact  Cognition: WNL  Sleep:  Varies   Screenings: GAD-7    Garment/textile Technologist Visit from 05/27/2024 in [facility] Office Visit from 04/25/2024 in [facility] Office Visit  from 04/13/2024 in [facility] Counselor from 04/06/2024 in [facility] Office Visit from 02/22/2024 in [facility]  Total GAD-7 Score 21 20 20 20  0   PHQ2-9    Flowsheet Row Office Visit from 05/27/2024 in [facility] Office Visit from 04/25/2024 in [facility] Office Visit from 04/13/2024 in [facility] Counselor from 04/06/2024 in [facility] Office Visit from 02/22/2024 in [facility]  PHQ-2 Total Score 2 2 2 3  0  PHQ-9 Total Score 13 13 13 16  0   Flowsheet Row Video Visit from 06/21/2024 in [facility] Video Visit from 06/09/2024 in [facility] Video Visit from 05/17/2024 in [facility]  C-SSRS RISK CATEGORY No Risk No Risk No Risk     Assessment and Plan: Lindsay Mcdonald is a 63 year old African-American female who has a history of depression, anxiety, caregiver burnout was evaluated by telemedicine today.  Discussed assessment and plan as noted below.  Major depression-unstable Continues to struggle with depression symptoms mostly exacerbated by ongoing pain, mother's health problems being the primary caregiver.  Did not increase the dosage of Viibryd  as discussed last visit, reports she tried it only 1 night and did not take the 30 mg daily after that since she had a lot of other stressors going on.  Agrees to give it another chance and will call the clinic for a sooner appointment if she does not tolerate the higher dosage. Encouraged to increase Viibryd  to 30 mg daily. Continue Melatonin 10 mg at bedtime as needed. Will need sufficient pain management as well since sleep continues to be affected mostly due to pain. Continue psychotherapy  sessions with Ms. Almarie Monas, has upcoming appointment scheduled.  GAD-unstable Continues to have anxiety symptoms exacerbated by situational stressors as well as pain. Increase Hydroxyzine  to 25 to 50 mg daily as needed. Continue psychotherapy sessions, has more frequent sessions scheduled with Ms. Veva. Will consider addition of another SSRI/SNRI if she does not tolerate the Viibryd .  Caregiver burden-unstable Continues to struggle with caregiver burnout.  Encouraged to have more frequent CBT Increase Hydroxyzine  to 25 to 50 mg daily as needed Encouraged to make use of psychotherapy sessions.   Patient requested intermittent FMLA form be completed.  Discussed with patient that our office has not received any intermittent FMLA form sent over by her work.  Patient was able to sent my chart message with an FMLA form attached today.  However advised patient to complete the sessions where she has to fill out details regarding her information as well as advised patient to come in to sign  an ROI giving us  permission to release information.  Patient agrees to contact her work for an extension for completing FMLA intermittent.  Follow-up Follow-up in clinic in 2 to 3 weeks or sooner in person.  Collaboration of Care: Collaboration of Care: Referral or follow-up with counselor/therapist AEB encouraged to follow up with therapist more frequently.  Patient/Guardian was advised Release of Information must be obtained prior to any record release in order to collaborate their care with an outside provider. Patient/Guardian was advised if they have not already done so to contact the registration department to sign all necessary forms in order for us  to release information regarding their care.   Consent: Patient/Guardian gives verbal consent for treatment and assignment of benefits for services provided during this visit. Patient/Guardian expressed understanding and agreed to proceed.   This note  was generated in part or whole with voice recognition software. Voice recognition is usually quite accurate but there are transcription errors that can and very often do occur. I apologize for any typographical errors that were not detected and corrected.    Keyshia Orwick, MD 06/22/2024, 8:24 AM

## 2024-06-21 NOTE — Telephone Encounter (Signed)
 Pharmacy Patient Advocate Encounter   Received notification from Onbase that prior authorization for Toujeo  SoloStar 300UNIT/ML pen-injectors is required/requested.   Insurance verification completed.   The patient is insured through [facility] .   Per test claim: Refill too soon. PA is not needed at this time. Medication was filled 06/20/24. Next eligible fill date is 07/10/24.

## 2024-06-22 ENCOUNTER — Telehealth: Payer: Self-pay

## 2024-06-22 NOTE — Telephone Encounter (Signed)
 Dr. Eappen received FLMA form and she asked me to call and see if we can get a ext. on the due date that was for july 5th.. just received paperwork today.

## 2024-06-22 NOTE — Telephone Encounter (Signed)
 called and was granted a ext. due by july 12th

## 2024-06-23 ENCOUNTER — Telehealth: Payer: Self-pay | Admitting: Psychiatry

## 2024-06-23 ENCOUNTER — Ambulatory Visit: Admitting: Physician Assistant

## 2024-06-23 ENCOUNTER — Telehealth: Payer: Self-pay | Admitting: Cardiology

## 2024-06-23 NOTE — Telephone Encounter (Signed)
 Called and spoke with patient. Patient calling to verify that she needs to take Metoprolol Tartrate 2 hours prior to Cardiac CTA. Patient informed that the Metoprolol is for the Cardiac CTA. Patient verbalizes understanding and appreciation for call.

## 2024-06-23 NOTE — Telephone Encounter (Signed)
 Noted

## 2024-06-23 NOTE — Telephone Encounter (Signed)
 Contacted patient to discuss her concerns about sleep.  She is currently still having trouble taking the Viibryd  every day.  She is interested in giving it more time and maybe take it continuously for 2 weeks.  Discussed with patient she could go ahead and start taking hydroxyzine  for sleep and try Viibryd  during the day.  Once she gets to Viibryd  30 mg at least 2 weeks we will consider making further changes with her medications.  Patient agrees with plan.

## 2024-06-23 NOTE — Telephone Encounter (Signed)
 Patient calling in to discuss testing/procedure that suppose to be done for her. Please advise

## 2024-06-29 ENCOUNTER — Telehealth: Payer: Self-pay | Admitting: Psychiatry

## 2024-06-29 NOTE — Telephone Encounter (Signed)
 I have completed intermittent FMLA form as discussed, through 12/21/2024, 1 day/week.  Will have staff fax it.

## 2024-06-29 NOTE — Telephone Encounter (Signed)
 Forms faxed and confirmed. and placed in scann basket.

## 2024-07-01 ENCOUNTER — Ambulatory Visit: Admitting: Professional Counselor

## 2024-07-14 ENCOUNTER — Ambulatory Visit: Admitting: Psychiatry

## 2024-07-15 ENCOUNTER — Ambulatory Visit: Admitting: Professional Counselor

## 2024-07-17 ENCOUNTER — Encounter: Payer: Self-pay | Admitting: Family Medicine

## 2024-07-18 ENCOUNTER — Ambulatory Visit: Admitting: Professional Counselor

## 2024-07-18 ENCOUNTER — Encounter (HOSPITAL_COMMUNITY): Payer: Self-pay

## 2024-07-19 ENCOUNTER — Encounter: Payer: Self-pay | Admitting: Psychiatry

## 2024-07-19 ENCOUNTER — Other Ambulatory Visit: Payer: Self-pay | Admitting: Family Medicine

## 2024-07-19 ENCOUNTER — Ambulatory Visit (INDEPENDENT_AMBULATORY_CARE_PROVIDER_SITE_OTHER): Admitting: Psychiatry

## 2024-07-19 ENCOUNTER — Telehealth: Payer: Self-pay

## 2024-07-19 VITALS — BP 124/82 | HR 87 | Temp 98.2°F

## 2024-07-19 DIAGNOSIS — F411 Generalized anxiety disorder: Secondary | ICD-10-CM | POA: Diagnosis not present

## 2024-07-19 DIAGNOSIS — Z636 Dependent relative needing care at home: Secondary | ICD-10-CM

## 2024-07-19 DIAGNOSIS — F331 Major depressive disorder, recurrent, moderate: Secondary | ICD-10-CM

## 2024-07-19 DIAGNOSIS — E1142 Type 2 diabetes mellitus with diabetic polyneuropathy: Secondary | ICD-10-CM

## 2024-07-19 MED ORDER — LAMOTRIGINE 25 MG PO TABS: 25.0000 mg | ORAL_TABLET | Freq: Every day | ORAL | 1 refills | Status: DC

## 2024-07-19 NOTE — Progress Notes (Signed)
 [facility] MD OP Progress Note  07/19/2024 2:57 PM Lindsay Mcdonald  MRN:  [id number]  Chief Complaint:  Chief Complaint  Patient presents with   Follow-up   Anxiety   Depression   Medication Refill   Discussed the use of AI scribe software for clinical note transcription with the patient, who gave verbal consent to proceed.  History of Present Illness Lindsay Mcdonald is a 62 year old African-American female female currently employed, divorced, has a history of depression, chronic pain, diabetes mellitus, fibromyalgia, morbid obesity, hypertension, obstructive sleep apnea, hyperlipidemia was evaluated in office today for a follow-up appointment.  She reports ongoing symptoms of depression and anxiety, which she feels are worsened by the stress of caring for her mother. She is currently taking Viibryd  20 mg daily, having reverted to this dose after experiencing discomfort at 30 mg. She finds it challenging to evaluate the medication's effectiveness due to ongoing life stressors.  Her mother's care in an assisted living facility is a major source of stress, with issues such as neglect, including her mother not receiving showers for eight days and being left unattended. Her mother has dementia and is a fall risk, adding complexity to her care. She visits her mother every few days and maintains contact through phone calls. She has reported the facility to the state due to the poor conditions.  She experiences trouble sleeping, often due to stress and heat. She uses melatonin and chamomile tea to aid sleep, with occasional use of hydroxyzine , though she is cautious about its side effects.  Her social support includes her daughters, one of whom lives with her and assists with her mother's care. She is seeking additional support through [employer] and other resources but faces challenges in accessing specialized care for her mother's dementia.  She denies any suicidality, homicidality or perceptual  disturbances.  She is scheduled to see a pain management provider and looks forward to that.  Pain also has an impact on her mood.  Visit Diagnosis:    ICD-10-CM   1. Major depressive disorder, recurrent episode, moderate (HCC)  F33.1 lamoTRIgine  (LAMICTAL ) 25 MG tablet    2. Generalized anxiety disorder  F41.1 lamoTRIgine  (LAMICTAL ) 25 MG tablet    3. Caregiver burden  Z63.6       Past Psychiatric History: I have reviewed past psychiatric history from progress note on 02/08/2024.  Past trials of medications include sertraline , duloxetine , Xanax , Lexapro , Prozac , nortriptyline.  Past Medical History:  Past Medical History:  Diagnosis Date   Anxiety    Arthritis 03/2020   osteoarthritis both knees   Chronic pain of right knee    Chronic sinusitis    DDD (degenerative disc disease), cervical    cervical radiculopathy   Depression    Diabetes mellitus    insulin  dependant   Fibromyalgia    GERD (gastroesophageal reflux disease)    High cholesterol    Hyperlipidemia    Hypertension    Insomnia    Muscle pain    Nasal polyp    Neuromuscular disorder (HCC)    diabetic neuropathy   Obesity    Palpitations    Tendonitis of knee, left 04/2020   received steroid injection   Vitamin D  deficiency    Vitamin D  deficiency     Past Surgical History:  Procedure Laterality Date   BREAST REDUCTION SURGERY     COLONOSCOPY WITH PROPOFOL  N/A 02/03/2023   Procedure: COLONOSCOPY WITH PROPOFOL ;  Surgeon: Therisa Bi, MD;  Location: [facility] END[facility];  Service:  Gastroenterology;  Laterality: N/A;   ETHMOIDECTOMY Right 05/16/2020   Procedure: ETHMOIDECTOMY;  Surgeon: Edda Mt, MD;  Location: [facility] ORS;  Service: ENT;  Laterality: Right;   EXCISION ORAL TUMOR N/A 05/16/2020   Procedure: EXCISION OF SOFT PALATE PAPILLOMA;  Surgeon: Edda Mt, MD;  Location: [facility] ORS;  Service: ENT;  Laterality: N/A;   FRONTAL SINUS EXPLORATION Right 05/16/2020   Procedure: FRONTAL SINUS EXPLORATION;   Surgeon: Edda Mt, MD;  Location: [facility] ORS;  Service: ENT;  Laterality: Right;   IMAGE GUIDED SINUS SURGERY N/A 05/16/2020   Procedure: IMAGE GUIDED SINUS SURGERY;  Surgeon: Edda Mt, MD;  Location: [facility] OR[facility];  Service: ENT;  Laterality: N/A;   MAXILLARY ANTROSTOMY Bilateral 05/16/2020   Procedure: MAXILLARY ANTROSTOMY with tissue removal;  Surgeon: Edda Mt, MD;  Location: [facility] ORS;  Service: ENT;  Laterality: Bilateral;   nasal endoscopic     OOPHORECTOMY  ?   one ovary removed    REDUCTION MAMMAPLASTY Bilateral 2000   removal of ovary     SPHENOIDECTOMY Right 05/16/2020   Procedure: SPHENOIDECTOMY;  Surgeon: Edda Mt, MD;  Location: [facility] ORS;  Service: ENT;  Laterality: Right;    Family Psychiatric History: I have reviewed family psychiatric history from progress note on 02/08/2024.  Family History:  Family History  Problem Relation Age of Onset   Dementia Mother    Diabetes Mother    Cancer Mother 66       Breast   Breast cancer Mother 49   Gout Mother    Diabetes Father    Breast cancer Maternal Aunt 68   Kidney cancer Maternal Aunt    Kidney disease Maternal Aunt    Stroke Maternal Uncle    Alcohol abuse Daughter    Diabetes Daughter        Oldest Daughter   Alcohol abuse Daughter    Cancer Half-Sister 51       breast cancer   Prostate cancer Neg Hx    Bladder Cancer Neg Hx     Social History: I have reviewed social history from progress note on 02/08/2024. Social History   Socioeconomic History   Marital status: Divorced    Spouse name: Not on file   Number of children: 2   Years of education: Not on file   Highest education level: Some college, no degree  Occupational History   Occupation: Engineer, mining   Tobacco Use   Smoking status: Never   Smokeless tobacco: Never  Vaping Use   Vaping status: Former  Substance and Sexual Activity   Alcohol use: No    Alcohol/week: 0.0 standard drinks of alcohol   Drug use: No   Sexual activity: Not  Currently    Partners: Male    Birth control/protection: None  Other Topics Concern   Not on file  Social History Narrative   Divorced and he died since the divorce   Working at Patent attorney.    Social Drivers of Health   Financial Resource Strain: Medium Risk (07/19/2024)   Received from [facility]   Overall Financial Resource Strain (CARDIA)    Difficulty of Paying Living Expenses: Somewhat hard  Food Insecurity: Food Insecurity Present (07/19/2024)   Received from [facility]   Hunger Vital Sign    Within the past 12 months, you worried that your food would run out before you got the money to buy more.: Sometimes true    Within the past 12 months, the food you  bought just didn't last and you didn't have money to get more.: Sometimes true  Transportation Needs: No Transportation Needs (07/19/2024)   Received from [facility] - Transportation    In the past 12 months, has lack of transportation kept you from medical appointments or from getting medications?: No    Lack of Transportation (Non-Medical): No  Physical Activity: Inactive (04/06/2024)   Exercise Vital Sign    Days of Exercise per Week: 0 days    Minutes of Exercise per Session: 0 min  Stress: Stress Concern Present (04/06/2024)   [employer] of Occupational Health - Occupational Stress Questionnaire    Feeling of Stress : Very much  Social Connections: Moderately Isolated (04/06/2024)   Social Connection and Isolation Panel    Frequency of Communication with Friends and Family: More than three times a week    Frequency of Social Gatherings with Friends and Family: Never    Attends Religious Services: 1 to 4 times per year    Active Member of [employer] or Organizations: No    Attends Banker Meetings: Never    Marital Status: Divorced    Allergies:  Allergies  Allergen Reactions   Simvastatin     Muscle cramps    Trazodone  Hcl      It may have caused sob    Zetia  [Ezetimibe ] Other (See Comments)    Myalgia    Atorvastatin Other (See Comments)    muscle cramps   Compazine  Other (See Comments)    Twitching and spasms of neck muscles   Penicillins Rash    Reaction under 25, patient unsure that she still has an allergy    Metabolic Disorder Labs: Lab Results  Component Value Date   HGBA1C 6.7 (A) 04/25/2024   MPG 166 02/19/2022   MPG 146 04/09/2020   No results found for: PROLACTIN Lab Results  Component Value Date   CHOL 183 05/27/2024   TRIG 83 05/27/2024   HDL 59 05/27/2024   CHOLHDL 3.1 05/27/2024   VLDL 29 06/27/2016   LDLCALC 107 (H) 05/27/2024   LDLCALC 134 (H) 05/15/2023   Lab Results  Component Value Date   TSH 3.27 10/08/2023   TSH 3.00 02/22/2021    Therapeutic Level Labs: No results found for: LITHIUM No results found for: VALPROATE No results found for: CBMZ  Current Medications: Current Outpatient Medications  Medication Sig Dispense Refill   ACCU-CHEK GUIDE TEST test strip USE THREE TIMES DAILY TO TEST BLOOD SUGAR. MORNING, NOON AND BEDTIME. 100 strip 0   Accu-Chek Softclix Lancets lancets USE TO TEST BLOOD SUGAR THREE TIMES DAILY. MORNING, NOON AND BEDTIME. 100 each 0   aspirin EC 81 MG tablet Take 81 mg by mouth daily. Swallow whole.     baclofen  (LIORESAL ) 10 MG tablet Take 10 mg by mouth at bedtime.     benzonatate  (TESSALON ) 100 MG capsule Take 1-2 capsules (100-200 mg total) by mouth 3 (three) times daily as needed for cough. 40 capsule 0   estradiol -norethindrone (COMBIPATCH) 0.05-0.14 MG/DAY Place 1 patch onto the skin.     fluticasone  (FLONASE ) 50 MCG/ACT nasal spray Place 2 sprays into both nostrils daily as needed for allergies or rhinitis.     GVOKE HYPOPEN  2-PACK 1 MG/0.2ML SOAJ Inject 1 mg into the skin as needed (for hypoglycemia episodes (glucose below 60 and symptomatic)). 0.4 mL 1   hydrOXYzine  (VISTARIL ) 25 MG capsule Take 1-2 capsules (25-50 mg total) by  mouth daily as  needed for anxiety. 60 capsule 1   insulin  glargine, 1 Unit Dial, (TOUJEO  SOLOSTAR) 300 UNIT/ML Solostar Pen Inject 40-50 Units into the skin every morning. 4.5 mL 0   Insulin  Pen Needle 32G X 6 MM MISC 1 each by Does not apply route daily at 2 PM. 100 each 1   lamoTRIgine  (LAMICTAL ) 25 MG tablet Take 1 tablet (25 mg total) by mouth daily. 30 tablet 1   meclizine (ANTIVERT) 25 MG tablet      Melatonin 10 MG TABS Take 10 mg by mouth at bedtime as needed (sleep).      metFORMIN  (GLUCOPHAGE -XR) 750 MG 24 hr tablet Take 1 tablet (750 mg total) by mouth daily with breakfast. 90 tablet 0   metoprolol  tartrate (LOPRESSOR ) 100 MG tablet TAKE 1 TABLET 2 HR PRIOR TO CARDIAC PROCEDURE 1 tablet 0   naproxen  (NAPROSYN ) 500 MG tablet Take 1 tablet (500 mg total) by mouth 2 (two) times daily as needed. 14 tablet 0   olmesartan  (BENICAR ) 40 MG tablet Take 1 tablet (40 mg total) by mouth daily. 90 tablet 1   tirzepatide  (MOUNJARO ) 12.5 MG/0.5ML Pen Inject 12.5 mg into the skin once a week. 6 mL 0   traMADol (ULTRAM) 50 MG tablet Take 50 mg by mouth 2 (two) times daily as needed.     Vilazodone  HCl 20 MG TABS Take 1 tablet (20 mg total) by mouth daily. 30 tablet 1   No current facility-administered medications for this visit.     Musculoskeletal: Strength & Muscle Tone: within normal limits Gait & Station: normal Patient leans: N/A  Psychiatric Specialty Exam: Review of Systems  Psychiatric/Behavioral:  Positive for dysphoric mood and sleep disturbance. The patient is nervous/anxious.     Blood pressure 124/82, pulse 87, temperature 98.2 F (36.8 C), temperature source Temporal, SpO2 95%.There is no height or weight on file to calculate BMI.  General Appearance: Fairly Groomed  Eye Contact:  Fair  Speech:  Clear and Coherent  Volume:  Normal  Mood:  Anxious and Depressed  Affect:  Congruent  Thought Process:  Goal Directed and Descriptions of Associations: Intact  Orientation:  Full  (Time, Place, and Person)  Thought Content: Logical   Suicidal Thoughts:  No  Homicidal Thoughts:  No  Memory:  Immediate;   Fair Recent;   Fair Remote;   Fair  Judgement:  Fair  Insight:  Fair  Psychomotor Activity:  Normal  Concentration:  Concentration: Fair and Attention Span: Fair  Recall:  [employer] of Knowledge: Fair  Language: Fair  Akathisia:  No  Handed:  Right  AIMS (if indicated): not done  Assets:  Communication Skills Desire for Improvement Housing Talents/Skills Transportation  ADL's:  Intact  Cognition: WNL  Sleep:  varies   Screenings: GAD-7    Garment/textile technologist Visit from 07/19/2024 in [facility] Office Visit from 05/27/2024 in [facility] Visit from 04/25/2024 in [facility] Visit from [facility] from 04/06/2024 in [facility]  Total GAD-7 Score 7 21 20 20 20    PHQ2-9    Flowsheet Row Office Visit from 07/19/2024 in [facility] Office Visit from 05/27/2024 in [facility] Office Visit from 04/25/2024 in [facility] Office Visit from 04/13/2024 in [facility] Counselor from 04/06/2024 in [facility]iatric Associates  PHQ-2 Total Score 2 2 2 2 3   PHQ-9 Total Score 8 13 13 13 16    Flowsheet Row Office Visit from 07/19/2024 in [facility] Video Visit from 06/21/2024 in [facility] Video Visit from 06/09/2024 in [facility]  C-SSRS RISK CATEGORY No Risk No Risk No Risk     Assessment and Plan: Milayah Krell is a 63 year old African-American female who has a history of depression, anxiety, was evaluated in  office today.  Discussed assessment and plan as noted below.  Major depression-unstable Continues to struggle with depression symptoms mostly exacerbated by ongoing challenges of taking care of her mother who is currently in an assisted living facility.  She also did not tolerate the higher dosage of Viibryd  and is currently taking at 20 mg.  Agreeable to addition of Lamictal .  Does not want to be on medications like Seroquel /atypical antipsychotics given long-term side effects.  Does report sleep problems although the addition of hydroxyzine  may have helped and she also has been using melatonin. Continue Viibryd  20 mg daily did not tolerate higher dosage Add Lamictal  25 mg daily Continue Melatonin low dosage for sleep  GAD-unstable Continues to have anxiety symptoms exacerbated by situational stressors as noted above.  Recent addition of hydroxyzine  has been beneficial and is motivated to stay in psychotherapy Continue Hydroxyzine  25 to 50 mg daily as needed Add Lamictal  as noted above Continue psychotherapy sessions with Ms. Veva, coordinated care.  Caregiver burnout-unstable Her mother is currently in an assisted living facility and she is currently struggling with challenges of making sure her mother gets the right care.  She does have good support system and is motivated to stay in therapy. Encouraged to continue psychotherapy sessions with Ms. Veva  Follow-up Follow-up in clinic in 4 weeks or sooner if needed.  Collaboration of Care: Collaboration of Care: Referral or follow-up with counselor/therapist AEB encouraged to continue psychotherapy sessions, coordinated care with Ms. Veva  Patient/Guardian was advised Release of Information must be obtained prior to any record release in order to collaborate their care with an outside provider. Patient/Guardian was advised if they have not already done so to contact the registration department to sign all necessary forms in order for us   to release information regarding their care.   Consent: Patient/Guardian gives verbal consent for treatment and assignment of benefits for services provided during this visit. Patient/Guardian expressed understanding and agreed to proceed.  This note was generated in part or whole with voice recognition software. Voice recognition is usually quite accurate but there are transcription errors that can and very often do occur. I apologize for any typographical errors that were not detected and corrected.     Lexey Fletes, MD 07/19/2024, 2:57 PM

## 2024-07-19 NOTE — Telephone Encounter (Unsigned)
 Copied from CRM [id number]. Topic: Clinical - Medical Advice >> Jul 19, 2024  3:57 PM Pinkey ORN wrote: Reason for CRM: Request For Inhaler >> Jul 19, 2024  3:59 PM Pinkey ORN wrote: Patient called in inquiring for an inhaler, states that she's used one before in the past. Patient's call back number is [phone]

## 2024-07-19 NOTE — Patient Instructions (Signed)
 Lamotrigine Tablets What is this medication? LAMOTRIGINE (la MOE Patrecia Pace) prevents and controls seizures in people with epilepsy. It may also be used to treat bipolar disorder. It works by calming overactive nerves in your body. This medicine may be used for other purposes; ask your health care provider or pharmacist if you have questions. COMMON BRAND NAME(S): Lamictal, Subvenite What should I tell my care team before I take this medication? They need to know if you have any of these conditions: Heart disease History of irregular heartbeat Immune system problems Kidney disease Liver disease Low levels of folic acid in the blood Lupus Mental health condition Suicidal thoughts, plans, or attempt by you or a family member An unusual or allergic reaction to lamotrigine, other medications, foods, dyes, or preservatives Pregnant or trying to get pregnant Breastfeeding How should I use this medication? Take this medication by mouth with a glass of water. Follow the directions on the prescription label. Do not chew these tablets. If this medication upsets your stomach, take it with food or milk. Take your doses at regular intervals. Do not take your medication more often than directed. A special MedGuide will be given to you by the pharmacist with each new prescription and refill. Be sure to read this information carefully each time. Talk to your care team about the use of this medication in children. While this medication may be prescribed for children as young as 2 years for selected conditions, precautions do apply. Overdosage: If you think you have taken too much of this medicine contact a poison control center or emergency room at once. NOTE: This medicine is only for you. Do not share this medicine with others. What if I miss a dose? If you miss a dose, take it as soon as you can. If it is almost time for your next dose, take only that dose. Do not take double or extra doses. What may  interact with this medication? Atazanavir Certain medications for irregular heartbeat Certain medications for seizures, such as carbamazepine, phenobarbital, phenytoin, primidone, or valproic acid Estrogen or progestin hormones Lopinavir Rifampin Ritonavir This list may not describe all possible interactions. Give your health care provider a list of all the medicines, herbs, non-prescription drugs, or dietary supplements you use. Also tell them if you smoke, drink alcohol, or use illegal drugs. Some items may interact with your medicine. What should I watch for while using this medication? Visit your care team for regular checks on your progress. If you take this medication for seizures, wear a Medic Alert bracelet or necklace. Carry an identification card with information about your condition, medications, and care team. It is important to take this medication exactly as directed. When first starting treatment, your dose will need to be adjusted slowly. It may take weeks or months before your dose is stable. You should contact your care team if your seizures get worse or if you have any new types of seizures. Do not stop taking this medication unless instructed by your care team. Stopping your medication suddenly can increase your seizures or their severity. This medication may cause serious skin reactions. They can happen weeks to months after starting the medication. Contact your care team right away if you notice fevers or flu-like symptoms with a rash. The rash may be red or purple and then turn into blisters or peeling of the skin. You may also notice a red rash with swelling of the face, lips, or lymph nodes in your neck or under your  arms. This medication may affect your coordination, reaction time, or judgment. Do not drive or operate machinery until you know how this medication affects you. Sit up or stand slowly to reduce the risk of dizzy or fainting spells. Drinking alcohol with this  medication can increase the risk of these side effects. If you are taking this medication for bipolar disorder, it is important to report any changes in your mood to your care team. If your condition gets worse, you get mentally depressed, feel very hyperactive or manic, have difficulty sleeping, or have thoughts of hurting yourself or committing suicide, you need to get help from your care team right away. If you are a caregiver for someone taking this medication for bipolar disorder, you should also report these behavioral changes right away. The use of this medication may increase the chance of suicidal thoughts or actions. Pay special attention to how you are responding while on this medication. Your mouth may get dry. Chewing sugarless gum or sucking hard candy and drinking plenty of water may help. Contact your care team if the problem does not go away or is severe. If you become pregnant while using this medication, you may enroll in the [place] American Antiepileptic Drug Pregnancy Registry by calling [phone]. This registry collects information about the safety of antiepileptic medication use during pregnancy. This medication may cause a decrease in folic acid. You should make sure that you get enough folic acid while you are taking this medication. Discuss the foods you eat and the vitamins you take with your care team. What side effects may I notice from receiving this medication? Side effects that you should report to your care team as soon as possible: Allergic reactions--skin rash, itching, hives, swelling of the face, lips, tongue, or throat Change in vision Fever, neck pain or stiffness, sensitivity to light, headache, nausea, vomiting, confusion, which may be signs of meningitis Fever, rash, swollen lymph nodes, confusion, trouble walking, loss of balance or coordination, seizures Heart rhythm changes--fast or irregular heartbeat, dizziness, feeling faint or lightheaded, chest pain,  trouble breathing Infection--fever, chills, cough, or sore throat Low red blood cell level--unusual weakness or fatigue, dizziness, headache, trouble breathing Rash, fever, and swollen lymph nodes Redness, blistering, peeling, or loosening of the skin, including inside the mouth Thoughts of suicide or self-harm, worsening mood, feelings of depression Unusual bruising or bleeding Side effects that usually do not require medical attention (report these to your care team if they continue or are bothersome): Diarrhea Dizziness Drowsiness Headache Nausea Stomach pain Tremors or shaking This list may not describe all possible side effects. Call your doctor for medical advice about side effects. You may report side effects to FDA at 1-800-FDA-1088. Where should I keep my medication? Keep out of the reach of children and pets. Store at [employer] [facility] (77 degrees F). Protect from light. Get rid of any unused medication after the expiration date. To get rid of medications that are no longer needed or have expired: Take the medication to a medication take-back program. Check with your pharmacy or law enforcement to find a location. If you cannot return the medication, check the label or package insert to see if the medication should be thrown out in the garbage or flushed down the toilet. If you are not sure, ask your care team. If it is safe to put it in the trash, empty the medication out of the container. Mix the medication with cat litter, dirt, coffee grounds, or other unwanted substance.  Seal the mixture in a bag or container. Put it in the trash. NOTE: This sheet is a summary. It may not cover all possible information. If you have questions about this medicine, talk to your doctor, pharmacist, or health care provider.  2024 Elsevier/Gold Standard (2023-11-20 00:00:00)

## 2024-07-21 ENCOUNTER — Encounter: Payer: Self-pay | Admitting: Family Medicine

## 2024-07-21 ENCOUNTER — Ambulatory Visit

## 2024-07-21 ENCOUNTER — Ambulatory Visit: Admitting: Family Medicine

## 2024-07-21 ENCOUNTER — Other Ambulatory Visit

## 2024-07-21 VITALS — BP 122/78 | HR 82 | Resp 16 | Ht 64.0 in | Wt 244.1 lb

## 2024-07-21 DIAGNOSIS — F5105 Insomnia due to other mental disorder: Secondary | ICD-10-CM | POA: Diagnosis not present

## 2024-07-21 DIAGNOSIS — E1159 Type 2 diabetes mellitus with other circulatory complications: Secondary | ICD-10-CM

## 2024-07-21 DIAGNOSIS — G720 Drug-induced myopathy: Secondary | ICD-10-CM

## 2024-07-21 DIAGNOSIS — I152 Hypertension secondary to endocrine disorders: Secondary | ICD-10-CM

## 2024-07-21 DIAGNOSIS — Z6841 Body Mass Index (BMI) 40.0 and over, adult: Secondary | ICD-10-CM

## 2024-07-21 DIAGNOSIS — J302 Other seasonal allergic rhinitis: Secondary | ICD-10-CM

## 2024-07-21 DIAGNOSIS — E1142 Type 2 diabetes mellitus with diabetic polyneuropathy: Secondary | ICD-10-CM

## 2024-07-21 DIAGNOSIS — R058 Other specified cough: Secondary | ICD-10-CM | POA: Diagnosis not present

## 2024-07-21 DIAGNOSIS — E785 Hyperlipidemia, unspecified: Secondary | ICD-10-CM | POA: Diagnosis not present

## 2024-07-21 DIAGNOSIS — F330 Major depressive disorder, recurrent, mild: Secondary | ICD-10-CM

## 2024-07-21 DIAGNOSIS — E1169 Type 2 diabetes mellitus with other specified complication: Secondary | ICD-10-CM

## 2024-07-21 LAB — POCT GLYCOSYLATED HEMOGLOBIN (HGB A1C): Hemoglobin A1C: 7.3 % — AB (ref 4.0–5.6)

## 2024-07-21 MED ORDER — AIRSUPRA 90-80 MCG/ACT IN AERO: 2.0000 | INHALATION_SPRAY | Freq: Four times a day (QID) | RESPIRATORY_TRACT | 0 refills | Status: AC

## 2024-07-21 MED ORDER — MONTELUKAST SODIUM 10 MG PO TABS: 10.0000 mg | ORAL_TABLET | Freq: Every day | ORAL | 1 refills | Status: DC

## 2024-07-21 MED ORDER — METFORMIN HCL ER 750 MG PO TB24: 1500.0000 mg | ORAL_TABLET | Freq: Every day | ORAL | 0 refills | Status: DC

## 2024-07-21 MED ORDER — TOUJEO SOLOSTAR 300 UNIT/ML ~~LOC~~ SOPN: 40.0000 [IU] | PEN_INJECTOR | SUBCUTANEOUS | 0 refills | Status: DC

## 2024-07-21 MED ORDER — MOUNJARO 12.5 MG/0.5ML ~~LOC~~ SOAJ: 12.5000 mg | SUBCUTANEOUS | 0 refills | Status: DC

## 2024-07-21 NOTE — Progress Notes (Signed)
 Name: Lindsay Mcdonald   MRN: [id number]    DOB: 1961/02/01   Date:07/21/2024       Progress Note  Subjective  Chief Complaint  Chief Complaint  Patient presents with   Medical Management of Chronic Issues   HPI   Cough: patient was seen by ENT about one month ago and treated with two rounds of antibiotics for a sinus infection, she states since that time she has noticed a recurrent dry cough and sometimes a sensation of SOB/feels like air trapped. She denies wheezing , sob not necessarily present with activity. She denies smoking or personal history of asthma but she has seasonal allergies.  Diabetes type 2 with obesity/HTN and dyslipidemia: A1C is up today, she states she has been eating more carbohydrates due to stress - carrying for her mother. She had steroid injections on her shoulders a few days ago and had some polyuria but back to baseline now. 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, under the care of psychiatrist and taking medication as prescribed, hydroxizine for sleep prn but not helping much   Patient Active Problem List   Diagnosis Date Noted   Caregiver burden 06/10/2024   Major depressive disorder, recurrent episode, moderate (HCC) 04/13/2024   Current mild episode of major depressive disorder without prior episode (HCC) 02/08/2024   Anxiety disorder 02/08/2024   Herniated intervertebral disc of lumbar spine 08/07/2023   B12 deficiency 05/15/2023   Occult blood positive stool 02/03/2023   Adenomatous polyp of colon 02/03/2023   Dyslipidemia associated with type 2 diabetes mellitus (HCC) 05/21/2021   Hypertension associated with diabetes (HCC) 08/31/2020   History of marijuana use 03/30/2019   Fibromyalgia 12/31/2018   Venous vascular malformations 03/27/2016   Seasonal allergic rhinitis 09/26/2015   Muscle spasms of neck 06/19/2015   Insomnia 06/19/2015   Primary osteoarthritis of right knee 06/17/2015   Depression,  major, recurrent, mild (HCC) 06/17/2015   Hyperlipidemia 06/17/2015   Benign hypertension 06/17/2015   Morbid obesity with BMI of 40.0-44.9, adult (HCC) 06/17/2015   Vitamin D  deficiency 06/17/2015    Past Surgical History:  Procedure Laterality Date   BREAST REDUCTION SURGERY     COLONOSCOPY WITH PROPOFOL  N/A 02/03/2023   Procedure: COLONOSCOPY WITH PROPOFOL ;  Surgeon: Therisa Bi, MD;  Location: [facility] ENDOSCOPY;  Service: Gastroenterology;  Laterality: N/A;   ETHMOIDECTOMY Right 05/16/2020   Procedure: ETHMOIDECTOMY;  Surgeon: Edda Mt, MD;  Location: [facility] ORS;  Service: ENT;  Laterality: Right;   EXCISION ORAL TUMOR N/A 05/16/2020   Procedure: EXCISION OF SOFT PALATE PAPILLOMA;  Surgeon: Edda Mt, MD;  Location: [facility] ORS;  Service: ENT;  Laterality: N/A;   FRONTAL SINUS EXPLORATION Right 05/16/2020   Procedure: FRONTAL SINUS EXPLORATION;  Surgeon: Edda Mt, MD;  Location: [facility] ORS;  Service: ENT;  Laterality: Right;   IMAGE GUIDED SINUS SURGERY N/A 05/16/2020   Procedure: IMAGE GUIDED SINUS SURGERY;  Surgeon: Edda Mt, MD;  Location: [facility] ORS;  Service: ENT;  Laterality: N/A;   MAXILLARY ANTROSTOMY Bilateral 05/16/2020   Procedure: MAXILLARY ANTROSTOMY with tissue removal;  Surgeon: Edda Mt, MD;  Location: [facility] ORS;  Service: ENT;  Laterality: Bilateral;   nasal endoscopic     OOPHORECTOMY  ?   one ovary removed    REDUCTION MAMMAPLASTY Bilateral 2000   removal of ovary     SPHENOIDECTOMY Right 05/16/2020   Procedure: SPHENOIDECTOMY;  Surgeon: Edda Mt, MD;  Location: [facility] ORS;  Service: ENT;  Laterality: Right;  Family History  Problem Relation Age of Onset   Dementia Mother    Diabetes Mother    Cancer Mother 30       Breast   Breast cancer Mother 24   Gout Mother    Diabetes Father    Breast cancer Maternal Aunt 83   Kidney cancer Maternal Aunt    Kidney disease Maternal Aunt    Stroke Maternal Uncle    Alcohol abuse Daughter    Diabetes  Daughter        Oldest Daughter   Alcohol abuse Daughter    Cancer Half-Sister 60       breast cancer   Prostate cancer Neg Hx    Bladder Cancer Neg Hx     Social History   Tobacco Use   Smoking status: Never   Smokeless tobacco: Never  Substance Use Topics   Alcohol use: No    Alcohol/week: 0.0 standard drinks of alcohol     Current Outpatient Medications:    ACCU-CHEK GUIDE TEST test strip, USE THREE TIMES DAILY TO TEST BLOOD SUGAR. MORNING, NOON AND BEDTIME., Disp: 100 strip, Rfl: 0   Accu-Chek Softclix Lancets lancets, USE TO TEST BLOOD SUGAR THREE TIMES DAILY. MORNING, NOON AND BEDTIME., Disp: 100 each, Rfl: 0   aspirin EC 81 MG tablet, Take 81 mg by mouth daily. Swallow whole., Disp: , Rfl:    baclofen  (LIORESAL ) 10 MG tablet, Take 10 mg by mouth at bedtime., Disp: , Rfl:    benzonatate  (TESSALON ) 100 MG capsule, Take 1-2 capsules (100-200 mg total) by mouth 3 (three) times daily as needed for cough., Disp: 40 capsule, Rfl: 0   estradiol -norethindrone (COMBIPATCH) 0.05-0.14 MG/DAY, Place 1 patch onto the skin., Disp: , Rfl:    fluticasone  (FLONASE ) 50 MCG/ACT nasal spray, Place 2 sprays into both nostrils daily as needed for allergies or rhinitis., Disp: , Rfl:    GVOKE HYPOPEN  2-PACK 1 MG/0.2ML SOAJ, Inject 1 mg into the skin as needed (for hypoglycemia episodes (glucose below 60 and symptomatic))., Disp: 0.4 mL, Rfl: 1   hydrOXYzine  (VISTARIL ) 25 MG capsule, Take 1-2 capsules (25-50 mg total) by mouth daily as needed for anxiety., Disp: 60 capsule, Rfl: 1   Insulin  Pen Needle 32G X 6 MM MISC, 1 each by Does not apply route daily at 2 PM., Disp: 100 each, Rfl: 1   lamoTRIgine  (LAMICTAL ) 25 MG tablet, Take 1 tablet (25 mg total) by mouth daily., Disp: 30 tablet, Rfl: 1   meclizine (ANTIVERT) 25 MG tablet, , Disp: , Rfl:    Melatonin 10 MG TABS, Take 10 mg by mouth at bedtime as needed (sleep). , Disp: , Rfl:    metFORMIN  (GLUCOPHAGE -XR) 750 MG 24 hr tablet, Take 1 tablet (750  mg total) by mouth daily with breakfast., Disp: 90 tablet, Rfl: 0   metoprolol  tartrate (LOPRESSOR ) 100 MG tablet, TAKE 1 TABLET 2 HR PRIOR TO CARDIAC PROCEDURE, Disp: 1 tablet, Rfl: 0   naproxen  (NAPROSYN ) 500 MG tablet, Take 1 tablet (500 mg total) by mouth 2 (two) times daily as needed., Disp: 14 tablet, Rfl: 0   olmesartan  (BENICAR ) 40 MG tablet, Take 1 tablet (40 mg total) by mouth daily., Disp: 90 tablet, Rfl: 1   tirzepatide  (MOUNJARO ) 12.5 MG/0.5ML Pen, Inject 12.5 mg into the skin once a week., Disp: 6 mL, Rfl: 0   TOUJEO  SOLOSTAR 300 UNIT/ML Solostar Pen, INJECT 40-50 UNITS INTO THE SKIN EVERY MORNING., Disp: 4.5 mL, Rfl: 0   traMADol (ULTRAM) 50  MG tablet, Take 50 mg by mouth 2 (two) times daily as needed., Disp: , Rfl:    Vilazodone  HCl 20 MG TABS, Take 1 tablet (20 mg total) by mouth daily., Disp: 30 tablet, Rfl: 1  Allergies  Allergen Reactions   Simvastatin     Muscle cramps    Trazodone  Hcl     It may have caused sob    Zetia  [Ezetimibe ] Other (See Comments)    Myalgia    Atorvastatin Other (See Comments)    muscle cramps   Compazine  Other (See Comments)    Twitching and spasms of neck muscles   Penicillins Rash    Reaction under 25, patient unsure that she still has an allergy    I personally reviewed active problem list, medication list, allergies with the patient/caregiver today.   ROS  Ten systems reviewed and is negative except as mentioned in HPI    Objective Physical Exam Constitutional: Patient appears well-developed and well-nourished. Obese  No distress.  HEENT: head atraumatic, normocephalic, pupils equal and reactive to light Cardiovascular: Normal rate, regular rhythm and normal heart sounds.  No murmur heard. No BLE edema. Pulmonary/Chest: Effort normal and breath sounds normal. No respiratory distress. Abdominal: Soft.  There is no tenderness. Psychiatric: Patient has a normal mood and affect. behavior is normal. Judgment and thought content  normal.   Vitals:   07/21/24 1537  BP: 122/78  Pulse: 82  Resp: 16  SpO2: 98%  Weight: 244 lb 1.6 oz (110.7 kg)  Height: 5' 4 (1.626 m)    Body mass index is 41.9 kg/m.  Recent Results (from the past 2160 hours)  POCT glycosylated hemoglobin (Hb A1C)     Status: Abnormal   Collection Time: 04/25/24  8:24 AM  Result Value Ref Range   Hemoglobin A1C 6.7 (A) 4.0 - 5.6 %   HbA1c POC (<> result, manual entry)     HbA1c, POC (prediabetic range)     HbA1c, POC (controlled diabetic range)    Lipid panel     Status: Abnormal   Collection Time: 05/27/24  3:47 PM  Result Value Ref Range   Cholesterol 183 <200 mg/dL   HDL 59 > OR = 50 mg/dL   Triglycerides 83 <849 mg/dL   LDL Cholesterol (Calc) 107 (H) mg/dL (calc)    Comment: Reference range: <100 . Desirable range <100 mg/dL for primary prevention;   <70 mg/dL for patients with CHD or diabetic patients  with > or = 2 CHD risk factors. SABRA LDL-C is now calculated using the Martin-Hopkins  calculation, which is a validated novel method providing  better accuracy than the Friedewald equation in the  estimation of LDL-C.  Gladis APPLETHWAITE et al. SANDREA. 7986;689(80): 2061-2068  (http://education.[facility].com/faq/FAQ164)    Total CHOL/HDL Ratio 3.1 <5.0 (calc)   Non-HDL Cholesterol (Calc) 124 <130 mg/dL (calc)    Comment: For patients with diabetes plus 1 major ASCVD risk  factor, treating to a non-HDL-C goal of <100 mg/dL  (LDL-C of <29 mg/dL) is considered a therapeutic  option.   Comprehensive metabolic panel with GFR     Status: Abnormal   Collection Time: 05/27/24  3:47 PM  Result Value Ref Range   Glucose, Bld 150 (H) 65 - 99 mg/dL    Comment: .            Fasting reference interval . For someone without known diabetes, a glucose value >125 mg/dL indicates that they may have diabetes and this should be confirmed with  a follow-up test. .    BUN 14 7 - 25 mg/dL   Creat 9.17 9.49 - 8.94 mg/dL   eGFR 82 > OR = 60  fO/fpw/8.26f7   BUN/Creatinine Ratio SEE NOTE: 6 - 22 (calc)    Comment:    Not Reported: BUN and Creatinine are within    reference range. .    Sodium 142 135 - 146 mmol/L   Potassium 4.1 3.5 - 5.3 mmol/L   Chloride 105 98 - 110 mmol/L   CO2 28 20 - 32 mmol/L   Calcium  8.9 8.6 - 10.4 mg/dL   Total Protein 6.8 6.1 - 8.1 g/dL   Albumin 3.9 3.6 - 5.1 g/dL   Globulin 2.9 1.9 - 3.7 g/dL (calc)   AG Ratio 1.3 1.0 - 2.5 (calc)   Total Bilirubin 0.3 0.2 - 1.2 mg/dL   Alkaline phosphatase (APISO) 80 37 - 153 U/L   AST 22 10 - 35 U/L   ALT 19 6 - 29 U/L  CBC with Differential/Platelet     Status: Abnormal   Collection Time: 05/27/24  3:47 PM  Result Value Ref Range   WBC 10.3 3.8 - 10.8 Thousand/uL   RBC 4.73 3.80 - 5.10 Million/uL   Hemoglobin 12.4 11.7 - 15.5 g/dL   HCT 61.1 64.9 - 54.9 %   MCV 82.0 80.0 - 100.0 fL   MCH 26.2 (L) 27.0 - 33.0 pg   MCHC 32.0 32.0 - 36.0 g/dL    Comment: For adults, a slight decrease in the calculated MCHC value (in the range of 30 to 32 g/dL) is most likely not clinically significant; however, it should be interpreted with caution in correlation with other red cell parameters and the patient's clinical condition.    RDW 12.7 11.0 - 15.0 %   Platelets 278 140 - 400 Thousand/uL   MPV 11.3 7.5 - 12.5 fL   Neutro Abs 5,315 1,500 - 7,800 cells/uL   Absolute Lymphocytes 3,636 850 - 3,900 cells/uL   Absolute Monocytes 814 200 - 950 cells/uL   Eosinophils Absolute 453 15 - 500 cells/uL   Basophils Absolute 82 0 - 200 cells/uL   Neutrophils Relative % 51.6 %   Total Lymphocyte 35.3 %   Monocytes Relative 7.9 %   Eosinophils Relative 4.4 %   Basophils Relative 0.8 %  Microalbumin / creatinine urine ratio     Status: None   Collection Time: 05/27/24  3:47 PM  Result Value Ref Range   Creatinine, Urine 164 20 - 275 mg/dL   Microalb, Ur 0.5 mg/dL    Comment: Reference Range Not established    Microalb Creat Ratio 3 <30 mg/g creat    Comment:  . The ADA defines abnormalities in albumin excretion as follows: SABRA Albuminuria Category        Result (mg/g creatinine) . Normal to Mildly increased   <30 Moderately increased         30-299  Severely increased           > OR = 300 . The ADA recommends that at least two of three specimens collected within a 3-6 month period be abnormal before considering a patient to be within a diagnostic category.   Basic metabolic panel with GFR     Status: None   Collection Time: 06/17/24  9:35 AM  Result Value Ref Range   Glucose 98 70 - 99 mg/dL   BUN 21 8 - 27 mg/dL   Creatinine, Ser 9.06 0.57 -  1.00 mg/dL   eGFR 70 >40 fO/fpw/8.26   BUN/Creatinine Ratio 23 12 - 28   Sodium 142 134 - 144 mmol/L   Potassium 4.3 3.5 - 5.2 mmol/L   Chloride 104 96 - 106 mmol/L   CO2 22 20 - 29 mmol/L   Calcium  9.3 8.7 - 10.3 mg/dL  POCT glycosylated hemoglobin (Hb A1C)     Status: Abnormal   Collection Time: 07/21/24  3:42 PM  Result Value Ref Range   Hemoglobin A1C 7.3 (A) 4.0 - 5.6 %   HbA1c POC (<> result, manual entry)     HbA1c, POC (prediabetic range)     HbA1c, POC (controlled diabetic range)      Diabetic Foot Exam:     PHQ2/9:    07/21/2024    3:37 PM 07/19/2024    9:08 AM 07/19/2024    8:56 AM 05/27/2024    3:15 PM 04/25/2024    8:20 AM  Depression screen PHQ 2/9  Decreased Interest 3   1 1   Down, Depressed, Hopeless 3   1 1   PHQ - 2 Score 6   2 2   Altered sleeping 3   0 0  Tired, decreased energy 3   3 3   Change in appetite 3   3 3   Feeling bad or failure about yourself  1   3 3   Trouble concentrating 1   2 2   Moving slowly or fidgety/restless 0   0 0  Suicidal thoughts 0   0 0  PHQ-9 Score 17   13 13   Difficult doing work/chores Very difficult   Somewhat difficult Somewhat difficult     Information is confidential and restricted. Go to Review Flowsheets to unlock data.    phq 9 is positive  Fall Risk:    07/21/2024    3:36 PM 05/27/2024    3:15 PM 04/25/2024    8:20 AM  02/04/2024    8:47 AM 11/11/2023   10:02 AM  Fall Risk   Falls in the past year? 0 0 0 0 0  Number falls in past yr: 0 0 0 0 0  Injury with Fall? 0 0 0 0 0  Risk for fall due to : No Fall Risks No Fall Risks No Fall Risks No Fall Risks No Fall Risks  Follow up Falls evaluation completed Falls prevention discussed;Education provided;Falls evaluation completed Falls prevention discussed;Education provided;Falls evaluation completed Falls prevention discussed;Falls evaluation completed;Education provided Falls prevention discussed     Assessment & Plan  1. Dyslipidemia associated with type 2 diabetes mellitus (HCC) (Primary)  - POCT glycosylated hemoglobin (Hb A1C)  - metFORMIN  (GLUCOPHAGE -XR) 750 MG 24 hr tablet; Take 2 tablets (1,500 mg total) by mouth daily with breakfast.  Dispense: 180 tablet; Refill: 0 - tirzepatide  (MOUNJARO ) 12.5 MG/0.5ML Pen; Inject 12.5 mg into the skin once a week.  Dispense: 6 mL; Refill: 0 - insulin  glargine, 1 Unit Dial, (TOUJEO  SOLOSTAR) 300 UNIT/ML Solostar Pen; Inject 40-50 Units into the skin every morning.  Dispense: 4.5 mL; Refill: 0   2. Recurrent dry cough  Discussed pos - bronchial cough but if no improvement of symptoms advised referral to pulmonologist or at least a PFT - Albuterol -Budesonide (AIRSUPRA ) 90-80 MCG/ACT AERO; Inhale 2 puffs into the lungs in the morning, at noon, in the evening, and at bedtime.  Dispense: 10.7 g; Refill: 0  3. Obesity, diabetes, and hypertension syndrome (HCC)  Discussed importance of resuming a diabetic diet to improve A1C and also weight  4. Depression, major, recurrent, mild (HCC)  Taking medication , stress is high due to her mother being ill   5. Insomnia due to mental disorder  Keep visits with psychiatrist   6. Statin myopathy  Unchanged   7. Seasonal allergic rhinitis, unspecified trigger  - montelukast  (SINGULAIR ) 10 MG tablet; Take 1 tablet (10 mg total) by mouth at bedtime.  Dispense: 90  tablet; Refill: 1  9. Morbid obesity with BMI of 40.0-44.9, adult [facility])  Discussed with the patient the risk posed by an increased BMI. Discussed importance of portion control, calorie counting and at least 150 minutes of physical activity weekly. Avoid sweet beverages and drink more water . Eat at least 6 servings of fruit and vegetables daily

## 2024-07-22 ENCOUNTER — Other Ambulatory Visit (HOSPITAL_COMMUNITY): Payer: Self-pay

## 2024-07-22 ENCOUNTER — Other Ambulatory Visit: Payer: Self-pay

## 2024-07-22 DIAGNOSIS — E1169 Type 2 diabetes mellitus with other specified complication: Secondary | ICD-10-CM

## 2024-07-22 MED ORDER — MOUNJARO 12.5 MG/0.5ML ~~LOC~~ SOAJ
12.5000 mg | SUBCUTANEOUS | 0 refills | Status: DC
Start: 2024-07-22 — End: 2024-08-08

## 2024-07-25 NOTE — Telephone Encounter (Signed)
 FYI

## 2024-07-26 ENCOUNTER — Ambulatory Visit
Attending: Student in an Organized Health Care Education/Training Program | Admitting: Student in an Organized Health Care Education/Training Program

## 2024-07-26 ENCOUNTER — Encounter: Payer: Self-pay | Admitting: Student in an Organized Health Care Education/Training Program

## 2024-07-26 VITALS — BP 150/88 | HR 72 | Temp 97.4°F | Resp 16 | Ht 64.0 in | Wt 245.0 lb

## 2024-07-26 DIAGNOSIS — G8929 Other chronic pain: Secondary | ICD-10-CM | POA: Insufficient documentation

## 2024-07-26 DIAGNOSIS — M4726 Other spondylosis with radiculopathy, lumbar region: Secondary | ICD-10-CM

## 2024-07-26 DIAGNOSIS — M12811 Other specific arthropathies, not elsewhere classified, right shoulder: Secondary | ICD-10-CM | POA: Insufficient documentation

## 2024-07-26 DIAGNOSIS — F331 Major depressive disorder, recurrent, moderate: Secondary | ICD-10-CM | POA: Insufficient documentation

## 2024-07-26 DIAGNOSIS — M12812 Other specific arthropathies, not elsewhere classified, left shoulder: Secondary | ICD-10-CM | POA: Insufficient documentation

## 2024-07-26 DIAGNOSIS — G894 Chronic pain syndrome: Secondary | ICD-10-CM | POA: Insufficient documentation

## 2024-07-26 DIAGNOSIS — M5416 Radiculopathy, lumbar region: Secondary | ICD-10-CM | POA: Diagnosis present

## 2024-07-26 DIAGNOSIS — M47816 Spondylosis without myelopathy or radiculopathy, lumbar region: Secondary | ICD-10-CM | POA: Diagnosis present

## 2024-07-26 DIAGNOSIS — M797 Fibromyalgia: Secondary | ICD-10-CM | POA: Diagnosis present

## 2024-07-26 NOTE — Progress Notes (Signed)
 PROVIDER NOTE: Interpretation of information contained herein should be left to medically-trained personnel. Specific patient instructions are provided elsewhere under Patient Instructions section of medical record. This document was created in part using AI and STT-dictation technology, any transcriptional errors that may result from this process are unintentional.  Patient: Lindsay Mcdonald  Service: E/M Encounter  Provider: Wallie Sherry, MD  DOB: 1960/12/23  Delivery: Face-to-face  Specialty: Interventional Pain Management  MRN: [id number]  Setting: Ambulatory outpatient facility  Specialty designation: 09  Type: New Patient  Location: Outpatient office facility  PCP: Sowles, Krichna, MD  DOS: 07/26/2024    Referring Prov.: Sowles, Krichna, MD   Primary Reason(s) for Visit: Encounter for initial evaluation of one or more chronic problems (new to examiner) potentially causing chronic pain, and posing a threat to normal musculoskeletal function. (Level of risk: High) CC: Back Pain (Low, right), Shoulder Pain (Bilat, radiating to neck), and Hip Pain (left)  HPI  Lindsay Mcdonald is a 63 y.o. year old, female patient, who comes for the first time to our practice referred by Sowles, Krichna, MD for our initial evaluation of her chronic pain. She has Primary osteoarthritis of right knee; Depression, major, recurrent, mild (HCC); Hyperlipidemia; Benign hypertension; Morbid obesity with BMI of 40.0-44.9, adult (HCC); Vitamin D  deficiency; Muscle spasms of neck; Insomnia; Seasonal allergic rhinitis; Venous vascular malformations; Fibromyalgia; History of marijuana use; Hypertension associated with diabetes (HCC); Dyslipidemia associated with type 2 diabetes mellitus (HCC); Occult blood positive stool; Adenomatous polyp of colon; B12 deficiency; Herniated intervertebral disc of lumbar spine; Current mild episode of major depressive disorder without prior episode (HCC); Anxiety disorder; Major depressive disorder,  recurrent episode, moderate (HCC); Caregiver burden; Lumbar spondylosis; Chronic radicular lumbar pain; Rotator cuff arthropathy of both shoulders (left>right); and Chronic pain syndrome on their problem list. Today she comes in for evaluation of her Back Pain (Low, right), Shoulder Pain (Bilat, radiating to neck), and Hip Pain (left)  Pain Assessment: Location: Lower, Right Back Radiating: down side of right leg to knee Onset: More than a month ago Duration: Chronic pain Quality: Burning, Aching, Throbbing, Discomfort Severity: 8 /10 (subjective, self-reported pain score)  Effect on ADL: limits dail activities Timing: Constant Modifying factors: Tramadol and OTC BP: (!) 150/88  HR: 72  Onset and Duration: Present longer than 3 months Cause of pain: Unknown Severity: NAS-11 now: 8/10 Timing: Not influenced by the time of the day Aggravating Factors: Bending, Lifiting, and Prolonged sitting Alleviating Factors: Medications and Relaxation therapy Associated Problems: Depression, Fatigue, Inability to concentrate, Personality changes, Sadness, Spasms, and Pain that wakes patient up Quality of Pain: Aching, Throbbing, and Uncomfortable Previous Examinations or Tests: MRI scan, X-rays, Neurological evaluation, and Orthopedic evaluation Previous Treatments: Epidural steroid injections and Relaxation therapy  [name] is being evaluated for possible interventional pain management therapies for the treatment of her chronic pain.    Discussed the use of AI scribe software for clinical note transcription with the patient, who gave verbal consent to proceed.  History of Present Illness   Lindsay Mcdonald is a 63 year old female with chronic low back pain, bilateral shoulder pain, and left hip pain who presents for pain management consultation. She has been referred by Dr. Avanell for further evaluation and management of her chronic pain conditions.  She has chronic low back pain radiating  into her right leg, stopping above the knee, persisting for about two years. A fall over fifteen years ago is noted, and she has been managing the pain with  epidural injections at the [facility]. Initially, these injections provided relief, but their effectiveness has diminished over time. Her last injection on July 11th provided only a few days of relief. An MRI conducted six months prior revealed a disc herniation at L5 S1 and arthritis at L4, L5, and L5 S1.  She experiences bilateral shoulder pain with a history of torn rotator cuffs identified in an MRI two years ago. Steroid injections have been somewhat effective but elevate her blood sugar levels due to type 2 diabetes. She is on insulin  for diabetes management. The left shoulder, despite having a smaller tear, is more painful than the right.  Her left hip pain has been treated with injections. No burning or tingling in her feet recently, although she has experienced these symptoms in the past.  She has fibromyalgia, which amplifies her pain experience. She attempted physical therapy once but could not continue due to insurance issues. Muscle spasms are managed with muscle relaxers prescribed by neurology, although she experiences breakthrough pain. She does not currently take magnesium supplements but tries to maintain electrolyte balance.  Her current medications include tramadol, which she takes as needed, and Aleve . She recently picked up a prescription for tramadol and plans to continue its use as necessary.      Procedural history: 07/01/2024: Right L5-S1 transforaminal ESI (dexamethasone  10 mg) 03/18/2024: Right hip joint injection (95% relief) 01/01/2024: Right L5-S1 transforaminal ESI (80% relief, Celestone  9 mg) 12/04/2023: Bilateral L5-S1 transforaminal ESI (90% relief for 2 weeks then gradual return of pain on the right 50% and continued improvement on the left, dexamethasone  10 mg) 10/01/2023: right trochanteric bursa  injection (little relief)   Historical Monitoring: The patient  reports no history of drug use. List of prior UDS Testing: Lab Results  Component Value Date   MDMA NONE DETECTED 03/13/2019   COCAINSCRNUR NONE DETECTED 03/13/2019   PCPSCRNUR NONE DETECTED 03/13/2019   THCU POSITIVE (A) 03/13/2019   Historical Background Evaluation: [place] PMP: PDMP not reviewed this encounter. Review of the past 54-months conducted.             [place] Department of public safety, offender search: Engineer, mining Information) Non-contributory Risk Assessment Profile: Aberrant behavior: None observed or detected today Risk factors for fatal opioid overdose: None identified today Fatal overdose hazard ratio (HR): Calculation deferred Non-fatal overdose hazard ratio (HR): Calculation deferred Risk of opioid abuse or dependence: 0.7-3.0% with doses <= 36 MME/day and 6.1-26% with doses >= 120 MME/day. Substance use disorder (SUD) risk level: See below Personal History of Substance Abuse (SUD-Substance use disorder):  Alcohol: Negative  Illegal Drugs: Negative  Rx Drugs: Negative  ORT Risk Level calculation: Low Risk  Opioid Risk Tool - 07/26/24 0833       Family History of Substance Abuse   Alcohol Positive Female    Illegal Drugs Negative    Rx Drugs Negative      Personal History of Substance Abuse   Alcohol Negative    Illegal Drugs Negative    Rx Drugs Negative      Age   Age between 68-45 years  No      Psychological Disease   Psychological Disease Negative    Depression Positive      Total Score   Opioid Risk Tool Scoring 2    Opioid Risk Interpretation Low Risk         ORT Scoring interpretation table:  Score <3 = Low Risk for SUD  Score between 4-7 = Moderate Risk for  SUD  Score >8 = High Risk for Opioid Abuse   PHQ-2 Depression Scale:  Total score: 2  PHQ-2 Scoring interpretation table: (Score and probability of major depressive disorder)  Score 0 = No depression  Score 1 = 15.4%  Probability  Score 2 = 21.1% Probability  Score 3 = 38.4% Probability  Score 4 = 45.5% Probability  Score 5 = 56.4% Probability  Score 6 = 78.6% Probability   PHQ-9 Depression Scale:  Total score: 2  PHQ-9 Scoring interpretation table:  Score 0-4 = No depression  Score 5-9 = Mild depression  Score 10-14 = Moderate depression  Score 15-19 = Moderately severe depression  Score 20-27 = Severe depression (2.4 times higher risk of SUD and 2.89 times higher risk of overuse)   Pharmacologic Plan: No opioid analgesics.            Initial impression: Pending review of available data and ordered tests.  Meds   Current Outpatient Medications:    ACCU-CHEK GUIDE TEST test strip, USE THREE TIMES DAILY TO TEST BLOOD SUGAR. MORNING, NOON AND BEDTIME., Disp: 100 strip, Rfl: 0   Accu-Chek Softclix Lancets lancets, USE TO TEST BLOOD SUGAR THREE TIMES DAILY. MORNING, NOON AND BEDTIME., Disp: 100 each, Rfl: 0   Albuterol -Budesonide (AIRSUPRA ) 90-80 MCG/ACT AERO, Inhale 2 puffs into the lungs in the morning, at noon, in the evening, and at bedtime., Disp: 10.7 g, Rfl: 0   aspirin EC 81 MG tablet, Take 81 mg by mouth daily. Swallow whole., Disp: , Rfl:    baclofen  (LIORESAL ) 10 MG tablet, Take 10 mg by mouth at bedtime., Disp: , Rfl:    benzonatate  (TESSALON ) 100 MG capsule, Take 1-2 capsules (100-200 mg total) by mouth 3 (three) times daily as needed for cough., Disp: 40 capsule, Rfl: 0   estradiol -norethindrone (COMBIPATCH) 0.05-0.14 MG/DAY, Place 1 patch onto the skin., Disp: , Rfl:    fluticasone  (FLONASE ) 50 MCG/ACT nasal spray, Place 2 sprays into both nostrils daily as needed for allergies or rhinitis., Disp: , Rfl:    hydrOXYzine  (VISTARIL ) 25 MG capsule, Take 1-2 capsules (25-50 mg total) by mouth daily as needed for anxiety., Disp: 60 capsule, Rfl: 1   insulin  glargine, 1 Unit Dial, (TOUJEO  SOLOSTAR) 300 UNIT/ML Solostar Pen, Inject 40-50 Units into the skin every morning., Disp: 4.5 mL, Rfl: 0    Insulin  Pen Needle 32G X 6 MM MISC, 1 each by Does not apply route daily at 2 PM., Disp: 100 each, Rfl: 1   meclizine (ANTIVERT) 25 MG tablet, , Disp: , Rfl:    Melatonin 10 MG TABS, Take 10 mg by mouth at bedtime as needed (sleep). , Disp: , Rfl:    metFORMIN  (GLUCOPHAGE -XR) 750 MG 24 hr tablet, Take 2 tablets (1,500 mg total) by mouth daily with breakfast., Disp: 180 tablet, Rfl: 0   naproxen  (NAPROSYN ) 500 MG tablet, Take 1 tablet (500 mg total) by mouth 2 (two) times daily as needed., Disp: 14 tablet, Rfl: 0   olmesartan  (BENICAR ) 40 MG tablet, Take 1 tablet (40 mg total) by mouth daily., Disp: 90 tablet, Rfl: 1   tirzepatide  (MOUNJARO ) 12.5 MG/0.5ML Pen, Inject 12.5 mg into the skin once a week., Disp: 6 mL, Rfl: 0   traMADol (ULTRAM) 50 MG tablet, Take 50 mg by mouth 2 (two) times daily as needed., Disp: , Rfl:    Vilazodone  HCl 20 MG TABS, Take 1 tablet (20 mg total) by mouth daily., Disp: 30 tablet, Rfl: 1   GVOKE  HYPOPEN 2-PACK 1 MG/0.2ML SOAJ, Inject 1 mg into the skin as needed (for hypoglycemia episodes (glucose below 60 and symptomatic)). (Patient not taking: Reported on 07/26/2024), Disp: 0.4 mL, Rfl: 1   lamoTRIgine  (LAMICTAL ) 25 MG tablet, Take 1 tablet (25 mg total) by mouth daily. (Patient not taking: Reported on 07/26/2024), Disp: 30 tablet, Rfl: 1   metoprolol  tartrate (LOPRESSOR ) 100 MG tablet, TAKE 1 TABLET 2 HR PRIOR TO CARDIAC PROCEDURE (Patient not taking: Reported on 07/26/2024), Disp: 1 tablet, Rfl: 0   montelukast  (SINGULAIR ) 10 MG tablet, Take 1 tablet (10 mg total) by mouth at bedtime. (Patient not taking: Reported on 07/26/2024), Disp: 90 tablet, Rfl: 1  Imaging Review   DG Cervical Spine 2-3 Views  Narrative CLINICAL DATA:  Radicular pain to left upper extremity.  EXAM: CERVICAL SPINE - 2-3 VIEW  COMPARISON:  None  FINDINGS: There is no evidence of cervical spine fracture or prevertebral soft tissue swelling. Alignment is normal. Degenerative disc disease  is identified at C5-6. No other significant bone abnormalities are identified.  IMPRESSION: 1. No acute findings. 2. C5-6 degenerative disc disease.   Electronically Signed By: Waddell Calk M.D. On: 12/23/2019 08:47   MR SHOULDER RIGHT WO CONTRAST  Narrative CLINICAL DATA:  Right shoulder pain while supine.  EXAM: MRI OF THE RIGHT SHOULDER WITHOUT CONTRAST  TECHNIQUE: Multiplanar, multisequence MR imaging of the shoulder was performed. No intravenous contrast was administered.  COMPARISON:  None.  FINDINGS: Rotator cuff: There is a large full-thickness tear of the entire supraspinatus tendon footprint (coronal images 8 through 12) and anterior aspect of the infraspinatus tendon footprint (coronal images 7 and 8) with up to 1.4 cm retraction of the bursal sided tendon fibers and up to 2.3 cm retraction of the articular sided tendon fibers (coronal image 9). Mild-to-moderate intermediate T2 signal tendinosis of the superior subscapularis insertion with a small fluid bright midsubstance partial-thickness tear (axial images tendon 11, sagittal images 9 through 11).  Muscles: Moderate anterior supraspinatus muscle atrophy. Possible mild superior subscapularis muscle atrophy.  Biceps long head: There is moderate intermediate T2 signal in thickening tendinosis of the proximal long head of the biceps tendon with a probable fluid bright midsubstance interstitial tear (sagittal series 10, image 9). No tendon retraction.  Acromioclavicular Joint: There are mild degenerative changes of the acromioclavicular joint including joint space narrowing, subchondral marrow edema, and peripheral osteophytosis. Type II acromion. Mild fluid extending from the glenohumeral joint into the subacromial/subdeltoid bursa.  Glenohumeral Joint: Mild thinning of the glenoid and humeral head cartilage.  Labrum: Grossly intact, but evaluation is limited by lack of intraarticular  fluid.  Bones:  No acute fracture.  Other: None.  IMPRESSION: 1. Large full-thickness tear of the entire supraspinatus tendon footprint and anterior infraspinatus tendon footprint. Moderate anterior supraspinatus muscle atrophy. 2. Superior subscapularis tendinosis with small fluid bright midsubstance partial-thickness tear. Possible mild superior subscapularis muscle atrophy. 3. Moderate proximal long head of the biceps tendinosis with probable fluid bright midsubstance sensory distal tear. 4. Mild degenerative changes of the acromioclavicular joint.   Electronically Signed By: Tanda Lyons M.D. On: 03/29/2022 12:21  Shoulder-L MR wo contrast: Results for orders placed during the hospital encounter of 06/11/20  MR SHOULDER LEFT WO CONTRAST  Narrative CLINICAL DATA:  Chronic left shoulder pain.  Impingement syndrome.  EXAM: MRI OF THE LEFT SHOULDER WITHOUT CONTRAST  TECHNIQUE: Multiplanar, multisequence MR imaging of the shoulder was performed. No intravenous contrast was administered.  COMPARISON:  Radiographs dated 12/23/2019  FINDINGS: Rotator cuff: There is a small focal full-thickness non retracted tear of the anterior aspect of the distal supraspinatus tendon. The remainder of the rotator cuff is normal.  Muscles: No atrophy or abnormal signal of the muscles of the rotator cuff.  Biceps long head:  Properly located and intact.  Acromioclavicular Joint:  Normal.  Type 2 acromion.  Glenohumeral Joint: Small glenohumeral joint effusion. No chondral defect.  Labrum:  Intact.  Bones:  Normal.  Other: None  IMPRESSION: 1. Small focal full-thickness non retracted tear of the anterior aspect of the distal supraspinatus tendon. 2. Small glenohumeral joint effusion.   Electronically Signed By: Lynwood Hugger M.D. On: 06/11/2020 16:55   DG Shoulder Left  Narrative CLINICAL DATA:  Left shoulder pain  EXAM: LEFT SHOULDER - 2+ VIEW  COMPARISON:   None.  FINDINGS: There is no evidence of fracture or dislocation. There is no evidence of arthropathy or other focal bone abnormality. Soft tissues are unremarkable.  IMPRESSION: Negative.   Electronically Signed By: Waddell Calk M.D. On: 12/23/2019 08:48     MR Lumbar Spine W Wo Contrast  Narrative CLINICAL DATA:  Right great toe pain. Bilateral lower extremity numbness  EXAM: MRI LUMBAR SPINE WITHOUT AND WITH CONTRAST  TECHNIQUE: Multiplanar and multiecho pulse sequences of the lumbar spine were obtained without and with intravenous contrast.  CONTRAST:  10 mL Vueway  IV contrast  COMPARISON:  None Available.  FINDINGS: Segmentation:  Standard.  Alignment:  Physiologic.  Vertebrae:  No fracture, evidence of discitis, or bone lesion.  Conus medullaris and cauda equina: Conus extends to the L1 level. Conus and cauda equina appear normal.  Paraspinal and other soft tissues: Negative.  Disc levels:  T12-L1 and L1-L2: Unremarkable.  L2-L3: Minimal annular disc bulge. Slight narrowing of the right foramina without evidence of neural impingement. No canal stenosis.  L3-L4: Shallow left foraminal protrusion. Mild facet hypertrophy. Mild left foraminal stenosis. No canal stenosis.  L4-L5: Minimal annular disc bulge. Annular fissure in the left foraminal zone. Mild bilateral facet arthropathy. No canal stenosis. Mild-moderate left foraminal stenosis.  L5-S1: Right foraminal disc protrusion. Right greater than left facet hypertrophy. No canal stenosis. Moderate-severe right and borderline-mild left foraminal stenosis.  IMPRESSION: 1. Right foraminal disc protrusion at L5-S1 with moderate-severe right foraminal stenosis. 2. Mild-moderate left foraminal stenosis at L4-L5. 3. Mild left foraminal stenosis at L3-L4. 4. No canal stenosis at any level.   Electronically Signed By: Mabel Converse D.O. On: 08/07/2023 09:02    Complexity Note: Imaging  results reviewed.                         ROS  Cardiovascular: Daily Aspirin intake and High blood pressure Pulmonary or Respiratory: No reported pulmonary signs or symptoms such as wheezing and difficulty taking a deep full breath (Asthma), difficulty blowing air out (Emphysema), coughing up mucus (Bronchitis), persistent dry cough, or temporary stoppage of breathing during sleep Neurological: No reported neurological signs or symptoms such as seizures, abnormal skin sensations, urinary and/or fecal incontinence, being born with an abnormal open spine and/or a tethered spinal cord Psychological-Psychiatric: Anxiousness, Depressed, Prone to panicking, and Difficulty sleeping and or falling asleep Gastrointestinal: Reflux or heatburn and Irregular, infrequent bowel movements (Constipation) Genitourinary: No reported renal or genitourinary signs or symptoms such as difficulty voiding or producing urine, peeing blood, non-functioning kidney, kidney stones, difficulty emptying the bladder, difficulty controlling the flow of urine, or chronic kidney disease Hematological: No reported  hematological signs or symptoms such as prolonged bleeding, low or poor functioning platelets, bruising or bleeding easily, hereditary bleeding problems, low energy levels due to low hemoglobin or being anemic Endocrine: High blood sugar requiring insulin  (IDDM) Rheumatologic: Joint aches and or swelling due to excess weight (Osteoarthritis) and Generalized muscle aches (Fibromyalgia) Musculoskeletal: Negative for myasthenia gravis, muscular dystrophy, multiple sclerosis or malignant hyperthermia Work History: Working full time  Allergies  Ms. Igo is allergic to simvastatin, trazodone  hcl, zetia  [ezetimibe ], atorvastatin, compazine , and penicillins.  Laboratory Chemistry Profile   Renal Lab Results  Component Value Date   BUN 21 06/17/2024   CREATININE 0.93 06/17/2024   LABCREA 164 05/27/2024   BCR 23  06/17/2024   GFRAA 93 02/22/2021   GFRNONAA >60 02/14/2024   SPECGRAV >1.030 (H) 10/14/2021   PHUR 5.5 10/14/2021   PROTEINUR Negative 10/14/2021     Electrolytes Lab Results  Component Value Date   NA 142 06/17/2024   K 4.3 06/17/2024   CL 104 06/17/2024   CALCIUM  9.3 06/17/2024   MG 1.9 08/13/2022     Hepatic Lab Results  Component Value Date   AST 22 05/27/2024   ALT 19 05/27/2024   ALBUMIN 3.2 (L) 02/14/2024   ALKPHOS 66 02/14/2024   LIPASE 49 02/14/2024     ID Lab Results  Component Value Date   SARSCOV2NAA NEGATIVE 02/14/2024     Bone Lab Results  Component Value Date   VD25OH 22 (L) 06/27/2016     Endocrine Lab Results  Component Value Date   GLUCOSE 98 06/17/2024   GLUCOSEU 2+ (A) 10/14/2021   HGBA1C 7.3 (A) 07/21/2024   TSH 3.27 10/08/2023     Neuropathy Lab Results  Component Value Date   VITAMINB12 1,089 10/08/2023   FOLATE 7.4 05/15/2023   HGBA1C 7.3 (A) 07/21/2024     CNS No results found for: COLORCSF, APPEARCSF, RBCCOUNTCSF, WBCCSF, POLYSCSF, LYMPHSCSF, EOSCSF, PROTEINCSF, GLUCCSF, JCVIRUS, CSFOLI, IGGCSF, LABACHR, ACETBL   Inflammation (CRP: Acute  ESR: Chronic) Lab Results  Component Value Date   ESRSEDRATE 2 07/20/2012     Rheumatology No results found for: RF, ANA, LABURIC, URICUR, LYMEIGGIGMAB, LYMEABIGMQN, HLAB27   Coagulation Lab Results  Component Value Date   PLT 278 05/27/2024   DDIMER 0.34 02/14/2024     Cardiovascular Lab Results  Component Value Date   CKTOTAL 287 (H) 07/20/2012   CKMB 3.2 07/20/2012   TROPONINI <0.03 01/26/2019   HGB 12.4 05/27/2024   HCT 38.8 05/27/2024     Screening Lab Results  Component Value Date   SARSCOV2NAA NEGATIVE 02/14/2024     Cancer No results found for: CEA, CA125, LABCA2   Allergens No results found for: ALMOND, APPLE, ASPARAGUS, AVOCADO, BANANA, BARLEY, BASIL, BAYLEAF, GREENBEAN, LIMABEAN,  WHITEBEAN, BEEFIGE, REDBEET, BLUEBERRY, BROCCOLI, CABBAGE, MELON, CARROT, CASEIN, CASHEWNUT, CAULIFLOWER, CELERY     Note: Lab results reviewed.  PFSH  Drug: Ms. Garro  reports no history of drug use. Alcohol:  reports no history of alcohol use. Tobacco:  reports that she has never smoked. She has never used smokeless tobacco. Medical:  has a past medical history of Anxiety, Arthritis (03/2020), Chronic pain of right knee, Chronic sinusitis, DDD (degenerative disc disease), cervical, Depression, Diabetes mellitus, Fibromyalgia, GERD (gastroesophageal reflux disease), High cholesterol, Hyperlipidemia, Hypertension, Insomnia, Muscle pain, Nasal polyp, Neuromuscular disorder (HCC), Obesity, Palpitations, Tendonitis of knee, left (04/2020), Vitamin D  deficiency, and Vitamin D  deficiency. Family: family history includes Alcohol abuse in her daughter and daughter; Breast cancer (age of  onset: 48) in her maternal aunt; Breast cancer (age of onset: 23) in her mother; Cancer (age of onset: 33) in her half-sister; Cancer (age of onset: 79) in her mother; Dementia in her mother; Diabetes in her daughter, father, and mother; Gout in her mother; Kidney cancer in her maternal aunt; Kidney disease in her maternal aunt; Stroke in her maternal uncle.  Past Surgical History:  Procedure Laterality Date   BREAST REDUCTION SURGERY     COLONOSCOPY WITH PROPOFOL  N/A 02/03/2023   Procedure: COLONOSCOPY WITH PROPOFOL ;  Surgeon: Therisa Bi, MD;  Location: [facility] ENDOSCOPY;  Service: Gastroenterology;  Laterality: N/A;   ETHMOIDECTOMY Right 05/16/2020   Procedure: ETHMOIDECTOMY;  Surgeon: Edda Mt, MD;  Location: [facility] ORS;  Service: ENT;  Laterality: Right;   EXCISION ORAL TUMOR N/A 05/16/2020   Procedure: EXCISION OF SOFT PALATE PAPILLOMA;  Surgeon: Edda Mt, MD;  Location: [facility] ORS;  Service: ENT;  Laterality: N/A;   FRONTAL SINUS EXPLORATION Right 05/16/2020   Procedure: FRONTAL SINUS  EXPLORATION;  Surgeon: Edda Mt, MD;  Location: [facility] ORS;  Service: ENT;  Laterality: Right;   IMAGE GUIDED SINUS SURGERY N/A 05/16/2020   Procedure: IMAGE GUIDED SINUS SURGERY;  Surgeon: Edda Mt, MD;  Location: [facility] ORS;  Service: ENT;  Laterality: N/A;   MAXILLARY ANTROSTOMY Bilateral 05/16/2020   Procedure: MAXILLARY ANTROSTOMY with tissue removal;  Surgeon: Edda Mt, MD;  Location: [facility] ORS;  Service: ENT;  Laterality: Bilateral;   nasal endoscopic     OOPHORECTOMY  ?   one ovary removed    REDUCTION MAMMAPLASTY Bilateral 2000   removal of ovary     SPHENOIDECTOMY Right 05/16/2020   Procedure: SPHENOIDECTOMY;  Surgeon: Edda Mt, MD;  Location: [facility] ORS;  Service: ENT;  Laterality: Right;   Active Ambulatory Problems    Diagnosis Date Noted   Primary osteoarthritis of right knee 06/17/2015   Depression, major, recurrent, mild (HCC) 06/17/2015   Hyperlipidemia 06/17/2015   Benign hypertension 06/17/2015   Morbid obesity with BMI of 40.0-44.9, adult (HCC) 06/17/2015   Vitamin D  deficiency 06/17/2015   Muscle spasms of neck 06/19/2015   Insomnia 06/19/2015   Seasonal allergic rhinitis 09/26/2015   Venous vascular malformations 03/27/2016   Fibromyalgia 12/31/2018   History of marijuana use 03/30/2019   Hypertension associated with diabetes (HCC) 08/31/2020   Dyslipidemia associated with type 2 diabetes mellitus (HCC) 05/21/2021   Occult blood positive stool 02/03/2023   Adenomatous polyp of colon 02/03/2023   B12 deficiency 05/15/2023   Herniated intervertebral disc of lumbar spine 08/07/2023   Current mild episode of major depressive disorder without prior episode (HCC) 02/08/2024   Anxiety disorder 02/08/2024   Major depressive disorder, recurrent episode, moderate (HCC) 04/13/2024   Caregiver burden 06/10/2024   Lumbar spondylosis 07/26/2024   Chronic radicular lumbar pain 07/26/2024   Rotator cuff arthropathy of both shoulders (left>right) 07/26/2024    Chronic pain syndrome 07/26/2024   Resolved Ambulatory Problems    Diagnosis Date Noted   Diabetes mellitus type 2, uncontrolled 06/17/2015   Dermatophytosis of groin 06/17/2015   Diabetic peripheral neuropathy associated with type 2 diabetes mellitus (HCC) 09/26/2015   Non compliance w medication regimen 09/26/2015   Acute recurrent maxillary sinusitis 11/04/2019   Past Medical History:  Diagnosis Date   Anxiety    Arthritis 03/2020   Chronic pain of right knee    Chronic sinusitis    DDD (degenerative disc disease), cervical    Depression    Diabetes mellitus  GERD (gastroesophageal reflux disease)    High cholesterol    Hypertension    Muscle pain    Nasal polyp    Neuromuscular disorder (HCC)    Obesity    Palpitations    Tendonitis of knee, left 04/2020   Constitutional Exam  General appearance: Well nourished, well developed, and well hydrated. In no apparent acute distress Vitals:   07/26/24 0815  BP: (!) 150/88  Pulse: 72  Resp: 16  Temp: (!) 97.4 F (36.3 C)  SpO2: 100%  Weight: 245 lb (111.1 kg)  Height: 5' 4 (1.626 m)   BMI Assessment: Estimated body mass index is 42.05 kg/m as calculated from the following:   Height as of this encounter: 5' 4 (1.626 m).   Weight as of this encounter: 245 lb (111.1 kg).  BMI interpretation table: BMI level Category Range association with higher incidence of chronic pain  <18 kg/m2 Underweight   18.5-24.9 kg/m2 Ideal body weight   25-29.9 kg/m2 Overweight Increased incidence by 20%  30-34.9 kg/m2 Obese (Class I) Increased incidence by 68%  35-39.9 kg/m2 Severe obesity (Class II) Increased incidence by 136%  >40 kg/m2 Extreme obesity (Class III) Increased incidence by 254%   Patient's current BMI Ideal Body weight  Body mass index is 42.05 kg/m. Ideal body weight: 54.7 kg (120 lb 9.5 oz) Adjusted ideal body weight: 77.3 kg (170 lb 5.7 oz)   BMI Readings from Last 4 Encounters:  07/26/24 42.05 kg/m  07/21/24  41.90 kg/m  06/17/24 41.88 kg/m  05/27/24 42.26 kg/m   Wt Readings from Last 4 Encounters:  07/26/24 245 lb (111.1 kg)  07/21/24 244 lb 1.6 oz (110.7 kg)  06/17/24 244 lb (110.7 kg)  05/27/24 246 lb 3.2 oz (111.7 kg)    Psych/Mental status: Alert, oriented x 3 (person, place, & time)       Eyes: PERLA Respiratory: No evidence of acute respiratory distress  Cervical Spine Area Exam  Skin & Axial Inspection: No masses, redness, edema, swelling, or associated skin lesions Alignment: Symmetrical Functional ROM: Unrestricted ROM      Stability: No instability detected Muscle Tone/Strength: Functionally intact. No obvious neuro-muscular anomalies detected. Sensory (Neurological): Unimpaired Palpation: No palpable anomalies             Upper Extremity (UE) Exam    Side: Right upper extremity  Side: Left upper extremity  Skin & Extremity Inspection: Skin color, temperature, and hair growth are WNL. No peripheral edema or cyanosis. No masses, redness, swelling, asymmetry, or associated skin lesions. No contractures.  Skin & Extremity Inspection: Skin color, temperature, and hair growth are WNL. No peripheral edema or cyanosis. No masses, redness, swelling, asymmetry, or associated skin lesions. No contractures.  Functional ROM: Pain restricted ROM for shoulder and elbow  Functional ROM: Pain restricted ROM for shoulder and elbow  Muscle Tone/Strength: Functionally intact. No obvious neuro-muscular anomalies detected.  Muscle Tone/Strength: Functionally intact. No obvious neuro-muscular anomalies detected.  Sensory (Neurological): Arthropathic arthralgia          Sensory (Neurological): Arthropathic arthralgia          Palpation: No palpable anomalies              Palpation: No palpable anomalies              Provocative Test(s):  Phalen's test: deferred Tinel's test: deferred Apley's scratch test (touch opposite shoulder):  Action 1 (Across chest): Decreased ROM Action 2 (Overhead):  Decreased ROM Action 3 (LB reach): Decreased ROM  Provocative Test(s):  Phalen's test: deferred Tinel's test: deferred Apley's scratch test (touch opposite shoulder):  Action 1 (Across chest): Decreased ROM Action 2 (Overhead): Decreased ROM Action 3 (LB reach): Decreased ROM    Lumbar Spine Area Exam  Skin & Axial Inspection: No masses, redness, or swelling Alignment: Symmetrical Functional ROM: Pain restricted ROM affecting both sides Stability: No instability detected Muscle Tone/Strength: Functionally intact. No obvious neuro-muscular anomalies detected. Sensory (Neurological): Musculoskeletal pain pattern Palpation: No palpable anomalies       Provocative Tests: Hyperextension/rotation test: (+) bilaterally for facet joint pain. Lumbar quadrant test (Kemp's test): (+) bilaterally for facet joint pain.  Gait & Posture Assessment  Ambulation: Unassisted Gait: Relatively normal for age and body habitus Posture: WNL  Lower Extremity Exam    Side: Right lower extremity  Side: Left lower extremity  Stability: No instability observed          Stability: No instability observed          Skin & Extremity Inspection: Skin color, temperature, and hair growth are WNL. No peripheral edema or cyanosis. No masses, redness, swelling, asymmetry, or associated skin lesions. No contractures.  Skin & Extremity Inspection: Skin color, temperature, and hair growth are WNL. No peripheral edema or cyanosis. No masses, redness, swelling, asymmetry, or associated skin lesions. No contractures.  Functional ROM: Unrestricted ROM                  Functional ROM: Unrestricted ROM                  Muscle Tone/Strength: Functionally intact. No obvious neuro-muscular anomalies detected.  Muscle Tone/Strength: Functionally intact. No obvious neuro-muscular anomalies detected.  Sensory (Neurological): Unimpaired        Sensory (Neurological): Unimpaired        DTR: Patellar: deferred today Achilles:  deferred today Plantar: deferred today  DTR: Patellar: deferred today Achilles: deferred today Plantar: deferred today  Palpation: No palpable anomalies  Palpation: No palpable anomalies    Assessment  Primary Diagnosis & Pertinent Problem List: The primary encounter diagnosis was Lumbar facet arthropathy. Diagnoses of Lumbar spondylosis, Chronic radicular lumbar pain (right L5/S1 disc herniation), Rotator cuff arthropathy of both shoulders (left>right), Fibromyalgia, Major depressive disorder, recurrent episode, moderate (HCC), and Chronic pain syndrome were also pertinent to this visit.  Visit Diagnosis (New problems to examiner): 1. Lumbar facet arthropathy   2. Lumbar spondylosis   3. Chronic radicular lumbar pain (right L5/S1 disc herniation)   4. Rotator cuff arthropathy of both shoulders (left>right)   5. Fibromyalgia   6. Major depressive disorder, recurrent episode, moderate (HCC)   7. Chronic pain syndrome    Plan of Care (Initial workup plan)  Assessment and Plan    Low back pain  due to lumbar disc herniation and lumbar spondylosis   Lindsay Mcdonald has a history of greater than 3 months of moderate to severe pain which is resulted in functional impairment.  The patient has tried various conservative therapeutic options such as NSAIDs, Tylenol , muscle relaxants, physical therapy which was inadequately effective.  Patient's pain is predominantly axial with physical exam and L-MRI findings suggestive of facet arthropathy. Lumbar facet medial branch nerve blocks were discussed with the patient.  Risks and benefits were reviewed.  Patient would like to proceed with bilateral L3, L4, L5 medial branch nerve block.  Muscle spasms of back   Muscle spasms are secondary to underlying pain conditions such as arthritis and sciatica. Neurology manages with muscle relaxers,  but breakthrough pain occurs. Consider magnesium glycinate 200-400 mg at night for muscle spasms. Ensure adequate  hydration and electrolyte intake.  Bilateral rotator cuff tear with shoulder pain   Bilateral shoulder pain with torn rotator cuffs is more severe on the left side despite a smaller tear. Previous steroid injections elevated blood sugar levels due to type 2 diabetes. Plan for suprascapular nerve block and potential ablation after back treatment. Monitor blood sugar levels due to diabetes.  Fibromyalgia   Fibromyalgia amplifies pain experience. Management of underlying arthritis and sciatica may improve overall fibromyalgia symptoms. Tramadol is somewhat effective for fibromyalgia pain. Continue tramadol as needed for fibromyalgia pain.        Procedure Orders         LUMBAR FACET(MEDIAL BRANCH NERVE BLOCK) MBNB      Provider-requested follow-up: Return in about 8 days (around 08/03/2024) for B/L L3,4,5 MBNB, in clinic [facility].  Future Appointments  Date Time Provider Department Center  08/03/2024  8:00 AM Veva Almarie [facility], [facility] None  08/19/2024  8:30 AM Eappen, Saramma, MD [facility] None  10/19/2024  3:40 PM Glenard Krichna, MD [facility]   I discussed the assessment and treatment plan with the patient. The patient was provided an opportunity to ask questions and all were answered. The patient agreed with the plan and demonstrated an understanding of the instructions.  Patient advised to call back or seek an in-person evaluation if the symptoms or condition worsens.  Duration of encounter: .  Total time on encounter, as per AMA guidelines included both the face-to-face and non-face-to-face time personally spent by the physician and/or other qualified health care professional(s) on the day of the encounter (includes time in activities that require the physician or other qualified health care professional and does not include time in activities normally performed by clinical staff). Physician's time may include the following activities when performed: Preparing to see the  patient (e.g., pre-charting review of records, searching for previously ordered imaging, lab work, and nerve conduction tests) Review of prior analgesic pharmacotherapies. Reviewing PMP Interpreting ordered tests (e.g., lab work, imaging, nerve conduction tests) Performing post-procedure evaluations, including interpretation of diagnostic procedures Obtaining and/or reviewing separately obtained history Performing a medically appropriate examination and/or evaluation Counseling and educating the patient/family/caregiver Ordering medications, tests, or procedures Referring and communicating with other health care professionals (when not separately reported) Documenting clinical information in the electronic or other health record Independently interpreting results (not separately reported) and communicating results to the patient/ family/caregiver Care coordination (not separately reported)  Note by: Wallie Sherry, MD (TTS and AI technology used. I apologize for any typographical errors that were not detected and corrected.) Date: 07/26/2024; Time: 9:54 AM

## 2024-07-26 NOTE — Patient Instructions (Signed)
GENERAL RISKS AND COMPLICATIONS ° °What are the risk, side effects and possible complications? °Generally speaking, most procedures are safe.  However, with any procedure there are risks, side effects, and the possibility of complications.  The risks and complications are dependent upon the sites that are lesioned, or the type of nerve block to be performed.  The closer the procedure is to the spine, the more serious the risks are.  Great care is taken when placing the radio frequency needles, block needles or lesioning probes, but sometimes complications can occur. °Infection: Any time there is an injection through the skin, there is a risk of infection.  This is why sterile conditions are used for these blocks.  There are four possible types of infection. °Localized skin infection. °Central Nervous System Infection-This can be in the form of Meningitis, which can be deadly. °Epidural Infections-This can be in the form of an epidural abscess, which can cause pressure inside of the spine, causing compression of the spinal cord with subsequent paralysis. This would require an emergency surgery to decompress, and there are no guarantees that the patient would recover from the paralysis. °Discitis-This is an infection of the intervertebral discs.  It occurs in about 1% of discography procedures.  It is difficult to treat and it may lead to surgery. ° °      2. Pain: the needles have to go through skin and soft tissues, will cause soreness. °      3. Damage to internal structures:  The nerves to be lesioned may be near blood vessels or   ° other nerves which can be potentially damaged. °      4. Bleeding: Bleeding is more common if the patient is taking blood thinners such as  aspirin, Coumadin, Ticiid, Plavix, etc., or if he/she have some genetic predisposition  such as hemophilia. Bleeding into the spinal canal can cause compression of the spinal  cord with subsequent paralysis.  This would require an emergency  surgery to  decompress and there are no guarantees that the patient would recover from the  paralysis. °      5. Pneumothorax:  Puncturing of a lung is a possibility, every time a needle is introduced in  the area of the chest or upper back.  Pneumothorax refers to free air around the  collapsed lung(s), inside of the thoracic cavity (chest cavity).  Another two possible  complications related to a similar event would include: Hemothorax and Chylothorax.   These are variations of the Pneumothorax, where instead of air around the collapsed  lung(s), you may have blood or chyle, respectively. °      6. Spinal headaches: They may occur with any procedures in the area of the spine. °      7. Persistent CSF (Cerebro-Spinal Fluid) leakage: This is a rare problem, but may occur  with prolonged intrathecal or epidural catheters either due to the formation of a fistulous  track or a dural tear. °      8. Nerve damage: By working so close to the spinal cord, there is always a possibility of  nerve damage, which could be as serious as a permanent spinal cord injury with  paralysis. °      9. Death:  Although rare, severe deadly allergic reactions known as "Anaphylactic  reaction" can occur to any of the medications used. °     10. Worsening of the symptoms:  We can always make thing worse. ° °What are the chances   of something like this happening? °Chances of any of this occuring are extremely low.  By statistics, you have more of a chance of getting killed in a motor vehicle accident: while driving to the hospital than any of the above occurring .  Nevertheless, you should be aware that they are possibilities.  In general, it is similar to taking a shower.  Everybody knows that you can slip, hit your head and get killed.  Does that mean that you should not shower again?  Nevertheless always keep in mind that statistics do not mean anything if you happen to be on the wrong side of them.  Even if a procedure has a 1 (one) in a  1,000,000 (million) chance of going wrong, it you happen to be that one..Also, keep in mind that by statistics, you have more of a chance of having something go wrong when taking medications. ° °Who should not have this procedure? °If you are on a blood thinning medication (e.g. Coumadin, Plavix, see list of "Blood Thinners"), or if you have an active infection going on, you should not have the procedure.  If you are taking any blood thinners, please inform your physician. ° °How should I prepare for this procedure? °Do not eat or drink anything at least six hours prior to the procedure. °Bring a driver with you .  It cannot be a taxi. °Come accompanied by an adult that can drive you back, and that is strong enough to help you if your legs get weak or numb from the local anesthetic. °Take all of your medicines the morning of the procedure with just enough water to swallow them. °If you have diabetes, make sure that you are scheduled to have your procedure done first thing in the morning, whenever possible. °If you have diabetes, take only half of your insulin dose and notify our nurse that you have done so as soon as you arrive at the clinic. °If you are diabetic, but only take blood sugar pills (oral hypoglycemic), then do not take them on the morning of your procedure.  You may take them after you have had the procedure. °Do not take aspirin or any aspirin-containing medications, at least eleven (11) days prior to the procedure.  They may prolong bleeding. °Wear loose fitting clothing that may be easy to take off and that you would not mind if it got stained with Betadine or blood. °Do not wear any jewelry or perfume °Remove any nail coloring.  It will interfere with some of our monitoring equipment. ° °NOTE: Remember that this is not meant to be interpreted as a complete list of all possible complications.  Unforeseen problems may occur. ° °BLOOD THINNERS °The following drugs contain aspirin or other products,  which can cause increased bleeding during surgery and should not be taken for 2 weeks prior to and 1 week after surgery.  If you should need take something for relief of minor pain, you may take acetaminophen which is found in Tylenol,m Datril, Anacin-3 and Panadol. It is not blood thinner. The products listed below are.  Do not take any of the products listed below in addition to any listed on your instruction sheet. ° °A.P.C or A.P.C with Codeine Codeine Phosphate Capsules #3 Ibuprofen Ridaura  °ABC compound Congesprin Imuran rimadil  °Advil Cope Indocin Robaxisal  °Alka-Seltzer Effervescent Pain Reliever and Antacid Coricidin or Coricidin-D ° Indomethacin Rufen  °Alka-Seltzer plus Cold Medicine Cosprin Ketoprofen S-A-C Tablets  °Anacin Analgesic Tablets or Capsules Coumadin   Korlgesic Salflex  Anacin Extra Strength Analgesic tablets or capsules CP-2 Tablets Lanoril Salicylate  Anaprox Cuprimine Capsules Levenox Salocol  Anexsia-D Dalteparin Magan Salsalate  Anodynos Darvon compound Magnesium Salicylate Sine-off  Ansaid Dasin Capsules Magsal Sodium Salicylate  Anturane Depen Capsules Marnal Soma  APF Arthritis pain formula Dewitt's Pills Measurin Stanback  Argesic Dia-Gesic Meclofenamic Sulfinpyrazone  Arthritis Bayer Timed Release Aspirin Diclofenac Meclomen Sulindac  Arthritis pain formula Anacin Dicumarol Medipren Supac  Analgesic (Safety coated) Arthralgen Diffunasal Mefanamic Suprofen  Arthritis Strength Bufferin Dihydrocodeine Mepro Compound Suprol  Arthropan liquid Dopirydamole Methcarbomol with Aspirin Synalgos  ASA tablets/Enseals Disalcid Micrainin Tagament  Ascriptin Doan's Midol Talwin  Ascriptin A/D Dolene Mobidin Tanderil  Ascriptin Extra Strength Dolobid Moblgesic Ticlid  Ascriptin with Codeine Doloprin or Doloprin with Codeine Momentum Tolectin  Asperbuf Duoprin Mono-gesic Trendar  Aspergum Duradyne Motrin or Motrin IB Triminicin  Aspirin plain, buffered or enteric coated  Durasal Myochrisine Trigesic  Aspirin Suppositories Easprin Nalfon Trillsate  Aspirin with Codeine Ecotrin Regular or Extra Strength Naprosyn Uracel  Atromid-S Efficin Naproxen Ursinus  Auranofin Capsules Elmiron Neocylate Vanquish  Axotal Emagrin Norgesic Verin  Azathioprine Empirin or Empirin with Codeine Normiflo Vitamin E  Azolid Emprazil Nuprin Voltaren  Bayer Aspirin plain, buffered or children's or timed BC Tablets or powders Encaprin Orgaran Warfarin Sodium  Buff-a-Comp Enoxaparin Orudis Zorpin  Buff-a-Comp with Codeine Equegesic Os-Cal-Gesic   Buffaprin Excedrin plain, buffered or Extra Strength Oxalid   Bufferin Arthritis Strength Feldene Oxphenbutazone   Bufferin plain or Extra Strength Feldene Capsules Oxycodone with Aspirin   Bufferin with Codeine Fenoprofen Fenoprofen Pabalate or Pabalate-SF   Buffets II Flogesic Panagesic   Buffinol plain or Extra Strength Florinal or Florinal with Codeine Panwarfarin   Buf-Tabs Flurbiprofen Penicillamine   Butalbital Compound Four-way cold tablets Penicillin   Butazolidin Fragmin Pepto-Bismol   Carbenicillin Geminisyn Percodan   Carna Arthritis Reliever Geopen Persantine   Carprofen Gold's salt Persistin   Chloramphenicol Goody's Phenylbutazone   Chloromycetin Haltrain Piroxlcam   Clmetidine heparin Plaquenil   Cllnoril Hyco-pap Ponstel   Clofibrate Hydroxy chloroquine Propoxyphen         Before stopping any of these medications, be sure to consult the physician who ordered them.  Some, such as Coumadin (Warfarin) are ordered to prevent or treat serious conditions such as "deep thrombosis", "pumonary embolisms", and other heart problems.  The amount of time that you may need off of the medication may also vary with the medication and the reason for which you were taking it.  If you are taking any of these medications, please make sure you notify your pain physician before you undergo any procedures.         Facet Blocks Patient  Information  Description: The facets are joints in the spine between the vertebrae.  Like any joints in the body, facets can become irritated and painful.  Arthritis can also effect the facets.  By injecting steroids and local anesthetic in and around these joints, we can temporarily block the nerve supply to them.  Steroids act directly on irritated nerves and tissues to reduce selling and inflammation which often leads to decreased pain.  Facet blocks may be done anywhere along the spine from the neck to the low back depending upon the location of your pain.   After numbing the skin with local anesthetic (like Novocaine), a small needle is passed onto the facet joints under x-ray guidance.  You may experience a sensation of pressure while this is being done.  The   entire block usually lasts about 15-25 minutes.   Conditions which may be treated by facet blocks:  Low back/buttock pain Neck/shoulder pain Certain types of headaches  Preparation for the injection:  Do not eat any solid food or dairy products within 8 hours of your appointment. You may drink clear liquid up to 3 hours before appointment.  Clear liquids include water, black coffee, juice or soda.  No milk or cream please. You may take your regular medication, including pain medications, with a sip of water before your appointment.  Diabetics should hold regular insulin (if taken separately) and take 1/2 normal NPH dose the morning of the procedure.  Carry some sugar containing items with you to your appointment. A driver must accompany you and be prepared to drive you home after your procedure. Bring all your current medications with you. An IV may be inserted and sedation may be given at the discretion of the physician. A blood pressure cuff, EKG and other monitors will often be applied during the procedure.  Some patients may need to have extra oxygen administered for a short period. You will be asked to provide medical information,  including your allergies and medications, prior to the procedure.  We must know immediately if you are taking blood thinners (like Coumadin/Warfarin) or if you are allergic to IV iodine contrast (dye).  We must know if you could possible be pregnant.  Possible side-effects:  Bleeding from needle site Infection (rare, may require surgery) Nerve injury (rare) Numbness & tingling (temporary) Difficulty urinating (rare, temporary) Spinal headache (a headache worse with upright posture) Light-headedness (temporary) Pain at injection site (serveral days) Decreased blood pressure (rare, temporary) Weakness in arm/leg (temporary) Pressure sensation in back/neck (temporary)   Call if you experience:  Fever/chills associated with headache or increased back/neck pain Headache worsened by an upright position New onset, weakness or numbness of an extremity below the injection site Hives or difficulty breathing (go to the emergency room) Inflammation or drainage at the injection site(s) Severe back/neck pain greater than usual New symptoms which are concerning to you  Please note:  Although the local anesthetic injected can often make your back or neck feel good for several hours after the injection, the pain will likely return. It takes 3-7 days for steroids to work.  You may not notice any pain relief for at least one week.  If effective, we will often do a series of 2-3 injections spaced 3-6 weeks apart to maximally decrease your pain.  After the initial series, you may be a candidate for a more permanent nerve block of the facets.  If you have any questions, please call [phone] [facility] 

## 2024-07-26 NOTE — Progress Notes (Signed)
 Safety precautions to be maintained throughout the outpatient stay will include: orient to surroundings, keep bed in low position, maintain call bell within reach at all times, provide assistance with transfer out of bed and ambulation.

## 2024-08-02 ENCOUNTER — Ambulatory Visit: Admitting: Medical

## 2024-08-03 ENCOUNTER — Ambulatory Visit: Admitting: Professional Counselor

## 2024-08-03 DIAGNOSIS — Z636 Dependent relative needing care at home: Secondary | ICD-10-CM

## 2024-08-03 DIAGNOSIS — F411 Generalized anxiety disorder: Secondary | ICD-10-CM | POA: Diagnosis not present

## 2024-08-03 DIAGNOSIS — F331 Major depressive disorder, recurrent, moderate: Secondary | ICD-10-CM

## 2024-08-03 NOTE — Progress Notes (Signed)
 THERAPIST PROGRESS NOTE  Virtual Visit via Video Note  I connected with [name] on 08/03/24 at  8:00 AM EDT by a video enabled telemedicine application and verified that I am speaking with the correct person using two identifiers.  Location: Patient: Community (parked car) Provider: Remote office   I discussed the limitations of evaluation and management by telemedicine and the availability of in person appointments. The patient expressed understanding and agreed to proceed.   I discussed the assessment and treatment plan with the patient. The patient was provided an opportunity to ask questions and all were answered. The patient agreed with the plan and demonstrated an understanding of the instructions.   The patient was advised to call back or seek an in-person evaluation if the symptoms worsen or if the condition fails to improve as anticipated.  I provided 53 minutes of non-face-to-face time during this encounter. [name], [facility]  Session Time: 8:01 AM - 8:54 AM   Participation Level: Active  Behavioral Response: Well Groomed, Alert, Anxious  Type of Therapy: Individual Therapy  Treatment Goals addressed:  Active OP Depression  LTG: I'd like to be able to make decisions and have clarity and feel good about my decisions. I'd like to also feel better with relationships. How to manage stress.                 Start:  05/25/24    Expected End:  05/24/25      STG: Stress management. To improve stress management AEB reduction in mood symptoms by practicing coping skills over the next 12 weeks    STG: Self-care, self-love, I'm kind of hard on myself. To improve sense of self AEB self-report in increase of self-care activities and restructuring maladaptive patterns of thinking over the next 90 days.     STG: I just want to be able to control that more. (Bringing up graphic images like traumatic things seen on TV). To reduce distress brought on by OCD  tendencies AEB self-report reduction in obsessive and compulsive behaviors with exposure and CBT.    ProgressTowards Goals: Progressing  Interventions: CBT, Solution Focused, Assertiveness Training, and Supportive  Summary: Zaryah Seckel is a 63 y.o. female who presents with a history of anxiety and depression. She appeared alert and oriented x5. She shared updates about her mother and her mother's care. Evana inquired about ways to regulate her emotions and communicate her needs better. She was receptive to [employer]. She engaged in completing ABC worksheets and can see how her thinking may be contributing to her negative emotions. Alisa noted support groups would be helpful.   Therapist Response: Conducted session with Alisa. Began session with check-in/update since previous session. Utilized empathetic and reflective listening. Used open-ended questions to facilitate discussion and summarized Jule's thoughts/feelings. Explained and modeled DEAR MAN skill to assist with objective effectiveness. Completed ABC worksheets with Aleesha to help build awareness about thinking and how it contributes to emotions. Emailed copies of handouts, along with resources for caregiver support groups. Scheduled additional appointment and concluded session.   Suicidal/Homicidal: No  Plan: Return again in 5 weeks.  Diagnosis: Generalized anxiety disorder  Major depressive disorder, recurrent episode, moderate (HCC)  Caregiver burden  Collaboration of Care: Medication Management AEB chart review  Patient/Guardian was advised Release of Information must be obtained prior to any record release in order to collaborate their care with an outside provider. Patient/Guardian was advised if they have not already done so to contact the registration  department to sign all necessary forms in order for us  to release information regarding their care.   Consent: Patient/Guardian gives verbal consent for treatment and  assignment of benefits for services provided during this visit. Patient/Guardian expressed understanding and agreed to proceed.   [name], [facility] 08/03/2024

## 2024-08-05 ENCOUNTER — Ambulatory Visit: Admitting: Family Medicine

## 2024-08-08 ENCOUNTER — Telehealth: Payer: Self-pay | Admitting: Family Medicine

## 2024-08-08 ENCOUNTER — Encounter: Payer: Self-pay | Admitting: Family Medicine

## 2024-08-08 DIAGNOSIS — E1169 Type 2 diabetes mellitus with other specified complication: Secondary | ICD-10-CM

## 2024-08-08 MED ORDER — MOUNJARO 12.5 MG/0.5ML ~~LOC~~ SOAJ: 12.5000 mg | SUBCUTANEOUS | 0 refills | Status: DC

## 2024-08-08 NOTE — Telephone Encounter (Signed)
 olmesartan  (BENICAR ) 40 MG tablet

## 2024-08-08 NOTE — Telephone Encounter (Signed)
 Patient has enough till Nov 2025

## 2024-08-10 ENCOUNTER — Encounter: Payer: Self-pay | Admitting: Student in an Organized Health Care Education/Training Program

## 2024-08-10 ENCOUNTER — Ambulatory Visit (HOSPITAL_BASED_OUTPATIENT_CLINIC_OR_DEPARTMENT_OTHER): Admitting: Student in an Organized Health Care Education/Training Program

## 2024-08-10 ENCOUNTER — Telehealth: Payer: Self-pay

## 2024-08-10 ENCOUNTER — Ambulatory Visit
Admission: RE | Admit: 2024-08-10 | Discharge: 2024-08-10 | Disposition: A | Discharge status: Home / Self Care | Source: Ambulatory Visit | Attending: Student in an Organized Health Care Education/Training Program | Admitting: Student in an Organized Health Care Education/Training Program

## 2024-08-10 DIAGNOSIS — M47816 Spondylosis without myelopathy or radiculopathy, lumbar region: Secondary | ICD-10-CM | POA: Insufficient documentation

## 2024-08-10 DIAGNOSIS — G894 Chronic pain syndrome: Secondary | ICD-10-CM | POA: Diagnosis not present

## 2024-08-10 DIAGNOSIS — M545 Low back pain, unspecified: Secondary | ICD-10-CM | POA: Diagnosis present

## 2024-08-10 DIAGNOSIS — F411 Generalized anxiety disorder: Secondary | ICD-10-CM

## 2024-08-10 MED ORDER — DEXAMETHASONE SODIUM PHOSPHATE 10 MG/ML IJ SOLN
INTRAMUSCULAR | Status: AC
  Filled 2024-08-10: qty 2

## 2024-08-10 MED ORDER — LIDOCAINE HCL 2 % IJ SOLN
INTRAMUSCULAR | Status: AC
  Filled 2024-08-10: qty 20

## 2024-08-10 MED ORDER — LIDOCAINE HCL 2 % IJ SOLN
20.0000 mL | Freq: Once | INTRAMUSCULAR | Status: AC
  Administered 2024-08-10: 400 mg

## 2024-08-10 MED ORDER — VILAZODONE HCL 20 MG PO TABS: 20.0000 mg | ORAL_TABLET | Freq: Every day | ORAL | 0 refills | Status: DC

## 2024-08-10 MED ORDER — ROPIVACAINE HCL 2 MG/ML IJ SOLN
INTRAMUSCULAR | Status: AC
  Filled 2024-08-10: qty 20

## 2024-08-10 MED ORDER — HYDROXYZINE PAMOATE 25 MG PO CAPS: 25.0000 mg | ORAL_CAPSULE | Freq: Every day | ORAL | 0 refills | Status: DC | PRN

## 2024-08-10 MED ORDER — ROPIVACAINE HCL 2 MG/ML IJ SOLN
18.0000 mL | Freq: Once | INTRAMUSCULAR | Status: AC
  Administered 2024-08-10: 18 mL via PERINEURAL

## 2024-08-10 NOTE — Progress Notes (Signed)
 PROVIDER NOTE: Interpretation of information contained herein should be left to medically-trained personnel. Specific patient instructions are provided elsewhere under Patient Instructions section of medical record. This document was created in part using STT-dictation technology, any transcriptional errors that may result from this process are unintentional.  Patient: Lindsay Mcdonald Type: Established DOB: 02/17/1961 MRN: [id number] PCP: Glenard Mire, MD  Service: Procedure DOS: 08/10/2024 Setting: Ambulatory Location: Ambulatory outpatient facility Delivery: Face-to-face Provider: Wallie Sherry, MD Specialty: Interventional Pain Management Specialty designation: 09 Location: Outpatient facility Ref. Prov.: Sherry Wallie, MD       Interventional Therapy   Type: Lumbar Facet, Medial Branch Block(s) (w/ fluoroscopic mapping) #1  Laterality: Bilateral  Level: L3, L4, and L5 Medial Branch/Dorsal Rami Level(s). Injecting these levels blocks the L3-4 and L4-5 lumbar facet joints. Imaging: Fluoroscopic guidance Spinal ([id number]) Anesthesia: Local anesthesia (1-2% Lidocaine ) Sedation: No Sedation                       DOS: 08/10/2024 Performed by: Wallie Sherry, MD  Primary Purpose: Diagnostic/Therapeutic Indications: Low back pain severe enough to impact quality of life or function. 1. Lumbar facet arthropathy   2. Lumbar spondylosis   3. Chronic pain syndrome    NAS-11 Pain score:   Pre-procedure: 10-Worst pain ever/10   Post-procedure: 0-No pain/10     Position / Prep / Materials:  Position: Prone  Prep solution: ChloraPrep (2% chlorhexidine  gluconate and 70% isopropyl alcohol) Area Prepped: Posterolateral Lumbosacral Spine (Wide prep: From the lower border of the scapula down to the end of the tailbone and from flank to flank.)  Materials:  Tray: Block Needle(s):  Type: Spinal  Gauge (G): 22  Length: 3.5-in Qty: 3     H&P (Pre-op Assessment):  Lindsay Mcdonald is a 63  y.o. (year old), female patient, seen today for interventional treatment. 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 [name] has a current medication list which includes the following prescription(s): gvoke hypopen  2-pack, olmesartan , accu-chek guide test, accu-chek softclix lancets, airsupra , aspirin ec, baclofen , benzonatate , estradiol -norethindrone, fluticasone , hydroxyzine , toujeo  solostar, insulin  pen needle, lamotrigine , meclizine, melatonin, metformin , metoprolol  tartrate, montelukast , naproxen , mounjaro , tramadol, and vilazodone  hcl. Her primarily concern today is the Back Pain (lower)  Initial Vital Signs:  Pulse/HCG Rate: 83ECG Heart Rate: 82 Temp: (!) 97.5 F (36.4 C) Resp: 18 BP: 131/70 SpO2: 99 %  BMI: Estimated body mass index is 42.91 kg/m as calculated from the following:   Height as of this encounter: 5' 4 (1.626 m).   Weight as of this encounter: 250 lb (113.4 kg).  Risk Assessment: Allergies: Reviewed. She is allergic to simvastatin, trazodone  hcl, zetia  [ezetimibe ], atorvastatin, compazine , and penicillins.  Allergy Precautions: None required Coagulopathies: Reviewed. None identified.  Blood-thinner therapy: None at this time Active Infection(s): Reviewed. None identified. [name] is afebrile  Site Confirmation: Ms. Pillard was asked to confirm the procedure and laterality before marking the site Procedure checklist: Completed Consent: Before the procedure and under the influence of no sedative(s), amnesic(s), or anxiolytics, the patient was  informed of the treatment options, risks and possible complications. To fulfill our ethical and legal obligations, as recommended by the American Medical Association's Code of Ethics, I have informed  the patient of my clinical impression; the nature and purpose of the treatment or procedure; the risks, benefits, and possible complications of the intervention; the alternatives, including doing nothing; the risk(s) and benefit(s) of the alternative treatment(s) or procedure(s); and the risk(s) and benefit(s) of doing nothing. The patient was provided information about the general risks and possible complications associated with the procedure. These may include, but are not limited to: failure to achieve desired goals, infection, bleeding, organ or nerve damage, allergic reactions, paralysis, and death. In addition, the patient was informed of those risks and complications associated to Spine-related procedures, such as failure to decrease pain; infection (i.e.: Meningitis, epidural or intraspinal abscess); bleeding (i.e.: epidural hematoma, subarachnoid hemorrhage, or any other type of intraspinal or peri-dural bleeding); organ or nerve damage (i.e.: Any type of peripheral nerve, nerve root, or spinal cord injury) with subsequent damage to sensory, motor, and/or autonomic systems, resulting in permanent pain, numbness, and/or weakness of one or several areas of the body; allergic reactions; (i.e.: anaphylactic reaction); and/or death. Furthermore, the patient was informed of those risks and complications associated with the medications. These include, but are not limited to: allergic reactions (i.e.: anaphylactic or anaphylactoid reaction(s)); adrenal axis suppression; blood sugar elevation that in diabetics may result in ketoacidosis or comma; water  retention that in patients with history of congestive heart failure may result in shortness of breath, pulmonary edema, and decompensation with resultant heart  failure; weight gain; swelling or edema; medication-induced neural toxicity; particulate matter embolism and blood vessel occlusion with resultant organ, and/or nervous system infarction; and/or aseptic necrosis of one or more joints. Finally, the patient was informed that Medicine is not an exact science; therefore, there is also the possibility of unforeseen or unpredictable risks and/or possible complications that may result in a catastrophic outcome. The patient indicated having understood very clearly. We have given the patient no guarantees and we have made no promises. Enough time was given to the patient to ask questions, all of which were answered to the patient's satisfaction. Ms. Arps has indicated that she wanted to continue with the procedure. Attestation: I, the ordering provider, attest that I have discussed with the patient the benefits, risks, side-effects, alternatives, likelihood of achieving goals, and potential problems during recovery for the procedure that I have provided informed consent. Date  Time: 08/10/2024  1:29 PM  Pre-Procedure Preparation:  Monitoring: As per clinic protocol. Respiration, ETCO2, SpO2, BP, heart rate and rhythm monitor placed and checked for adequate function Safety Precautions: Patient was assessed for positional comfort and pressure points before starting the procedure. Time-out: I initiated and conducted the Time-out before starting the procedure, as per protocol. The patient was asked to participate by confirming the accuracy of the Time Out information. Verification of the correct person, site, and procedure were performed and confirmed by me, the nursing staff, and the patient. Time-out conducted as per Joint Commission's Universal Protocol (UP.01.01.01). Time: 1403 Start Time: 1403 hrs.  Description of Procedure:          Laterality: (see above) Targeted Levels: (see above)  Safety Precautions: Aspiration looking for blood return was  conducted prior to all injections. At no point did we inject any substances, as a needle was being advanced. Before injecting, the patient was told to immediately notify me if she was experiencing any new onset of ringing in the ears, or metallic taste in the mouth. No attempts were made at seeking any paresthesias. Safe injection practices and needle disposal techniques used. Medications  properly checked for expiration dates. SDV (single dose vial) medications used. After the completion of the procedure, all disposable equipment used was discarded in the proper designated medical waste containers. Local Anesthesia: Protocol guidelines were followed. The patient was positioned over the fluoroscopy table. The area was prepped in the usual manner. The time-out was completed. The target area was identified using fluoroscopy. A 12-in long, straight, sterile hemostat was used with fluoroscopic guidance to locate the targets for each level blocked. Once located, the skin was marked with an approved surgical skin marker. Once all sites were marked, the skin (epidermis, dermis, and hypodermis), as well as deeper tissues (fat, connective tissue and muscle) were infiltrated with a small amount of a short-acting local anesthetic, loaded on a 10cc syringe with a 25G, 1.5-in  Needle. An appropriate amount of time was allowed for local anesthetics to take effect before proceeding to the next step. Local Anesthetic: Lidocaine  2.0% The unused portion of the local anesthetic was discarded in the proper designated containers. Technical description of process:  Medial Branch  Dorsal Rami Nerve Block (MBB):  Neuroanatomy note: Each lumbar facet joint receives dual innervation from medial branches arising from the posterior primary rami at the same level and one level above. The target for each lumbar medial branch is the junction of the ipsilateral superior articular and transverse process of the lower vertebral body. (i.e.:  The L4-L5 facet joint is innervated by the L4 medial branch [located at L5] and the L3 medial branch [located at L4]. Blocking the L4 Medial Branch is therefore achieved by injecting at the junction of the ipsilateral superior articular and transverse process of the lower vertebral body [L5].).  Exception: The exception to the above rule is the L5-S1 facet joint which has triple innervation requiring the L4 medial branch, as well as the L5 and the S1 Dorsal Rami(s) to be blocked to fully denervate the joint.  Under fluoroscopic guidance, a needle was inserted until contact was made with os over the target area. After negative aspiration, 2mL of the nerve block solution was injected without difficulty or complication. Paresthesia were avoided during injection. The needle(s) were removed intact and without complication.  Once the entire procedure was completed, the treated area was cleaned, making sure to leave some of the prepping solution back to take advantage of its long term bactericidal properties.         Illustration of the posterior view of the lumbar spine and the posterior neural structures. Laminae of L2 through S1 are labeled. DPRL5, dorsal primary ramus of L5; DPRS1, dorsal primary ramus of S1; DPR3, dorsal primary ramus of L3; FJ, facet (zygapophyseal) joint L3-L4; I, inferior articular process of L4; LB1, lateral branch of dorsal primary ramus of L1; IAB, inferior articular branches from L3 medial branch (supplies L4-L5 facet joint); IBP, intermediate branch plexus; MB3, medial branch of dorsal primary ramus of L3; NR3, third lumbar nerve root; S, superior articular process of L5; SAB, superior articular branches from L4 (supplies L4-5 facet joint also); TP3, transverse process of L3.   Facet Joint Innervation (* possible contribution)  L1-2 T12, L1 (L2*)  Medial Branch  L2-3 L1, L2 (L3*)                     L3-4 L2, L3 (L4*)                     L4-5 L3, L4 (L5*)  L5-S1 L4, L5, S1                        Vitals:   08/10/24 1333 08/10/24 1404 08/10/24 1409 08/10/24 1411  BP: 131/70 125/73 117/75 115/73  Pulse: 83     Resp: 18 18 17 18   Temp: (!) 97.5 F (36.4 C)     TempSrc: Temporal     SpO2: 99% 95% 97% 98%  Weight: 250 lb (113.4 kg)     Height: 5' 4 (1.626 m)        End Time: 1411 hrs.  Imaging Guidance (Spinal):         Type of Imaging Technique: Fluoroscopy Guidance (Spinal) Indication(s): Fluoroscopy guidance for needle placement to enhance accuracy in procedures requiring precise needle localization for targeted delivery of medication in or near specific anatomical locations not easily accessible without such real-time imaging assistance. Exposure Time: Please see nurses notes. Contrast: None used. Fluoroscopic Guidance: I was personally present during the use of fluoroscopy. Tunnel Vision Technique used to obtain the best possible view of the target area. Parallax error corrected before commencing the procedure. Direction-depth-direction technique used to introduce the needle under continuous pulsed fluoroscopy. Once target was reached, antero-posterior, oblique, and lateral fluoroscopic projection used confirm needle placement in all planes. Images permanently stored in EMR. Interpretation: No contrast injected. I personally interpreted the imaging intraoperatively. Adequate needle placement confirmed in multiple planes. Permanent images saved into the patient's record.  Post-operative Assessment:  Post-procedure Vital Signs:  Pulse/HCG Rate: 8378 Temp: (!) 97.5 F (36.4 C) Resp: 18 BP: 115/73 SpO2: 98 %  EBL: None  Complications: No immediate post-treatment complications observed by team, or reported by patient.  Note: The patient tolerated the entire procedure well. A repeat set of vitals were taken after the procedure and the patient was kept under observation following institutional policy, for this type of  procedure. Post-procedural neurological assessment was performed, showing return to baseline, prior to discharge. The patient was provided with post-procedure discharge instructions, including a section on how to identify potential problems. Should any problems arise concerning this procedure, the patient was given instructions to immediately contact us , at any time, without hesitation. In any case, we plan to contact the patient by telephone for a follow-up status report regarding this interventional procedure.  Comments:  No additional relevant information.  Plan of Care (POC)  Orders:  Orders Placed This Encounter  Procedures   DG PAIN [facility] C-ARM 1-60 MIN NO REPORT    Intraoperative interpretation by procedural physician at [facility].    Standing Status:   Standing    Number of Occurrences:   1    Reason for exam::   Assistance in needle guidance and placement for procedures requiring needle placement in or near specific anatomical locations not easily accessible without such assistance.     Medications ordered for procedure: Meds ordered this encounter  Medications   lidocaine  (XYLOCAINE ) 2 % (with pres) injection 400 mg   ropivacaine  (PF) 2 mg/mL (0.2%) (NAROPIN ) injection 18 mL   Medications administered: We administered lidocaine  and ropivacaine  (PF) 2 mg/mL (0.2%).  See the medical record for exact dosing, route, and time of administration.    Bilateral L3, L4, L5 medial branch nerve block 08/10/2024    Follow-up plan:   Return in about 3 weeks (around 08/31/2024) for PPE, F2F.     Recent Visits Date Type Provider Dept  07/26/24 Office Visit Marcelino Nurse, MD [facility]  Showing recent visits within past 90 days and meeting all other requirements Today's Visits Date Type Provider Dept  08/10/24 Procedure visit Marcelino Nurse, MD [facility]  Showing today's visits and meeting all other requirements Future Appointments Date Type  Provider Dept  09/08/24 Appointment Marcelino Nurse, MD [facility]  Showing future appointments within next 90 days and meeting all other requirements   Disposition: Discharge home  Discharge (Date  Time): 08/10/2024; 1420 hrs.   Primary Care Physician: Sowles, Krichna, MD Location: [facility] Note by: Nurse Marcelino, MD (TTS technology used. I apologize for any typographical errors that were not detected and corrected.) Date: 08/10/2024; Time: 3:33 PM  Disclaimer:  Medicine is not an Visual merchandiser. The only guarantee in medicine is that nothing is guaranteed. It is important to note that the decision to proceed with this intervention was based on the information collected from the patient. The Data and conclusions were drawn from the patient's questionnaire, the interview, and the physical examination. Because the information was provided in large part by the patient, it cannot be guaranteed that it has not been purposely or unconsciously manipulated. Every effort has been made to obtain as much relevant data as possible for this evaluation. It is important to note that the conclusions that lead to this procedure are derived in large part from the available data. Always take into account that the treatment will also be dependent on availability of resources and existing treatment guidelines, considered by other Pain Management Practitioners as being common knowledge and practice, at the time of the intervention. For Medico-Legal purposes, it is also important to point out that variation in procedural techniques and pharmacological choices are the acceptable norm. The indications, contraindications, technique, and results of the above procedure should only be interpreted and judged by a Board-Certified Interventional Pain Specialist with extensive familiarity and expertise in the same exact procedure and technique.

## 2024-08-10 NOTE — Patient Instructions (Signed)
 ____________________________________________________________________________________________  Post-Procedure Discharge Instructions  Instructions: Apply ice:  Purpose: This will minimize any swelling and discomfort after procedure.  When: Day of procedure, as soon as you get home. How: Fill a plastic sandwich bag with crushed ice. Cover it with a small towel and apply to injection site. How long: (15 min on, 15 min off) Apply for 15 minutes then remove x 15 minutes.  Repeat sequence on day of procedure, until you go to bed. Apply heat:  Purpose: To treat any soreness and discomfort from the procedure. When: Starting the next day after the procedure. How: Apply heat to procedure site starting the day following the procedure. How long: May continue to repeat daily, until discomfort goes away. Food intake: Start with clear liquids (like water) and advance to regular food, as tolerated.  Physical activities: Keep activities to a minimum for the first 8 hours after the procedure. After that, then as tolerated. Driving: If you have received any sedation, be responsible and do not drive. You are not allowed to drive for 24 hours after having sedation. Blood thinner: (Applies only to those taking blood thinners) You may restart your blood thinner 6 hours after your procedure. Insulin: (Applies only to Diabetic patients taking insulin) As soon as you can eat, you may resume your normal dosing schedule. Infection prevention: Keep procedure site clean and dry. Shower daily and clean area with soap and water. Post-procedure Pain Diary: Extremely important that this be done correctly and accurately. Recorded information will be used to determine the next step in treatment. For the purpose of accuracy, follow these rules: Evaluate only the area treated. Do not report or include pain from an untreated area. For the purpose of this evaluation, ignore all other areas of pain, except for the treated area. After  your procedure, avoid taking a long nap and attempting to complete the pain diary after you wake up. Instead, set your alarm clock to go off every hour, on the hour, for the initial 8 hours after the procedure. Document the duration of the numbing medicine, and the relief you are getting from it. Do not go to sleep and attempt to complete it later. It will not be accurate. If you received sedation, it is likely that you were given a medication that may cause amnesia. Because of this, completing the diary at a later time may cause the information to be inaccurate. This information is needed to plan your care. Follow-up appointment: Keep your post-procedure follow-up evaluation appointment after the procedure (usually 2 weeks for most procedures, 6 weeks for radiofrequencies). DO NOT FORGET to bring you pain diary with you.   Expect: (What should I expect to see with my procedure?) From numbing medicine (AKA: Local Anesthetics): Numbness or decrease in pain. You may also experience some weakness, which if present, could last for the duration of the local anesthetic. Onset: Full effect within 15 minutes of injected. Duration: It will depend on the type of local anesthetic used. On the average, 1 to 8 hours.  From steroids (Applies only if steroids were used): Decrease in swelling or inflammation. Once inflammation is improved, relief of the pain will follow. Onset of benefits: Depends on the amount of swelling present. The more swelling, the longer it will take for the benefits to be seen. In some cases, up to 10 days. Duration: Steroids will stay in the system x 2 weeks. Duration of benefits will depend on multiple posibilities including persistent irritating factors. Side-effects: If present, they  may typically last 2 weeks (the duration of the steroids). Frequent: Cramps (if they occur, drink Gatorade and take over-the-counter Magnesium 450-500 mg once to twice a day); water retention with temporary  weight gain; increases in blood sugar; decreased immune system response; increased appetite. Occasional: Facial flushing (red, warm cheeks); mood swings; menstrual changes. Uncommon: Long-term decrease or suppression of natural hormones; bone thinning. (These are more common with higher doses or more frequent use. This is why we prefer that our patients avoid having any injection therapies in other practices.)  Very Rare: Severe mood changes; psychosis; aseptic necrosis. From procedure: Some discomfort is to be expected once the numbing medicine wears off. This should be minimal if ice and heat are applied as instructed.  Call if: (When should I call?) You experience numbness and weakness that gets worse with time, as opposed to wearing off. New onset bowel or bladder incontinence. (Applies only to procedures done in the spine)  Emergency Numbers: Durning business hours (Monday - Thursday, 8:00 AM - 4:00 PM) (Friday, 9:00 AM - 12:00 Noon): [phone] After hours: [phone] NOTE: If you are having a problem and are unable connect with, or to talk to a provider, then go to your nearest urgent care or emergency department. If the problem is serious and urgent, please call 911. ____________________________________________________________________________________________  Facet Blocks Patient Information  Description: The facets are joints in the spine between the vertebrae.  Like any joints in the body, facets can become irritated and painful.  Arthritis can also effect the facets.  By injecting steroids and local anesthetic in and around these joints, we can temporarily block the nerve supply to them.  Steroids act directly on irritated nerves and tissues to reduce selling and inflammation which often leads to decreased pain.  Facet blocks may be done anywhere along the spine from the neck to the low back depending upon the location of your pain.   After numbing the skin with local anesthetic  (like Novocaine), a small needle is passed onto the facet joints under x-ray guidance.  You may experience a sensation of pressure while this is being done.  The entire block usually lasts about 15-25 minutes.   Conditions which may be treated by facet blocks:  Low back/buttock pain Neck/shoulder pain Certain types of headaches  Preparation for the injection:  Do not eat any solid food or dairy products within 8 hours of your appointment. You may drink clear liquid up to 3 hours before appointment.  Clear liquids include water, black coffee, juice or soda.  No milk or cream please. You may take your regular medication, including pain medications, with a sip of water before your appointment.  Diabetics should hold regular insulin (if taken separately) and take 1/2 normal NPH dose the morning of the procedure.  Carry some sugar containing items with you to your appointment. A driver must accompany you and be prepared to drive you home after your procedure. Bring all your current medications with you. An IV may be inserted and sedation may be given at the discretion of the physician. A blood pressure cuff, EKG and other monitors will often be applied during the procedure.  Some patients may need to have extra oxygen administered for a short period. You will be asked to provide medical information, including your allergies and medications, prior to the procedure.  We must know immediately if you are taking blood thinners (like Coumadin/Warfarin) or if you are allergic to IV iodine contrast (dye).  We must know if you could possible be pregnant.  Possible side-effects:  Bleeding from needle site Infection (rare, may require surgery) Nerve injury (rare) Numbness & tingling (temporary) Difficulty urinating (rare, temporary) Spinal headache (a headache worse with upright posture) Light-headedness (temporary) Pain at injection site (serveral days) Decreased blood pressure (rare,  temporary) Weakness in arm/leg (temporary) Pressure sensation in back/neck (temporary)   Call if you experience:  Fever/chills associated with headache or increased back/neck pain Headache worsened by an upright position New onset, weakness or numbness of an extremity below the injection site Hives or difficulty breathing (go to the emergency room) Inflammation or drainage at the injection site(s) Severe back/neck pain greater than usual New symptoms which are concerning to you  Please note:  Although the local anesthetic injected can often make your back or neck feel good for several hours after the injection, the pain will likely return. It takes 3-7 days for steroids to work.  You may not notice any pain relief for at least one week.  If effective, we will often do a series of 2-3 injections spaced 3-6 weeks apart to maximally decrease your pain.  After the initial series, you may be a candidate for a more permanent nerve block of the facets.  If you have any questions, please call [phone] [facility]

## 2024-08-10 NOTE — Telephone Encounter (Signed)
 Patient's medications were being sent to [facility] pharmacy.

## 2024-08-10 NOTE — Telephone Encounter (Signed)
 pt states that for her insurance she has to use [employer]

## 2024-08-10 NOTE — Telephone Encounter (Signed)
 I have sent Viibryd  and hydroxyzine  to pharmacy as requested.

## 2024-08-10 NOTE — Telephone Encounter (Signed)
 Second request

## 2024-08-10 NOTE — Telephone Encounter (Signed)
 received fax requesting a refill on the vilazodone  and the hydroxyzine . pt was last seen on 7-29 next appt 8-29

## 2024-08-11 ENCOUNTER — Telehealth: Payer: Self-pay | Admitting: Family Medicine

## 2024-08-11 ENCOUNTER — Telehealth: Payer: Self-pay | Admitting: *Deleted

## 2024-08-11 NOTE — Telephone Encounter (Signed)
metFORMIN (GLUCOPHAGE-XR) 750 MG 24 hr tablet

## 2024-08-11 NOTE — Telephone Encounter (Signed)
 No problems post procedure.

## 2024-08-12 NOTE — Telephone Encounter (Signed)
 Pt had already been notified about rx.

## 2024-08-18 ENCOUNTER — Encounter: Payer: Self-pay | Admitting: Family Medicine

## 2024-08-18 ENCOUNTER — Encounter: Payer: Self-pay | Admitting: Student in an Organized Health Care Education/Training Program

## 2024-08-19 ENCOUNTER — Telehealth: Admitting: Psychiatry

## 2024-08-19 ENCOUNTER — Encounter: Payer: Self-pay | Admitting: Psychiatry

## 2024-08-19 DIAGNOSIS — F331 Major depressive disorder, recurrent, moderate: Secondary | ICD-10-CM

## 2024-08-19 DIAGNOSIS — F411 Generalized anxiety disorder: Secondary | ICD-10-CM

## 2024-08-19 DIAGNOSIS — Z636 Dependent relative needing care at home: Secondary | ICD-10-CM

## 2024-08-19 MED ORDER — BACLOFEN 10 MG PO TABS: 20.0000 mg | ORAL_TABLET | Freq: Every day | ORAL | Status: AC

## 2024-08-19 NOTE — Progress Notes (Signed)
 Virtual Visit via Video Note  I connected with Lindsay Mcdonald on 08/19/24 at  8:30 AM EDT by a video enabled telemedicine application and verified that I am speaking with the correct person using two identifiers.  Location Provider Location : [facility] Patient Location : Car  Participants: Patient , Provider   I discussed the limitations of evaluation and management by telemedicine and the availability of in person appointments. The patient expressed understanding and agreed to proceed.   I discussed the assessment and treatment plan with the patient. The patient was provided an opportunity to ask questions and all were answered. The patient agreed with the plan and demonstrated an understanding of the instructions.   The patient was advised to call back or seek an in-person evaluation if the symptoms worsen or if the condition fails to improve as anticipated.   BH MD OP Progress Note  08/19/2024 1:37 PM Lindsay Mcdonald  MRN:  [id number]  Chief Complaint:  Chief Complaint  Patient presents with   Follow-up   Anxiety   Medication Problem   Depression   Medication Refill   Discussed the use of AI scribe software for clinical note transcription with the patient, who gave verbal consent to proceed.  History of Present Illness Lindsay Mcdonald is a 63 year old African-American female currently employed, divorced, lives in [place], has a history of depression, anxiety, chronic pain, caregiver burden, diabetes mellitus, fibromyalgia, morbid obesity, hypertension, obstructive sleep apnea, hyperlipidemia was evaluated by telemedicine today.  She continues to experience ongoing depression and anxiety, which she reports relate to the stress of managing her mother's care, including recent falls . She describes feeling down nearly every day over the past 2 weeks, with a significant lack of interest or pleasure in activities, especially when visiting the nursing home. Witnessing her mother's  decline and facing the challenges of arranging care negatively impact her mood. She experiences fatigue, low energy, and frequent difficulty concentrating at work due to persistent worry about her mother.   She continues to have challenging sleep, as she states she does not sleep enough, however estimates getting 6 to 7 hours of sleep per night. She continues to take melatonin and baclofen  for sleep and muscle spasms, noting that she recently increased baclofen  to 20 mg nightly per her neurologist's recommendation, which causes some morning drowsiness.   She denies suicidal ideation, self-harm, or thoughts of harming others.   Her current medications include Viibryd  20 mg daily and hydroxyzine  25 - 50 mg at bedtime as needed for sleep but takes it infrequently. She expresses concern about starting Lamictal  due to potential cardiac side effects and has not initiated it, pending further cardiac evaluation.  Currently employed as a Occupational psychologist.   She is currently engaged in psychotherapy sessions with Ms. Veva and is motivated to stay in therapy.  She is not interested in further medication changes at this time and would like to limit the amount of medication she is takes on a daily basis.   Visit Diagnosis:    ICD-10-CM   1. Major depressive disorder, recurrent episode, moderate (HCC)  F33.1     2. Generalized anxiety disorder  F41.1     3. Caregiver burden  Z63.6       Past Psychiatric History: I have reviewed past psychiatric history from progress note on 02/08/2024.  Past trials of medications include sertraline , duloxetine , Xanax , Lexapro , Prozac , nortriptyline.  Past Medical History:  Past Medical History:  Diagnosis Date   Anxiety  Arthritis 03/2020   osteoarthritis both knees   Chronic pain of right knee    Chronic sinusitis    DDD (degenerative disc disease), cervical    cervical radiculopathy   Depression    Diabetes mellitus    insulin  dependant    Fibromyalgia    GERD (gastroesophageal reflux disease)    High cholesterol    Hyperlipidemia    Hypertension    Insomnia    Muscle pain    Nasal polyp    Neuromuscular disorder (HCC)    diabetic neuropathy   Obesity    Palpitations    Tendonitis of knee, left 04/2020   received steroid injection   Vitamin D  deficiency    Vitamin D  deficiency     Past Surgical History:  Procedure Laterality Date   BREAST REDUCTION SURGERY     COLONOSCOPY WITH PROPOFOL  N/A 02/03/2023   Procedure: COLONOSCOPY WITH PROPOFOL ;  Surgeon: Therisa Bi, MD;  Location: [facility];  Service: Gastroenterology;  Laterality: N/A;   ETHMOIDECTOMY Right 05/16/2020   Procedure: ETHMOIDECTOMY;  Surgeon: Edda Mt, MD;  Location: [facility] ORS;  Service: ENT;  Laterality: Right;   EXCISION ORAL TUMOR N/A 05/16/2020   Procedure: EXCISION OF SOFT PALATE PAPILLOMA;  Surgeon: Edda Mt, MD;  Location: [facility] OR[facility];  Service: ENT;  Laterality: N/A;   FRONTAL SINUS EXPLORATION Right 05/16/2020   Procedure: FRONTAL SINUS EXPLORATION;  Surgeon: Edda Mt, MD;  Location: [facility] OR[facility];  Service: ENT;  Laterality: Right;   IMAGE GUIDED SINUS SURGERY N/A 05/16/2020   Procedure: IMAGE GUIDED SINUS SURGERY;  Surgeon: Edda Mt, MD;  Location: [facility];  Service: ENT;  Laterality: N/A;   MAXILLARY ANTROSTOMY Bilateral 05/16/2020   Procedure: MAXILLARY ANTROSTOMY with tissue removal;  Surgeon: Edda Mt, MD;  Location: [facility] OR[facility];  Service: ENT;  Laterality: Bilateral;   nasal endoscopic     OOPHORECTOMY  ?   one ovary removed    REDUCTION MAMMAPLASTY Bilateral 2000   removal of ovary     SPHENOIDECTOMY Right 05/16/2020   Procedure: SPHENOIDECTOMY;  Surgeon: Edda Mt, MD;  Location: [facility] ORS;  Service: ENT;  Laterality: Right;    Family Psychiatric History: I have reviewed family psychiatric history from progress note on 02/08/2024.  Family History:  Family History  Problem Relation Age of Onset   Dementia Mother     Diabetes Mother    Cancer Mother 38       Breast   Breast cancer Mother 89   Gout Mother    Diabetes Father    Breast cancer Maternal Aunt 52   Kidney cancer Maternal Aunt    Kidney disease Maternal Aunt    Stroke Maternal Uncle    Alcohol abuse Daughter    Diabetes Daughter        Oldest Daughter   Alcohol abuse Daughter    Cancer Half-Sister 39       breast cancer   Prostate cancer Neg Hx    Bladder Cancer Neg Hx     Social History: I have reviewed social history from progress note on 02/08/2024. Social History   Socioeconomic History   Marital status: Divorced    Spouse name: Not on file   Number of children: 2   Years of education: Not on file   Highest education level: Some college, no degree  Occupational History   Occupation: Engineer, mining   Tobacco Use   Smoking status: Never   Smokeless tobacco: Never  Vaping Use   Vaping status: Former  Substance and Sexual Activity   Alcohol use: No    Alcohol/week: 0.0 standard drinks of alcohol   Drug use: No   Sexual activity: Not Currently    Partners: Male    Birth control/protection: None  Other Topics Concern   Not on file  Social History Narrative   Divorced and he died since the divorce   Working at Patent attorney.    Social Drivers of Health   Financial Resource Strain: Medium Risk (07/20/2024)   Overall Financial Resource Strain (CARDIA)    Difficulty of Paying Living Expenses: Somewhat hard  Food Insecurity: Food Insecurity Present (07/20/2024)   Hunger Vital Sign    Worried About Running Out of Food in the Last Year: Sometimes true    Ran Out of Food in the Last Year: Sometimes true  Transportation Needs: No Transportation Needs (07/20/2024)   PRAPARE - Administrator, Civil Service (Medical): No    Lack of Transportation (Non-Medical): No  Physical Activity: Inactive (07/20/2024)   Exercise Vital Sign    Days of Exercise per Week: 0 days    Minutes of Exercise per Session: Not on file   Stress: Stress Concern Present (07/20/2024)   [employer] of Occupational Health - Occupational Stress Questionnaire    Feeling of Stress: Very much  Social Connections: Unknown (07/20/2024)   Social Connection and Isolation Panel    Frequency of Communication with Friends and Family: Three times a week    Frequency of Social Gatherings with Friends and Family: Patient declined    Attends Religious Services: Patient declined    Database administrator or Organizations: Patient declined    Attends Banker Meetings: Not on file    Marital Status: Divorced    Allergies:  Allergies  Allergen Reactions   Simvastatin     Muscle cramps    Trazodone  Hcl     It may have caused sob    Zetia  [Ezetimibe ] Other (See Comments)    Myalgia    Atorvastatin Other (See Comments)    muscle cramps   Compazine  Other (See Comments)    Twitching and spasms of neck muscles   Penicillins Rash    Reaction under 25, patient unsure that she still has an allergy    Metabolic Disorder Labs: Lab Results  Component Value Date   HGBA1C 7.3 (A) 07/21/2024   MPG 166 02/19/2022   MPG 146 04/09/2020   No results found for: PROLACTIN Lab Results  Component Value Date   CHOL 183 05/27/2024   TRIG 83 05/27/2024   HDL 59 05/27/2024   CHOLHDL 3.1 05/27/2024   VLDL 29 06/27/2016   LDLCALC 107 (H) 05/27/2024   LDLCALC 134 (H) 05/15/2023   Lab Results  Component Value Date   TSH 3.27 10/08/2023   TSH 3.00 02/22/2021    Therapeutic Level Labs: No results found for: LITHIUM No results found for: VALPROATE No results found for: CBMZ  Current Medications: Current Outpatient Medications  Medication Sig Dispense Refill   ACCU-CHEK GUIDE TEST test strip USE THREE TIMES DAILY TO TEST BLOOD SUGAR. MORNING, NOON AND BEDTIME. 100 strip 0   Accu-Chek Softclix Lancets lancets USE TO TEST BLOOD SUGAR THREE TIMES DAILY. MORNING, NOON AND BEDTIME. 100 each 0   Albuterol -Budesonide  (AIRSUPRA ) 90-80 MCG/ACT AERO Inhale 2 puffs into the lungs in the morning, at noon, in the evening, and at bedtime. 10.7 g 0   aspirin EC 81 MG tablet Take 81 mg by mouth  daily. Swallow whole.     baclofen  (LIORESAL ) 10 MG tablet Take 2 tablets (20 mg total) by mouth at bedtime.     estradiol -norethindrone (COMBIPATCH) 0.05-0.14 MG/DAY Place 1 patch onto the skin.     fluticasone  (FLONASE ) 50 MCG/ACT nasal spray Place 2 sprays into both nostrils daily as needed for allergies or rhinitis.     GVOKE HYPOPEN  2-PACK 1 MG/0.2ML SOAJ Inject 1 mg into the skin as needed (for hypoglycemia episodes (glucose below 60 and symptomatic)). 0.4 mL 1   hydrOXYzine  (VISTARIL ) 25 MG capsule Take 1-2 capsules (25-50 mg total) by mouth daily as needed for anxiety. 60 capsule 0   insulin  glargine, 1 Unit Dial, (TOUJEO  SOLOSTAR) 300 UNIT/ML Solostar Pen Inject 40-50 Units into the skin every morning. 4.5 mL 0   Insulin  Pen Needle 32G X 6 MM MISC 1 each by Does not apply route daily at 2 PM. 100 each 1   meclizine (ANTIVERT) 25 MG tablet      Melatonin 10 MG TABS Take 10 mg by mouth at bedtime as needed (sleep).      metFORMIN  (GLUCOPHAGE -XR) 750 MG 24 hr tablet Take 2 tablets (1,500 mg total) by mouth daily with breakfast. 180 tablet 0   montelukast  (SINGULAIR ) 10 MG tablet Take 1 tablet (10 mg total) by mouth at bedtime. (Patient not taking: Reported on 08/10/2024) 90 tablet 1   naproxen  (NAPROSYN ) 500 MG tablet Take 1 tablet (500 mg total) by mouth 2 (two) times daily as needed. 14 tablet 0   olmesartan  (BENICAR ) 40 MG tablet Take 1 tablet (40 mg total) by mouth daily. 90 tablet 1   tirzepatide  (MOUNJARO ) 12.5 MG/0.5ML Pen Inject 12.5 mg into the skin once a week. 6 mL 0   traMADol (ULTRAM) 50 MG tablet Take 50 mg by mouth 2 (two) times daily as needed.     Vilazodone  HCl 20 MG TABS Take 1 tablet (20 mg total) by mouth daily. 90 tablet 0   No current facility-administered medications for this visit.      Musculoskeletal: Strength & Muscle Tone: UTA Gait & Station: Seated Patient leans: N/A  Psychiatric Specialty Exam: Review of Systems  Psychiatric/Behavioral:  Positive for decreased concentration, dysphoric mood and sleep disturbance. The patient is nervous/anxious.     There were no vitals taken for this visit.There is no height or weight on file to calculate BMI.  General Appearance: Casual  Eye Contact:  Fair  Speech:  Clear and Coherent  Volume:  Normal  Mood:  Anxious and Depressed  Affect:  Appropriate  Thought Process:  Goal Directed and Descriptions of Associations: Intact  Orientation:  Full (Time, Place, and Person)  Thought Content: Logical   Suicidal Thoughts:  No  Homicidal Thoughts:  No  Memory:  Immediate;   Fair Recent;   Fair Remote;   Fair  Judgement:  Fair  Insight:  Fair  Psychomotor Activity:  Normal  Concentration:  Concentration: Fair and Attention Span: Fair  Recall:  [employer] of Knowledge: Fair  Language: Fair  Akathisia:  No  Handed:  Right  AIMS (if indicated): not done  Assets:  Communication Skills Desire for Improvement Housing Social Support  ADL's:  Intact  Cognition: WNL  Sleep:  improving   Screenings: GAD-7    Loss adjuster, chartered Office Visit from 07/21/2024 in [facility] Office Visit from 07/19/2024 in [facility] Office Visit from 05/27/2024 in [facility] Office Visit  from 04/25/2024 in [facility] Office Visit from 04/13/2024 in [facility]  Total GAD-7 Score 21 7 21 20 20    PHQ2-9    Flowsheet Row Video Visit from 08/19/2024 in [facility] Procedure visit from 08/10/2024 in [facility] Visit from 07/26/2024 in [facility] Visit from 07/21/2024 in [facility] Office Visit from 07/19/2024 in [facility]  PHQ-2 Total Score 6 0 2 6 2   PHQ-9 Total Score 15 -- -- 17 8   Flowsheet Row Video Visit from 08/19/2024 in [facility] Office Visit from 07/19/2024 in [facility] Video Visit from 06/21/2024 in [facility]  C-SSRS RISK CATEGORY No Risk No Risk No Risk     Assessment and Plan: Lindsay Mcdonald is a 63 year old African-American female who has a history of depression, anxiety was evaluated by telemedicine today.  Discussed assessment and plan as noted below.  Major depression-improving Generalized anxiety disorder-unstable Caregiver burnout-unstable Currently struggling with anxiety mood symptoms mostly related to her mother's care, currently trying to move her mother to a new assisted living facilities due to recent challenges at the current facility which does have an impact on her mood, sleep and focus at work.  She however is not interested in medication management.  She did not take the Lamictal  which was recently prescribed since she is worried about cardiac health and side effects of medications. Continue Viibryd  20 mg daily Continue Hydroxyzine  25 to 50 mg at bedtime as needed.  We will consider reducing the dosage to 10 mg in the future as needed Discontinue Lamictal  for noncompliance. Encouraged to have more frequent psychotherapy sessions with Ms. Veva.  Follow-up Follow-up in clinic in 3 months or sooner if needed.  If patient decides to have further medication changes patient advised to call back for a sooner visit as needed.     Collaboration of Care: Collaboration of Care: Referral or follow-up with counselor/therapist AEB encouraged to continue psychotherapy sessions, will coordinate care with Ms.  Veva.  Patient/Guardian was advised Release of Information must be obtained prior to any record release in order to collaborate their care with an outside provider. Patient/Guardian was advised if they have not already done so to contact the registration department to sign all necessary forms in order for us  to release information regarding their care.   Consent: Patient/Guardian gives verbal consent for treatment and assignment of benefits for services provided during this visit. Patient/Guardian expressed understanding and agreed to proceed.  This note was generated in part or whole with voice recognition software. Voice recognition is usually quite accurate but there are transcription errors that can and very often do occur. I apologize for any typographical errors that were not detected and corrected.     Lindsay Hinzman, MD 08/19/2024, 1:37 PM

## 2024-08-25 ENCOUNTER — Encounter (INDEPENDENT_AMBULATORY_CARE_PROVIDER_SITE_OTHER): Payer: Self-pay

## 2024-08-25 DIAGNOSIS — F331 Major depressive disorder, recurrent, moderate: Secondary | ICD-10-CM

## 2024-08-27 ENCOUNTER — Encounter: Payer: Self-pay | Admitting: Family Medicine

## 2024-08-29 MED ORDER — MIRTAZAPINE 7.5 MG PO TABS: 7.5000 mg | ORAL_TABLET | Freq: Every evening | ORAL | 1 refills | Status: DC | PRN

## 2024-08-29 NOTE — Telephone Encounter (Signed)
 Patient with concerns about sleep.  Previously tried medications like trazodone , nortriptyline.  Agreeable to trial of mirtazapine .  Will start low-dose mirtazapine  at 7.5 mg daily.  Provided medication education.  Patient to get immediate help if any side effects or allergic reactions.  I have sent that medication as discussed to [facility].  I have also sent communication to patient through MyChart.   I have spent at least 5 minutes non face to face with patient today.

## 2024-09-01 ENCOUNTER — Other Ambulatory Visit

## 2024-09-06 ENCOUNTER — Ambulatory Visit (INDEPENDENT_AMBULATORY_CARE_PROVIDER_SITE_OTHER): Admitting: Professional Counselor

## 2024-09-06 DIAGNOSIS — Z636 Dependent relative needing care at home: Secondary | ICD-10-CM | POA: Diagnosis not present

## 2024-09-06 DIAGNOSIS — F411 Generalized anxiety disorder: Secondary | ICD-10-CM | POA: Diagnosis not present

## 2024-09-06 DIAGNOSIS — F331 Major depressive disorder, recurrent, moderate: Secondary | ICD-10-CM

## 2024-09-06 NOTE — Progress Notes (Signed)
 THERAPIST PROGRESS NOTE  Virtual Visit via Video Note  I connected with Lindsay Mcdonald on 09/06/24 at  8:00 AM EDT by a video enabled telemedicine application and verified that I am speaking with the correct person using two identifiers.  Location: Patient: Community (parked car) Provider: Remote office   I discussed the limitations of evaluation and management by telemedicine and the availability of in person appointments. The patient expressed understanding and agreed to proceed.   I discussed the assessment and treatment plan with the patient. The patient was provided an opportunity to ask questions and all were answered. The patient agreed with the plan and demonstrated an understanding of the instructions.   The patient was advised to call back or seek an in-person evaluation if the symptoms worsen or if the condition fails to improve as anticipated.  I provided 50 minutes of non-face-to-face time during this encounter. Lindsay Mcdonald, [facility]  Session Time: 8:01 AM - 8:51 AM   Participation Level: Active  Behavioral Response: Well Groomed, Alert, Anxious and Dysphoric  Type of Therapy: Individual Therapy  Treatment Goals addressed: Active OP Depression  LTG: I'd like to be able to make decisions and have clarity and feel good about my decisions. I'd like to also feel better with relationships. How to manage stress.  (Progressing)    Start:  05/25/24    Expected End:  05/24/25    Goal Note Reviewed 09/06/24 Still need, not where I want to be, just trying to put it all together.  STG: Stress management. To improve stress management AEB reduction in mood symptoms by practicing coping skills over the next 12 weeks (Progressing)  Goal Note Reviewed 09/06/24 I think a little better. I don't know if it's due to the medication and overeating, but that's not coping, but still working on it. Some things you mentioned today I can try to implement.  STG: Self-care,  self-love, I'm kind of hard on myself. To improve sense of self AEB self-report in increase of self-care activities and restructuring maladaptive patterns of thinking over the next 90 days.  (Progressing)  Goal Note Reviewed 09/06/24 Like we discussed today, the acceptance part is the key, if I can get there.  STG: I just want to be able to control that more. (Bringing up graphic images like traumatic things seen on TV). To reduce distress brought on by OCD tendencies AEB self-report reduction in obsessive and compulsive behaviors with exposure and CBT.  (Progressing)  Goal Note Reviewed 09/06/24 A little bit better. I think when you're busy that helps distract some, but when you're by yourself, that still needs assistance.  ProgressTowards Goals: Progressing  Interventions: CBT, Motivational Interviewing, and Supportive  Summary: Lindsay Mcdonald is a 63 y.o. female who presents with a history of anxiety, depression, and caregiver burden. She appeared somber but oriented x5. She stated she is still working on getting her mother into another nursing home. Her mother was approved for Medicaid but now they have an out-of-pocket expense that she isn't prepared to pay. Lindsay Mcdonald has struggled to practice coping skills but noted ways she is trying. She also identified possible ways to improve skill practice. She was receptive to urge surfing to reduce emotional eating. She was also receptive to body positivity exercises and affirmations to help build self-love/confidence. She noted progress on goals and areas for continued improvement.   Therapist Response: Conducted session with Lindsay. Began session with check-in/update since previous session. Utilized empathetic and reflective listening. Used open-ended questions to  facilitate discussion and summarized [name]'s thoughts/feelings. Normalized caregiver burden and trouble adjusting to parental changes. Explained coping skills for behavior change. Modeled how to  practice body positivity and affirmations. Reviewed treatment plan with input from [employer] on current strengths, needs, and progress towards goals. Scheduled additional appointment and concluded session.   Suicidal/Homicidal: No  Plan: Return again in 3 weeks.  Diagnosis: Major depressive disorder, recurrent episode, moderate (HCC)  Generalized anxiety disorder  Caregiver burden  Collaboration of Care: Medication Management AEB chart review  Patient/Guardian was advised Release of Information must be obtained prior to any record release in order to collaborate their care with an outside provider. Patient/Guardian was advised if they have not already done so to contact the registration department to sign all necessary forms in order for us  to release information regarding their care.   Consent: Patient/Guardian gives verbal consent for treatment and assignment of benefits for services provided during this visit. Patient/Guardian expressed understanding and agreed to proceed.   Lindsay Mcdonald, [facility] 09/06/2024

## 2024-09-08 ENCOUNTER — Ambulatory Visit: Admitting: Student in an Organized Health Care Education/Training Program

## 2024-09-13 ENCOUNTER — Encounter: Payer: Self-pay | Admitting: Nurse Practitioner

## 2024-09-15 ENCOUNTER — Encounter: Payer: Self-pay | Admitting: Family Medicine

## 2024-09-19 ENCOUNTER — Ambulatory Visit: Admitting: Nurse Practitioner

## 2024-09-19 ENCOUNTER — Encounter: Payer: Self-pay | Admitting: Nurse Practitioner

## 2024-09-20 ENCOUNTER — Other Ambulatory Visit: Payer: Self-pay | Admitting: Psychiatry

## 2024-09-20 DIAGNOSIS — F331 Major depressive disorder, recurrent, moderate: Secondary | ICD-10-CM

## 2024-09-20 DIAGNOSIS — F411 Generalized anxiety disorder: Secondary | ICD-10-CM

## 2024-09-24 ENCOUNTER — Other Ambulatory Visit: Payer: Self-pay | Admitting: Family Medicine

## 2024-09-24 DIAGNOSIS — E1169 Type 2 diabetes mellitus with other specified complication: Secondary | ICD-10-CM

## 2024-09-26 ENCOUNTER — Other Ambulatory Visit: Payer: Self-pay | Admitting: Psychiatry

## 2024-09-26 DIAGNOSIS — F331 Major depressive disorder, recurrent, moderate: Secondary | ICD-10-CM

## 2024-09-28 ENCOUNTER — Ambulatory Visit (INDEPENDENT_AMBULATORY_CARE_PROVIDER_SITE_OTHER): Admitting: Professional Counselor

## 2024-09-28 ENCOUNTER — Telehealth: Payer: Self-pay | Admitting: Psychiatry

## 2024-09-28 DIAGNOSIS — F331 Major depressive disorder, recurrent, moderate: Secondary | ICD-10-CM

## 2024-09-28 DIAGNOSIS — F411 Generalized anxiety disorder: Secondary | ICD-10-CM

## 2024-09-28 NOTE — Telephone Encounter (Signed)
 Please check with patient what medication she is requesting for a refill since it looks like she is not due for a refill on any of her medications at this time.

## 2024-09-28 NOTE — Telephone Encounter (Signed)
 Patient calls asking about medication refill. Her pharmacy is saying it is rejecting when sending request to Dr. Coby. Please call and advise

## 2024-09-28 NOTE — Progress Notes (Unsigned)
 THERAPIST PROGRESS NOTE  Virtual Visit via Video Note  I connected with Lindsay Mcdonald on 09/29/24 at  8:00 AM EDT by a video enabled telemedicine application and verified that I am speaking with the correct person using two identifiers.  Location: Patient: Community (parked car) Provider: Office   I discussed the limitations of evaluation and management by telemedicine and the availability of in person appointments. The patient expressed understanding and agreed to proceed.  I discussed the assessment and treatment plan with the patient. The patient was provided an opportunity to ask questions and all were answered. The patient agreed with the plan and demonstrated an understanding of the instructions.   The patient was advised to call back or seek an in-person evaluation if the symptoms worsen or if the condition fails to improve as anticipated.  I provided 38 minutes of non-face-to-face time during this encounter. Lindsay Mcdonald, Lindsay Mcdonald  Session Time: 8:05 AM - 8:43 AM  Participation Level: Active  Behavioral Response: Well Groomed, Alert, Anxious  Type of Therapy: Individual Therapy  Treatment Goals addressed: Active OP Depression  LTG: I'd like to be able to make decisions and have clarity and feel good about my decisions. I'd like to also feel better with relationships. How to manage stress.  (Progressing)                Start:  05/25/24    Expected End:  05/24/25    Goal Note Reviewed 09/06/24 Still need, not where I want to be, just trying to put it all together.   STG: Stress management. To improve stress management AEB reduction in mood symptoms by practicing coping skills over the next 12 weeks (Progressing)  Goal Note Reviewed 09/06/24 I think a little better. I don't know if it's due to the medication and overeating, but that's not coping, but still working on it. Some things you mentioned today I can try to implement.   STG: Self-care, self-love, I'm  kind of hard on myself. To improve sense of self AEB self-report in increase of self-care activities and restructuring maladaptive patterns of thinking over the next 90 days.  (Progressing)  Goal Note Reviewed 09/06/24 Like we discussed today, the acceptance part is the key, if I can get there.   STG: I just want to be able to control that more. (Bringing up graphic images like traumatic things seen on TV). To reduce distress brought on by OCD tendencies AEB self-report reduction in obsessive and compulsive behaviors with exposure and CBT.  (Progressing)  Goal Note Reviewed 09/06/24 A little bit better. I think when you're busy that helps distract some, but when you're by yourself, that still needs assistance.  ProgressTowards Goals: Progressing  Interventions: CBT, Motivational Interviewing, Assertiveness Training, and Supportive  Summary: Lindsay Mcdonald is a 63 y.o. female who presents with a history of anxiety, depression, and caregiver burden. She reported she is still working on things with her mother. Lindsay Mcdonald expressed concerns about her intellect. She noted ongoing struggles with comprehension and impact it has on work with forgetting procedures and making errors. 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 She was receptive to speaking with doctors about evaluation referrals. She engaged in discussion about other ways to manage anxiety at work, including Teacher, adult education, assertiveness, and confidence building. She noted she can start trying these today.   Therapist Response: Conducted session with Lindsay. Began session with check-in/update since previous session. Utilized empathetic and reflective listening. Used open-ended questions  to facilitate discussion and summarized Lindsay Mcdonald's thoughts/feelings. Referred Lindsay to medical provider about in-depth evaluations. Focused session on ways to reduce anxiety, improve assertiveness and confidence. Scheduled additional  appointment and concluded session.   Suicidal/Homicidal: No  Plan: Return again in 3 weeks.  Diagnosis: Generalized anxiety disorder  Major depressive disorder, recurrent episode, moderate (HCC)  Collaboration of Care: Medication Management AEB chart review  Patient/Guardian was advised Release of Information must be obtained prior to any record release in order to collaborate their care with an outside provider. Patient/Guardian was advised if they have not already done so to contact the registration department to sign all necessary forms in order for us  to release information regarding their care.   Consent: Patient/Guardian gives verbal consent for treatment and assignment of benefits for services provided during this visit. Patient/Guardian expressed understanding and agreed to proceed.   Lindsay Mcdonald, Lindsay Mcdonald 09/29/2024

## 2024-09-29 ENCOUNTER — Ambulatory Visit: Admitting: Student in an Organized Health Care Education/Training Program

## 2024-09-29 NOTE — Telephone Encounter (Signed)
 Called patient to discuss medication refills no answer left voicemail for patient to return call to the office

## 2024-10-06 ENCOUNTER — Other Ambulatory Visit (HOSPITAL_COMMUNITY): Payer: Self-pay

## 2024-10-06 ENCOUNTER — Telehealth: Payer: Self-pay | Admitting: Pharmacy Technician

## 2024-10-06 ENCOUNTER — Encounter: Payer: Self-pay | Admitting: Family Medicine

## 2024-10-06 NOTE — Telephone Encounter (Signed)
 Pharmacy Patient Advocate Encounter   Received notification from Onbase that prior authorization for Toujeo  SoloStar 300UNIT/ML pen-injectors is required/requested.   Insurance verification completed.   The patient is insured through [facility].   Per test claim: Maximum 2 fills at retail then must use mail service for maintenance meds. PA not needed at this time.

## 2024-10-09 ENCOUNTER — Other Ambulatory Visit: Payer: Self-pay | Admitting: Family Medicine

## 2024-10-09 DIAGNOSIS — E1169 Type 2 diabetes mellitus with other specified complication: Secondary | ICD-10-CM

## 2024-10-13 ENCOUNTER — Ambulatory Visit: Admitting: Family Medicine

## 2024-10-13 ENCOUNTER — Encounter: Payer: Self-pay | Admitting: Family Medicine

## 2024-10-13 VITALS — BP 136/84 | HR 98 | Resp 16 | Ht 64.0 in | Wt 246.5 lb

## 2024-10-13 DIAGNOSIS — F339 Major depressive disorder, recurrent, unspecified: Secondary | ICD-10-CM

## 2024-10-13 DIAGNOSIS — Z7985 Long-term (current) use of injectable non-insulin antidiabetic drugs: Secondary | ICD-10-CM

## 2024-10-13 DIAGNOSIS — J302 Other seasonal allergic rhinitis: Secondary | ICD-10-CM

## 2024-10-13 DIAGNOSIS — E1169 Type 2 diabetes mellitus with other specified complication: Secondary | ICD-10-CM | POA: Diagnosis not present

## 2024-10-13 DIAGNOSIS — G720 Drug-induced myopathy: Secondary | ICD-10-CM | POA: Diagnosis not present

## 2024-10-13 DIAGNOSIS — M5126 Other intervertebral disc displacement, lumbar region: Secondary | ICD-10-CM

## 2024-10-13 DIAGNOSIS — T466X5A Adverse effect of antihyperlipidemic and antiarteriosclerotic drugs, initial encounter: Secondary | ICD-10-CM

## 2024-10-13 DIAGNOSIS — E785 Hyperlipidemia, unspecified: Secondary | ICD-10-CM

## 2024-10-13 DIAGNOSIS — G4733 Obstructive sleep apnea (adult) (pediatric): Secondary | ICD-10-CM

## 2024-10-13 DIAGNOSIS — E119 Type 2 diabetes mellitus without complications: Secondary | ICD-10-CM

## 2024-10-13 DIAGNOSIS — Z6841 Body Mass Index (BMI) 40.0 and over, adult: Secondary | ICD-10-CM

## 2024-10-13 DIAGNOSIS — I10 Essential (primary) hypertension: Secondary | ICD-10-CM

## 2024-10-13 DIAGNOSIS — F5105 Insomnia due to other mental disorder: Secondary | ICD-10-CM

## 2024-10-13 LAB — POCT GLYCOSYLATED HEMOGLOBIN (HGB A1C): Hemoglobin A1C: 7.2 % — AB (ref 4.0–5.6)

## 2024-10-13 MED ORDER — TOUJEO SOLOSTAR 300 UNIT/ML ~~LOC~~ SOPN: 40.0000 [IU] | PEN_INJECTOR | SUBCUTANEOUS | 0 refills | Status: DC

## 2024-10-13 MED ORDER — FREESTYLE LIBRE 3 PLUS SENSOR MISC: 1.0000 | 1 refills | Status: DC

## 2024-10-13 MED ORDER — OLMESARTAN MEDOXOMIL-HCTZ 40-12.5 MG PO TABS: 1.0000 | ORAL_TABLET | Freq: Every day | ORAL | 0 refills | Status: DC

## 2024-10-13 MED ORDER — MONTELUKAST SODIUM 10 MG PO TABS: 10.0000 mg | ORAL_TABLET | Freq: Every day | ORAL | 1 refills | Status: DC

## 2024-10-13 MED ORDER — TIRZEPATIDE 15 MG/0.5ML ~~LOC~~ SOAJ: 15.0000 mg | SUBCUTANEOUS | 0 refills | Status: DC

## 2024-10-13 MED ORDER — ICOSAPENT ETHYL 1 G PO CAPS: 2.0000 g | ORAL_CAPSULE | Freq: Two times a day (BID) | ORAL | 1 refills | Status: DC

## 2024-10-13 NOTE — Progress Notes (Signed)
 Name: Saxon Crosby   MRN: [id number]    DOB: 04-12-1961   Date:10/13/2024       Progress Note  Subjective  Chief Complaint  Chief Complaint  Patient presents with   Medical Management of Chronic Issues   Discussed the use of AI scribe software for clinical note transcription with the patient, who gave verbal consent to proceed.  History of Present Illness Alece Koppel is a 63 year old female with diabetes, obesity, and hypertension who presents for a regular follow-up visit.  She has been experiencing generalized aches since her last visit. Her diabetes management is challenging due to dietary habits, including overeating and cravings for junk food. Despite eating frequently, she sometimes feels unsatisfied. Her recent A1c was 7.2, slightly improved from 7.3 in July. She is on Mounjaro  weekly but occasionally misses doses and takes metformin  twice daily, though she struggles to remember the evening dose.She uses Toujeo  insulin  at 30 units and can sense hypoglycemia through symptoms like shaking. She has not been checking her fsbs  Her blood pressure is currently 136/84. She takes olmesartan  for hypertension and has previously used hydrochlorothiazide  but cannot recall the reason for discontinuation. She does not regularly monitor her blood pressure at home.  She is not on any cholesterol medication due to intolerance, having experienced statin myopathy. She has tried Zetia  but never filled fish oil prescription in the past but willing to try it now   She has a history of fibromyalgia and uses baclofen  for muscle relaxation.  She experiences seasonal allergic rhinitis with episodes of cough and shortness of breath. Montelukast  is used for allergies, and she has an inhaler for occasional shortness of breath. She recalls a recent episode of shortness of breath at work after consuming Diet Coke.  Her mother is in a hospice facility, described as 'very weak, very fragile,' contributing to  her emotional stress and depression, which she manages with therapy and medication. Currently seeing Dr. Coby who prescribes her anti-depressants and Remeron  for insomnia     Patient Active Problem List   Diagnosis Date Noted   Lumbar spondylosis 07/26/2024   Chronic radicular lumbar pain 07/26/2024   Rotator cuff arthropathy of both shoulders (left>right) 07/26/2024   Chronic pain syndrome 07/26/2024   Caregiver burden 06/10/2024   Major depressive disorder, recurrent episode, moderate (HCC) 04/13/2024   Current mild episode of major depressive disorder without prior episode 02/08/2024   Anxiety disorder 02/08/2024   Herniated intervertebral disc of lumbar spine 08/07/2023   B12 deficiency 05/15/2023   Occult blood positive stool 02/03/2023   Adenomatous polyp of colon 02/03/2023   Dyslipidemia associated with type 2 diabetes mellitus (HCC) 05/21/2021   Hypertension associated with diabetes (HCC) 08/31/2020   History of marijuana use 03/30/2019   Fibromyalgia 12/31/2018   Venous vascular malformations 03/27/2016   Seasonal allergic rhinitis 09/26/2015   Muscle spasms of neck 06/19/2015   Insomnia 06/19/2015   Primary osteoarthritis of right knee 06/17/2015   Depression, major, recurrent, mild (HCC) 06/17/2015   Hyperlipidemia 06/17/2015   Benign hypertension 06/17/2015   Morbid obesity with BMI of 40.0-44.9, adult (HCC) 06/17/2015   Vitamin D  deficiency 06/17/2015    Past Surgical History:  Procedure Laterality Date   BREAST REDUCTION SURGERY     COLONOSCOPY WITH PROPOFOL  N/A 02/03/2023   Procedure: COLONOSCOPY WITH PROPOFOL ;  Surgeon: Therisa Bi, MD;  Location: [facility];  Service: Gastroenterology;  Laterality: N/A;   ETHMOIDECTOMY Right 05/16/2020   Procedure: ETHMOIDECTOMY;  Surgeon: Edda Mt, MD;  Location: [facility] ORS;  Service: ENT;  Laterality: Right;   EXCISION ORAL TUMOR N/A 05/16/2020   Procedure: EXCISION OF SOFT PALATE PAPILLOMA;  Surgeon: Edda Mt,  MD;  Location: [facility] ORS;  Service: ENT;  Laterality: N/A;   FRONTAL SINUS EXPLORATION Right 05/16/2020   Procedure: FRONTAL SINUS EXPLORATION;  Surgeon: Edda Mt, MD;  Location: [facility] OR[facility];  Service: ENT;  Laterality: Right;   IMAGE GUIDED SINUS SURGERY N/A 05/16/2020   Procedure: IMAGE GUIDED SINUS SURGERY;  Surgeon: Edda Mt, MD;  Location: [facility] OR[facility];  Service: ENT;  Laterality: N/A;   MAXILLARY ANTROSTOMY Bilateral 05/16/2020   Procedure: MAXILLARY ANTROSTOMY with tissue removal;  Surgeon: Edda Mt, MD;  Location: [facility] OR[facility];  Service: ENT;  Laterality: Bilateral;   nasal endoscopic     OOPHORECTOMY  ?   one ovary removed    REDUCTION MAMMAPLASTY Bilateral 2000   removal of ovary     SPHENOIDECTOMY Right 05/16/2020   Procedure: SPHENOIDECTOMY;  Surgeon: Edda Mt, MD;  Location: [facility] OR[facility];  Service: ENT;  Laterality: Right;    Family History  Problem Relation Age of Onset   Dementia Mother    Diabetes Mother    Cancer Mother 64       Breast   Breast cancer Mother 101   Gout Mother    Diabetes Father    Breast cancer Maternal Aunt 100   Kidney cancer Maternal Aunt    Kidney disease Maternal Aunt    Stroke Maternal Uncle    Alcohol abuse Daughter    Diabetes Daughter        Oldest Daughter   Alcohol abuse Daughter    Cancer Half-Sister 56       breast cancer   Prostate cancer Neg Hx    Bladder Cancer Neg Hx     Social History   Tobacco Use   Smoking status: Never   Smokeless tobacco: Never  Substance Use Topics   Alcohol use: No    Alcohol/week: 0.0 standard drinks of alcohol     Current Outpatient Medications:    Accu-Chek Softclix Lancets lancets, USE TO TEST BLOOD SUGAR THREE TIMES DAILY. MORNING, NOON AND BEDTIME., Disp: 100 each, Rfl: 0   Albuterol -Budesonide (AIRSUPRA ) 90-80 MCG/ACT AERO, Inhale 2 puffs into the lungs in the morning, at noon, in the evening, and at bedtime., Disp: 10.7 g, Rfl: 0   aspirin EC 81 MG tablet, Take 81 mg by mouth daily.  Swallow whole., Disp: , Rfl:    baclofen  (LIORESAL ) 10 MG tablet, Take 2 tablets (20 mg total) by mouth at bedtime., Disp: , Rfl:    estradiol -norethindrone (COMBIPATCH) 0.05-0.14 MG/DAY, Place 1 patch onto the skin., Disp: , Rfl:    fluticasone  (FLONASE ) 50 MCG/ACT nasal spray, Place 2 sprays into both nostrils daily as needed for allergies or rhinitis., Disp: , Rfl:    GVOKE HYPOPEN  2-PACK 1 MG/0.2ML SOAJ, Inject 1 mg into the skin as needed (for hypoglycemia episodes (glucose below 60 and symptomatic))., Disp: 0.4 mL, Rfl: 1   hydrOXYzine  (VISTARIL ) 25 MG capsule, Take 1-2 capsules (25-50 mg total) by mouth daily as needed for anxiety., Disp: 60 capsule, Rfl: 0   insulin  glargine, 1 Unit Dial, (TOUJEO  SOLOSTAR) 300 UNIT/ML Solostar Pen, Inject 40-50 Units into the skin every morning., Disp: 4.5 mL, Rfl: 0   Insulin  Pen Needle 32G X 6 MM MISC, 1 each by Does not apply route daily at 2 PM., Disp: 100 each, Rfl: 1   meclizine (ANTIVERT) 25 MG  tablet, , Disp: , Rfl:    Melatonin 10 MG TABS, Take 10 mg by mouth at bedtime as needed (sleep). , Disp: , Rfl:    metFORMIN  (GLUCOPHAGE -XR) 750 MG 24 hr tablet, Take 2 tablets (1,500 mg total) by mouth daily with breakfast., Disp: 180 tablet, Rfl: 0   mirtazapine  (REMERON ) 7.5 MG tablet, Take 1 tablet (7.5 mg total) by mouth at bedtime as needed. For sleep, Disp: 30 tablet, Rfl: 1   naproxen  (NAPROSYN ) 500 MG tablet, Take 1 tablet (500 mg total) by mouth 2 (two) times daily as needed., Disp: 14 tablet, Rfl: 0   olmesartan  (BENICAR ) 40 MG tablet, Take 1 tablet (40 mg total) by mouth daily., Disp: 90 tablet, Rfl: 1   tirzepatide  (MOUNJARO ) 12.5 MG/0.5ML Pen, Inject 12.5 mg into the skin once a week., Disp: 6 mL, Rfl: 0   traMADol (ULTRAM) 50 MG tablet, Take 50 mg by mouth 2 (two) times daily as needed., Disp: , Rfl:    Vilazodone  HCl 20 MG TABS, TAKE 1 TABLET BY MOUTH DAILY, Disp: 90 tablet, Rfl: 1   ACCU-CHEK GUIDE TEST test strip, USE THREE TIMES DAILY TO  TEST BLOOD SUGAR. MORNING, NOON AND BEDTIME., Disp: 100 strip, Rfl: 0   montelukast  (SINGULAIR ) 10 MG tablet, Take 1 tablet (10 mg total) by mouth at bedtime. (Patient not taking: Reported on 10/13/2024), Disp: 90 tablet, Rfl: 1  Allergies  Allergen Reactions   Simvastatin     Muscle cramps    Trazodone  Hcl     It may have caused sob    Zetia  [Ezetimibe ] Other (See Comments)    Myalgia    Atorvastatin Other (See Comments)    muscle cramps   Compazine  Other (See Comments)    Twitching and spasms of neck muscles   Penicillins Rash    Reaction under 25, patient unsure that she still has an allergy    I personally reviewed active problem list, medication list, allergies, family history with the patient/caregiver today.   ROS  Ten systems reviewed and is negative except as mentioned in HPI    Objective Physical Exam VITALS: BP- 136/84  CONSTITUTIONAL: Patient appears well-developed and well-nourished. No distress. HEENT: Head atraumatic, normocephalic, neck supple. CARDIOVASCULAR: Normal rate, regular rhythm and normal heart sounds. No murmur heard. No BLE edema. PULMONARY: Effort normal and breath sounds normal. Lungs clear to auscultation. No respiratory distress. ABDOMINAL: There is no tenderness or distention. MUSCULOSKELETAL: Normal gait. Without gross motor or sensory deficit. PSYCHIATRIC: Patient has a normal mood and affect. Behavior is normal. Judgment and thought content normal.  Vitals:   10/13/24 1114  BP: 136/84  Pulse: 98  Resp: 16  SpO2: 100%  Weight: 246 lb 8 oz (111.8 kg)  Height: 5' 4 (1.626 m)    Body mass index is 42.31 kg/m.  Recent Results (from the past 2160 hours)  POCT glycosylated hemoglobin (Hb A1C)     Status: Abnormal   Collection Time: 07/21/24  3:42 PM  Result Value Ref Range   Hemoglobin A1C 7.3 (A) 4.0 - 5.6 %   HbA1c POC (<> result, manual entry)     HbA1c, POC (prediabetic range)     HbA1c, POC (controlled diabetic range)     POCT glycosylated hemoglobin (Hb A1C)     Status: Abnormal   Collection Time: 10/13/24 11:21 AM  Result Value Ref Range   Hemoglobin A1C 7.2 (A) 4.0 - 5.6 %   HbA1c POC (<> result, manual entry)  HbA1c, POC (prediabetic range)     HbA1c, POC (controlled diabetic range)       PHQ2/9:    10/13/2024   11:07 AM 08/19/2024    8:45 AM 08/10/2024    1:35 PM 07/26/2024    8:17 AM 07/21/2024    3:37 PM  Depression screen PHQ 2/9  Decreased Interest 0  0 1 3  Down, Depressed, Hopeless 0  0 1 3  PHQ - 2 Score 0  0 2 6  Altered sleeping     3  Tired, decreased energy     3  Change in appetite     3  Feeling bad or failure about yourself      1  Trouble concentrating     1  Moving slowly or fidgety/restless     0  Suicidal thoughts     0  PHQ-9 Score     17  Difficult doing work/chores     Very difficult     Information is confidential and restricted. Go to Review Flowsheets to unlock data.    phq 9 is seeing psychiatrist   Fall Risk:    10/13/2024   11:06 AM 08/10/2024    1:35 PM 07/26/2024    8:17 AM 07/21/2024    3:36 PM 05/27/2024    3:15 PM  Fall Risk   Falls in the past year? 0 0 0 0 0  Number falls in past yr: 0   0 0  Injury with Fall? 0   0 0  Risk for fall due to : No Fall Risks   No Fall Risks No Fall Risks  Follow up Falls evaluation completed   Falls evaluation completed Falls prevention discussed;Education provided;Falls evaluation completed     Assessment & Plan Type 2 diabetes mellitus, uncontrolled with associated HTN, dyslipidemia  A1c decreased slightly from 7.3% to 7.2%, indicating suboptimal control. Difficulty with diet adherence and medication compliance noted. Persistent hunger and suboptimal glucose control observed. - Increase Mounjaro  to 15 mg weekly. - Ensure consistent use of metformin , taking one pill with breakfast and one later in the day. - Consider continuous glucose monitor if insurance covers it. - Monitor fasting glucose levels; if  consistently below 100 mg/dL for three days, decrease Toujeo  by 2 units every three days.  Obesity / Morbid  BMI remains over 40, indicating persistent obesity. Emphasized need for dietary changes and consistent medication use for weight loss. - Increase Mounjaro  to 15 mg weekly to aid in weight loss. - Encourage dietary changes and portion control. - discuss stopping Remeron  given for sleep by psychiatrist   Hypertension/obesity/DM syndrome Blood pressure is 136/84 mmHg, above target range. Previously tried HCTZ without adverse effects. - Switch from olmesartan  to olmesartan /HCTZ combination to improve blood pressure control.  Dyslipidemia Not taking cholesterol medication due to intolerance to statins and other medications. Discussed fish oil as an alternative. - Initiate fish oil 2 grams twice daily.  Allergic rhinitis Reports episodes of cough and shortness of breath, possibly allergy-related. Montelukast  was prescribed but not picked up. - Send prescription for montelukast  to pharmacy. - If symptoms persist, consider pulmonary function tests or referral to pulmonology.  OSA - not using CPAP  Chronic Back pain and FMS - used to see psyatrist and has upcoming visit with pain clinic   Major Depression Recurrent Chronic - under the care of psychiatrist - also seeing therapist

## 2024-10-14 ENCOUNTER — Ambulatory Visit: Admitting: Professional Counselor

## 2024-10-17 ENCOUNTER — Other Ambulatory Visit (HOSPITAL_COMMUNITY): Payer: Self-pay

## 2024-10-17 ENCOUNTER — Telehealth: Payer: Self-pay | Admitting: Pharmacy Technician

## 2024-10-17 NOTE — Telephone Encounter (Signed)
 Pharmacy Patient Advocate Encounter  Received notification from [employer] that Prior Authorization for FreeStyle Libre 3 Plus Sensor has been APPROVED from 10827/25 to 10/17/25. Ran test claim, Copay is $0.00. This test claim was processed through [facility]- copay amounts may vary at other pharmacies due to pharmacy/plan contracts, or as the patient moves through the different stages of their insurance plan.   PA #/Case ID/Reference #: [id number]

## 2024-10-17 NOTE — Telephone Encounter (Signed)
 Pharmacy Patient Advocate Encounter   Received notification from CoverMyMeds that prior authorization for FreeStyle Libre 3 Plus Sensor is required/requested.   Insurance verification completed.   The patient is insured through [facility]   Per test claim: PA required; PA submitted to above mentioned insurance via CoverMyMeds Key/confirmation #/EOC [id number] Status is pending

## 2024-10-19 ENCOUNTER — Encounter: Payer: Self-pay | Admitting: Family Medicine

## 2024-10-19 ENCOUNTER — Ambulatory Visit: Admitting: Family Medicine

## 2024-10-19 ENCOUNTER — Ambulatory Visit: Admitting: Nurse Practitioner

## 2024-10-20 ENCOUNTER — Encounter: Payer: Self-pay | Admitting: Student in an Organized Health Care Education/Training Program

## 2024-10-20 ENCOUNTER — Ambulatory Visit
Attending: Student in an Organized Health Care Education/Training Program | Admitting: Student in an Organized Health Care Education/Training Program

## 2024-10-20 VITALS — BP 152/93 | HR 75 | Temp 97.8°F | Resp 16 | Ht 64.0 in | Wt 241.0 lb

## 2024-10-20 DIAGNOSIS — M12811 Other specific arthropathies, not elsewhere classified, right shoulder: Secondary | ICD-10-CM | POA: Diagnosis present

## 2024-10-20 DIAGNOSIS — M47816 Spondylosis without myelopathy or radiculopathy, lumbar region: Secondary | ICD-10-CM | POA: Diagnosis present

## 2024-10-20 DIAGNOSIS — M12812 Other specific arthropathies, not elsewhere classified, left shoulder: Secondary | ICD-10-CM | POA: Insufficient documentation

## 2024-10-20 DIAGNOSIS — M5416 Radiculopathy, lumbar region: Secondary | ICD-10-CM | POA: Diagnosis not present

## 2024-10-20 DIAGNOSIS — G894 Chronic pain syndrome: Secondary | ICD-10-CM | POA: Diagnosis present

## 2024-10-20 DIAGNOSIS — M797 Fibromyalgia: Secondary | ICD-10-CM | POA: Insufficient documentation

## 2024-10-20 DIAGNOSIS — G8929 Other chronic pain: Secondary | ICD-10-CM | POA: Insufficient documentation

## 2024-10-20 LAB — OPHTHALMOLOGY REPORT-SCANNED

## 2024-10-20 MED ORDER — TRAMADOL HCL 50 MG PO TABS: 50.0000 mg | ORAL_TABLET | Freq: Two times a day (BID) | ORAL | 0 refills | Status: DC | PRN

## 2024-10-20 NOTE — Progress Notes (Signed)
 PROVIDER NOTE: Interpretation of information contained herein should be left to medically-trained personnel. Specific patient instructions are provided elsewhere under Patient Instructions section of medical record. This document was created in part using AI and STT-dictation technology, any transcriptional errors that may result from this process are unintentional.  Patient: Lindsay Mcdonald  Service: E/M   PCP: Sowles, Krichna, MD  DOB: [birth year]  DOS: 10/20/2024  Provider: Wallie Sherry, MD  MRN: [id number]  Delivery: Face-to-face  Specialty: Interventional Pain Management  Type: Established Patient  Setting: Ambulatory outpatient facility  Specialty designation: 09  Referring Prov.: Sowles, Krichna, MD  Location: Outpatient office facility       History of present illness (HPI) Lindsay Mcdonald, a 63 y.o. year old female, is here today because of her Lumbar facet arthropathy [M47.816]. [name] primary complain today is Back Pain (R>L), Hip Pain (R>L), and Leg Pain (Right )   Pain Assessment: Severity of Chronic pain is reported as a 5 /10. Location: Back Lower, Right, Left/radaites from lower back to hips bilateral into posterior upper right leg. Onset: More than a month ago. Quality: Aching. Timing: Intermittent. Modifying factor(s): Ibuprofen and rest. Vitals:  height is 5' 4 (1.626 m) and weight is 241 lb (109.3 kg). Her temporal temperature is 97.8 F (36.6 C). Her blood pressure is 152/93 (abnormal) and her pulse is 75. Her respiration is 16 and oxygen saturation is 100%.  BMI: Estimated body mass index is 41.37 kg/m as calculated from the following:   Height as of this encounter: 5' 4 (1.626 m).   Weight as of this encounter: 241 lb (109.3 kg).  Last encounter: 07/26/2024. Last procedure: 08/10/2024.  Reason for encounter: post-procedure evaluation and assessment.   Post-Procedure Evaluation   Type: Lumbar Facet, Medial Branch Block(s) (w/ fluoroscopic mapping) #1   Laterality: Bilateral  Level: L3, L4, and L5 Medial Branch/Dorsal Rami Level(s). Injecting these levels blocks the L3-4 and L4-5 lumbar facet joints. Imaging: Fluoroscopic guidance Spinal ([id number]) Anesthesia: Local anesthesia (1-2% Lidocaine ) Sedation: No Sedation                       DOS: 08/10/2024 Performed by: Wallie Sherry, MD  Primary Purpose: Diagnostic/Therapeutic Indications: Low back pain severe enough to impact quality of life or function. 1. Lumbar facet arthropathy   2. Lumbar spondylosis   3. Chronic pain syndrome    NAS-11 Pain score:   Pre-procedure: 10-Worst pain ever/10   Post-procedure: 0-No pain/10     Effectiveness:  Initial hour after procedure: 100 %  Subsequent 4-6 hours post-procedure: 100 %. Analgesia past initial 6 hours: 0 % (Pain started to return on day 2 and each day pain gradually returned to where it was before.)  Ongoing improvement:  Analgesic:  0% Function: Back to baseline ROM: Back to baseline   Pharmacotherapy Assessment  Rx for Tramadol today Monitoring: [place] PMP: PDMP reviewed during this encounter.       Pharmacotherapy: No side-effects or adverse reactions reported. Compliance: No problems identified. Effectiveness: Clinically acceptable.  Bonner Norris, RN  10/20/2024  1:17 PM  Sign when Signing Visit Safety precautions to be maintained throughout the outpatient stay will include: orient to surroundings, keep bed in low position, maintain call bell within reach at all times, provide assistance with transfer out of bed and ambulation.           HPI from initial clinic visit: 07/26/24 Lindsay Mcdonald is a 63 year old female with chronic low back pain,  bilateral shoulder pain, and left hip pain who presents for pain management consultation. She has been referred by Dr. Avanell for further evaluation and management of her chronic pain conditions.   She has chronic low back pain radiating into her right leg, stopping above the  knee, persisting for about two years. A fall over fifteen years ago is noted, and she has been managing the pain with epidural injections at the [facility]. Initially, these injections provided relief, but their effectiveness has diminished over time. Her last injection on July 11th provided only a few days of relief. An MRI conducted six months prior revealed a disc herniation at L5 S1 and arthritis at L4, L5, and L5 S1.   She experiences bilateral shoulder pain with a history of torn rotator cuffs identified in an MRI two years ago. Steroid injections have been somewhat effective but elevate her blood sugar levels due to type 2 diabetes. She is on insulin  for diabetes management. The left shoulder, despite having a smaller tear, is more painful than the right.   Her left hip pain has been treated with injections. No burning or tingling in her feet recently, although she has experienced these symptoms in the past.   She has fibromyalgia, which amplifies her pain experience. She attempted physical therapy once but could not continue due to insurance issues. Muscle spasms are managed with muscle relaxers prescribed by neurology, although she experiences breakthrough pain. She does not currently take magnesium supplements but tries to maintain electrolyte balance.   Her current medications include tramadol, which she takes as needed, and Aleve . She recently picked up a prescription for tramadol and plans to continue its use as necessary.       Procedural history: 07/01/2024: Right L5-S1 transforaminal ESI (dexamethasone  10 mg) 03/18/2024: Right hip joint injection (95% relief) 01/01/2024: Right L5-S1 transforaminal ESI (80% relief, Celestone  9 mg) 12/04/2023: Bilateral L5-S1 transforaminal ESI (90% relief for 2 weeks then gradual return of pain on the right 50% and continued improvement on the left, dexamethasone  10 mg) 10/01/2023: right trochanteric bursa injection (little relief)   UDS:  No  results found for: SUMMARY  No results found for: CBDTHCR No results found for: D8THCCBX No results found for: D9THCCBX  ROS  Constitutional: Denies any fever or chills Gastrointestinal: No reported hemesis, hematochezia, vomiting, or acute GI distress Musculoskeletal: low back, right hip pain, bilateral shoulder pain yes Neurological: No reported episodes of acute onset apraxia, aphasia, dysarthria, agnosia, amnesia, paralysis, loss of coordination, or loss of consciousness  Medication Review  Accu-Chek Softclix Lancets, Albuterol -Budesonide, FreeStyle Libre 3 Plus Sensor, Glucagon , Insulin  Pen Needle, Melatonin, Vilazodone  HCl, aspirin EC, baclofen , estradiol -norethindrone, fluticasone , hydrOXYzine , ibuprofen, icosapent Ethyl, insulin  glargine (1 Unit Dial), meclizine, metFORMIN , mirtazapine , montelukast , naproxen  sodium, olmesartan -hydrochlorothiazide , tirzepatide , and traMADol  History Review  Allergy: Ms. Imler is allergic to simvastatin, trazodone  hcl, zetia  [ezetimibe ], atorvastatin, compazine , and penicillins. Drug: Ms. Minckler  reports no history of drug use. Alcohol:  reports no history of alcohol use. Tobacco:  reports that she has never smoked. She has never used smokeless tobacco. Social: Ms. Lozoya  reports that she has never smoked. She has never used smokeless tobacco. She reports that she does not drink alcohol and does not use drugs. Medical:  has a past medical history of Anxiety, Arthritis (03/2020), Chronic pain of right knee, Chronic sinusitis, DDD (degenerative disc disease), cervical, Depression, Diabetes mellitus, Fibromyalgia, GERD (gastroesophageal reflux disease), High cholesterol, Hyperlipidemia, Hypertension, Insomnia, Muscle pain, Nasal polyp, Neuromuscular disorder (HCC), Obesity,  Palpitations, Tendonitis of knee, left (04/2020), Vitamin D  deficiency, and Vitamin D  deficiency. Surgical: Ms. Brents  has a past surgical history that includes  Breast reduction surgery; nasal endoscopic; removal of ovary; Oophorectomy (?); Reduction mammaplasty (Bilateral, 2000); Image guided sinus surgery (N/A, 05/16/2020); Sphenoidectomy (Right, 05/16/2020); Ethmoidectomy (Right, 05/16/2020); Maxillary antrostomy (Bilateral, 05/16/2020); Frontal sinus exploration (Right, 05/16/2020); Excision oral tumor (N/A, 05/16/2020); and Colonoscopy with propofol  (N/A, 02/03/2023). Family: family history includes Alcohol abuse in her daughter and daughter; Breast cancer (age of onset: 54) in her maternal aunt; Breast cancer (age of onset: 75) in her mother; Cancer (age of onset: 70) in her half-sister; Cancer (age of onset: 46) in her mother; Dementia in her mother; Diabetes in her daughter, father, and mother; Gout in her mother; Kidney cancer in her maternal aunt; Kidney disease in her maternal aunt; Stroke in her maternal uncle.  Laboratory Chemistry Profile   Renal Lab Results  Component Value Date   BUN 21 06/17/2024   CREATININE 0.93 06/17/2024   LABCREA 164 05/27/2024   BCR 23 06/17/2024   GFRAA 93 02/22/2021   GFRNONAA >60 02/14/2024    Hepatic Lab Results  Component Value Date   AST 22 05/27/2024   ALT 19 05/27/2024   ALBUMIN 3.2 (L) 02/14/2024   ALKPHOS 66 02/14/2024   LIPASE 49 02/14/2024    Electrolytes Lab Results  Component Value Date   NA 142 06/17/2024   K 4.3 06/17/2024   CL 104 06/17/2024   CALCIUM  9.3 06/17/2024   MG 1.9 08/13/2022    Bone Lab Results  Component Value Date   VD25OH 22 (L) 06/27/2016    Inflammation (CRP: Acute Phase) (ESR: Chronic Phase) Lab Results  Component Value Date   ESRSEDRATE 2 07/20/2012         Note: Above Lab results reviewed.  Recent Imaging Review  DG PAIN CLINIC C-ARM 1-60 MIN NO REPORT Fluoro was used, but no Radiologist interpretation will be provided.  Please refer to NOTES tab for provider progress note. Note: Reviewed        Physical Exam  Vitals: BP (!) 152/93 (Patient Position:  Sitting, Cuff Size: Large)   Pulse 75   Temp 97.8 F (36.6 C) (Temporal)   Resp 16   Ht 5' 4 (1.626 m)   Wt 241 lb (109.3 kg)   SpO2 100%   BMI 41.37 kg/m  BMI: Estimated body mass index is 41.37 kg/m as calculated from the following:   Height as of this encounter: 5' 4 (1.626 m).   Weight as of this encounter: 241 lb (109.3 kg). Ideal: Ideal body weight: 54.7 kg (120 lb 9.5 oz) Adjusted ideal body weight: 76.5 kg (168 lb 12.1 oz) General appearance: Well nourished, well developed, and well hydrated. In no apparent acute distress Mental status: Alert, oriented x 3 (person, place, & time)       Respiratory: No evidence of acute respiratory distress Eyes: PERLA   Assessment   Diagnosis  1. Lumbar facet arthropathy   2. Lumbar spondylosis   3. Chronic radicular lumbar pain (right L5/S1 disc herniation)   4. Rotator cuff arthropathy of both shoulders (left>right)   5. Fibromyalgia   6. Chronic pain syndrome      Updated Problems: No problems updated.  Plan of Care  Assessment and Plan    Chronic low back pain with lumbar spondylosis and radiculopathy   Previous lumbar facet injections were ineffective, so no further injections or ablation are recommended. Order a baseline urine  screen. Prescribe tramadol 50 mg every 12 hours as needed, with a quantity of 30. Discuss potential future use of buprenorphine with the nurse practitioner.  Right shoulder arthritis   Focus on managing pain with medication. Discuss potential nerve blocks for shoulder arthritis with the nurse practitioner in future follow-up.  Right hip pain with suspected osteoarthritis and trochanteric bursitis   Pain may result from both bone-on-bone issues and an inflamed bursa. Consider a bursa injection in the future. Discuss this option for hip pain in future follow-up.  Type 2 diabetes mellitus   Advise minimizing NSAID use to reduce the risk of kidney injury.       Ms. Miana Politte has a current  medication list which includes the following long-term medication(s): estradiol -norethindrone, icosapent ethyl, toujeo  solostar, metformin , mirtazapine , montelukast , olmesartan -hydrochlorothiazide , and vilazodone  hcl.  Pharmacotherapy (Medications Ordered): Meds ordered this encounter  Medications   traMADol (ULTRAM) 50 MG tablet    Sig: Take 1 tablet (50 mg total) by mouth every 12 (twelve) hours as needed for severe pain (pain score 7-10).    Dispense:  30 tablet    Refill:  0    Fill one day early if pharmacy is closed on scheduled refill date. Do not fill until: To last until:   Orders:  Orders Placed This Encounter  Procedures   Compliance Drug Analysis, Ur    Volume: 30 ml(s). Minimum 3 ml of urine is needed. Document temperature of fresh sample. Indications: Long term (current) use of opiate analgesic (Z79.891) Test#: [id number] (Comprehensive Profile)    Release to patient:   Immediate     Bilateral L3, L4, L5 medial branch nerve block 08/10/2024- not effective   Return in about 27 days (around 11/16/2024) for Emmy Blanch, MM.    Recent Visits Date Type Provider Dept  08/10/24 Procedure visit Marcelino Nurse, MD [facility]  07/26/24 Office Visit Marcelino Nurse, MD [facility]  Showing recent visits within past 90 days and meeting all other requirements Today's Visits Date Type Provider Dept  10/20/24 Office Visit Marcelino Nurse, MD [facility]  Showing today's visits and meeting all other requirements Future Appointments Date Type Provider Dept  11/15/24 Appointment Patel, Seema K, NP [facility]  Showing future appointments within next 90 days and meeting all other requirements  I discussed the assessment and treatment plan with the patient. The patient was provided an opportunity to ask questions and all were answered. The patient agreed with the plan and demonstrated an understanding of the instructions.  Patient advised to call  back or seek an in-person evaluation if the symptoms or condition worsens.  I personally spent a total of 30 minutes in the care of the patient today including preparing to see the patient, getting/reviewing separately obtained history, performing a medically appropriate exam/evaluation, counseling and educating, placing orders, and documenting clinical information in the EHR.   Note by: Nurse Marcelino, MD (TTS and AI technology used. I apologize for any typographical errors that were not detected and corrected.) Date: 10/20/2024; Time: 2:10 PM

## 2024-10-20 NOTE — Progress Notes (Signed)
 Safety precautions to be maintained throughout the outpatient stay will include: orient to surroundings, keep bed in low position, maintain call bell within reach at all times, provide assistance with transfer out of bed and ambulation.

## 2024-10-21 ENCOUNTER — Encounter: Payer: Self-pay | Admitting: Family Medicine

## 2024-10-21 ENCOUNTER — Ambulatory Visit: Admitting: Professional Counselor

## 2024-10-21 ENCOUNTER — Other Ambulatory Visit: Payer: Self-pay | Admitting: Family Medicine

## 2024-10-22 ENCOUNTER — Encounter: Payer: Self-pay | Admitting: Family Medicine

## 2024-10-24 LAB — COMPLIANCE DRUG ANALYSIS, UR

## 2024-10-25 ENCOUNTER — Telehealth: Payer: Self-pay | Admitting: Psychiatry

## 2024-10-25 ENCOUNTER — Other Ambulatory Visit: Payer: Self-pay | Admitting: Family Medicine

## 2024-10-25 MED ORDER — OLMESARTAN MEDOXOMIL-HCTZ 40-12.5 MG PO TABS: 1.0000 | ORAL_TABLET | Freq: Every day | ORAL | 0 refills | Status: DC

## 2024-10-25 NOTE — Telephone Encounter (Signed)
 I received an accommodation request from [employer] to work from home for this patient.  Patient was last evaluated by video in August.  Will need an in office visit prior to making a decision.  I am also attempting to coordinate care with psychotherapist Ms. Veva.

## 2024-10-28 ENCOUNTER — Telehealth: Admitting: Psychiatry

## 2024-10-29 ENCOUNTER — Encounter: Payer: Self-pay | Admitting: Family Medicine

## 2024-10-31 ENCOUNTER — Encounter: Payer: Self-pay | Admitting: Psychiatry

## 2024-10-31 ENCOUNTER — Ambulatory Visit: Admitting: Psychiatry

## 2024-10-31 ENCOUNTER — Other Ambulatory Visit: Payer: Self-pay

## 2024-10-31 VITALS — BP 101/71 | HR 99 | Temp 96.5°F | Ht 64.0 in | Wt 237.6 lb

## 2024-10-31 DIAGNOSIS — F331 Major depressive disorder, recurrent, moderate: Secondary | ICD-10-CM | POA: Diagnosis not present

## 2024-10-31 DIAGNOSIS — F411 Generalized anxiety disorder: Secondary | ICD-10-CM | POA: Diagnosis not present

## 2024-10-31 MED ORDER — MIRTAZAPINE 7.5 MG PO TABS
7.5000 mg | ORAL_TABLET | Freq: Every evening | ORAL | Status: DC | PRN
Start: 2024-10-31 — End: 2024-11-13

## 2024-10-31 MED ORDER — VILAZODONE HCL 10 MG PO TABS
10.0000 mg | ORAL_TABLET | Freq: Every day | ORAL | Status: DC
Start: 2024-10-31 — End: 2024-11-16

## 2024-10-31 MED ORDER — CITALOPRAM HYDROBROMIDE 10 MG PO TABS: 10.0000 mg | ORAL_TABLET | Freq: Every day | ORAL | 1 refills | Status: DC

## 2024-10-31 NOTE — Progress Notes (Signed)
 [facility] MD OP Progress Note  10/31/2024 4:24 PM Lindsay Mcdonald  MRN:  [id number]  Chief Complaint:  Chief Complaint  Patient presents with   Follow-up   Anxiety   Depression   Medication Refill   Discussed the use of AI scribe software for clinical note transcription with the patient, who gave verbal consent to proceed.  History of Present Illness Lindsay Mcdonald is a 63 year old AAF, currently employed , divorced lives in [place], has a history of depression , anxiety, chronic pain, care giver burden , DM ,fibromyalgia , morbid obesity , hypertension , OSA ,hyperlipidemia was evaluated by telemedicine was evaluated in office today.  Multiple stressors, including recent changes at work with a new nurse, learning disability, increased job demands, and the emotional strain of assisting clients in hardship, contribute to her feeling overwhelmed. 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 She also identifies the ongoing process of transferring her mother to a new facility as a significant source of stress and sadness, noting that even after the transfer, she continues to experience sadness related to her mother's situation.  Persistent symptoms of depression, including low confidence, sadness, anhedonia, concentration problems and difficulty managing quality expectations at work, affect her daily functioning. She reports episodes of reduced appetite with recent improvement , which she has discussed with her primary care provider. Ongoing anxiety, particularly related to her work environment, commuting, and her mother's transition, remains a concern. She sometimes uses hydroxyzine  as needed to calm herself during acute stress, especially when dealing with the nursing home or work-related anxiety, but notes that it does not address her depressive symptoms. She expresses concern about medication side effects,  particularly those affecting her heart or causing her to feel racing or sedated, and prefers to avoid high-dose medications.  Her current regimen includes vilazodone  20 mg daily and hydroxyzine  as needed for anxiety.  She does take mirtazapine  however on an as-needed basis.  She reports when she takes it that does help with her sleep.  She wants to keep it that way and would like to take it on an as-needed basis only.  She has been alternating that with melatonin.  She sees her therapist, Lindsay Mcdonald, about once a month. Additional stressors related to her living situation, specifically ongoing issues with a neighbor, continue to affect her. She has requested temporary work-from-home accommodations due to her anxiety and difficulty with the office environment. She believes that working from home may help reduce her anxiety related to commuting and the call center setting.  She denies suicidal thoughts homicidality or perceptual disturbances.  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     Visit Diagnosis:    ICD-10-CM   1. Major depressive disorder, recurrent episode, moderate (HCC)  F33.1 Vilazodone  HCl (VIIBRYD ) 10 MG TABS    citalopram (CELEXA) 10 MG tablet    mirtazapine  (REMERON ) 7.5 MG tablet    2. Generalized anxiety disorder  F41.1 Vilazodone  HCl (VIIBRYD ) 10 MG TABS    citalopram (CELEXA) 10 MG tablet      Past Psychiatric History: I have reviewed past psychiatric history from progress note on 02/08/2024.  Past trials of medications include sertraline , Lexapro , duloxetine , Prozac , nortriptyline, Xanax , trazodone   Past Medical  History:  Past Medical History:  Diagnosis Date   Anxiety    Arthritis 03/2020   osteoarthritis both knees   Chronic pain of right knee     Chronic sinusitis    DDD (degenerative disc disease), cervical    cervical radiculopathy   Depression    Diabetes mellitus    insulin  dependant   Fibromyalgia    GERD (gastroesophageal reflux disease)    High cholesterol    Hyperlipidemia    Hypertension    Insomnia    Muscle pain    Nasal polyp    Neuromuscular disorder (HCC)    diabetic neuropathy   Obesity    Palpitations    Tendonitis of knee, left 04/2020   received steroid injection   Vitamin D  deficiency    Vitamin D  deficiency     Past Surgical History:  Procedure Laterality Date   BREAST REDUCTION SURGERY     COLONOSCOPY WITH PROPOFOL  N/A 02/03/2023   Procedure: COLONOSCOPY WITH PROPOFOL ;  Surgeon: Therisa Bi, MD;  Location: [facility] ENDOSCOPY;  Service: Gastroenterology;  Laterality: N/A;   ETHMOIDECTOMY Right 05/16/2020   Procedure: ETHMOIDECTOMY;  Surgeon: Edda Mt, MD;  Location: [facility] ORS;  Service: ENT;  Laterality: Right;   EXCISION ORAL TUMOR N/A 05/16/2020   Procedure: EXCISION OF SOFT PALATE PAPILLOMA;  Surgeon: Edda Mt, MD;  Location: [facility] ORS;  Service: ENT;  Laterality: N/A;   FRONTAL SINUS EXPLORATION Right 05/16/2020   Procedure: FRONTAL SINUS EXPLORATION;  Surgeon: Edda Mt, MD;  Location: [facility] ORS;  Service: ENT;  Laterality: Right;   IMAGE GUIDED SINUS SURGERY N/A 05/16/2020   Procedure: IMAGE GUIDED SINUS SURGERY;  Surgeon: Edda Mt, MD;  Location: [facility] ORS;  Service: ENT;  Laterality: N/A;   MAXILLARY ANTROSTOMY Bilateral 05/16/2020   Procedure: MAXILLARY ANTROSTOMY with tissue removal;  Surgeon: Edda Mt, MD;  Location: [facility] ORS;  Service: ENT;  Laterality: Bilateral;   nasal endoscopic     OOPHORECTOMY  ?   one ovary removed    REDUCTION MAMMAPLASTY Bilateral 2000   removal of ovary     SPHENOIDECTOMY Right 05/16/2020   Procedure: SPHENOIDECTOMY;  Surgeon: Edda Mt, MD;  Location: [facility] ORS;  Service: ENT;  Laterality: Right;    Family Psychiatric History: Reviewed  family psychiatric history from progress note on 02/08/2024.  Family History:  Family History  Problem Relation Age of Onset   Dementia Mother    Diabetes Mother    Cancer Mother 28       Breast   Breast cancer Mother 73   Gout Mother    Diabetes Father    Breast cancer Maternal Aunt 13   Kidney cancer Maternal Aunt    Kidney disease Maternal Aunt    Stroke Maternal Uncle    Alcohol abuse Daughter    Diabetes Daughter        Oldest Daughter   Alcohol abuse Daughter    Cancer Half-Sister 29       breast cancer   Prostate cancer Neg Hx    Bladder Cancer Neg Hx     Social History: I have reviewed social history from progress note on 02/08/2024. Social History   Socioeconomic History   Marital status: Divorced    Spouse name: Not on file   Number of children: 2   Years of education: Not on file   Highest education level: Some college, no degree  Occupational History   Occupation: engineer, mining   Tobacco Use   Smoking status: Never  Smokeless tobacco: Never  Vaping Use   Vaping status: Former  Substance and Sexual Activity   Alcohol use: No    Alcohol/week: 0.0 standard drinks of alcohol   Drug use: No   Sexual activity: Not Currently    Partners: Male    Birth control/protection: None  Other Topics Concern   Not on file  Social History Narrative   Divorced and he died since the divorce   Working at patent attorney.    Social Drivers of Health   Financial Resource Strain: Medium Risk (07/20/2024)   Overall Financial Resource Strain (CARDIA)    Difficulty of Paying Living Expenses: Somewhat hard  Food Insecurity: Food Insecurity Present (07/20/2024)   Hunger Vital Sign    Worried About Running Out of Food in the Last Year: Sometimes true    Ran Out of Food in the Last Year: Sometimes true  Transportation Needs: No Transportation Needs (07/20/2024)   PRAPARE - Administrator, Civil Service (Medical): No    Lack of Transportation (Non-Medical): No   Physical Activity: Inactive (07/20/2024)   Exercise Vital Sign    Days of Exercise per Week: 0 days    Minutes of Exercise per Session: Not on file  Stress: Stress Concern Present (07/20/2024)   [employer] of Occupational Health - Occupational Stress Questionnaire    Feeling of Stress: Very much  Social Connections: Unknown (07/20/2024)   Social Connection and Isolation Panel    Frequency of Communication with Friends and Family: Three times a week    Frequency of Social Gatherings with Friends and Family: Patient declined    Attends Religious Services: Patient declined    Database Administrator or Organizations: Patient declined    Attends Banker Meetings: Not on file    Marital Status: Divorced    Allergies:  Allergies  Allergen Reactions   Simvastatin     Muscle cramps    Trazodone  Hcl     It may have caused sob    Zetia  [Ezetimibe ] Other (See Comments)    Myalgia    Atorvastatin Other (See Comments)    muscle cramps   Compazine  Other (See Comments)    Twitching and spasms of neck muscles   Penicillins Rash    Reaction under 25, patient unsure that she still has an allergy    Metabolic Disorder Labs: Lab Results  Component Value Date   HGBA1C 7.2 (A) 10/13/2024   MPG 166 02/19/2022   MPG 146 04/09/2020   No results found for: PROLACTIN Lab Results  Component Value Date   CHOL 183 05/27/2024   TRIG 83 05/27/2024   HDL 59 05/27/2024   CHOLHDL 3.1 05/27/2024   VLDL 29 06/27/2016   LDLCALC 107 (H) 05/27/2024   LDLCALC 134 (H) 05/15/2023   Lab Results  Component Value Date   TSH 3.27 10/08/2023   TSH 3.00 02/22/2021    Therapeutic Level Labs: No results found for: LITHIUM No results found for: VALPROATE No results found for: CBMZ  Current Medications: Current Outpatient Medications  Medication Sig Dispense Refill   citalopram (CELEXA) 10 MG tablet Take 1 tablet (10 mg total) by mouth daily with breakfast. 30 tablet 1    Vilazodone  HCl (VIIBRYD ) 10 MG TABS Take 1 tablet (10 mg total) by mouth daily for 7 days.     Accu-Chek Softclix Lancets lancets USE TO TEST BLOOD SUGAR THREE TIMES DAILY. MORNING, NOON AND BEDTIME. 100 each 0   Albuterol -Budesonide (AIRSUPRA ) 90-80 MCG/ACT  AERO Inhale 2 puffs into the lungs in the morning, at noon, in the evening, and at bedtime. 10.7 g 0   aspirin EC 81 MG tablet Take 81 mg by mouth daily. Swallow whole.     baclofen  (LIORESAL ) 10 MG tablet Take 2 tablets (20 mg total) by mouth at bedtime.     Continuous Glucose Sensor (FREESTYLE LIBRE 3 PLUS SENSOR) MISC 1 each by Other route as directed. Change sensor every 15 days. 3 each 1   estradiol -norethindrone (COMBIPATCH) 0.05-0.14 MG/DAY Place 1 patch onto the skin.     fluticasone  (FLONASE ) 50 MCG/ACT nasal spray Place 2 sprays into both nostrils daily as needed for allergies or rhinitis.     GVOKE HYPOPEN  2-PACK 1 MG/0.2ML SOAJ Inject 1 mg into the skin as needed (for hypoglycemia episodes (glucose below 60 and symptomatic)). 0.4 mL 1   hydrOXYzine  (VISTARIL ) 25 MG capsule Take 1-2 capsules (25-50 mg total) by mouth daily as needed for anxiety. 60 capsule 0   icosapent Ethyl (VASCEPA) 1 g capsule Take 2 capsules (2 g total) by mouth 2 (two) times daily. 360 capsule 1   insulin  glargine, 1 Unit Dial, (TOUJEO  SOLOSTAR) 300 UNIT/ML Solostar Pen Inject 40-50 Units into the skin every morning. 4.5 mL 0   Insulin  Pen Needle 32G X 6 MM MISC 1 each by Does not apply route daily at 2 PM. 100 each 1   meclizine (ANTIVERT) 25 MG tablet      Melatonin 10 MG TABS Take 10 mg by mouth at bedtime as needed (sleep).      metFORMIN  (GLUCOPHAGE -XR) 750 MG 24 hr tablet Take 2 tablets (1,500 mg total) by mouth daily with breakfast. 180 tablet 0   mirtazapine  (REMERON ) 7.5 MG tablet Take 1 tablet (7.5 mg total) by mouth at bedtime as needed. For sleep     montelukast  (SINGULAIR ) 10 MG tablet Take 1 tablet (10 mg total) by mouth at bedtime. 90 tablet 1    olmesartan -hydrochlorothiazide  (BENICAR  HCT) 40-12.5 MG tablet Take 1 tablet by mouth daily. 90 tablet 0   tirzepatide  (MOUNJARO ) 15 MG/0.5ML Pen Inject 15 mg into the skin once a week. 6 mL 0   traMADol (ULTRAM) 50 MG tablet Take 1 tablet (50 mg total) by mouth every 12 (twelve) hours as needed for severe pain (pain score 7-10). 30 tablet 0   No current facility-administered medications for this visit.     Musculoskeletal: Strength & Muscle Tone: within normal limits Gait & Station: normal Patient leans: N/A  Psychiatric Specialty Exam: Review of Systems  Psychiatric/Behavioral:  Positive for decreased concentration, dysphoric mood and sleep disturbance. The patient is nervous/anxious.     Blood pressure 101/71, pulse 99, temperature (!) 96.5 F (35.8 C), temperature source Temporal, height 5' 4 (1.626 m), weight 237 lb 9.6 oz (107.8 kg).Body mass index is 40.78 kg/m.  General Appearance: Fairly Groomed  Eye Contact:  Fair  Speech:  Clear and Coherent  Volume:  Normal  Mood:  Anxious and Depressed  Affect:  Appropriate  Thought Process:  Goal Directed and Descriptions of Associations: Intact  Orientation:  Full (Time, Place, and Person)  Thought Content: Logical   Suicidal Thoughts:  No  Homicidal Thoughts:  No  Memory:  Immediate;   Fair Recent;   Fair Remote;   Fair  Judgement:  Fair  Insight:  Fair  Psychomotor Activity:  Normal  Concentration:  Concentration: Fair and Attention Span: Fair  Recall:  [employer] of Knowledge: Fair  Language: Fair  Akathisia:  No  Handed:  Right  AIMS (if indicated): not done  Assets:  Communication Skills Desire for Improvement Housing Social Support Transportation  ADL's:  Intact  Cognition: WNL  Sleep:  Poor   Screenings: GAD-7    Loss Adjuster, Chartered Office Visit from 10/31/2024 in [facility] Office Visit from 07/21/2024 in [facility] Office Visit from  07/19/2024 in [facility] Office Visit from 05/27/2024 in [facility] Office Visit from 04/25/2024 in [facility]  Total GAD-7 Score 16 21 7 21 20    PHQ2-9    Flowsheet Row Office Visit from 10/31/2024 in [facility] Office Visit from 10/20/2024 in [facility] Visit from 10/13/2024 in [facility] Video Visit from 08/19/2024 in [facility] Procedure visit from 08/10/2024 in [facility] Total Score 5 0 0 6 0  PHQ-9 Total Score 16 -- -- 15 --   Flowsheet Row Office Visit from 10/31/2024 in [facility] Video Visit from 08/19/2024 in [facility] Office Visit from 07/19/2024 in [facility]  C-SSRS RISK CATEGORY No Risk No Risk No Risk     Assessment and Plan: Kymberlee Viger is a 63 year old African-American female who has a history of depression, anxiety was evaluated in office today.  Discussed assessment and plan as noted below.  Assessment & Plan 1. Major depressive disorder, recurrent episode, moderate (HCC)-unstable Worsening depression symptoms mostly related to situational stressors including work as well as mother who is currently struggling with health, current dosage of Viibryd  not effective, higher dosage caused side effects.  Agreeable to tapering of Viibryd  and adding Celexa low dosage.  Also agrees to have more frequent sessions with [name]. Taper off Viibryd , advised to take Viibryd  10 mg daily for a week and stop taking it. Start Celexa 10 mg daily after completely stopping Viibryd . Continue Mirtazapine  7.5 mg at  bedtime, currently uses it only as needed. Provided medication education including adverse side effects. Continue psychotherapy sessions.  2. Generalized anxiety disorder-unstable Current anxiety likely multifactorial including  work-related stressors. Continue Hydroxyzine  25 to 50 mg as needed. Start Celexa 10 mg daily with the plan to gradually increase the dosage. Encouraged to start psychotherapy sessions more frequently.  I have coordinated care with Ms. Mcdonald.  Also spent atleast 10 minutes completing FMLA/accommodation form as well as printed out letter in session based on patient request.  Patient advised to keep frequent follow-up visits for medication management as well as psychotherapy sessions and accommodation to work from home will be provided at this time for 4 weeks.  If patient does not make any progress with her current mental health on the current medication regimen, will consider referral out or more intensive treatment.  This was discussed with patient who agreed with plan.  Patient needs to be compliant with treatment/medication management as well as psychotherapy sessions as recommended for approval of any accommodation request.   Collaboration of Care: Collaboration of Care: Referral or follow-up with counselor/therapist AEB I have coordinated care with Ms. Mcdonald.  Patient/Guardian was advised Release of Information must be obtained prior to any record release in order to collaborate their care with an outside provider. Patient/Guardian was advised  if they have not already done so to contact the registration department to sign all necessary forms in order for us  to release information regarding their care.   Consent: Patient/Guardian gives verbal consent for treatment and assignment of benefits for services provided during this visit. Patient/Guardian expressed understanding and agreed to proceed.   I have spent atleast 40 minutes face to face with patient today which  includes the time spent for preparing to see the patient ( e.g., review of test, records ), obtaining and to review and separately obtained history , ordering medications ,psychoeducation and supportive psychotherapy and care coordination,as well as documenting clinical information in electronic health record,interpreting and completion of accommodation form per request by patient.   This note was generated in part or whole with voice recognition software. Voice recognition is usually quite accurate but there are transcription errors that can and very often do occur. I apologize for any typographical errors that were not detected and corrected.     Lindsay Fawley, MD 10/31/2024, 4:24 PM

## 2024-10-31 NOTE — Patient Instructions (Signed)
Citalopram Tablets What is this medication? CITALOPRAM (sye TAL oh pram) treats depression. It increases the amount of serotonin in the brain, a hormone that helps regulate mood. It belongs to a group of medications called SSRIs. This medicine may be used for other purposes; ask your health care provider or pharmacist if you have questions. COMMON BRAND NAME(S): Celexa What should I tell my care team before I take this medication? They need to know if you have any of these conditions: Bipolar disorder or a family history of bipolar disorder Bleeding disorder Glaucoma Heart disease History of irregular heartbeat Kidney disease Liver disease Low levels of magnesium or potassium in the blood Receiving electroconvulsive therapy Seizures Suicidal thoughts, plans, or attempt by you or a family member Take medications that treat or prevent blood clots Thyroid disease An unusual or allergic reaction to citalopram, other medications, foods, dyes, or preservatives Pregnant or trying to become pregnant Breastfeeding How should I use this medication? Take this medication by mouth with water. Take it as directed on the prescription label at the same time every day. You can take it with or without food. Do not take your medication more often than directed. Do not stop taking this medication suddenly except upon the advice of your care team. Stopping this medication too quickly may cause serious side effects or your condition may worsen. A special MedGuide will be given to you by the pharmacist with each prescription and refill. Be sure to read this information carefully each time. Talk to your care team about the use of this medication in children. Special care may be needed. People 60 years and older may have a stronger reaction and need a smaller dose. Overdosage: If you think you have taken too much of this medicine contact a poison control center or emergency room at once. NOTE: This medicine is  only for you. Do not share this medicine with others. What if I miss a dose? If you miss a dose, take it as soon as you can. If it is almost time for your next dose, take only that dose. Do not take double or extra doses. What may interact with this medication? Do not take this medication with any of the following: Certain medications for fungal infections, such as fluconazole, itraconazole, ketoconazole, posaconazole, voriconazole Cisapride Dronedarone Escitalopram Linezolid MAOIs, such as Carbex, Eldepryl, Marplan, Nardil, and Parnate Methylene blue (injected into a vein) Pimozide Thioridazine This medication may also interact with the following: Alcohol Amphetamines Aspirin and aspirin-like medications Carbamazepine Certain medications for infections, such as chloroquine, clarithromycin, erythromycin, furazolidone, isoniazid, pentamidine Certain medications for mental health conditions Certain medications for migraine headaches, such as almotriptan, eletriptan, frovatriptan, naratriptan, rizatriptan, sumatriptan, zolmitriptan Certain medications for sleep Certain medications that treat or prevent blood clots, such as dalteparin, enoxaparin, warfarin Cimetidine Diuretics Dofetilide Fentanyl Lithium Methadone Metoprolol NSAIDs, medications for pain and inflammation, such as ibuprofen or naproxen Omeprazole Other medications that cause heart rhythm changes Procarbazine Rasagiline Supplements, such as St. John's wort, kava kava, valerian Tramadol Tryptophan Ziprasidone This list may not describe all possible interactions. Give your health care provider a list of all the medicines, herbs, non-prescription drugs, or dietary supplements you use. Also tell them if you smoke, drink alcohol, or use illegal drugs. Some items may interact with your medicine. What should I watch for while using this medication? Tell your care team if your symptoms do not get better or if they get  worse. Visit your care team for regular checks on your  progress. Because it may take several weeks to see the full effects of this medication, it is important to continue your treatment as prescribed. This medication may cause thoughts of suicide or depression. This includes sudden changes in mood, behaviors, or thoughts. These changes can happen at any time but are more common in the beginning of treatment or after a change in dose. Call your care team right away if you experience these thoughts or worsening depression. This medication may cause mood and behavior changes, such as anxiety, nervousness, irritability, hostility, restlessness, excitability, hyperactivity, or trouble sleeping. These changes can happen at any time but are more common in the beginning of treatment or after a change in dose. Call your care team right away if you notice any of these symptoms. This medication may affect your coordination, reaction time, or judgment. Do not drive or operate machinery until you know how this medication affects you. Sit up or stand slowly to reduce the risk of dizzy or fainting spells. Drinking alcohol with this medication can increase the risk of these side effects. Your mouth may get dry. Chewing sugarless gum or sucking hard candy and drinking plenty of water may help. Contact your care team if the problem does not go away or is severe. What side effects may I notice from receiving this medication? Side effects that you should report to your care team as soon as possible: Allergic reactions--skin rash, itching, hives, swelling of the face, lips, tongue, or throat Bleeding--bloody or black, tar-like stools, red or dark brown urine, vomiting blood or brown material that looks like coffee grounds, small, red or purple spots on skin, unusual bleeding or bruising Heart rhythm changes--fast or irregular heartbeat, dizziness, feeling faint or lightheaded, chest pain, trouble breathing Low sodium  level--muscle weakness, fatigue, dizziness, headache, confusion Serotonin syndrome--irritability, confusion, fast or irregular heartbeat, muscle stiffness, twitching muscles, sweating, high fever, seizure, chills, vomiting, diarrhea Sudden eye pain or change in vision such as blurry vision, seeing halos around lights, vision loss Thoughts of suicide or self-harm, worsening mood, feelings of depression Side effects that usually do not require medical attention (report to your care team if they continue or are bothersome): Change in sex drive or performance Diarrhea Dry mouth Excessive sweating Nausea Tremors or shaking Upset stomach This list may not describe all possible side effects. Call your doctor for medical advice about side effects. You may report side effects to FDA at 1-800-FDA-1088. Where should I keep my medication? Keep out of reach of children and pets. Store at room temperature between 15 and 30 degrees C (59 and 86 degrees F). Throw away any unused medication after the expiration date. NOTE: This sheet is a summary. It may not cover all possible information. If you have questions about this medicine, talk to your doctor, pharmacist, or health care provider.  2024 Elsevier/Gold Standard (2022-09-02 00:00:00)

## 2024-11-01 ENCOUNTER — Ambulatory Visit: Attending: Nurse Practitioner | Admitting: Nurse Practitioner

## 2024-11-01 ENCOUNTER — Encounter: Payer: Self-pay | Admitting: Nurse Practitioner

## 2024-11-01 VITALS — BP 119/97 | HR 92 | Temp 97.2°F | Resp 16 | Ht 64.0 in | Wt 237.0 lb

## 2024-11-01 DIAGNOSIS — M47816 Spondylosis without myelopathy or radiculopathy, lumbar region: Secondary | ICD-10-CM | POA: Insufficient documentation

## 2024-11-01 DIAGNOSIS — M12812 Other specific arthropathies, not elsewhere classified, left shoulder: Secondary | ICD-10-CM | POA: Insufficient documentation

## 2024-11-01 DIAGNOSIS — G894 Chronic pain syndrome: Secondary | ICD-10-CM | POA: Insufficient documentation

## 2024-11-01 DIAGNOSIS — M797 Fibromyalgia: Secondary | ICD-10-CM | POA: Insufficient documentation

## 2024-11-01 DIAGNOSIS — M12811 Other specific arthropathies, not elsewhere classified, right shoulder: Secondary | ICD-10-CM | POA: Insufficient documentation

## 2024-11-01 DIAGNOSIS — M1711 Unilateral primary osteoarthritis, right knee: Secondary | ICD-10-CM | POA: Diagnosis present

## 2024-11-01 DIAGNOSIS — M5416 Radiculopathy, lumbar region: Secondary | ICD-10-CM | POA: Diagnosis present

## 2024-11-01 DIAGNOSIS — G8929 Other chronic pain: Secondary | ICD-10-CM | POA: Insufficient documentation

## 2024-11-01 MED ORDER — BUPRENORPHINE 5 MCG/HR TD PTWK: 1.0000 | MEDICATED_PATCH | TRANSDERMAL | 0 refills | Status: DC

## 2024-11-01 MED ORDER — TRAMADOL HCL 50 MG PO TABS: 50.0000 mg | ORAL_TABLET | Freq: Two times a day (BID) | ORAL | 0 refills | Status: AC | PRN

## 2024-11-01 NOTE — Progress Notes (Signed)
 Safety precautions to be maintained throughout the outpatient stay will include: orient to surroundings, keep bed in low position, maintain call bell within reach at all times, provide assistance with transfer out of bed and ambulation.

## 2024-11-01 NOTE — Progress Notes (Signed)
 PROVIDER NOTE: Interpretation of information contained herein should be left to medically-trained personnel. Specific patient instructions are provided elsewhere under Patient Instructions section of medical record. This document was created in part using AI and STT-dictation technology, any transcriptional errors that may result from this process are unintentional.  Patient: Lindsay Mcdonald  Service: E/M   PCP: Sowles, Krichna, MD  DOB: 1961-06-04  DOS: 11/01/2024  Provider: Emmy MARLA Blanch, NP  MRN: [id number]  Delivery: Face-to-face  Specialty: Interventional Pain Management  Type: Established Patient  Setting: Ambulatory outpatient facility  Specialty designation: 09  Referring Prov.: Sowles, Krichna, MD  Location: Outpatient office facility       History of present illness (HPI) [name], a 63 y.o. year old female, is here today because of her Primary osteoarthritis of right knee [M17.11]. [name] primary complain today is Leg Pain (R>L/From hips bilateral to outer leg to entire knees bilateral )  Pertinent problems: Lindsay Mcdonald Primary osteoarthritis of right knee; Depression, major, recurrent, mild (HCC); Hyperlipidemia; Benign hypertension; Morbid obesity with BMI of 40.0-44.9, adult (HCC); Vitamin D  deficiency; Muscle spasms of neck; Lumbar spondylosis; Chronic radicular lumbar pain; Rotator cuff arthropathy of both shoulders (left>right); and Chronic pain syndrome on their pertinent problem list.   Pain Assessment: Severity of Chronic pain is reported as a 7 /10. Location: Leg Right, Left (R>L)/From hips bilateral to outer leg to entire knees bilateral. Onset: More than a month ago. Quality: Aching. Timing: Constant. Modifying factor(s): Denies. Vitals:  height is 5' 4 (1.626 m) and weight is 237 lb (107.5 kg). Her temporal temperature is 97.2 F (36.2 C) (abnormal). Her blood pressure is 119/97 (abnormal) and her pulse is 92. Her respiration is 16 and oxygen saturation  is 98%.  BMI: Estimated body mass index is 40.68 kg/m as calculated from the following:   Height as of this encounter: 5' 4 (1.626 m).   Weight as of this encounter: 237 lb (107.5 kg).  Last encounter: Visit date not found. Last procedure: Visit date not found.  Reason for encounter: evaluation for possible interventional PM therapy/treatment and medication management.   Discussed the use of AI scribe software for clinical note transcription with the patient, who gave verbal consent to proceed.  History of Present Illness   Lindsay Mcdonald is a 63 year old female with arthritis and fibromyalgia who presents with right knee pain.   She experiences pain primarily in her right knee, which began last week. There is no radiation of pain from the lower back or hip area. She has a history of a fall on right knee many years ago. She reports a prior diagnosis of arthritis and joint space narrowing in both knees, and recalls having imaging performed in 2021 and 2025. She is currently on Tramadol 50 mg BID, however, she reports that tramadol does not alleviate her pain. We discussed to initiate trial with Butrnas 5 mcg patch as other option. Patient agree with the plan.   She has received steroid injections in the past for her knee pain, by orthopedics at the Clinic. She experienced extreme pain while driving on Saturday.  She also has a history of fibromyalgia, which causes pain in various areas. She is currently taking tramadol for pain management.     Pharmacotherapy Assessment   Tramadol 50 mg twice daily. MME=20 Butrans 5 mcg patch, once a week. Monitoring: [place] PMP: PDMP reviewed during this encounter.       Pharmacotherapy: No side-effects or adverse reactions reported. Compliance:  No problems identified. Effectiveness: Clinically acceptable.  Bonner Norris, RN  11/01/2024  9:10 AM  Sign when Signing Visit Safety precautions to be maintained throughout the outpatient stay will include:  orient to surroundings, keep bed in low position, maintain call bell within reach at all times, provide assistance with transfer out of bed and ambulation.     UDS:  Summary  Date Value Ref Range Status  10/20/2024 FINAL  Final    Comment:    ==================================================================== Compliance Drug Analysis, Ur ==================================================================== Test                             Result       Flag       Units  Drug Present and Declared for Prescription Verification   Baclofen                        PRESENT      EXPECTED   Mirtazapine                     PRESENT      EXPECTED   Vilazodone                      PRESENT      EXPECTED   Naproxen                        PRESENT      EXPECTED   Hydroxyzine                     PRESENT      EXPECTED  Drug Absent but Declared for Prescription Verification   Tramadol                       Not Detected UNEXPECTED ng/mg creat   Salicylate                     Not Detected UNEXPECTED    Aspirin, as indicated in the declared medication list, is not always    detected even when used as directed.    Ibuprofen                      Not Detected UNEXPECTED    Ibuprofen, as indicated in the declared medication list, is not    always detected even when used as directed.  ==================================================================== Test                      Result    Flag   Units      Ref Range   Creatinine              169              mg/dL      >=79 ==================================================================== Declared Medications:  The flagging and interpretation on this report are based on the  following declared medications.  Unexpected results may arise from  inaccuracies in the declared medications.   **Note: The testing scope of this panel includes these medications:   Baclofen  (Lioresal )  Hydroxyzine  (Vistaril )  Mirtazapine  (Remeron )  Naproxen  (Aleve )  Tramadol  (Ultram)  Vilazodone    **Note: The testing scope of this panel does not include small to  moderate amounts of these reported medications:   Aspirin  Ibuprofen (Advil)   **Note: The testing  scope of this panel does not include the  following reported medications:   Albuterol  (Airsupra )  Budesonide (Airsupra )  Estradiol  (Combipatch)  Fluticasone  (Flonase )  Glucagon  (Gvoke)  Icosapent (Vascepa)  Insulin  (Toujeo )  Meclizine (Antivert)  Melatonin  Metformin   Montelukast  (Singulair )  Norethindrone (Combipatch)  Olmesartan  (Benicar )  Tirzepatide  (Mounjaro ) ==================================================================== For clinical consultation, please call [phone]. ====================================================================     No results found for: CBDTHCR No results found for: D8THCCBX No results found for: D9THCCBX  ROS  Constitutional: Denies any fever or chills Gastrointestinal: No reported hemesis, hematochezia, vomiting, or acute GI distress Musculoskeletal: Bilateral knee pain (R>L) Neurological: No reported episodes of acute onset apraxia, aphasia, dysarthria, agnosia, amnesia, paralysis, loss of coordination, or loss of consciousness  Medication Review  Accu-Chek Softclix Lancets, Albuterol -Budesonide, FreeStyle Libre 3 Plus Sensor, Glucagon , Insulin  Pen Needle, Melatonin, Vilazodone  HCl, aspirin EC, baclofen , buprenorphine, citalopram, estradiol -norethindrone, fluticasone , hydrOXYzine , icosapent Ethyl, insulin  glargine (1 Unit Dial), meclizine, metFORMIN , mirtazapine , montelukast , olmesartan -hydrochlorothiazide , tirzepatide , and traMADol  History Review  Allergy: Lindsay Mcdonald is allergic to simvastatin, trazodone  hcl, zetia  [ezetimibe ], atorvastatin, compazine , and penicillins. Drug: Lindsay Mcdonald  reports no history of drug use. Alcohol:  reports no history of alcohol use. Tobacco:  reports that she has never smoked. She has never  used smokeless tobacco. Social: Lindsay Mcdonald  reports that she has never smoked. She has never used smokeless tobacco. She reports that she does not drink alcohol and does not use drugs. Medical:  has a past medical history of Anxiety, Arthritis (03/2020), Chronic pain of right knee, Chronic sinusitis, DDD (degenerative disc disease), cervical, Depression, Diabetes mellitus, Fibromyalgia, GERD (gastroesophageal reflux disease), High cholesterol, Hyperlipidemia, Hypertension, Insomnia, Muscle pain, Nasal polyp, Neuromuscular disorder (HCC), Obesity, Palpitations, Tendonitis of knee, left (04/2020), Vitamin D  deficiency, and Vitamin D  deficiency. 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 Family: family history includes Alcohol abuse in her daughter and daughter; Breast cancer (age of onset: 33) in her maternal aunt; Breast cancer (age of onset: 71) in her mother; Cancer (age of onset: 55) in her half-sister; Cancer (age of onset: 11) in her mother; Dementia in her mother; Diabetes in her daughter, father, and mother; Gout in her mother; Kidney cancer in her maternal aunt; Kidney disease in her maternal aunt; Stroke in her maternal uncle.  Laboratory Chemistry Profile   Renal Lab Results  Component Value Date   BUN 21 06/17/2024   CREATININE 0.93 06/17/2024   LABCREA 164 05/27/2024   BCR 23 06/17/2024   GFRAA 93 02/22/2021   GFRNONAA >60 02/14/2024    Hepatic Lab Results  Component Value Date   AST 22 05/27/2024   ALT 19 05/27/2024   ALBUMIN 3.2 (L) 02/14/2024    ALKPHOS 66 02/14/2024   LIPASE 49 02/14/2024    Electrolytes Lab Results  Component Value Date   NA 142 06/17/2024   K 4.3 06/17/2024   CL 104 06/17/2024   CALCIUM  9.3 06/17/2024   MG 1.9 08/13/2022    Bone Lab Results  Component Value Date   VD25OH 22 (L) 06/27/2016    Inflammation (CRP: Acute Phase) (ESR: Chronic Phase) Lab Results  Component Value Date   ESRSEDRATE 2 07/20/2012  Note: Above Lab results reviewed.  Recent Imaging Review  DG PAIN CLINIC C-ARM 1-60 MIN NO REPORT Fluoro was used, but no Radiologist interpretation will be provided.  Please refer to NOTES tab for provider progress note. Note: Reviewed        Narrative  EXAM: Right Knee Radiographs - 3 views (Bilateral AP, Lateral, Bilateral Sunrise) performed 01/29/2024  CLINICAL INFORMATION: Chronic right knee pain  COMPARISON: Right Knee Radiographs - 3 views (Bilateral AP, Lateral, Bilateral Sunrise) performed 01/05/2020  FINDINGS:   Stable appearance to moderate tricompartmental arthritis change.  Valgus tibiofemoral alignment.  Normal patella for alignment.  No fractures or dislocations.  No soft tissue swelling or joint effusion.  Moderate tricompartmental arthritis change worse in the lateral and patellofemoral compartment where there is joint space narrowing, osteophyte formation, subchondral sclerosis.  No loose bodies.  No abnormal bone lesions. Exam End: 01/29/24 09:53 Last Resulted: 01/29/24 09:59  Received From: [facility]  Result Received: 03/22/24 09:27     Physical Exam  Vitals: BP (!) 119/97 (Patient Position: Sitting, Cuff Size: Normal)   Pulse 92   Temp (!) 97.2 F (36.2 C) (Temporal)   Resp 16   Ht 5' 4 (1.626 m)   Wt 237 lb (107.5 kg)   SpO2 98%   BMI 40.68 kg/m  BMI: Estimated body mass index is 40.68 kg/m as calculated from the following:   Height as of this encounter: 5' 4 (1.626 m).   Weight as of this encounter: 237 lb (107.5  kg). Ideal: Ideal body weight: 54.7 kg (120 lb 9.5 oz) Adjusted ideal body weight: 75.8 kg (167 lb 2.5 oz) General appearance: Well nourished, well developed, and well hydrated. In no apparent acute distress Mental status: Alert, oriented x 3 (person, place, & time)       Respiratory: No evidence of acute respiratory distress Eyes: PERLA  Musculoskeletal: + Right knee pain worse with weightbearing, prolonged standing, and walking Assessment   Diagnosis Status  1. Primary osteoarthritis of right knee   2. Lumbar facet arthropathy   3. Lumbar spondylosis   4. Chronic radicular lumbar pain (right L5/S1 disc herniation)   5. Rotator cuff arthropathy of both shoulders (left>right)   6. Fibromyalgia   7. Chronic pain syndrome    Persistent Controlled Controlled   Updated Problems: No problems updated.  Plan of Care  Problem-specific:  Assessment and Plan    Primary osteoarthritis of the right knee Chronic right knee pain due to primary osteoarthritis with moderate tricompartmental arthritis changes. Previous steroid injections. Current pain is significant, especially when driving. - Scheduled right knee steroid injection without sedation. Right knee x-ray revealed Moderate tricompartmental arthritis change worse in the lateral and patellofemoral compartment where there is joint space narrowing, osteophyte formation.  Osteoarthritis of bilateral knees Bilateral knee osteoarthritis with moderate tricompartmental arthritis changes. Right knee is more symptomatic. - Scheduled right knee steroid injection.  Fibromyalgia Chronic pain in multiple areas consistent with fibromyalgia. Current management includes tramadol, but considering transition to Butrans patch for better pain control and to avoid oral medication side effects. Discussed potential side effects of Butrans patch, including nausea, irritation, and itching. Emphasized the importance of not taking tramadol concurrently with  the patch to assess efficacy. - Initiated Butrans patch at 5 mcg/hour for 7 days, alternating sites weekly. - Discontinued tramadol during Butrans patch trial. - Monitor for side effects and efficacy of Butrans patch. - Sent prescription for Butrans patch and tramadol to pharmacy.  Prescribing drug  monitoring (PDMP) reviewed, findings consistent with the use of prescribed medication.  Urine drug screening (UDS) up to date and consistent with the use of prescribed medication.  Schedule follow-up in 30 days for medication management.  Plan: (Clinic): (R) Knee injection # 1 with Dr. Marcelino        Lindsay Mcdonald has a current medication list which includes the following long-term medication(s): citalopram, estradiol -norethindrone, icosapent ethyl, toujeo  solostar, metformin , mirtazapine , montelukast , vilazodone  hcl, and olmesartan -hydrochlorothiazide .  Pharmacotherapy (Medications Ordered): Meds ordered this encounter  Medications   traMADol (ULTRAM) 50 MG tablet    Sig: Take 1 tablet (50 mg total) by mouth every 12 (twelve) hours as needed for severe pain (pain score 7-10).    Dispense:  30 tablet    Refill:  0    Fill one day early if pharmacy is closed on scheduled refill date. Do not fill until: To last until:   buprenorphine (BUTRANS) 5 MCG/HR PTWK    Sig: Place 1 patch onto the skin once a week for 28 days.    Dispense:  4 patch    Refill:  0   Orders:  Orders Placed This Encounter  Procedures   KNEE INJECTION    Local Anesthetic & Steroid injection.    Standing Status:   Future    Expiration Date:   02/01/2025    Scheduling Instructions:     Procedure: Intra-articular steroid knee injection     Side: Right-sided     Sedation: None     Timeframe: As soon as schedule allows    Where will this procedure be performed?:   [facility]      Return in about 2 weeks (around 11/15/2024) for (Clinic): (R) Knee injection # 1 with Dr. Marcelino .    Recent Visits Date  Type Provider Dept  10/20/24 Office Visit Marcelino Nurse, MD [facility]  08/10/24 Procedure visit Marcelino Nurse, MD [facility]  Showing recent visits within past 90 days and meeting all other requirements Today's Visits Date Type Provider Dept  11/01/24 Office Visit Octavis Sheeler K, NP [facility]  Showing today's visits and meeting all other requirements Future Appointments Date Type Provider Dept  12/01/24 Appointment Junetta Hearn K, NP [facility]  Showing future appointments within next 90 days and meeting all other requirements  I discussed the assessment and treatment plan with the patient. The patient was provided an opportunity to ask questions and all were answered. The patient agreed with the plan and demonstrated an understanding of the instructions.  Patient advised to call back or seek an in-person evaluation if the symptoms or condition worsens.   Note by: Gunner Iodice K Danel Requena, NP (TTS and AI technology used. I apologize for any typographical errors that were not detected and corrected.) Date: 11/01/2024; Time: 10:28 AM

## 2024-11-01 NOTE — Patient Instructions (Signed)

## 2024-11-03 ENCOUNTER — Encounter: Payer: Self-pay | Admitting: Family Medicine

## 2024-11-05 ENCOUNTER — Encounter: Payer: Self-pay | Admitting: Family Medicine

## 2024-11-07 ENCOUNTER — Telehealth: Payer: Self-pay | Admitting: Nurse Practitioner

## 2024-11-07 ENCOUNTER — Ambulatory Visit: Admitting: Professional Counselor

## 2024-11-07 ENCOUNTER — Telehealth: Payer: Self-pay

## 2024-11-07 ENCOUNTER — Other Ambulatory Visit: Payer: Self-pay

## 2024-11-07 DIAGNOSIS — F331 Major depressive disorder, recurrent, moderate: Secondary | ICD-10-CM | POA: Diagnosis not present

## 2024-11-07 DIAGNOSIS — E1169 Type 2 diabetes mellitus with other specified complication: Secondary | ICD-10-CM

## 2024-11-07 DIAGNOSIS — F411 Generalized anxiety disorder: Secondary | ICD-10-CM

## 2024-11-07 MED ORDER — METFORMIN HCL ER 750 MG PO TB24: 1500.0000 mg | ORAL_TABLET | Freq: Every day | ORAL | 0 refills | Status: DC

## 2024-11-07 NOTE — Telephone Encounter (Signed)
 Needs PA for patches

## 2024-11-07 NOTE — Telephone Encounter (Signed)
 PA Submitted for [employer]

## 2024-11-07 NOTE — Progress Notes (Signed)
 THERAPIST PROGRESS NOTE  Virtual Visit via Video Note  I connected with Lindsay Mcdonald on 11/07/24 at  8:00 AM EST by a video enabled telemedicine application and verified that I am speaking with the correct person using two identifiers.  Location: Patient: Home Provider: Remote office   I discussed the limitations of evaluation and management by telemedicine and the availability of in person appointments. The patient expressed understanding and agreed to proceed.   I discussed the assessment and treatment plan with the patient. The patient was provided an opportunity to ask questions and all were answered. The patient agreed with the plan and demonstrated an understanding of the instructions.   The patient was advised to call back or seek an in-person evaluation if the symptoms worsen or if the condition fails to improve as anticipated.  I provided 56 minutes of non-face-to-face time during this encounter. Lindsay Mcdonald, Lindsay Mcdonald  Session Time: 8:02 AM - 8:58 AM   Participation Level: Active  Behavioral Response: Casual, Alert, Anxious and Dysphoric  Type of Therapy: Individual Therapy  Treatment Goals addressed: Active OP Depression  LTG: I'd like to be able to make decisions and have clarity and feel good about my decisions. I'd like to also feel better with relationships. How to manage stress.  (Progressing)                Start:  05/25/24    Expected End:  05/24/25    Goal Note Reviewed 09/06/24 Still need, not where I want to be, just trying to put it all together.   STG: Stress management. To improve stress management AEB reduction in mood symptoms by practicing coping skills over the next 12 weeks (Progressing)  Goal Note Reviewed 09/06/24 I think a little better. I don't know if it's due to the medication and overeating, but that's not coping, but still working on it. Some things you mentioned today I can try to implement.   STG: Self-care, self-love, I'm kind  of hard on myself. To improve sense of self AEB self-report in increase of self-care activities and restructuring maladaptive patterns of thinking over the next 90 days.  (Progressing)  Goal Note Reviewed 09/06/24 Like we discussed today, the acceptance part is the key, if I can get there.   STG: I just want to be able to control that more. (Bringing up graphic images like traumatic things seen on TV). To reduce distress brought on by OCD tendencies AEB self-report reduction in obsessive and compulsive behaviors with exposure and CBT.  (Progressing)  Goal Note Reviewed 09/06/24 A little bit better. I think when you're busy that helps distract some, but when you're by yourself, that still needs assistance.  ProgressTowards Goals: Not Progressing  Interventions: CBT, Motivational Interviewing, Solution Focused, and Supportive  Summary: Lindsay Mcdonald is a 63 y.o. female who presents with a history of anxiety and depression. She appeared somber but oriented x5. She reported she hasn't been feeling well, physical health issues. Lindsay Mcdonald reported she was able to get her mother moved to a new facility. Although she's glad about this, she noted it has contributed to her stress and pain. She engaged in discussion about FMLA and ongoing health issues. [name] stated she struggles with change and seems to prefer to stay with her current employer. She shared concerns about making changes to medications or trying TMS, although she actively listened to more information on this treatment. 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 She explored possible solutions but feels stuck at the moment.  Therapist Response: Conducted session with Lindsay. Began session with check-in/update since previous session. Utilized empathetic and reflective listening. Used open-ended questions to facilitate discussion and summarized Lindsay Mcdonald's thoughts/feelings. Reminded  Lindsay Mcdonald of the cycle of anxiety/avoidance trap. Encouraged her to consider remote work due to commuting issues and to speak with her PCP or pain management doctor about FMLA. Provided information on TMS and reframed negative/anxious thoughts around medications/treatments. Explored housing situation and possible solutions to this stressor, like ear plugs, noise canceling headphones, and [employer] for tenant rights. Scheduled additional appointment and concluded session.   Suicidal/Homicidal: No  Plan: Return again in 1 week.  Diagnosis: Generalized anxiety disorder  Major depressive disorder, recurrent episode, moderate (HCC)  Collaboration of Care: Medication Management AEB chart review, staff case with Dr. Coby  Patient/Guardian was advised Release of Information must be obtained prior to any record release in order to collaborate their care with an outside provider. Patient/Guardian was advised if they have not already done so to contact the registration department to sign all necessary forms in order for us  to release information regarding their care.   Consent: Patient/Guardian gives verbal consent for treatment and assignment of benefits for services provided during this visit. Patient/Guardian expressed understanding and agreed to proceed.   Lindsay Mcdonald, Lindsay Mcdonald 11/07/2024

## 2024-11-09 ENCOUNTER — Encounter: Payer: Self-pay | Admitting: Student in an Organized Health Care Education/Training Program

## 2024-11-09 ENCOUNTER — Encounter: Payer: Self-pay | Admitting: Family Medicine

## 2024-11-09 ENCOUNTER — Other Ambulatory Visit: Payer: Self-pay | Admitting: Family Medicine

## 2024-11-09 ENCOUNTER — Ambulatory Visit: Attending: Nurse Practitioner | Admitting: Student in an Organized Health Care Education/Training Program

## 2024-11-09 DIAGNOSIS — R051 Acute cough: Secondary | ICD-10-CM

## 2024-11-09 DIAGNOSIS — M1711 Unilateral primary osteoarthritis, right knee: Secondary | ICD-10-CM | POA: Insufficient documentation

## 2024-11-09 MED ORDER — METHYLPREDNISOLONE ACETATE 40 MG/ML IJ SUSP
INTRAMUSCULAR | Status: AC
  Filled 2024-11-09: qty 1

## 2024-11-09 MED ORDER — ROPIVACAINE HCL 2 MG/ML IJ SOLN
4.0000 mL | Freq: Once | INTRAMUSCULAR | Status: AC
  Administered 2024-11-09: 4 mL via INTRA_ARTICULAR

## 2024-11-09 MED ORDER — ROPIVACAINE HCL 2 MG/ML IJ SOLN
INTRAMUSCULAR | Status: AC
  Filled 2024-11-09: qty 20

## 2024-11-09 MED ORDER — METHYLPREDNISOLONE ACETATE 40 MG/ML IJ SUSP
40.0000 mg | Freq: Once | INTRAMUSCULAR | Status: AC
  Administered 2024-11-09: 40 mg via INTRA_ARTICULAR

## 2024-11-09 MED ORDER — LIDOCAINE HCL (PF) 2 % IJ SOLN
INTRAMUSCULAR | Status: AC
  Filled 2024-11-09: qty 10

## 2024-11-09 MED ORDER — LIDOCAINE HCL 2 % IJ SOLN
20.0000 mL | Freq: Once | INTRAMUSCULAR | Status: AC
  Administered 2024-11-09: 200 mg

## 2024-11-09 NOTE — Progress Notes (Signed)
 PROVIDER NOTE: Interpretation of information contained herein should be left to medically-trained personnel. Specific patient instructions are provided elsewhere under Patient Instructions section of medical record. This document was created in part using STT-dictation technology, any transcriptional errors that may result from this process are unintentional.  Patient: Lindsay Mcdonald Type: Established DOB: 03/24/1961 MRN: [id number] PCP: Sowles, Krichna, MD  Service: Procedure DOS: 11/09/2024 Setting: Ambulatory Location: Ambulatory outpatient facility Delivery: Face-to-face Provider: Wallie Sherry, MD Specialty: Interventional Pain Management Specialty designation: 09 Location: Outpatient facility Ref. Prov.: Patel, Seema K, NP       Interventional Therapy   Type:  Steroid Intra-articular Knee Injection #1  Laterality: Right (-RT) Level/approach: Medial Imaging guidance: None required ([id number]) Anesthesia: Local anesthesia (1-2% Lidocaine ) DOS: 11/09/2024  Performed by: Wallie Sherry, MD  Purpose: Diagnostic/Therapeutic Indications: Knee arthralgia associated to osteoarthritis of the knee 1. Primary osteoarthritis of right knee    NAS-11 score:   Pre-procedure: 4 /10   Post-procedure: 4 /10     Pre-Procedure Preparation  Monitoring: As per clinic protocol.  Risk Assessment: Vitals:  AFP:Zdupfjuzi body mass index is 40.17 kg/m as calculated from the following:   Height as of this encounter: 5' 4 (1.626 m).   Weight as of this encounter: 234 lb (106.1 kg)., Rate:90 , BP:126/63, Resp:16, Temp:97.7 F (36.5 C), SpO2:99 %  Allergies: She is allergic to simvastatin, trazodone  hcl, zetia  [ezetimibe ], atorvastatin, compazine , and penicillins.  Precautions: No additional precautions required  Blood-thinner(s): None at this time  Coagulopathies: Reviewed. None identified.   Active Infection(s): Reviewed. None identified. Lindsay Mcdonald is afebrile   Location setting: Exam  room Position: Sitting w/ knee bent 90 degrees Safety Precautions: Patient was assessed for positional comfort and pressure points before starting the procedure. Prepping solution: DuraPrep (Iodine Povacrylex [0.7% available iodine] and Isopropyl Alcohol, 74% w/w) Prep Area: Entire knee region Approach: percutaneous, just above the tibial plateau, lateral to the infrapatellar tendon. Intended target: Intra-articular knee space Materials: Tray: Block Needle(s): Regular Qty: 1/side Length: 1.5-inch Gauge: 25G (x1) + 22G (x1)  Meds ordered this encounter  Medications   lidocaine  (XYLOCAINE ) 2 % (with pres) injection 400 mg   methylPREDNISolone  acetate (DEPO-MEDROL ) injection 40 mg   ropivacaine  (PF) 2 mg/mL (0.2%) (NAROPIN ) injection 4 mL    No orders of the defined types were placed in this encounter.    Time-out: 1455 I initiated and conducted the Time-out before starting the procedure, as per protocol. The patient was asked to participate by confirming the accuracy of the Time Out information. Verification of the correct person, site, and procedure were performed and confirmed by me, the nursing staff, and the patient. Time-out conducted as per Joint Commission's Universal Protocol (UP.01.01.01). Procedure checklist: Completed  H&P (Pre-op  Assessment)  Lindsay Mcdonald is a 63 y.o. (year old), female patient, seen today for interventional treatment. 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 Lindsay Mcdonald has a current medication list which includes the following  prescription(s): accu-chek softclix lancets, airsupra , aspirin ec, baclofen , buprenorphine, citalopram, freestyle libre 3 plus sensor, estradiol -norethindrone, fluticasone , gvoke hypopen  2-pack, hydroxyzine , icosapent ethyl, toujeo  solostar, insulin  pen needle, meclizine, melatonin, metformin , mirtazapine , montelukast , olmesartan -hydrochlorothiazide , tirzepatide , tramadol, and vilazodone  hcl. Her primarily concern today is the Knee  Pain (R>L)  She is allergic to simvastatin, trazodone  hcl, zetia  [ezetimibe ], atorvastatin, compazine , and penicillins.   Last encounter: My last encounter with her was on 10/20/2024. Pertinent problems: Lindsay Mcdonald does not have any pertinent problems on file. Pain Assessment: Severity of Chronic pain is reported as a 4 /10. Location: Knee Right, Left (R>L)/From hips bilateral to outer leg to entire knees bilateral. Onset: More than a month ago. Quality: Aching, Constant. Timing: Constant. Modifying factor(s): Denies. Vitals:  height is 5' 4 (1.626 m) and weight is 234 lb (106.1 kg). Her temporal temperature is 97.7 F (36.5 C). Her blood pressure is 126/63 and her pulse is 90. Her respiration is 16 and oxygen saturation is 99%.   Reason for encounter: interventional pain management therapy due pain of at least four (4) weeks in duration, with failure to respond and/or inability to tolerate more conservative care.  Site Confirmation: Lindsay Mcdonald was asked to confirm the procedure and laterality before marking the site.  Consent: Before the procedure and under the influence of no sedative(s), amnesic(s), or anxiolytics, the patient was informed of the treatment options, risks and possible complications. To fulfill our ethical and legal obligations, as recommended by the American Medical Association's Code of Ethics, I have informed the patient of my clinical impression; the nature and purpose of the treatment or procedure; the risks, benefits, and possible  complications of the intervention; the alternatives, including doing nothing; the risk(s) and benefit(s) of the alternative treatment(s) or procedure(s); and the risk(s) and benefit(s) of doing nothing. The patient was provided information about the general risks and possible complications associated with the procedure. These may include, but are not limited to: failure to achieve desired goals, infection, bleeding, organ or nerve damage, allergic reactions, paralysis, and death. In addition, the patient was informed of those risks and complications associated to Spine-related procedures, such as failure to decrease pain; infection (i.e.: Meningitis, epidural or intraspinal abscess); bleeding (i.e.: epidural hematoma, subarachnoid hemorrhage, or any other type of intraspinal or peri-dural bleeding); organ or nerve damage (i.e.: Any type of peripheral nerve, nerve root, or spinal cord injury) with subsequent damage to sensory, motor, and/or autonomic systems, resulting in permanent pain, numbness, and/or weakness of one or several areas of the body; allergic reactions; (i.e.: anaphylactic reaction); and/or death. Furthermore, the patient was informed of those risks and complications associated with the medications. These include, but are not limited to: allergic reactions (i.e.: anaphylactic or anaphylactoid reaction(s)); adrenal axis suppression; blood sugar elevation that in diabetics may result in ketoacidosis or comma; water  retention that in patients with history of congestive heart failure may result in shortness of breath, pulmonary edema, and decompensation with resultant heart failure; weight gain; swelling or edema; medication-induced neural toxicity; particulate matter embolism and blood vessel occlusion with resultant organ, and/or nervous system infarction; and/or aseptic necrosis of one or more joints. Finally, the patient was informed that Medicine is not an exact science; therefore, there is also  the possibility of unforeseen or unpredictable risks and/or possible complications that may result in a catastrophic outcome. The patient indicated having understood very clearly. We have given the patient no guarantees and we have made no promises. Enough time was given to the patient to ask questions, all of which were answered to the patient's satisfaction. Ms. Pacifico has indicated that she wanted to continue with the procedure. Attestation: I, the ordering provider, attest that I have discussed with the patient the benefits, risks, side-effects, alternatives, likelihood of achieving goals, and  potential problems during recovery for the procedure that I have provided informed consent.  Date  Time: 11/09/2024  2:04 PM  Description of procedure  Start Time: 1455 hrs  Local Anesthesia: Once the patient was positioned, prepped, and time-out was completed. The target area was identified located. The skin was marked with an approved surgical skin marker. Once marked, the skin (epidermis, dermis, and hypodermis), and deeper tissues (fat, connective tissue and muscle) were infiltrated with a small amount of a short-acting local anesthetic, loaded on a 10cc syringe with a 25G, 1.5-in  Needle. An appropriate amount of time was allowed for local anesthetics to take effect before proceeding to the next step. Local Anesthetic: Lidocaine  1-2% The unused portion of the local anesthetic was discarded in the proper designated containers. Safety Precautions: Aspiration looking for blood return was conducted prior to all injections. At no point did I inject any substances, as a needle was being advanced. Before injecting, the patient was told to immediately notify me if she was experiencing any new onset of ringing in the ears, or metallic taste in the mouth. No attempts were made at seeking any paresthesias. Safe injection practices and needle disposal techniques used. Medications properly checked for expiration  dates. SDV (single dose vial) medications used. After the completion of the procedure, all disposable equipment used was discarded in the proper designated medical waste containers.  Technical description: Protocol guidelines were followed. After positioning, the target area was identified and prepped in the usual manner. Skin & deeper tissues infiltrated with local anesthetic. Appropriate amount of time allowed to pass for local anesthetics to take effect. Proper needle placement secured. Once satisfactory needle placement was confirmed, I proceeded to inject the desired solution in slow, incremental fashion, intermittently assessing for discomfort or any signs of abnormal or undesired spread of substance. Once completed, the needle was removed and disposed of, as per hospital protocols. The area was cleaned, making sure to leave some of the prepping solution back to take advantage of its long term bactericidal properties.  Aspiration:  Negative        Vitals:   11/09/24 1407  BP: 126/63  Pulse: 90  Resp: 16  Temp: 97.7 F (36.5 C)  TempSrc: Temporal  SpO2: 99%  Weight: 234 lb (106.1 kg)  Height: 5' 4 (1.626 m)    End Time: 1457 hrs  Imaging guidance  Imaging-assisted Technique: None required. Indication(s): N/A Exposure Time: N/A Contrast: None Fluoroscopic Guidance: N/A Ultrasound Guidance: N/A Interpretation: N/A  Post-op assessment  Post-procedure Vital Signs:  Pulse/HCG Rate: 90  Temp: 97.7 F (36.5 C) Resp: 16 BP: 126/63 SpO2: 99 %  EBL: None  Complications: No immediate post-treatment complications observed by team, or reported by patient.  Note: The patient tolerated the entire procedure well. A repeat set of vitals were taken after the procedure and the patient was kept under observation following institutional policy, for this type of procedure. Post-procedural neurological assessment was performed, showing return to baseline, prior to discharge. The  patient was provided with post-procedure discharge instructions, including a section on how to identify potential problems. Should any problems arise concerning this procedure, the patient was given instructions to immediately contact us , at any time, without hesitation. In any case, we plan to contact the patient by telephone for a follow-up status report regarding this interventional procedure.  Comments:  No additional relevant information.  Plan of care   Medications administered: We administered lidocaine , methylPREDNISolone  acetate, and ropivacaine  (PF) 2 mg/mL (0.2%).  Bilateral L3, L4, L5 medial branch nerve block 08/10/2024- not effective; right knee steroid injection 11/09/2024   Follow-up plan:   Return for Keep sch. appt.     Recent Visits Date Type Provider Dept  11/01/24 Office Visit Patel, Seema K, NP [facility]  10/20/24 Office Visit Marcelino Nurse, MD [facility]  Showing recent visits within past 90 days and meeting all other requirements Today's Visits Date Type Provider Dept  11/09/24 Procedure visit Marcelino Nurse, MD [facility]  Showing today's visits and meeting all other requirements Future Appointments Date Type Provider Dept  12/01/24 Appointment Patel, Seema K, NP [facility]  Showing future appointments within next 90 days and meeting all other requirements   Disposition: Discharge home  Discharge (Date  Time): 11/09/2024; 1458 hrs.   Primary Care Physician: Sowles, Krichna, MD Location[facility] Note by: Nurse Marcelino, MD Date: 11/09/2024; Time: 3:16 PM  DISCLAIMER: Medicine is not an exact science. It has no guarantees or warranties. The decision to proceed with this intervention was based on the information collected from the patient. Conclusions were drawn from the patient's questionnaire, interview, and examination. Because information was provided in large part by the  patient, it cannot be guaranteed that it has not been purposely or unconsciously manipulated or altered. Every effort has been made to obtain as much accurate, relevant, available data as possible. Always take into account that the treatment will also be dependent on availability of resources and existing treatment guidelines, considered by other Pain Management Specialists as being common knowledge and practice, at the time of the intervention. It is also important to point out that variation in procedural techniques and pharmacological choices are the acceptable norm. For Medico-Legal review purposes, the indications, contraindications, technique, and results of the these procedures should only be evaluated, judged and interpreted by a Board-Certified Interventional Pain Specialist with extensive familiarity and expertise in the same exact procedure and technique.

## 2024-11-10 ENCOUNTER — Telehealth: Payer: Self-pay | Admitting: *Deleted

## 2024-11-10 NOTE — Telephone Encounter (Signed)
Post procedure call 

## 2024-11-10 NOTE — Telephone Encounter (Signed)
 Post procedure call; voicemail left

## 2024-11-11 ENCOUNTER — Other Ambulatory Visit: Payer: Self-pay | Admitting: Psychiatry

## 2024-11-11 ENCOUNTER — Telehealth: Admitting: Psychiatry

## 2024-11-11 DIAGNOSIS — F331 Major depressive disorder, recurrent, moderate: Secondary | ICD-10-CM

## 2024-11-11 NOTE — Telephone Encounter (Signed)
 Requested Prescriptions  Refused Prescriptions Disp Refills   benzonatate  (TESSALON ) 100 MG capsule [Pharmacy Med Name: BENZONATATE  100MG  CAPSULE] 40 capsule 0    Sig: TAKE ONE (1) TO TWO (2) CAPSULES (100-200 MG TOTAL) BY MOUTH 3 TIMES DAILY AS NEEDED FOR COUGH.     Ear, Nose, and Throat:  Antitussives/Expectorants Passed - 11/11/2024  4:13 PM      Passed - Valid encounter within last 12 months    Recent Outpatient Visits           4 weeks ago Dyslipidemia associated with type 2 diabetes mellitus [facility])   [place] [facility] [place], Dorette, MD   3 months ago Dyslipidemia associated with type 2 diabetes mellitus [facility])   [place] [facility] [place], Dorette, MD   5 months ago Dyslipidemia associated with type 2 diabetes mellitus [facility] [place])   [place] [facility] [place] Dorette, MD   6 months ago Diabetic peripheral neuropathy associated with type 2 diabetes mellitus [facility])   [place] [facility] Sowles, Krichna, MD   7 months ago Obesity, diabetes, and hypertension syndrome [facility])   [place] [facility] Sowles, Krichna, MD

## 2024-11-14 ENCOUNTER — Encounter: Payer: Self-pay | Admitting: Family Medicine

## 2024-11-14 ENCOUNTER — Ambulatory Visit (INDEPENDENT_AMBULATORY_CARE_PROVIDER_SITE_OTHER): Admitting: Professional Counselor

## 2024-11-14 ENCOUNTER — Telehealth: Admitting: Psychiatry

## 2024-11-14 DIAGNOSIS — F411 Generalized anxiety disorder: Secondary | ICD-10-CM | POA: Diagnosis not present

## 2024-11-14 DIAGNOSIS — F331 Major depressive disorder, recurrent, moderate: Secondary | ICD-10-CM

## 2024-11-14 DIAGNOSIS — E1169 Type 2 diabetes mellitus with other specified complication: Secondary | ICD-10-CM

## 2024-11-14 MED ORDER — INSULIN PEN NEEDLE 32G X 6 MM MISC: 1.0000 | Freq: Every day | 1 refills | Status: AC

## 2024-11-14 NOTE — Progress Notes (Unsigned)
 THERAPIST PROGRESS NOTE  Virtual Visit via Video Note  I connected with Lindsay Mcdonald on 11/15/24 at 10:00 AM EST by a video enabled telemedicine application and verified that I am speaking with the correct person using two identifiers.  Location: Patient: Home Provider: Remote office   I discussed the limitations of evaluation and management by telemedicine and the availability of in person appointments. The patient expressed understanding and agreed to proceed.   I discussed the assessment and treatment plan with the patient. The patient was provided an opportunity to ask questions and all were answered. The patient agreed with the plan and demonstrated an understanding of the instructions.   The patient was advised to call back or seek an in-person evaluation if the symptoms worsen or if the condition fails to improve as anticipated.  I provided 60 minutes of non-face-to-face time during this encounter. Lindsay Mcdonald, [facility]  Session Time: 10:01 AM - 11:01 AM   Participation Level: Active  Behavioral Response: Casual, Alert, Anxious and Dysphoric  Type of Therapy: Individual Therapy  Treatment Goals addressed:  Active OP Depression  LTG: I'd like to be able to make decisions and have clarity and feel good about my decisions. I'd like to also feel better with relationships. How to manage stress.  (Progressing)                Start:  05/25/24    Expected End:  05/24/25    Goal Note Reviewed 09/06/24 Still need, not where I want to be, just trying to put it all together.   STG: Stress management. To improve stress management AEB reduction in mood symptoms by practicing coping skills over the next 12 weeks (Progressing)  Goal Note Reviewed 09/06/24 I think a little better. I don't know if it's due to the medication and overeating, but that's not coping, but still working on it. Some things you mentioned today I can try to implement.   STG: Self-care, self-love, I'm  kind of hard on myself. To improve sense of self AEB self-report in increase of self-care activities and restructuring maladaptive patterns of thinking over the next 90 days.  (Progressing)  Goal Note Reviewed 09/06/24 Like we discussed today, the acceptance part is the key, if I can get there.   STG: I just want to be able to control that more. (Bringing up graphic images like traumatic things seen on TV). To reduce distress brought on by OCD tendencies AEB self-report reduction in obsessive and compulsive behaviors with exposure and CBT.  (Progressing)  Goal Note Reviewed 09/06/24 A little bit better. I think when you're busy that helps distract some, but when you're by yourself, that still needs assistance.  ProgressTowards Goals: Progressing  Interventions: CBT, Motivational Interviewing, and Supportive  Summary: Lindsay Mcdonald is a 63 y.o. female who presents with a history of anxiety and depression. She reported her mother was hospitalized but is now back at the nursing home. She is happy they seem to be taking better care of her. She continues to struggle with her mother's decline though. Lindsay Mcdonald also struggles with her own aging. She appeared receptive to focusing more on intentional things she can do consistently to improve mood. She also noted how her thinking contributes to her mood/behavior. Lindsay expressed feeling stuck in her home, although she noted some improvement with her neighbor. She is resistant to changing thoughts around this situation.   Therapist Response: Conducted session with Lindsay. Began session with check-in/update since previous session. Utilized empathetic  and reflective listening. Used Socratic questioning to help challenge negative thoughts. Explored habits to practice to improve mood on a regular basis. Used gentle confrontation to highlight how Lindsay Mcdonald's mind reading/fortune telling is contributing to her negative emotions. Scheduled additional appointment and concluded  session.   Suicidal/Homicidal: No  Plan: Return again in 2 weeks.  Diagnosis: Generalized anxiety disorder  Major depressive disorder, recurrent episode, moderate (HCC)  Collaboration of Care: Medication Management AEB chart review  Patient/Guardian was advised Release of Information must be obtained prior to any record release in order to collaborate their care with an outside provider. Patient/Guardian was advised if they have not already done so to contact the registration department to sign all necessary forms in order for us  to release information regarding their care.   Consent: Patient/Guardian gives verbal consent for treatment and assignment of benefits for services provided during this visit. Patient/Guardian expressed understanding and agreed to proceed.   Lindsay Mcdonald, [facility] 11/15/2024

## 2024-11-15 ENCOUNTER — Ambulatory Visit: Admitting: Nurse Practitioner

## 2024-11-15 ENCOUNTER — Other Ambulatory Visit

## 2024-11-15 ENCOUNTER — Encounter

## 2024-11-15 ENCOUNTER — Telehealth: Admitting: Psychiatry

## 2024-11-16 ENCOUNTER — Telehealth (INDEPENDENT_AMBULATORY_CARE_PROVIDER_SITE_OTHER): Admitting: Psychiatry

## 2024-11-16 ENCOUNTER — Encounter: Payer: Self-pay | Admitting: Psychiatry

## 2024-11-16 ENCOUNTER — Ambulatory Visit: Payer: Self-pay

## 2024-11-16 DIAGNOSIS — I152 Hypertension secondary to endocrine disorders: Secondary | ICD-10-CM

## 2024-11-16 DIAGNOSIS — F331 Major depressive disorder, recurrent, moderate: Secondary | ICD-10-CM

## 2024-11-16 DIAGNOSIS — F411 Generalized anxiety disorder: Secondary | ICD-10-CM

## 2024-11-16 NOTE — Progress Notes (Signed)
 Patient is in office today for a nurse visit for Blood Pressure Check. Patient blood pressure was 122 /72, Patient No chest pain, No shortness of breath, No dyspnea on exertion, No orthopnea, No paroxysmal nocturnal dyspnea, No edema, No palpitations, No syncope  Per last note: Tried the Olmeststartin/HCT began to experience severe muscle spasms, switched back to regular Olmestartin have not experienced severe muscle spasms for the past two days so far  We do not have any appts for this week.   BP normal since switching back to regular olmesartan .  Just wanted to ask, I continued to feel faint up until Saturday, I did use the first 15mg  does of Monjaro, could that have been the cause?  It could be if it is causing low glucose levels. Did you check it?  Mounjaro  should not cause hypoglycemia, but when you go up on the dose you need to go down on insulin  dose. Insulin  can cause hypoglycemia. How many units are you taking. Please give yourself the dose of Mounjaro  and we will decrease Toujeo   30 units of Toujeo   Go down on toujeo  to 26 units and monitor glucose levels, let me know the reading after 3 days and we can go down again if needed  Per Nurse viist Dr. Glenard instructed her to go down to 20units due to low blood sugars.

## 2024-11-16 NOTE — Progress Notes (Signed)
 Virtual Visit via Video Note  I connected with Lindsay Mcdonald on 11/16/24 at  1:30 PM EST by a video enabled telemedicine application and verified that I am speaking with the correct person using two identifiers.  Location Provider Location : [facility] Patient Location : Car  Participants: Patient , Provider   I discussed the limitations of evaluation and management by telemedicine and the availability of in person appointments. The patient expressed understanding and agreed to proceed.   I discussed the assessment and treatment plan with the patient. The patient was provided an opportunity to ask questions and all were answered. The patient agreed with the plan and demonstrated an understanding of the instructions.   The patient was advised to call back or seek an in-person evaluation if the symptoms worsen or if the condition fails to improve as anticipated.   BH MD OP Progress Note  11/16/2024 1:51 PM Lindsay Mcdonald  MRN:  [id number]  Chief Complaint:  Chief Complaint  Patient presents with   Medication Refill   Follow-up   Depression   Anxiety   Discussed the use of AI scribe software for clinical note transcription with the patient, who gave verbal consent to proceed.  History of Present Illness Lindsay Mcdonald is a 63 year old African-American female, currently employed, divorced, lives in [place], has a history of depression, anxiety, chronic pain, caregiver burden, diabetes mellitus, fibromyalgia, morbid obesity, hypertension, OSA, hyperlipidemia was evaluated by telemedicine today for a follow-up appointment.  After starting Celexa  yesterday, she has taken 2 doses so far and reports no side effects at this time.  Celexa  was prescribed on 10/31/2024 and she waited to take this medication since she was having physical complaints of dizziness likely from her Mounjaro .  She continues to take mirtazapine  7.5 mg nightly and hydroxyzine  25 to 50 mg daily as needed. She confirms  she is completely off Viibryd . Overall, she describes feeling good and identifies ongoing life stressors.  With mirtazapine , she states she has been sleeping well except for a few nights when her glucose monitor alarm disrupted her sleep due to low blood sugar. These sleep interruptions occurred this morning and 1 day last week; otherwise, she reports good sleep quality.  She denies suicidal thoughts and thoughts of harming others. At work, she notes some difficulty, including struggling and making some errors, but otherwise reports work is going okay. She reports ongoing stress related to her mother's recent hospitalization and discharge to a nursing home.     Visit Diagnosis:    ICD-10-CM   1. Major depressive disorder, recurrent episode, moderate (HCC)  F33.1     2. Generalized anxiety disorder  F41.1       Past Psychiatric History: I have reviewed past psychiatric history from progress note on 02/08/2024.  Past trials of medications include sertraline , Lexapro , duloxetine , Prozac , nortriptyline, Xanax , trazodone .  Past Medical History:  Past Medical History:  Diagnosis Date   Anxiety    Arthritis 03/2020   osteoarthritis both knees   Chronic pain of right knee    Chronic sinusitis    DDD (degenerative disc disease), cervical    cervical radiculopathy   Depression    Diabetes mellitus    insulin  dependant   Fibromyalgia    GERD (gastroesophageal reflux disease)    High cholesterol    Hyperlipidemia    Hypertension    Insomnia    Muscle pain    Nasal polyp    Neuromuscular disorder (HCC)    diabetic neuropathy   Obesity  Palpitations    Tendonitis of knee, left 04/2020   received steroid injection   Vitamin D  deficiency    Vitamin D  deficiency     Past Surgical History:  Procedure Laterality Date   BREAST REDUCTION SURGERY     COLONOSCOPY WITH PROPOFOL  N/A 02/03/2023   Procedure: COLONOSCOPY WITH PROPOFOL ;  Surgeon: Therisa Bi, MD;  Location: [facility] ENDOSCOPY;   Service: Gastroenterology;  Laterality: N/A;   ETHMOIDECTOMY Right 05/16/2020   Procedure: ETHMOIDECTOMY;  Surgeon: Edda Mt, MD;  Location: [facility] ORS;  Service: ENT;  Laterality: Right;   EXCISION ORAL TUMOR N/A 05/16/2020   Procedure: EXCISION OF SOFT PALATE PAPILLOMA;  Surgeon: Edda Mt, MD;  Location: [facility] ORS;  Service: ENT;  Laterality: N/A;   FRONTAL SINUS EXPLORATION Right 05/16/2020   Procedure: FRONTAL SINUS EXPLORATION;  Surgeon: Edda Mt, MD;  Location: [facility] ORS;  Service: ENT;  Laterality: Right;   IMAGE GUIDED SINUS SURGERY N/A 05/16/2020   Procedure: IMAGE GUIDED SINUS SURGERY;  Surgeon: Edda Mt, MD;  Location: [facility] ORS;  Service: ENT;  Laterality: N/A;   MAXILLARY ANTROSTOMY Bilateral 05/16/2020   Procedure: MAXILLARY ANTROSTOMY with tissue removal;  Surgeon: Edda Mt, MD;  Location: [facility] ORS;  Service: ENT;  Laterality: Bilateral;   nasal endoscopic     OOPHORECTOMY  ?   one ovary removed    REDUCTION MAMMAPLASTY Bilateral 2000   removal of ovary     SPHENOIDECTOMY Right 05/16/2020   Procedure: SPHENOIDECTOMY;  Surgeon: Edda Mt, MD;  Location: [facility] ORS;  Service: ENT;  Laterality: Right;    Family Psychiatric History: I have reviewed family psychiatric history from progress note on 02/08/2024.  Family History:  Family History  Problem Relation Age of Onset   Dementia Mother    Diabetes Mother    Cancer Mother 72       Breast   Breast cancer Mother 13   Gout Mother    Diabetes Father    Breast cancer Maternal Aunt 52   Kidney cancer Maternal Aunt    Kidney disease Maternal Aunt    Stroke Maternal Uncle    Alcohol abuse Daughter    Diabetes Daughter        Oldest Daughter   Alcohol abuse Daughter    Cancer Half-Sister 79       breast cancer   Prostate cancer Neg Hx    Bladder Cancer Neg Hx     Social History: I have reviewed social history from progress note on 02/08/2024. Social History   Socioeconomic History   Marital status:  Divorced    Spouse name: Not on file   Number of children: 2   Years of education: Not on file   Highest education level: Some college, no degree  Occupational History   Occupation: engineer, mining   Tobacco Use   Smoking status: Never   Smokeless tobacco: Never  Vaping Use   Vaping status: Former  Substance and Sexual Activity   Alcohol use: No    Alcohol/week: 0.0 standard drinks of alcohol   Drug use: No   Sexual activity: Not Currently    Partners: Male    Birth control/protection: None  Other Topics Concern   Not on file  Social History Narrative   Divorced and he died since the divorce   Working at patent attorney.    Social Drivers of Health   Financial Resource Strain: Medium Risk (07/20/2024)   Overall Financial Resource Strain (CARDIA)    Difficulty of Paying Living Expenses:  Somewhat hard  Food Insecurity: Food Insecurity Present (07/20/2024)   Hunger Vital Sign    Worried About Running Out of Food in the Last Year: Sometimes true    Ran Out of Food in the Last Year: Sometimes true  Transportation Needs: No Transportation Needs (07/20/2024)   PRAPARE - Administrator, Civil Service (Medical): No    Lack of Transportation (Non-Medical): No  Physical Activity: Inactive (07/20/2024)   Exercise Vital Sign    Days of Exercise per Week: 0 days    Minutes of Exercise per Session: Not on file  Stress: Stress Concern Present (07/20/2024)   [employer] of Occupational Health - Occupational Stress Questionnaire    Feeling of Stress: Very much  Social Connections: Unknown (07/20/2024)   Social Connection and Isolation Panel    Frequency of Communication with Friends and Family: Three times a week    Frequency of Social Gatherings with Friends and Family: Patient declined    Attends Religious Services: Patient declined    Database Administrator or Organizations: Patient declined    Attends Banker Meetings: Not on file    Marital Status:  Divorced    Allergies:  Allergies  Allergen Reactions   Simvastatin     Muscle cramps    Trazodone  Hcl     It may have caused sob    Zetia  [Ezetimibe ] Other (See Comments)    Myalgia    Atorvastatin Other (See Comments)    muscle cramps   Compazine  Other (See Comments)    Twitching and spasms of neck muscles   Penicillins Rash    Reaction under 25, patient unsure that she still has an allergy    Metabolic Disorder Labs: Lab Results  Component Value Date   HGBA1C 7.2 (A) 10/13/2024   MPG 166 02/19/2022   MPG 146 04/09/2020   No results found for: PROLACTIN Lab Results  Component Value Date   CHOL 183 05/27/2024   TRIG 83 05/27/2024   HDL 59 05/27/2024   CHOLHDL 3.1 05/27/2024   VLDL 29 06/27/2016   LDLCALC 107 (H) 05/27/2024   LDLCALC 134 (H) 05/15/2023   Lab Results  Component Value Date   TSH 3.27 10/08/2023   TSH 3.00 02/22/2021    Therapeutic Level Labs: No results found for: LITHIUM No results found for: VALPROATE No results found for: CBMZ  Current Medications: Current Outpatient Medications  Medication Sig Dispense Refill   Albuterol -Budesonide (AIRSUPRA ) 90-80 MCG/ACT AERO Inhale 2 puffs into the lungs in the morning, at noon, in the evening, and at bedtime. 10.7 g 0   aspirin EC 81 MG tablet Take 81 mg by mouth daily. Swallow whole.     baclofen  (LIORESAL ) 10 MG tablet Take 2 tablets (20 mg total) by mouth at bedtime.     buprenorphine  (BUTRANS ) 5 MCG/HR PTWK Place 1 patch onto the skin once a week for 28 days. 4 patch 0   citalopram  (CELEXA ) 10 MG tablet Take 1 tablet (10 mg total) by mouth daily with breakfast. 30 tablet 1   Continuous Glucose Sensor (FREESTYLE LIBRE 3 PLUS SENSOR) MISC 1 each by Other route as directed. Change sensor every 15 days. 3 each 1   estradiol -norethindrone (COMBIPATCH) 0.05-0.14 MG/DAY Place 1 patch onto the skin.     fluticasone  (FLONASE ) 50 MCG/ACT nasal spray Place 2 sprays into both nostrils daily as needed  for allergies or rhinitis.     GVOKE HYPOPEN  2-PACK 1 MG/0.2ML SOAJ Inject 1 mg  into the skin as needed (for hypoglycemia episodes (glucose below 60 and symptomatic)). 0.4 mL 1   hydrOXYzine  (VISTARIL ) 25 MG capsule Take 1-2 capsules (25-50 mg total) by mouth daily as needed for anxiety. 60 capsule 0   icosapent  Ethyl (VASCEPA ) 1 g capsule Take 2 capsules (2 g total) by mouth 2 (two) times daily. 360 capsule 1   insulin  glargine, 1 Unit Dial, (TOUJEO  SOLOSTAR) 300 UNIT/ML Solostar Pen Inject 40-50 Units into the skin every morning. 4.5 mL 0   Insulin  Pen Needle 32G X 6 MM MISC 1 each by Does not apply route daily at 2 PM. 100 each 1   meclizine (ANTIVERT) 25 MG tablet      Melatonin 10 MG TABS Take 10 mg by mouth at bedtime as needed (sleep).      metFORMIN  (GLUCOPHAGE -XR) 750 MG 24 hr tablet Take 2 tablets (1,500 mg total) by mouth daily with breakfast. 180 tablet 0   mirtazapine  (REMERON ) 7.5 MG tablet TAKE 1 TABLET BY MOUTH AT  BEDTIME AS NEEDED. FOR SLEEP 30 tablet 2   montelukast  (SINGULAIR ) 10 MG tablet Take 1 tablet (10 mg total) by mouth at bedtime. 90 tablet 1   olmesartan -hydrochlorothiazide  (BENICAR  HCT) 40-12.5 MG tablet Take 1 tablet by mouth daily. 90 tablet 0   tirzepatide  (MOUNJARO ) 15 MG/0.5ML Pen Inject 15 mg into the skin once a week. 6 mL 0   traMADol  (ULTRAM ) 50 MG tablet Take 1 tablet (50 mg total) by mouth every 12 (twelve) hours as needed for severe pain (pain score 7-10). 30 tablet 0   No current facility-administered medications for this visit.     Musculoskeletal: Strength & Muscle Tone: UTA Gait & Station: Seated Patient leans: N/A  Psychiatric Specialty Exam: Review of Systems  Psychiatric/Behavioral:  Positive for dysphoric mood and sleep disturbance. The patient is nervous/anxious.     There were no vitals taken for this visit.There is no height or weight on file to calculate BMI.  General Appearance: Casual  Eye Contact:  Fair  Speech:  Clear and  Coherent  Volume:  Normal  Mood:  Anxious and Depressed  Affect:  Appropriate  Thought Process:  Goal Directed and Descriptions of Associations: Intact  Orientation:  Full (Time, Place, and Person)  Thought Content: Logical   Suicidal Thoughts:  No  Homicidal Thoughts:  No  Memory:  Immediate;   Fair Recent;   Fair Remote;   Fair  Judgement:  Fair  Insight:  Fair  Psychomotor Activity:  Normal  Concentration:  Concentration: Fair and Attention Span: Fair  Recall:  [employer] of Knowledge: Fair  Language: Fair  Akathisia:  No  Handed:  Right  AIMS (if indicated): not done  Assets:  Communication Skills Desire for Improvement Housing Social Support  ADL's:  Intact  Cognition: WNL  Sleep:  Improving   Screenings: GAD-7    Flowsheet Row Office Visit from 10/31/2024 in [facility] Office Visit from 07/21/2024 in [facility] Office Visit from 07/19/2024 in [facility] Office Visit from 05/27/2024 in [facility] Visit from 04/25/2024 in [facility]  Total GAD-7 Score 16 21 7 21 20    PHQ2-9    Flowsheet Row Procedure visit from 11/09/2024 in [facility] Visit from 11/01/2024 in [facility] Visit from 10/31/2024 in [facility] Office Visit from 10/20/2024 in [facility] Visit from 10/13/2024 in [facility]  PHQ-2 Total Score 1 1 5  0 0  PHQ-9 Total Score -- -- 16 -- --   Flowsheet Row Video Visit from 11/16/2024 in [facility] Office Visit from 10/31/2024 in [facility] Video Visit from 08/19/2024 in [facility]  C-SSRS RISK CATEGORY No Risk No Risk No Risk     Assessment and Plan: Kemya Shed is a 63 year old African-American female who presented for a follow-up appointment, discussed assessment and plan as noted below.  1. Major depressive disorder, recurrent episode, moderate (HCC)-unstable Currently on Celexa  which was prescribed recently, tapered off of Viibryd  and tolerated it well. Continue Celexa  10 mg daily.  Plan to readjust the dosage gradually Continue Mirtazapine  7.5 mg at bedtime, advised to take it regularly. Continue psychotherapy sessions with Ms. Almarie Ligas.  2. Generalized anxiety disorder-improving Currently reports ongoing stressors although anxiety has been improving Continue Celexa  as prescribed Continue Hydroxyzine  25-50 mg daily as needed. Continue psychotherapy sessions.  Follow-up Follow-up in clinic in 3 weeks or sooner if needed.  I have communicated with staff to keep this patient on a wait list for sooner visit in person needed.    Collaboration of Care: Collaboration of Care: Referral or follow-up with counselor/therapist AEB encouraged to continue psychotherapy sessions.  I have coordinated care with Ms. Ligas.  Patient/Guardian was advised Release of Information must be obtained prior to any record release in order to collaborate their care with an outside provider. Patient/Guardian was advised if they have not already done so to contact the registration department to sign all necessary forms in order for us  to release information regarding their care.   Consent: Patient/Guardian gives verbal consent for treatment and assignment of benefits for services provided during this visit. Patient/Guardian expressed understanding and agreed to proceed.   This note was generated in part or whole with voice recognition software. Voice recognition is usually quite  accurate but there are transcription errors that can and very often do occur. I apologize for any typographical errors that were not detected and corrected.    Lindsay Couse, MD 11/16/2024, 1:51 PM

## 2024-11-17 ENCOUNTER — Other Ambulatory Visit: Payer: Self-pay | Admitting: Family Medicine

## 2024-11-21 ENCOUNTER — Other Ambulatory Visit: Payer: Self-pay | Admitting: Psychiatry

## 2024-11-21 ENCOUNTER — Encounter: Payer: Self-pay | Admitting: Family Medicine

## 2024-11-21 DIAGNOSIS — F411 Generalized anxiety disorder: Secondary | ICD-10-CM

## 2024-11-22 ENCOUNTER — Other Ambulatory Visit: Payer: Self-pay | Admitting: Family Medicine

## 2024-11-22 ENCOUNTER — Other Ambulatory Visit: Payer: Self-pay | Admitting: Psychiatry

## 2024-11-22 ENCOUNTER — Ambulatory Visit: Admitting: Professional Counselor

## 2024-11-22 DIAGNOSIS — F331 Major depressive disorder, recurrent, moderate: Secondary | ICD-10-CM

## 2024-11-22 DIAGNOSIS — F411 Generalized anxiety disorder: Secondary | ICD-10-CM

## 2024-11-22 MED ORDER — OLMESARTAN MEDOXOMIL 40 MG PO TABS: 40.0000 mg | ORAL_TABLET | Freq: Every day | ORAL | 1 refills | Status: DC

## 2024-11-22 NOTE — Progress Notes (Signed)
 THERAPIST PROGRESS NOTE  Virtual Visit via Video Note  I connected with Lindsay Mcdonald on 11/22/24 at  9:00 AM EST by a video enabled telemedicine application and verified that I am speaking with the correct person using two identifiers.  Location: Patient: Home Provider: Office   I discussed the limitations of evaluation and management by telemedicine and the availability of in person appointments. The patient expressed understanding and agreed to proceed.   I discussed the assessment and treatment plan with the patient. The patient was provided an opportunity to ask questions and all were answered. The patient agreed with the plan and demonstrated an understanding of the instructions.   The patient was advised to call back or seek an in-person evaluation if the symptoms worsen or if the condition fails to improve as anticipated.  I provided 49 minutes of non-face-to-face time during this encounter. Lindsay Mcdonald, [facility]  Session Time: 9:07 AM - 9:56 AM   Participation Level: Active  Behavioral Response: Casual, Alert, Anxious and Dysphoric  Type of Therapy: Individual Therapy  Treatment Goals addressed: Active OP Depression  LTG: I'd like to be able to make decisions and have clarity and feel good about my decisions. I'd like to also feel better with relationships. How to manage stress.  (Progressing)                Start:  05/25/24    Expected End:  05/24/25    Goal Note Reviewed 09/06/24 Still need, not where I want to be, just trying to put it all together.   STG: Stress management. To improve stress management AEB reduction in mood symptoms by practicing coping skills over the next 12 weeks (Progressing)  Goal Note Reviewed 09/06/24 I think a little better. I don't know if it's due to the medication and overeating, but that's not coping, but still working on it. Some things you mentioned today I can try to implement.   STG: Self-care, self-love, I'm kind of  hard on myself. To improve sense of self AEB self-report in increase of self-care activities and restructuring maladaptive patterns of thinking over the next 90 days.  (Progressing)  Goal Note Reviewed 09/06/24 Like we discussed today, the acceptance part is the key, if I can get there.   STG: I just want to be able to control that more. (Bringing up graphic images like traumatic things seen on TV). To reduce distress brought on by OCD tendencies AEB self-report reduction in obsessive and compulsive behaviors with exposure and CBT.  (Progressing)  Goal Note Reviewed 09/06/24 A little bit better. I think when you're busy that helps distract some, but when you're by yourself, that still needs assistance.  ProgressTowards Goals: Progressing  Interventions: CBT, Motivational Interviewing, and Supportive  Summary: Lindsay Mcdonald is a 63 y.o. female who presents with a history of anxiety and depression. She appeared somber but oriented x5. She reported a death of a close family friend. She also noted additional decline in her mother's health. Anyra inquired about how to have more forgiveness for herself. She shared examples. Lakima was receptive to Socratic questioning and identifying other contributing factors rather than blaming herself for many of these things. She also inquired about working with authority. She explored ways to be effective for herself. She noted beliefs that may be interfering with being assertive and noted ways to work on reducing this.  Therapist Response: Conducted session with Lindsay. Began session with check-in/update since previous session. Utilized empathetic and reflective listening. Used  open-ended questions to facilitate discussion and summarized Kamya's thoughts/feelings. Explored examples Nansi felt she needed to work on forgiveness around. Used Socratic questioning to help challenge thinking and highlighted other contributing factors. Discussed different relationships with  authority and ways to be effective for herself. Identified beliefs around communication and restructured thinking to improve assertiveness. Scheduled additional appointment and concluded session.   Suicidal/Homicidal: No  Plan: Return again in 1 week.  Diagnosis: Generalized anxiety disorder  Major depressive disorder, recurrent episode, moderate (HCC)  Collaboration of Care: Medication Management AEB chart review  Patient/Guardian was advised Release of Information must be obtained prior to any record release in order to collaborate their care with an outside provider. Patient/Guardian was advised if they have not already done so to contact the registration department to sign all necessary forms in order for us  to release information regarding their care.   Consent: Patient/Guardian gives verbal consent for treatment and assignment of benefits for services provided during this visit. Patient/Guardian expressed understanding and agreed to proceed.   Lindsay Mcdonald, [facility] 11/22/2024

## 2024-11-22 NOTE — Telephone Encounter (Signed)
 Requested Prescriptions  Refused Prescriptions Disp Refills   olmesartan  (BENICAR ) 40 MG tablet [Pharmacy Med Name: Olmesartan  Medoxomil 40 MG Oral Tablet] 90 tablet 3    Sig: TAKE 1 TABLET BY MOUTH DAILY     Cardiovascular:  Angiotensin Receptor Blockers Passed - 11/22/2024 11:57 AM      Passed - Cr in normal range and within 180 days    Creat  Date Value Ref Range Status  05/27/2024 0.82 0.50 - 1.05 mg/dL Final   Creatinine, Ser  Date Value Ref Range Status  06/17/2024 0.93 0.57 - 1.00 mg/dL Final   Creatinine, Urine  Date Value Ref Range Status  05/27/2024 164 20 - 275 mg/dL Final         Passed - K in normal range and within 180 days    Potassium  Date Value Ref Range Status  06/17/2024 4.3 3.5 - 5.2 mmol/L Final  08/01/2014 3.6 3.5 - 5.1 mmol/L Final         Passed - Patient is not pregnant      Passed - Last BP in normal range    BP Readings from Last 1 Encounters:  11/09/24 126/63         Passed - Valid encounter within last 6 months    Recent Outpatient Visits           1 month ago Dyslipidemia associated with type 2 diabetes mellitus [facility])   [place] [facility] Glenard Mire, MD   4 months ago Dyslipidemia associated with type 2 diabetes mellitus [facility])   [place] [facility] Glenard Mire, MD   5 months ago Dyslipidemia associated with type 2 diabetes mellitus [facility])   [place] [facility] Glenard Mire, MD   7 months ago Diabetic peripheral neuropathy associated with type 2 diabetes mellitus [facility])    [facility] Glenard Mire, MD   7 months ago Obesity, diabetes, and hypertension syndrome [facility])   [facility] Sowles, Krichna, MD

## 2024-11-25 NOTE — Progress Notes (Signed)
 PROVIDER NOTE: Interpretation of information contained herein should be left to medically-trained personnel. Specific patient instructions are provided elsewhere under Patient Instructions section of medical record. This document was created in part using AI and STT-dictation technology, any transcriptional errors that may result from this process are unintentional.  Patient: Lindsay Mcdonald  Service: E/M   PCP: Sowles, Krichna, MD  DOB: 11-Dec-1961  DOS: 11/29/2024  Provider: Emmy MARLA Blanch, NP  MRN: [id number]  Delivery: Face-to-face  Specialty: Interventional Pain Management  Type: Established Patient  Setting: Ambulatory outpatient facility  Specialty designation: 09  Referring Prov.: Sowles, Krichna, MD  Location: Outpatient office facility       History of present illness (HPI) [name], a 63 y.o. year old female, is here today because of her knee pain and hip pain (bilateral). Ms. Gilham primary complain today is Knee Pain  Pertinent problems: Ms. Wiegand has Primary osteoarthritis of right knee; Depression, major, recurrent, mild (HCC); Morbid obesity with BMI of 40.0-44.9, adult (HCC); Vitamin D  deficiency; Fibromyalgia; History of marijuana use; Dyslipidemia associated with type 2 diabetes mellitus (HCC); Herniated intervertebral disc of lumbar spine; Major depressive disorder, recurrent episode, moderate (HCC); Lumbar spondylosis; Chronic radicular lumbar pain; Rotator cuff arthropathy of both shoulders (left>right); and Chronic pain syndrome on their pertinent problem list.  Pain Assessment: Severity of Chronic pain is reported as a 0-No pain/10. Location: Knee Right, Left/None today. Onset: More than a month ago. Quality: Aching, Constant, Dull. Timing: Intermittent. Modifying factor(s): Denies. Vitals:  height is 5' 4 (1.626 m) and weight is 243 lb (110.2 kg). Her temporal temperature is 97 F (36.1 C) (abnormal). Her blood pressure is 149/71 (abnormal) and her pulse is  75. Her respiration is 18 and oxygen saturation is 100%.  BMI: Estimated body mass index is 41.71 kg/m as calculated from the following:   Height as of this encounter: 5' 4 (1.626 m).   Weight as of this encounter: 243 lb (110.2 kg).  Last encounter: 11/01/2024. Last procedure: 11/09/2024  Reason for encounter: both, medication management and post-procedure evaluation and assessment. No change in medical history since last visit.  Patient's pain is at baseline.  Patient continues multimodal pain regimen as prescribed.  States that it provides pain relief and improvement in functional status.   Discussed the use of AI scribe software for clinical note transcription with the patient, who gave verbal consent to proceed.  History of Present Illness   Lindsay Mcdonald is a 63 year old female who presents for follow-up after a knee injection.  She reports significant improvement in her knee pain following a recent knee injection, describing it as 'like ninety five percent now'. She experienced some swelling after the procedure, which resolved on its own. She is currently using a Butrans  5 mcg patch for pain management and has not taken tramadol  since starting the patch, as she finds the patch more effective.  We discontinue tramadol .   She has torn rotator cuffs in both shoulders.  She experiences hip pain when lying on her sides for extended periods, affecting both hips. She previously received a bursa injection in October 2024 for her hip pain, which has been a consistent issue.  Her current medications include the Butrans  patch, which she receives via home delivery, and she reports no side effects from its use.      Procedure Type:  Steroid Intra-articular Knee Injection #1  Laterality: Right (-RT) Level/approach: Medial Imaging guidance: None required ([id number]) Anesthesia: Local anesthesia (1-2% Lidocaine ) DOS: 11/09/2024  Performed by: Wallie Sherry, MD   Purpose:  Diagnostic/Therapeutic Indications: Knee arthralgia associated to osteoarthritis of the knee 1. Primary osteoarthritis of right knee     NAS-11 score:         Pre-procedure: 4 /10         Post-procedure: 4 /10  Post-Procedure Evaluation    Effectiveness:  Initial hour after procedure: 100 % . Subsequent 4-6 hours post-procedure: 100 % . Analgesia past initial 6 hours: 95 % . Ongoing improvement:  Analgesic:  95% Function: Ms. Rogacki reports improvement in function ROM: Ms. Hennick reports improvement in ROM Interpretation: Ms. Hartung underwent a diagnostic/therapeutic knee injection on November 09, 2024.  She reports initially 100% pain relief and functional improvement during local anesthetic phase, followed by sustained 95% ongoing pain relief and functional improvement since the procedure. Pharmacotherapy Assessment   Butrans  5 mcg patch, once a week. Monitoring: [place] PMP: PDMP reviewed during this encounter.       Pharmacotherapy: No side-effects or adverse reactions reported. Compliance: No problems identified. Effectiveness: Clinically acceptable.  Erlene Doyal SAUNDERS, [place]  11/29/2024 11:56 AM  Sign when Signing Visit Nursing Pain Medication Assessment:  Safety precautions to be maintained throughout the outpatient stay will include: orient to surroundings, keep bed in low position, maintain call bell within reach at all times, provide assistance with transfer out of bed and ambulation.  Medication Inspection Compliance: Ms. Noguchi did not comply with our request to bring her pills to be counted. She was reminded that bringing the medication bottles, even when empty, is a requirement.  Medication: None brought in. Pill/Patch Count: None available to be counted. Bottle Appearance: No container available. Did not bring bottle(s) to appointment. Filled Date: N/A Last Medication intake:  11/26/2024    UDS:  Summary  Date Value Ref Range Status  10/20/2024 FINAL   Final    Comment:    ==================================================================== Compliance Drug Analysis, Ur ==================================================================== Test                             Result       Flag       Units  Drug Present and Declared for Prescription Verification   Baclofen                        PRESENT      EXPECTED   Mirtazapine                     PRESENT      EXPECTED   Vilazodone                      PRESENT      EXPECTED   Naproxen                        PRESENT      EXPECTED   Hydroxyzine                     PRESENT      EXPECTED  Drug Absent but Declared for Prescription Verification   Tramadol                        Not Detected UNEXPECTED ng/mg creat   Salicylate                     Not Detected UNEXPECTED  Aspirin, as indicated in the declared medication list, is not always    detected even when used as directed.    Ibuprofen                      Not Detected UNEXPECTED    Ibuprofen, as indicated in the declared medication list, is not    always detected even when used as directed.  ==================================================================== Test                      Result    Flag   Units      Ref Range   Creatinine              169              mg/dL      >=79 ==================================================================== Declared Medications:  The flagging and interpretation on this report are based on the  following declared medications.  Unexpected results may arise from  inaccuracies in the declared medications.   **Note: The testing scope of this panel includes these medications:   Baclofen  (Lioresal )  Hydroxyzine  (Vistaril )  Mirtazapine  (Remeron )  Naproxen  (Aleve )  Tramadol  (Ultram )  Vilazodone    **Note: The testing scope of this panel does not include small to  moderate amounts of these reported medications:   Aspirin  Ibuprofen (Advil)   **Note: The testing scope of this panel does not include  the  following reported medications:   Albuterol  (Airsupra )  Budesonide (Airsupra )  Estradiol  (Combipatch)  Fluticasone  (Flonase )  Glucagon  (Gvoke)  Icosapent  (Vascepa )  Insulin  (Toujeo )  Meclizine (Antivert)  Melatonin  Metformin   Montelukast  (Singulair )  Norethindrone (Combipatch)  Olmesartan  (Benicar )  Tirzepatide  (Mounjaro ) ==================================================================== For clinical consultation, please call [phone]. ====================================================================     No results found for: CBDTHCR No results found for: D8THCCBX No results found for: D9THCCBX  ROS  Constitutional: Denies any fever or chills Gastrointestinal: No reported hemesis, hematochezia, vomiting, or acute GI distress Musculoskeletal: Bilateral hip pain, right knee pain Neurological: No reported episodes of acute onset apraxia, aphasia, dysarthria, agnosia, amnesia, paralysis, loss of coordination, or loss of consciousness  Medication Review  Albuterol -Budesonide, FreeStyle Libre 3 Plus Sensor, Glucagon , Insulin  Pen Needle, Melatonin, Omega-3, aspirin EC, baclofen , buprenorphine , citalopram , estradiol -norethindrone, fluticasone , hydrOXYzine , icosapent  Ethyl, insulin  glargine (1 Unit Dial), meclizine, metFORMIN , mirtazapine , montelukast , olmesartan , tirzepatide , and traMADol   History Review  Allergy: Ms. Occhipinti is allergic to simvastatin, trazodone  hcl, zetia  [ezetimibe ], atorvastatin, compazine , and penicillins. Drug: Ms. Fodera  reports no history of drug use. Alcohol:  reports no history of alcohol use. Tobacco:  reports that she has never smoked. She has never used smokeless tobacco. Social: Ms. Wenk  reports that she has never smoked. She has never used smokeless tobacco. She reports that she does not drink alcohol and does not use drugs. Medical:  has a past medical history of Anxiety, Arthritis (03/2020), Chronic pain of  right knee, Chronic sinusitis, DDD (degenerative disc disease), cervical, Depression, Diabetes mellitus, Fibromyalgia, GERD (gastroesophageal reflux disease), High cholesterol, Hyperlipidemia, Hypertension, Insomnia, Muscle pain, Nasal polyp, Neuromuscular disorder (HCC), Obesity, Palpitations, Tendonitis of knee, left (04/2020), Vitamin D  deficiency, and Vitamin D  deficiency. Surgical: Ms. 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 Family: family history includes Alcohol abuse in her daughter and daughter; Breast cancer (age of onset: 49) in her maternal aunt; Breast cancer (age of onset: 72) in her mother; Cancer (age of onset: 77) in her half-sister; Cancer (age of onset: 43) in her mother; Dementia in her mother; Diabetes in her daughter, father, and mother; Gout in her mother; Kidney cancer in her maternal aunt; Kidney disease in her maternal aunt; Stroke in her maternal uncle.  Laboratory Chemistry Profile   Renal Lab Results  Component Value Date   BUN 21 06/17/2024   CREATININE 0.93 06/17/2024   LABCREA 164 05/27/2024   BCR 23 06/17/2024   GFRAA 93 02/22/2021   GFRNONAA >60 02/14/2024    Hepatic Lab Results  Component Value Date   AST 22 05/27/2024   ALT 19 05/27/2024   ALBUMIN 3.2 (L) 02/14/2024   ALKPHOS 66 02/14/2024   LIPASE 49 02/14/2024    Electrolytes Lab Results  Component Value Date   NA 142 06/17/2024   K 4.3 06/17/2024   CL 104 06/17/2024   CALCIUM  9.3 06/17/2024   MG 1.9 08/13/2022    Bone Lab Results  Component Value Date   VD25OH 22 (L)  06/27/2016    Inflammation (CRP: Acute Phase) (ESR: Chronic Phase) Lab Results  Component Value Date   ESRSEDRATE 2 07/20/2012         Note: Above Lab results reviewed.  Recent Imaging Review  DG PAIN CLINIC C-ARM 1-60 MIN NO REPORT Fluoro was used, but no Radiologist interpretation will be provided.  Please refer to NOTES tab for provider progress note. Note: Reviewed        Physical Exam  Vitals: BP (!) 149/71 (BP Location: Right Arm, Patient Position: Sitting, Cuff Size: Large)   Pulse 75   Temp (!) 97 F (36.1 C) (Temporal)   Resp 18   Ht 5' 4 (1.626 m)   Wt 243 lb (110.2 kg)   SpO2 100%   BMI 41.71 kg/m  BMI: Estimated body mass index is 41.71 kg/m as calculated from the following:   Height as of this encounter: 5' 4 (1.626 m).   Weight as of this encounter: 243 lb (110.2 kg). Ideal: Ideal body weight: 54.7 kg (120 lb 9.5 oz) Adjusted ideal body weight: 76.9 kg (169 lb 8.9 oz) General appearance: Well nourished, well developed, and well hydrated. In no apparent acute distress Mental status: Alert, oriented x 3 (person, place, & time)       Respiratory: No evidence of acute respiratory distress Eyes: PERLA  Musculoskeletal: Bilateral hip pain and right knee pain Assessment   Diagnosis Status  1. Primary osteoarthritis of right knee   2. Medication management   3. Lumbar facet arthropathy   4. Lumbar spondylosis   5. Chronic radicular lumbar pain (right L5/S1 disc herniation)   6. Rotator cuff arthropathy of both shoulders (left>right)   7. Fibromyalgia   8. Chronic pain syndrome    Improved Controlled Controlled   Updated Problems: Problem  Medication Management    Plan of Care  Problem-specific:  Assessment and Plan    Chronic pain syndrome Managed with Butrans  5 mcg patch, providing better relief than tramadol . No side effects or adverse reaction reported to current medication regimen.  - Continue Butrans  5 mcg patch. - Discontinue  tramadol . Patient's pain is controlled with butrans  patch, will continue on current medication regimen. Prescribing drug monitoring (PDMP) reviewed, findings consistent with the use of prescribed medication.  Urine  drug screening (UDS) up to date and consistent with the use of prescribed medication.  Schedule follow-up in 30 days for medication management.   Rotator cuff arthropathy of both shoulders Rotator cuff arthropathy with no current pain. Potential for future interventions if pain develops. - Consider bilateral shoulder injections or nerve block if pain develops.  Knee osteoarthritis Moderate tricompartmental arthritis changes on x-ray. Recent knee injection provided 95% pain improvement. - Continue current management as knee injection provided significant relief.  Hip osteoarthritis Mild to moderate arthritis changes with central joint space narrowing on x-ray. Discomfort when lying on either side, particularly affecting the right hip. Discussed differentiating bursitis from arthritis and potential benefits of steroid injections. - Consider hip injection if discomfort persists. - Discuss potential interventions at next visit in January.       Ms. Lizeth Bencosme has a current medication list which includes the following long-term medication(s): citalopram , estradiol -norethindrone, icosapent  ethyl, toujeo  solostar, metformin , mirtazapine , montelukast , and olmesartan .  Pharmacotherapy (Medications Ordered): Meds ordered this encounter  Medications   buprenorphine  (BUTRANS ) 5 MCG/HR PTWK    Sig: Place 1 patch onto the skin once a week for 28 days.    Dispense:  4 patch    Refill:  0   Orders:  No orders of the defined types were placed in this encounter.       Return in about 1 month (around 12/30/2024) for (F2F), (MM), Emmy Blanch NP.    Recent Visits Date Type Provider Dept  11/09/24 Procedure visit Marcelino Nurse, MD [facility]  11/01/24 Office Visit Nehemyah Foushee,  Anaiyah Anglemyer K, NP [facility]  10/20/24 Office Visit Marcelino Nurse, MD [facility]  Showing recent visits within past 90 days and meeting all other requirements Today's Visits Date Type Provider Dept  11/29/24 Office Visit Yasseen Salls K, NP [facility]  Showing today's visits and meeting all other requirements Future Appointments Date Type Provider Dept  12/29/24 Appointment Ismelda Weatherman K, NP [facility]  Showing future appointments within next 90 days and meeting all other requirements  I discussed the assessment and treatment plan with the patient. The patient was provided an opportunity to ask questions and all were answered. The patient agreed with the plan and demonstrated an understanding of the instructions.  Patient advised to call back or seek an in-person evaluation if the symptoms or condition worsens.  I personally spent a total of 30 minutes in the care of the patient today including preparing to see the patient, getting/reviewing separately obtained history, performing a medically appropriate exam/evaluation, counseling and educating, placing orders, referring and communicating with other health care professionals, documenting clinical information in the EHR, independently interpreting results, communicating results, and coordinating care.   Note by: Lealand Elting K Golden Emile, NP (TTS and AI technology used. I apologize for any typographical errors that were not detected and corrected.) Date: 11/29/2024; Time: 12:58 PM

## 2024-11-28 ENCOUNTER — Ambulatory Visit: Admitting: Professional Counselor

## 2024-11-28 DIAGNOSIS — F411 Generalized anxiety disorder: Secondary | ICD-10-CM

## 2024-11-28 DIAGNOSIS — Z636 Dependent relative needing care at home: Secondary | ICD-10-CM

## 2024-11-28 DIAGNOSIS — F331 Major depressive disorder, recurrent, moderate: Secondary | ICD-10-CM

## 2024-11-28 NOTE — Progress Notes (Signed)
 THERAPIST PROGRESS NOTE  Virtual Visit via Video Note  I connected with Lindsay Mcdonald on 11/28/24 at  8:00 AM EST by a video enabled telemedicine application and verified that I am speaking with the correct person using two identifiers.  Location: Patient: Home Provider: Remote office   I discussed the limitations of evaluation and management by telemedicine and the availability of in person appointments. The patient expressed understanding and agreed to proceed.  I discussed the assessment and treatment plan with the patient. The patient was provided an opportunity to ask questions and all were answered. The patient agreed with the plan and demonstrated an understanding of the instructions.   The patient was advised to call back or seek an in-person evaluation if the symptoms worsen or if the condition fails to improve as anticipated.  I provided 54 minutes of non-face-to-face time during this encounter. [name], [facility]  Session Time: 8:01 AM - 8:55 AM  Participation Level: Active  Behavioral Response: Casual, Alert, Anxious and Dysphoric  Type of Therapy: Individual Therapy  Treatment Goals addressed: Active OP Depression  LTG: I'd like to be able to make decisions and have clarity and feel good about my decisions. I'd like to also feel better with relationships. How to manage stress.  (Progressing)                Start:  05/25/24    Expected End:  05/24/25    Goal Note Reviewed 09/06/24 Still need, not where I want to be, just trying to put it all together.   STG: Stress management. To improve stress management AEB reduction in mood symptoms by practicing coping skills over the next 12 weeks (Progressing)  Goal Note Reviewed 09/06/24 I think a little better. I don't know if it's due to the medication and overeating, but that's not coping, but still working on it. Some things you mentioned today I can try to implement.   STG: Self-care, self-love, I'm kind  of hard on myself. To improve sense of self AEB self-report in increase of self-care activities and restructuring maladaptive patterns of thinking over the next 90 days.  (Progressing)  Goal Note Reviewed 09/06/24 Like we discussed today, the acceptance part is the key, if I can get there.   STG: I just want to be able to control that more. (Bringing up graphic images like traumatic things seen on TV). To reduce distress brought on by OCD tendencies AEB self-report reduction in obsessive and compulsive behaviors with exposure and CBT.  (Progressing)  Goal Note Reviewed 09/06/24 A little bit better. I think when you're busy that helps distract some, but when you're by yourself, that still needs assistance.  ProgressTowards Goals: Progressing  Interventions: CBT and Supportive  Summary: Lindsay Mcdonald is a 63 y.o. female who presents with a history of anxiety, depression, and caregiver burden. She appeared somber but oriented x5. She reported she has started taking her medications. She noted some improvement already. Lindsay Mcdonald also noted some attempts as changing her thinking about past choices. She noted traumatic events that she still struggles with. She appeared receptive to stuck points and working to focus on all the information about the situations. Lindsay Mcdonald reported she had a job interview for a new position at her company. She noted she is still working remote due to [employer]. She is noticing how working from home makes her want to stay home all the time. She was receptive to cycle of avoidance. She was in agreement this may also  be showing up with her visiting with her mother, and wanting to avoid seeing her mother's decline.   Therapist Response: Conducted session with Lindsay. Began session with check-in/update since previous session. Utilized empathetic and reflective listening. Used open-ended questions to facilitate discussion and summarized Lindsay Mcdonald's thoughts/feelings. Provided psychoeducation on stuck  points and used Socratic questioning to help challenge negative beliefs. Reminded Lindsay Mcdonald of the cycle of avoidance and tendency to increase mental health symptoms. Encouraged her to practice exposure especially since medication seems to be helping with stabilization. Scheduled additional appointment and concluded session.   Suicidal/Homicidal: No  Plan: Return again in 2 weeks.  Diagnosis: Generalized anxiety disorder  Major depressive disorder, recurrent episode, moderate (HCC)  Caregiver burden  Collaboration of Care: Medication Management AEB chart review  Patient/Guardian was advised Release of Information must be obtained prior to any record release in order to collaborate their care with an outside provider. Patient/Guardian was advised if they have not already done so to contact the registration department to sign all necessary forms in order for us  to release information regarding their care.   Consent: Patient/Guardian gives verbal consent for treatment and assignment of benefits for services provided during this visit. Patient/Guardian expressed understanding and agreed to proceed.   [name], [facility] 11/28/2024

## 2024-11-29 ENCOUNTER — Ambulatory Visit: Payer: Self-pay | Admitting: Nurse Practitioner

## 2024-11-29 ENCOUNTER — Other Ambulatory Visit: Payer: Self-pay | Admitting: Physician Assistant

## 2024-11-29 ENCOUNTER — Ambulatory Visit
Admission: RE | Admit: 2024-11-29 | Discharge: 2024-11-29 | Disposition: A | Source: Ambulatory Visit | Attending: Physician Assistant | Admitting: Physician Assistant

## 2024-11-29 ENCOUNTER — Encounter: Payer: Self-pay | Admitting: Nurse Practitioner

## 2024-11-29 VITALS — BP 149/71 | HR 75 | Temp 97.0°F | Resp 18 | Ht 64.0 in | Wt 243.0 lb

## 2024-11-29 DIAGNOSIS — Z79899 Other long term (current) drug therapy: Secondary | ICD-10-CM | POA: Insufficient documentation

## 2024-11-29 DIAGNOSIS — R200 Anesthesia of skin: Secondary | ICD-10-CM

## 2024-11-29 DIAGNOSIS — M47816 Spondylosis without myelopathy or radiculopathy, lumbar region: Secondary | ICD-10-CM

## 2024-11-29 DIAGNOSIS — M1711 Unilateral primary osteoarthritis, right knee: Secondary | ICD-10-CM

## 2024-11-29 DIAGNOSIS — G8929 Other chronic pain: Secondary | ICD-10-CM

## 2024-11-29 DIAGNOSIS — M5430 Sciatica, unspecified side: Secondary | ICD-10-CM

## 2024-11-29 DIAGNOSIS — M797 Fibromyalgia: Secondary | ICD-10-CM

## 2024-11-29 DIAGNOSIS — G894 Chronic pain syndrome: Secondary | ICD-10-CM

## 2024-11-29 DIAGNOSIS — M12811 Other specific arthropathies, not elsewhere classified, right shoulder: Secondary | ICD-10-CM

## 2024-11-29 MED ORDER — BUPRENORPHINE 5 MCG/HR TD PTWK: 1.0000 | MEDICATED_PATCH | TRANSDERMAL | 0 refills | Status: AC

## 2024-11-29 NOTE — Progress Notes (Signed)
 Nursing Pain Medication Assessment:  Safety precautions to be maintained throughout the outpatient stay will include: orient to surroundings, keep bed in low position, maintain call bell within reach at all times, provide assistance with transfer out of bed and ambulation.  Medication Inspection Compliance: Lindsay Mcdonald did not comply with our request to bring her pills to be counted. She was reminded that bringing the medication bottles, even when empty, is a requirement.  Medication: None brought in. Pill/Patch Count: None available to be counted. Bottle Appearance: No container available. Did not bring bottle(s) to appointment. Filled Date: N/A Last Medication intake:  11/26/2024

## 2024-11-29 NOTE — Progress Notes (Signed)
 Today the history is gathered from: 100% - patient  0% - alone   RECORDS SUMMARY: I have reviewed the note dated 08/13/22 from dr glenard who has indicated:  patient with muscle spasms  Given these abnormal neurologic findings, a referral to neurology has been recommended.  REFERRING PHYSICIAN: Lane Arthea Locus, MD PRIMARY CARE PHYSICIAN:  Glenard Dorette FALCON, MD   IMPRESSION/PLAN  Lindsay Mcdonald is a 63 y.o. female presenting for evaluation of Assessment & Plan Peripheral neuropathy Chronic peripheral neuropathy with tingling in feet and occasional leg spasms. Symptoms may be exacerbated by neuropathy or vitamin deficiencies. Baclofen  20 mg nightly has been helpful in managing symptoms. No new nerve conduction studies planned unless symptoms change significantly. - Continue baclofen  20 mg nightly. - Provided balance exercises for home practice. - Referral for bilateral AFOs for foot drop  - Encouraged lifestyle modifications to manage neuropathy, including blood pressure, diabetes, and cholesterol management. - Will complete paperwork for work from home accommodations given patient with severe neuropathy, imbalance  Chronic low back pain with possible lumbar radiculopathy Chronic low back pain with recent exacerbation due to lifting. Pain managed with a pain patch. New onset of numbness and tingling in legs, with a burning sensation in legs while driving, suggests possible lumbar radiculopathy. Differential includes spinal cord compression. - Ordered STAT MRI of the lumbar spine without contrast to assess for spinal cord compression and new onset of numbness, tingling in groin area, urinary incontinence. - Continue follow-up with pain management for ongoing care. - Advised discussion with primary care provider regarding bladder symptoms. - Will consider referral to urology or urogynecology if symptoms persist.  Vitamin D  deficiency Previously identified vitamin D  deficiency. Currently  taking over-the-counter vitamin D  supplementation as advised by primary care provider. - Ordered lab tests to recheck vitamin D  levels. - Continue current vitamin D  supplementation regimen.  Follow-up 3 to 6 months  Recording duration: 20 minutes  Medications previously tried: Trazodone  Seroquel  Nortripyline  CHIEF COMPLAINT & HPI  Lindsay Mcdonald is a 63 y.o. female presenting for evaluation of: Chief Complaint  Patient presents with  . Leg Pain  . Back Pain    SPASMS/ LEG PAIN/ RESTLESS LEGS /NUMBNESS History of Present Illness Lindsay Mcdonald is a 63 year old female with chronic low back pain who presents with worsening symptoms and cramping.  She has chronic low back pain and is under the care of physiatry and pain management. Recently, she experienced muscle spasms, which she attributed to a change in her blood pressure medication to HCTZ. These spasms subsided after reverting to her previous medication. She uses a pain patch and takes baclofen  20 mg nightly, which she finds helpful.  She reports numbness during urination, which she noticed a few days ago, similar to symptoms experienced years ago. The numbness is described as a lack of sensation when urinating, though she can feel when wiping. There is no associated pain or burning during urination. She also reports urgency and occasional incontinence, ongoing since the summer. She does not currently see a urologist and has not discussed her bladder issues with her primary care provider.  She experiences tingling in her feet and had an episode of burning pain in her legs while driving a few weeks ago, lasting about 30 minutes. She describes her walking as 'a little funnier' with occasional stiffness and a dragging sensation in her feet.  Her vitamin D  levels were very low in October per prior lab results, and she has been taking 2000  IU daily as recommended by her primary care provider. She also uses electrolyte supplements  occasionally to manage hydration, especially when urinating frequently.  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   DATA SUMMARY: 08/07/2023 MRI LUMBAR SPINE W WO IMPRESSION:  1. 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  2. Mild-moderate left foraminal stenosis at L4-L5.  3. Mild left foraminal stenosis at L3-L4.  4. No canal stenosis at any level.   06/25/2023 HM DIABETES EYE EXAM IMPRESSION: No retinopathy.  12/10/2022 EMG LOWERS Abnormal study.  There is electrodiagnostic evidence of a  chronic, severe sensorimotor polyneuropathy in the lower extremities   VISIT SUMMARIES:   MEDICATIONS Current Outpatient Medications  Medication Sig Dispense Refill  . aspirin 81 MG EC tablet Take 81 mg by mouth once daily.    . baclofen  (LIORESAL ) 20 MG tablet Take 1 tablet (20 mg total) by mouth once daily 30 tablet 5  . citalopram  (CELEXA ) 10 MG tablet Take 10 mg by mouth once daily    . estradiol -norethindrone (COMBIPATCH) 0.05-0.14 mg/24 hr semiwkly patch Place 1 patch onto the skin twice a week 24 patch 2  . fluticasone  propionate (FLONASE ) 50 mcg/actuation nasal spray Place 1 spray into both nostrils once daily    . glucagon  (GVOKE HYPOPEN  1-PACK) 1 mg/0.2 mL auto-injector Inject 1 mg subcutaneously    . hydrOXYzine  (ATARAX ) 25 MG tablet Take 25 mg by mouth 3 (three) times daily as needed for Anxiety or Itching    . metFORMIN  (GLUCOPHAGE -XR) 750 MG XR tablet Take 750 mg by mouth once daily    . mirtazapine  (REMERON ) 7.5 MG tablet Take 7.5 mg by mouth at bedtime    . olmesartan  (BENICAR ) 40 MG tablet TAKE 1 TABLET BY MOUTH ONCE DAILY IN PLACE OF LISINOPRIL  FOR BLOOD PRESSURE    . omega 3-dha-epa-fish oil (FISH OIL) 1,000 (120-180) mg Cap Take by mouth    . pen needle, diabetic 32 gauge x 1/4 needle Use       . TOUJEO  SOLOSTAR U-300  INSULIN  pen injector (concentration 300 units/mL) Inject 20 Units subcutaneously at bedtime    . cholecalciferol 1000 unit tablet Take by mouth (Patient not taking: Reported on 11/29/2024)    . cyanocobalamin  (VITAMIN B12) 1000 MCG tablet Take 1,000 mcg by mouth once daily (Patient not taking: Reported on 11/29/2024)    . estradiol  (DOTTI ) patch 0.075 mg/24hr Place 1 patch onto the skin twice a week (Patient not taking: Reported on 11/29/2024) 8 patch 11  . hydrOXYzine  HCL (ATARAX ) 10 MG tablet Take 10 mg by mouth (Patient not taking: Reported on 11/29/2024)    . hydrOXYzine  pamoate (VISTARIL ) 25 MG capsule Take 25-50 mg by mouth (Patient not taking: Reported on 11/29/2024)    . melatonin 10 mg Tab Take by mouth (Patient not taking: Reported on 11/29/2024)    . progesterone (PROMETRIUM) 100 MG capsule Take 1 capsule (100 mg total) by mouth at bedtime (Patient not taking: Reported on 11/29/2024) 30 capsule 11  . tirzepatide  5 mg/0.5 mL PnIj Inject 5 mg subcutaneously every 7 (seven) days Takes on Sunday (Patient not taking: Reported on 11/29/2024)    . traMADoL  (ULTRAM ) 50 mg tablet Take 1 tablet (50 mg total) by mouth 2 (two) times daily as needed 1 po bid prn (Patient not taking: Reported on 11/29/2024) 45 tablet 5   No  current facility-administered medications for this visit.    ALLERGIES Allergies  Allergen Reactions  . Trazodone  Shortness Of Breath    Patient notes difficulty breathing after taking 25 mg nightly for sleep  . Atorvastatin Muscle Pain    and pravastatin  caused muscle cramps, also simvastatin  . Compazine   [Prochlorperazine  Edisylate] Other (See Comments)    Twitching and spasms of neck muscles  . Ezetimibe  Other (See Comments)    Myalgia  . Penicillins Rash    rash  . Trazodone  Hcl Other (See Comments)    It may have caused sob     EXAM   Vitals:   11/29/24 1031  BP: 126/78  Weight: (!) 111.2 kg (245 lb 3.2 oz)  Height: 162.6 cm (5' 4)  PainSc: 0-No pain       Body mass index is 42.09 kg/m.  GENERAL: Pleasant female. No acute distress. Normocephalic and atraumatic.  Baseline neurological exam below was obtained at prior office visit. Changes from today's visit appear in bold.   MUSCULOSKELETAL: Bulk - Normal Tone - Normal Pronator Drift - Not assessed Ambulation - Gait and station is steady Romberg - Not assessed  NEUROLOGICAL: MENTAL STATUS: Patient is oriented to person, place and time.   Short-term memory is intact. Long-term memory is intact.   Attention span and concentration are intact.   Naming and repetition are intact. Comprehension is intact.   Expressive speech is intact.   Patient's fund of knowledge is within normal limits for educational level.   PAST MEDICAL HISTORY Past Medical History:  Diagnosis Date  . Bursitis of left elbow   . Depression   . Diabetes mellitus type 2, uncomplicated (CMS/HHS-HCC)   . Hyperlipidemia   . Hypertension   . Nerve pain   . PONV (postoperative nausea and vomiting)   . Sleep apnea     PAST SURGICAL HISTORY Past Surgical History:  Procedure Laterality Date  . TUBAL LIGATION  1994  . EXTRACTION CATARACT INTRACAPSULAR W/INSERTION INTRAOCULAR PROSTHESIS Right 10/27/2023   Procedure: RIGHT R-EXTRACAPSULAR CATARACT ECCE REMOVAL WITH INSERTION OF INTRAOCULAR LENS PROSTHESIS (1 STAGE PROCEDURE), MANUAL OR MECHANICAL TECHNIQUE;  Surgeon: Luke Guerry Ip, MD;  Location: [facility];  Service: Ophthalmology;  Laterality: Right;  . EXTRACTION CATARACT INTRACAPSULAR W/INSERTION INTRAOCULAR PROSTHESIS Left 11/03/2023   Procedure: LEFT L-EXTRACAPSULAR CATARACT ECCE REMOVAL WITH INSERTION OF INTRAOCULAR LENS PROSTHESIS (1 STAGE PROCEDURE), MANUAL OR MECHANICAL TECHNIQUE;  Surgeon: Luke Guerry Ip, MD;  Location: [facility];  Service: Ophthalmology;  Laterality: Left;  . CLOSURE SINUS VENOSUS    . ovary removed     Lap left ovary removed  . REDUCTION MAMMAPLASTY       FAMILY HISTORY Family History  Problem Relation Name Age of Onset  . Breast cancer Mother    . Diabetes Mother    . Gout Mother    . Diabetes Father    . Diabetes Daughter    . Breast cancer Maternal Aunt    . Stroke Maternal Uncle    . Glaucoma Neg Hx    . Macular degeneration Neg Hx      SOCIAL HISTORY  Social History   Tobacco Use  . Smoking status: Never    Passive exposure: Past  . Smokeless tobacco: Never  Vaping Use  . Vaping status: Never Used  Substance Use Topics  . Alcohol use: No  . Drug use: Never     REVIEW OF SYSTEMS:  13 system ROS was verbally reviewed with patient. Pertinent positives and negatives  are mentioned above in the HPI and all other systems are negative.   DATA    Office Visit on 10/13/2024  Component Date Value Ref Range Status  . Vitamin B12 10/13/2024 543  >300 pg/mL Final  . Vitamin D , 25-Hydroxy - [facility] 10/13/2024 16.2 (L)  30.0 - 100.0 ng/mL Final    No follow-ups on file.  Payor: [employer]HC / Plan: UNITED [employer] CHOICE PLUS / Product Type: POS /   This note is partially written by Roe Mater, in the presence of and acting as the scribe of Lauraine Rocks, PA-C.    I agree that the scribe documentation is complete and accurate.  This note was generated in part with voice recognition software and I apologize for any typographical errors that were not detected and corrected.     Attestation Statement:   I personally performed the service, non-incident to. [facility])   SARAH ELIZABETH MASON, PA       This note has been created using automated tools and reviewed for accuracy by [facility].

## 2024-11-30 ENCOUNTER — Emergency Department (HOSPITAL_COMMUNITY)

## 2024-11-30 ENCOUNTER — Encounter (HOSPITAL_COMMUNITY): Payer: Self-pay

## 2024-11-30 ENCOUNTER — Encounter: Payer: Self-pay | Admitting: Family Medicine

## 2024-11-30 ENCOUNTER — Telehealth: Payer: Self-pay | Admitting: Family Medicine

## 2024-11-30 ENCOUNTER — Emergency Department (HOSPITAL_COMMUNITY)
Admission: EM | Admit: 2024-11-30 | Discharge: 2024-12-01 | Disposition: A | Discharge status: Home / Self Care | Attending: Emergency Medicine | Admitting: Emergency Medicine

## 2024-11-30 ENCOUNTER — Other Ambulatory Visit: Payer: Self-pay

## 2024-11-30 DIAGNOSIS — R202 Paresthesia of skin: Secondary | ICD-10-CM | POA: Diagnosis not present

## 2024-11-30 DIAGNOSIS — M549 Dorsalgia, unspecified: Secondary | ICD-10-CM | POA: Diagnosis present

## 2024-11-30 DIAGNOSIS — M545 Low back pain, unspecified: Secondary | ICD-10-CM | POA: Insufficient documentation

## 2024-11-30 DIAGNOSIS — R32 Unspecified urinary incontinence: Secondary | ICD-10-CM | POA: Insufficient documentation

## 2024-11-30 DIAGNOSIS — Z7982 Long term (current) use of aspirin: Secondary | ICD-10-CM | POA: Insufficient documentation

## 2024-11-30 DIAGNOSIS — Z7984 Long term (current) use of oral hypoglycemic drugs: Secondary | ICD-10-CM | POA: Diagnosis not present

## 2024-11-30 DIAGNOSIS — Z794 Long term (current) use of insulin: Secondary | ICD-10-CM | POA: Insufficient documentation

## 2024-11-30 LAB — COMPREHENSIVE METABOLIC PANEL WITH GFR
ALT: 16 U/L (ref 0–44)
AST: 22 U/L (ref 15–41)
Albumin: 3.3 g/dL — ABNORMAL LOW (ref 3.5–5.0)
Alkaline Phosphatase: 68 U/L (ref 38–126)
Anion gap: 10 (ref 5–15)
BUN: 14 mg/dL (ref 8–23)
CO2: 25 mmol/L (ref 22–32)
Calcium: 8.8 mg/dL — ABNORMAL LOW (ref 8.9–10.3)
Chloride: 104 mmol/L (ref 98–111)
Creatinine, Ser: 0.77 mg/dL (ref 0.44–1.00)
GFR, Estimated: >60 mL/min (ref >60)
Glucose, Bld: 120 mg/dL — ABNORMAL HIGH (ref 70–99)
Potassium: 3.8 mmol/L (ref 3.5–5.1)
Sodium: 139 mmol/L (ref 135–145)
Total Bilirubin: 0.8 mg/dL (ref 0.0–1.2)
Total Protein: 6.7 g/dL (ref 6.5–8.1)

## 2024-11-30 LAB — CBC WITH DIFFERENTIAL/PLATELET
Abs Immature Granulocytes: 0.02 K/uL (ref 0.00–0.07)
Basophils Absolute: 0.1 K/uL (ref 0.0–0.1)
Basophils Relative: 1 %
Eosinophils Absolute: 0.4 K/uL (ref 0.0–0.5)
Eosinophils Relative: 5 %
HCT: 40.6 % (ref 36.0–46.0)
Hemoglobin: 12.8 g/dL (ref 12.0–15.0)
Immature Granulocytes: 0 %
Lymphocytes Relative: 32 %
Lymphs Abs: 2.5 K/uL (ref 0.7–4.0)
MCH: 26.4 pg (ref 26.0–34.0)
MCHC: 31.5 g/dL (ref 30.0–36.0)
MCV: 83.7 fL (ref 80.0–100.0)
Monocytes Absolute: 0.5 K/uL (ref 0.1–1.0)
Monocytes Relative: 6 %
Neutro Abs: 4.3 K/uL (ref 1.7–7.7)
Neutrophils Relative %: 56 %
Platelets: 280 K/uL (ref 150–400)
RBC: 4.85 MIL/uL (ref 3.87–5.11)
RDW: 13.9 % (ref 11.5–15.5)
WBC: 7.8 K/uL (ref 4.0–10.5)
nRBC: 0 % (ref 0.0–0.2)

## 2024-11-30 LAB — URINALYSIS, ROUTINE W REFLEX MICROSCOPIC
Bilirubin Urine: NEGATIVE
Glucose, UA: NEGATIVE mg/dL
Hgb urine dipstick: NEGATIVE
Ketones, ur: NEGATIVE mg/dL
Leukocytes,Ua: NEGATIVE
Nitrite: NEGATIVE
Protein, ur: NEGATIVE mg/dL
Specific Gravity, Urine: >1.03 — ABNORMAL HIGH (ref 1.005–1.030)
pH: 6 (ref 5.0–8.0)

## 2024-11-30 NOTE — ED Provider Triage Note (Signed)
 Emergency Medicine Provider Triage Evaluation Note  Lindsay Mcdonald , a 63 y.o. female  was evaluated in triage.  Pt complains of worsening back pain with associated groin anesthesia and urinary incontinence that started last night after she got back home from an MRI of her back..  Review of Systems  Positive:  Negative:   Physical Exam  BP (!) 169/91   Pulse 88   Temp 97.9 F (36.6 C) (Oral)   Resp 19   Ht 5' 4 (1.626 m)   Wt 110.2 kg   SpO2 100%   BMI 41.71 kg/m  Gen:   Awake, no distress   Resp:  Normal effort  MSK:   Moves extremities without difficulty  Other:  5 out of 5 lower extremity strength  Medical Decision Making  Medically screening exam initiated at 5:32 PM.  Appropriate orders placed.  Lindsay Mcdonald was informed that the remainder of the evaluation will be completed by another provider, this initial triage assessment does not replace that evaluation, and the importance of remaining in the ED until their evaluation is complete.     Lindsay Mcdonald DEL, PA-C 11/30/24 1737

## 2024-11-30 NOTE — ED Triage Notes (Signed)
 Pt to er, pt states that she is here for some numbness in her groin and some urinary incontent episodes.  Pt states that she has some back pain, but is ambulatory with a steady gait.  States that her md sent her here for r/o caudia equina

## 2024-11-30 NOTE — Telephone Encounter (Signed)
 Copied from CRM #[id number]. Topic: Referral - Question >> Nov 29, 2024 12:26 PM Tysheama G wrote: Reason for CRM: Patient may need a referral to a urologist. She stated she experiencing numbness when urinating. Callback number [phone]

## 2024-12-01 ENCOUNTER — Encounter: Admitting: Nurse Practitioner

## 2024-12-01 NOTE — Discharge Instructions (Signed)
 Your MRI was reassuring with no sign of cauda equina.  Your urine sample shows no sign of infection.  Please follow-up with primary care for further evaluation of your urinary incontinence.  Please continue to follow-up with pain management for further management of your back pain.  If you develop any life-threatening symptoms please return to the emergency department.

## 2024-12-01 NOTE — ED Provider Notes (Signed)
 [place] EMERGENCY DEPARTMENT AT [facility] Provider Note   CSN: [id number] Arrival date & time: 11/30/24  1714     Patient presents with: Back Pain and Numbness   Lindsay Mcdonald is a 63 y.o. female.  Patient presents to the emergency room with past medical history significant for chronic back pain, depression, obesity, muscle spasms at the recommendation of her primary care doctor for evaluation for possible cauda equina.  Patient states that for the past couple of days she has had some numbness in her groin with some intermittent urinary incontinence.  She denies fecal incontinence, urinary retention.  She denies any trauma.  She is ambulatory without any difficulty.  She had an MRI a few days ago due to chronic back pain.  She denies dysuria, abdominal pain, nausea, vomiting.  She has received injections in her spine previously by physiatry and has been referred to pain management.    Back Pain      Prior to Admission medications  Medication Sig Start Date End Date Taking? Authorizing Provider  Albuterol -Budesonide (AIRSUPRA ) 90-80 MCG/ACT AERO Inhale 2 puffs into the lungs in the morning, at noon, in the evening, and at bedtime. 07/21/24   Sowles, Krichna, MD  aspirin EC 81 MG tablet Take 81 mg by mouth daily. Swallow whole.    [provider]  baclofen  (LIORESAL ) 10 MG tablet Take 2 tablets (20 mg total) by mouth at bedtime. 08/19/24   Eappen, Saramma, MD  buprenorphine  (BUTRANS ) 5 MCG/HR PTWK Place 1 patch onto the skin once a week for 28 days. 11/29/24 12/27/24  Patel, Seema K, NP  citalopram  (CELEXA ) 10 MG tablet Take 1 tablet (10 mg total) by mouth daily with breakfast. 10/31/24   Eappen, Saramma, MD  Continuous Glucose Sensor (FREESTYLE LIBRE 3 PLUS SENSOR) MISC 1 each by Other route as directed. Change sensor every 15 days. 10/13/24   Sowles, Krichna, MD  estradiol -norethindrone [facility]) 0.05-0.14 MG/DAY Place 1 patch onto the skin. 03/03/24 03/03/25   [provider]  fluticasone  (FLONASE ) 50 MCG/ACT nasal spray Place 2 sprays into both nostrils daily as needed for allergies or rhinitis.    [provider]  GVOKE HYPOPEN  2-PACK 1 MG/0.2ML SOAJ Inject 1 mg into the skin as needed (for hypoglycemia episodes (glucose below 60 and symptomatic)). 10/08/23   Gareth Mliss FALCON, FNP  hydrOXYzine  (ATARAX ) 25 MG tablet Take 25 mg by mouth.    [provider]  hydrOXYzine  (VISTARIL ) 25 MG capsule TAKE 1 TO 2 CAPSULES BY MOUTH  DAILY AS NEEDED FOR ANXIETY 11/21/24   Eappen, Saramma, MD  icosapent  Ethyl (VASCEPA ) 1 g capsule Take 2 capsules (2 g total) by mouth 2 (two) times daily. 10/13/24   Sowles, Krichna, MD  insulin  glargine, 1 Unit Dial, (TOUJEO  SOLOSTAR) 300 UNIT/ML Solostar Pen Inject 40-50 Units into the skin every morning. 10/13/24   Sowles, Krichna, MD  Insulin  Pen Needle 32G X 6 MM MISC 1 each by Does not apply route daily at 2 PM. 11/14/24   Sowles, Krichna, MD  meclizine (ANTIVERT) 25 MG tablet  01/18/24   [provider]  Melatonin 10 MG TABS Take 10 mg by mouth at bedtime as needed (sleep).     [provider]  metFORMIN  (GLUCOPHAGE -XR) 750 MG 24 hr tablet Take 2 tablets (1,500 mg total) by mouth daily with breakfast. 11/07/24   Glenard, Krichna, MD  mirtazapine  (REMERON ) 7.5 MG tablet TAKE 1 TABLET BY MOUTH AT  BEDTIME AS NEEDED. FOR SLEEP 11/13/24  Eappen, Saramma, MD  montelukast  (SINGULAIR ) 10 MG tablet Take 1 tablet (10 mg total) by mouth at bedtime. 10/13/24   Sowles, Krichna, MD  olmesartan  (BENICAR ) 40 MG tablet Take 1 tablet (40 mg total) by mouth daily. 11/22/24   Sowles, Krichna, MD  Omega-3 1000 MG CAPS Take by mouth.    [provider]  tirzepatide  (MOUNJARO ) 15 MG/0.5ML Pen Inject 15 mg into the skin once a week. 10/13/24   Sowles, Krichna, MD  traMADol  (ULTRAM ) 50 MG tablet Take 1 tablet (50 mg total) by mouth every 12 (twelve) hours as needed for severe pain (pain score 7-10).  11/01/24 12/01/24  Patel, Seema K, NP    Allergies: Simvastatin, Trazodone  hcl, Zetia  [ezetimibe ], Atorvastatin, Compazine , and Penicillins    Review of Systems  Musculoskeletal:  Positive for back pain.    Updated Vital Signs BP (!) 158/80 (BP Location: Right Arm)   Pulse 64   Temp 98 F (36.7 C)   Resp 19   Ht 5' 4 (1.626 m)   Wt 110.2 kg   SpO2 100%   BMI 41.71 kg/m   Physical Exam Vitals and nursing note reviewed. Exam conducted with a chaperone present.  HENT:     Head: Normocephalic and atraumatic.  Eyes:     Conjunctiva/sclera: Conjunctivae normal.  Cardiovascular:     Rate and Rhythm: Normal rate and regular rhythm.  Pulmonary:     Effort: Pulmonary effort is normal. No respiratory distress.     Breath sounds: Normal breath sounds.  Abdominal:     Palpations: Abdomen is soft.     Tenderness: There is no abdominal tenderness.  Musculoskeletal:        General: Tenderness present. No signs of injury. Normal range of motion.     Cervical back: Normal range of motion.     Comments: Bilateral low back tenderness  Skin:    General: Skin is dry.  Neurological:     General: No focal deficit present.     Mental Status: She is alert and oriented to person, place, and time.     Gait: Gait normal.     Comments: Subjective paresthesia in vaginal region. No other sensory deficit appreciated  Psychiatric:        Speech: Speech normal.        Behavior: Behavior normal.     (all labs ordered are listed, but only abnormal results are displayed) Labs Reviewed  COMPREHENSIVE METABOLIC PANEL WITH GFR - Abnormal; Notable for the following components:      Result Value   Glucose, Bld 120 (*)    Calcium  8.8 (*)    Albumin 3.3 (*)    All other components within normal limits  URINALYSIS, ROUTINE W REFLEX MICROSCOPIC - Abnormal; Notable for the following components:   Specific Gravity, Urine >1.030 (*)    All other components within normal limits  CBC WITH  DIFFERENTIAL/PLATELET    EKG: None  Radiology: MR LUMBAR SPINE WO CONTRAST Result Date: 11/30/2024 EXAM: MRI LUMBAR SPINE 11/30/2024 06:47:00 PM TECHNIQUE: Multiplanar multisequence MRI of the lumbar spine was performed without the administration of intravenous contrast. COMPARISON: MRI lumbar spine 08/07/2023 CLINICAL HISTORY: Low back pain, cauda equina syndrome suspected FINDINGS: BONES AND ALIGNMENT: Normal alignment. Normal vertebral body heights. Bone marrow signal is unremarkable. SPINAL CORD: The conus terminates normally. SOFT TISSUES: No paraspinal mass. L1-L2: No significant disc herniation. No spinal canal stenosis or neural foraminal narrowing. L2-L3: No significant disc herniation. No spinal canal stenosis or neural  foraminal narrowing. L3-L4: Several mild disc bulges and a small left foraminal disc protrusion. Similar mild left foraminal stenosis. No significant canal or right foraminal stenosis. L4-L5: Similar broad-based disc bulge and bilateral facet arthropathy. Similar moderate left and mild right foraminal stenosis. No significant canal stenosis. L5-S1: Similar right foraminal disc protrusion with moderate to severe right foraminal stenosis. Similar mild left foraminal stenosis. Significant canal stenosis. IMPRESSION: 1. At L5-S1, similar right foraminal disc protrusion with moderate to severe right foraminal stenosis. 2. Similar moderate left foraminal size at L4-L5 3. Similar mild left foraminal stenosis at L3-L4. 4. No significant canal stenosis. Electronically signed by: Gilmore Molt MD 11/30/2024 07:27 PM EST RP Workstation: [id number]   MR LUMBAR SPINE WO CONTRAST Result Date: 11/29/2024 CLINICAL DATA:  Back pain and sciatica.  No other history provided. EXAM: MRI LUMBAR SPINE WITHOUT CONTRAST TECHNIQUE: Multiplanar, multisequence MR imaging of the lumbar spine was performed. No intravenous contrast was administered. COMPARISON:  Prior lumbar spine MRI 08/07/2023 FINDINGS:  Segmentation: There are five lumbar type vertebral bodies. The last full intervertebral disc space is labeled L5-S1. This correlates with the prior MRI. Alignment:  Normal Vertebrae:  Normal marrow signal.  No bone lesions or fractures. Conus medullaris and cauda equina: Conus extends to the L1 level. Conus and cauda equina appear normal. Paraspinal and other soft tissues: No significant paraspinal or retroperitoneal findings. Disc levels: T12-L1: No significant findings. L1-2: Mild facet disease but no disc protrusions, spinal or foraminal stenosis. L2-3: Mild facet disease but no disc protrusions, spinal or foraminal stenosis. L3-4: Moderate facet disease. No spinal or lateral recess stenosis. There is a shallow broad-based left foraminal and extraforaminal disc protrusion contacting and slightly displacing the left L3 nerve root. This appears progressive when compared to the prior study. L4-5: Diffuse bulging annulus and moderate facet disease contributing to mild bilateral lateral recess encroachment. No significant spinal stenosis. There is a shallow broad-based left foraminal and extraforaminal disc protrusion contacting and slightly displacing the left L4 nerve root. This appears slightly progressive when compared to the prior study. L5-S1 stable moderate to advanced facet disease and ligamentum flavum thickening but no spinal stenosis. Moderate-sized right foraminal and extraforaminal disc protrusion contacting and displacing right L5 nerve root. This is a stable finding when compared to the prior study. IMPRESSION: 1. Shallow broad-based left foraminal and extraforaminal disc protrusion at L3-4 contacting and slightly displacing the left L3 nerve root. This appears progressive when compared to the prior study. 2. Shallow broad-based left foraminal and extraforaminal disc protrusion at L4-5 contacting and slightly displacing the left L4 nerve root. This appears slightly progressive when compared to the prior  study. 3. Stable moderate-sized right foraminal and extraforaminal disc protrusion at L5-S1 contacting and displacing right L5 nerve root. Electronically Signed   By: MYRTIS Stammer M.D.   On: 11/29/2024 21:35     Procedures   Medications Ordered in the ED - No data to display                                  Medical Decision Making  This patient presents to the ED for concern of low back pain, vaginal numbness, urinary incontinence, this involves an extensive number of treatment options, and is a complaint that carries with it a high risk of complications and morbidity.  The differential diagnosis includes cauda equina, urinary frequency, urinary tract infection, diabetes, neuropathy, fracture, dislocation, others   Co  morbidities / Chronic conditions that complicate the patient evaluation  As noted in HPI   Additional history obtained:  Additional history obtained from EMR External records from outside source obtained and reviewed including neurology notes   Lab Tests:  I Ordered, and personally interpreted labs.  The pertinent results include: Grossly unremarkable CMP, CBC, UA   Imaging Studies ordered:  I ordered imaging studies including MR lumbar spine I independently visualized and interpreted imaging which showed  1. At L5-S1, similar right foraminal disc protrusion with moderate to severe  right foraminal stenosis.  2. Similar moderate left foraminal size at L4-L5  3. Similar mild left foraminal stenosis at L3-L4.  4. No significant canal stenosis.   I agree with the radiologist interpretation    Test / Admission - Considered:  Patient with no MRI findings to explain patient's symptoms.  MRI not consistent with cauda equina.  Patient states she is not interested in surgery and will continue to follow-up with pain management for further back pain management.  She will follow-up with primary care and urology for evaluation of urinary frequency.  No indication for  further emergent workup or admission.  Patient stable for discharge home.      Final diagnoses:  Low back pain without sciatica, unspecified back pain laterality, unspecified chronicity  Urinary incontinence, unspecified type    ED Discharge Orders     None          Logan Ubaldo KATHEE DEVONNA 12/01/24 [id number]    Raford Lenis, MD 12/01/24 [phone]

## 2024-12-04 ENCOUNTER — Encounter: Payer: Self-pay | Admitting: Family Medicine

## 2024-12-09 ENCOUNTER — Other Ambulatory Visit: Payer: Self-pay | Admitting: Family Medicine

## 2024-12-09 ENCOUNTER — Ambulatory Visit: Admitting: Professional Counselor

## 2024-12-09 DIAGNOSIS — E1169 Type 2 diabetes mellitus with other specified complication: Secondary | ICD-10-CM

## 2024-12-12 ENCOUNTER — Ambulatory Visit: Admitting: Psychiatry

## 2024-12-13 NOTE — Telephone Encounter (Signed)
 Requested Prescriptions  Pending Prescriptions Disp Refills   TOUJEO  SOLOSTAR 300 UNIT/ML Solostar Pen [Pharmacy Med Name: Toujeo  SoloStar 300 UNIT/ML Subcutaneous Solution Pen-injector] 4.5 mL 0    Sig: INJECT 40 TO 50 UNITS INTO THE  SKIN EVERY MORNING     Endocrinology:  Diabetes - Insulins Passed - 12/13/2024  1:43 PM      Passed - HBA1C is between 0 and 7.9 and within 180 days    Hemoglobin A1C  Date Value Ref Range Status  10/13/2024 7.2 (A) 4.0 - 5.6 % Final   HbA1c, POC (prediabetic range)  Date Value Ref Range Status  12/31/2018 7.0 (A) 5.7 - 6.4 % Final   HbA1c, POC (controlled diabetic range)  Date Value Ref Range Status  09/28/2019 7.9 (A) 0.0 - 7.0 % Final   Hgb A1c MFr Bld  Date Value Ref Range Status  02/19/2022 7.4 (H) <5.7 % of total Hgb Final    Comment:    For someone without known diabetes, a hemoglobin A1c value of 6.5% or greater indicates that they may have  diabetes and this should be confirmed with a follow-up  test. . For someone with known diabetes, a value <7% indicates  that their diabetes is well controlled and a value  greater than or equal to 7% indicates suboptimal  control. A1c targets should be individualized based on  duration of diabetes, age, comorbid conditions, and  other considerations. . Currently, no consensus exists regarding use of hemoglobin A1c for diagnosis of diabetes for children. SABRA Amy - Valid encounter within last 6 months    Recent Outpatient Visits           2 months ago Dyslipidemia associated with type 2 diabetes mellitus [facility])   [place] [facility] [place], [place]te, MD   4 months ago Dyslipidemia associated with type 2 diabetes mellitus [facility])   [place] [facility] [place], MD   6 months ago Dyslipidemia associated with type 2 diabetes mellitus [facility])   [facility] Sowles, Krichna, MD   7 months ago Diabetic peripheral  neuropathy associated with type 2 diabetes mellitus [facility])   [facility] Sowles, Krichna, MD   8 months ago Obesity, diabetes, and hypertension syndrome [facility])   [facility] Sowles, Krichna, MD

## 2024-12-16 ENCOUNTER — Ambulatory Visit: Admitting: Professional Counselor

## 2024-12-18 ENCOUNTER — Other Ambulatory Visit: Payer: Self-pay | Admitting: Psychiatry

## 2024-12-18 DIAGNOSIS — F331 Major depressive disorder, recurrent, moderate: Secondary | ICD-10-CM

## 2024-12-18 DIAGNOSIS — F411 Generalized anxiety disorder: Secondary | ICD-10-CM

## 2024-12-20 ENCOUNTER — Other Ambulatory Visit: Payer: Self-pay | Admitting: Family Medicine

## 2024-12-20 ENCOUNTER — Telehealth: Payer: Self-pay

## 2024-12-20 DIAGNOSIS — E119 Type 2 diabetes mellitus without complications: Secondary | ICD-10-CM

## 2024-12-20 DIAGNOSIS — E1169 Type 2 diabetes mellitus with other specified complication: Secondary | ICD-10-CM

## 2024-12-20 NOTE — Telephone Encounter (Signed)
 Patient advised that it may be ok to take Ibuprofen, but not Tramadol .  After reviewing the last office note, the Butrans  patch was intended to replace the Tramadol .

## 2024-12-20 NOTE — Telephone Encounter (Signed)
 She is on the pain patch but wants to know if she can also take tramadol  or ibuprofen.

## 2024-12-21 ENCOUNTER — Encounter: Payer: Self-pay | Admitting: Family Medicine

## 2024-12-21 NOTE — Telephone Encounter (Signed)
 Requested medication (s) are due for refill today: yes  Requested medication (s) are on the active medication list: yes  Last refill:  10/13/24  Future visit scheduled: yes  Notes to clinic:  Medication not assigned to a protocol, review manually.      Requested Prescriptions  Pending Prescriptions Disp Refills   MOUNJARO  15 MG/0.5ML Pen [Pharmacy Med Name: MOUNJARO  PEN 15MG /0.5ML] 6 mL 3    Sig: INJECT THE CONTENTS OF ONE PEN  SUBCUTANEOUSLY WEEKLY AS  DIRECTED     Off-Protocol Failed - 12/21/2024  1:23 PM      Failed - Medication not assigned to a protocol, review manually.      Passed - Valid encounter within last 12 months    Recent Outpatient Visits           2 months ago Dyslipidemia associated with type 2 diabetes mellitus [facility])   [place] [facility] [place], Dorette, MD   5 months ago Dyslipidemia associated with type 2 diabetes mellitus [facility])   [place] [facility] [place] D[place], MD   6 months ago Dyslipidemia associated with type 2 diabetes mellitus [facility])   [place] [facility] Sowles, Krichna, MD   8 months ago Diabetic peripheral neuropathy associated with type 2 diabetes mellitus [facility])   [place] [facility] [place] Sowles, Krichna, MD   8 months ago Obesity, diabetes, and hypertension syndrome [facility])   [facility] Sowles, Krichna, MD

## 2024-12-23 ENCOUNTER — Encounter

## 2024-12-26 ENCOUNTER — Ambulatory Visit: Admitting: Professional Counselor

## 2024-12-26 ENCOUNTER — Other Ambulatory Visit (HOSPITAL_COMMUNITY): Payer: Self-pay

## 2024-12-26 DIAGNOSIS — F411 Generalized anxiety disorder: Secondary | ICD-10-CM

## 2024-12-26 DIAGNOSIS — F331 Major depressive disorder, recurrent, moderate: Secondary | ICD-10-CM | POA: Diagnosis not present

## 2024-12-26 DIAGNOSIS — Z634 Disappearance and death of family member: Secondary | ICD-10-CM

## 2024-12-26 NOTE — Progress Notes (Signed)
 " THERAPIST PROGRESS NOTE  Virtual Visit via Video Note  I connected with [name] on 12/26/2024 at 10:00 AM EST by a video enabled telemedicine application and verified that I am speaking with the correct person using two identifiers.  Location: Patient: Home Provider: Remote office   I discussed the limitations of evaluation and management by telemedicine and the availability of in person appointments. The patient expressed understanding and agreed to proceed.   I discussed the assessment and treatment plan with the patient. The patient was provided an opportunity to ask questions and all were answered. The patient agreed with the plan and demonstrated an understanding of the instructions.   The patient was advised to call back or seek an in-person evaluation if the symptoms worsen or if the condition fails to improve as anticipated.  I provided 54 minutes of non-face-to-face time during this encounter. [name], [facility]  Session Time: 10:00 AM - 10:54 AM   Participation Level: Active  Behavioral Response: Casual, Alert, Dysphoric  Type of Therapy: Individual Therapy  Treatment Goals addressed:  Active OP Depression  LTG: I'd like to be able to make decisions and have clarity and feel good about my decisions. I'd like to also feel better with relationships. How to manage stress.  (Progressing)                Start:  05/25/24    Expected End:  05/24/25    Goal Note Reviewed 09/06/24 Still need, not where I want to be, just trying to put it all together.   STG: Stress management. To improve stress management AEB reduction in mood symptoms by practicing coping skills over the next 12 weeks (Progressing)  Goal Note Reviewed 09/06/24 I think a little better. I don't know if it's due to the medication and overeating, but that's not coping, but still working on it. Some things you mentioned today I can try to implement.   STG: Self-care, self-love, I'm kind of hard  on myself. To improve sense of self AEB self-report in increase of self-care activities and restructuring maladaptive patterns of thinking over the next 90 days.  (Progressing)  Goal Note Reviewed 09/06/24 Like we discussed today, the acceptance part is the key, if I can get there.   STG: I just want to be able to control that more. (Bringing up graphic images like traumatic things seen on TV). To reduce distress brought on by OCD tendencies AEB self-report reduction in obsessive and compulsive behaviors with exposure and CBT.  (Progressing)  Goal Note Reviewed 09/06/24 A little bit better. I think when you're busy that helps distract some, but when you're by yourself, that still needs assistance.  ProgressTowards Goals: Progressing  Interventions: CBT and Supportive  Summary: Kabella Cassidy is a 64 y.o. female who presents with a history of anxiety and depression. Additionally, she is dealing with bereavement since her mother passed away in Dec 28, 2025. Alisa appeared dysphoric but oriented x5. She shared the details of her mother's passing and funeral services. She noted thoughts and feelings she's been struggling with. Alisa discussed updates with her neighbor situation. She is waiting for a consultation about tenant laws. She is trying to look forward to moving once their lease is up in May. Rayna reported work is going the same. She is still trying to become more assertive in a way she feels good about, while accommodating the company and the clients.   Therapist Response: Conducted session with Alisa. Began session with check-in/update since  previous session. Utilized empathetic and reflective listening. Used open-ended questions to facilitate discussion and summarized Helena's thoughts/feelings. Normalized the grieving process and discuss stages of grief. Encouraged Alisa to find balance in things and use and to allow space for conflicting thoughts/emotions. 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 Scheduled additional appointment and concluded session.   Suicidal/Homicidal: No  Plan: Return again in 1 weeks.  Diagnosis: Major depressive disorder, recurrent episode, moderate (HCC)  Generalized anxiety disorder  Bereavement  Collaboration of Care: Medication Management AEB chart review  Patient/Guardian was advised Release of Information must be obtained prior to any record release in order to collaborate their care with an outside provider. Patient/Guardian was advised if they have not already done so to contact the registration department to sign all necessary forms in order for us  to release information regarding their care.   Consent: Patient/Guardian gives verbal consent for treatment and assignment of benefits for services provided during this visit. Patient/Guardian expressed understanding and agreed to proceed.   [name], [facility] 12/26/2024  "

## 2024-12-28 ENCOUNTER — Ambulatory Visit: Payer: Self-pay | Admitting: Family Medicine

## 2024-12-28 NOTE — Progress Notes (Signed)
 PROVIDER NOTE: Interpretation of information contained herein should be left to medically-trained personnel. Specific patient instructions are provided elsewhere under Patient Instructions section of medical record. This document was created in part using AI and STT-dictation technology, any transcriptional errors that may result from this process are unintentional.  Patient: Lindsay Mcdonald  Service: E/M   PCP: Sowles, Krichna, MD  DOB: 04-Aug-1961  DOS: 12/29/2024  Provider: Emmy MARLA Blanch, NP  MRN: [id number]  Delivery: Face-to-face  Specialty: Interventional Pain Management  Type: Established Patient  Setting: Ambulatory outpatient facility  Specialty designation: 09  Referring Prov.: Sowles, Krichna, MD  Location: Outpatient office facility       History of present illness (HPI) [name], a 64 y.o. year old female, is here today because of her Chronic pain of both shoulders [M25.511, G89.29, M25.512]. [name] primary complain today is Shoulder Pain  Pertinent problems: Lindsay Mcdonald has Primary osteoarthritis of right knee; Depression, major, recurrent, mild (HCC); Morbid obesity with BMI of 40.0-44.9, adult (HCC); Vitamin D  deficiency; Fibromyalgia; History of marijuana use; Dyslipidemia associated with type 2 diabetes mellitus (HCC); Herniated intervertebral disc of lumbar spine; Major depressive disorder, recurrent episode, moderate (HCC); Lumbar spondylosis; Chronic radicular lumbar pain; Rotator cuff arthropathy of both shoulders (left>right); Chronic pain syndrome; and Bilateral shoulder pain on their pertinent problem list.  Pain Assessment: Severity of Chronic pain is reported as a 7 /10. Location: Shoulder Right, Left/None today. Onset: More than a month ago. Quality: Sharp. Timing: Intermittent. Modifying factor(s): Ibuprofen helps. Vitals:  height is 5' 4 (1.626 m) and weight is 243 lb (110.2 kg). Her temporal temperature is 97 F (36.1 C) (abnormal). Her blood  pressure is 154/83 (abnormal) and her pulse is 79. Her respiration is 18 and oxygen saturation is 98%.  BMI: Estimated body mass index is 41.71 kg/m as calculated from the following:   Height as of this encounter: 5' 4 (1.626 m).   Weight as of this encounter: 243 lb (110.2 kg).  Last encounter: 11/29/2024. Last procedure: Visit date not found.  Reason for encounter: evaluation for possible interventional PM therapy/treatment and medication management. No change in medical history since last visit.  Patient's pain is at baseline.  Patient continues multimodal pain regimen as prescribed.  States that it provides pain relief and improvement in functional status.   Discussed the use of AI scribe software for clinical note transcription with the patient, who gave verbal consent to proceed.  History of Present Illness   Lindsay Mcdonald is a 64 year old female with bilateral shoulder pain and arthritis who presents for pain management and interventional treatment for her chronic shoulder pain.  She experiences sharp, intermittent pain primarily in her shoulder blades. The pain is bilateral and has been present for less than three months, although she has had shoulder issues since tearing her rotator cuffs. The pain can be aggravated by weather changes.  She has a history of receiving shoulder injections from orthopedics in the past, but not at the current clinic. An x-ray of the shoulders was performed in July 2025. She received a steroid injection at the [place] [facility], which provided initial relief but the pain returned after a short period, the pain return to baseline.   She also mentions having a lumbar facet block in August, which has helped her low back pain unless it is aggravated. She takes ibuprofen for occasional stiffness in her back.  She is currently using a Butrans  patch for pain management. No current headaches, but  sometimes experiences pain around the head associated with shoulder  pain.     Pharmacotherapy Assessment   Butrans  7.5 mcg patch, once a week. Monitoring: [place] PMP: PDMP reviewed during this encounter.       Pharmacotherapy: No side-effects or adverse reactions reported. Compliance: No problems identified. Effectiveness: Clinically acceptable.  Lindsay Mcdonald, Lindsay Mcdonald  12/29/2024  9:49 AM  Sign when Signing Visit Nursing Pain Medication Assessment:  Safety precautions to be maintained throughout the outpatient stay will include: orient to surroundings, keep bed in low position, maintain call bell within reach at all times, provide assistance with transfer out of bed and ambulation.  Medication Inspection Compliance: Pill count conducted under aseptic conditions, in front of the patient. Neither the pills nor the bottle was removed from the patient's sight at any time. Once count was completed pills were immediately returned to the patient in their original bottle.  Medication: Tramadol  (Ultram ) Pill/Patch Count: 52 of 30 pills/patches remain Pill/Patch Appearance: Markings consistent with prescribed medication Bottle Appearance: Standard pharmacy container. Clearly labeled. Filled Date: 64 / 60 / 2025 Last Medication intake:  been a while    UDS:  Summary  Date Value Ref Range Status  10/20/2024 FINAL  Final    Comment:    ==================================================================== Compliance Drug Analysis, Ur ==================================================================== Test                             Result       Flag       Units  Drug Present and Declared for Prescription Verification   Baclofen                        PRESENT      EXPECTED   Mirtazapine                     PRESENT      EXPECTED   Vilazodone                      PRESENT      EXPECTED   Naproxen                        PRESENT      EXPECTED   Hydroxyzine                     PRESENT      EXPECTED  Drug Absent but Declared for Prescription Verification   Tramadol                         Not Detected UNEXPECTED ng/mg creat   Salicylate                     Not Detected UNEXPECTED    Aspirin, as indicated in the declared medication list, is not always    detected even when used as directed.    Ibuprofen                      Not Detected UNEXPECTED    Ibuprofen, as indicated in the declared medication list, is not    always detected even when used as directed.  ==================================================================== Test                      Result    Flag   Units  Ref Range   Creatinine              169              mg/dL      >=79 ==================================================================== Declared Medications:  The flagging and interpretation on this report are based on the  following declared medications.  Unexpected results may arise from  inaccuracies in the declared medications.   **Note: The testing scope of this panel includes these medications:   Baclofen  (Lioresal )  Hydroxyzine  (Vistaril )  Mirtazapine  (Remeron )  Naproxen  (Aleve )  Tramadol  (Ultram )  Vilazodone    **Note: The testing scope of this panel does not include small to  moderate amounts of these reported medications:   Aspirin  Ibuprofen (Advil)   **Note: The testing scope of this panel does not include the  following reported medications:   Albuterol  (Airsupra )  Budesonide (Airsupra )  Estradiol  (Combipatch)  Fluticasone  (Flonase )  Glucagon  (Gvoke)  Icosapent  (Vascepa )  Insulin  (Toujeo )  Meclizine (Antivert)  Melatonin  Metformin   Montelukast  (Singulair )  Norethindrone (Combipatch)  Olmesartan  (Benicar )  Tirzepatide  (Mounjaro ) ==================================================================== For clinical consultation, please call [phone]. ====================================================================     No results found for: CBDTHCR No results found for: D8THCCBX No results found for: D9THCCBX  ROS  Constitutional:  Denies any fever or chills Gastrointestinal: No reported hemesis, hematochezia, vomiting, or acute GI distress Musculoskeletal: Bilateral shoulder pain Neurological: No reported episodes of acute onset apraxia, aphasia, dysarthria, agnosia, amnesia, paralysis, loss of coordination, or loss of consciousness  Medication Review  Albuterol -Budesonide, FreeStyle Libre 3 Plus Sensor, Glucagon , Insulin  Pen Needle, Melatonin, Omega-3, aspirin EC, baclofen , buprenorphine , citalopram , estradiol -norethindrone, fluticasone , hydrOXYzine , icosapent  Ethyl, insulin  glargine (1 Unit Dial), meclizine, metFORMIN , mirtazapine , montelukast , olmesartan , and tirzepatide   History Review  Allergy: Lindsay Mcdonald is allergic to simvastatin, trazodone  hcl, zetia  [ezetimibe ], atorvastatin, compazine , and penicillins. Drug: Lindsay Mcdonald  reports no history of drug use. Alcohol:  reports no history of alcohol use. Tobacco:  reports that she has never smoked. She has never used smokeless tobacco. Social: Lindsay Mcdonald  reports that she has never smoked. She has never used smokeless tobacco. She reports that she does not drink alcohol and does not use drugs. Medical:  has a past medical history of Anxiety, Arthritis (03/2020), Chronic pain of right knee, Chronic sinusitis, DDD (degenerative disc disease), cervical, Depression, Diabetes mellitus, Fibromyalgia, GERD (gastroesophageal reflux disease), High cholesterol, Hyperlipidemia, Hypertension, Insomnia, Muscle pain, Nasal polyp, Neuromuscular disorder (HCC), Obesity, Palpitations, Tendonitis of knee, left (04/2020), Vitamin D  deficiency, and Vitamin D  deficiency. 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 Family: family history includes Alcohol abuse in her daughter and daughter; Breast cancer (age of onset: 5) in her maternal aunt; Breast cancer (age of onset: 95) in her mother; Cancer (age of onset: 14) in her half-sister; Cancer (age of onset: 52) in her mother; Dementia in her mother; Diabetes in her daughter, father, and mother; Gout in her mother; Kidney cancer in her maternal aunt; Kidney disease in her maternal aunt; Stroke in her maternal uncle.  Laboratory Chemistry Profile   Renal Lab Results  Component Value Date   BUN 14 11/30/2024   CREATININE 0.77 11/30/2024   LABCREA 164 05/27/2024   BCR 23  06/17/2024   GFRAA 93 02/22/2021   GFRNONAA >60 11/30/2024    Hepatic Lab Results  Component Value Date   AST 22 11/30/2024   ALT 16 11/30/2024   ALBUMIN 3.3 (L) 11/30/2024   ALKPHOS 68 11/30/2024   LIPASE 49 02/14/2024    Electrolytes Lab Results  Component Value Date   NA 139 11/30/2024   K 3.8 11/30/2024   CL 104 11/30/2024   CALCIUM  8.8 (L) 11/30/2024   MG 1.9 08/13/2022    Bone Lab Results  Component Value Date   VD25OH 22 (L) 06/27/2016    Inflammation (CRP: Acute Phase) (ESR: Chronic Phase) Lab Results  Component Value Date   ESRSEDRATE 2 07/20/2012         Note: Above Lab results reviewed.  Recent Imaging Review  MR LUMBAR SPINE WO CONTRAST EXAM: MRI LUMBAR SPINE 11/30/2024 06:47:00 PM  TECHNIQUE: Multiplanar multisequence MRI of the lumbar spine was performed without the administration of intravenous contrast.  COMPARISON: MRI lumbar spine 08/07/2023  CLINICAL HISTORY: Low back pain, cauda equina syndrome suspected  FINDINGS:  BONES AND ALIGNMENT: Normal alignment. Normal vertebral body heights. Bone marrow signal is unremarkable.  SPINAL CORD: The conus terminates  normally.  SOFT TISSUES: No paraspinal mass.  L1-L2: No significant disc herniation. No spinal canal stenosis or neural foraminal narrowing.  L2-L3: No significant disc herniation. No spinal canal stenosis or neural foraminal narrowing.  L3-L4: Several mild disc bulges and a small left foraminal disc protrusion. Similar mild left foraminal stenosis. No significant canal or right foraminal stenosis.  L4-L5: Similar broad-based disc bulge and bilateral facet arthropathy. Similar moderate left and mild right foraminal stenosis. No significant canal stenosis.  L5-S1: Similar right foraminal disc protrusion with moderate to severe right foraminal stenosis. Similar mild left foraminal stenosis. Significant canal stenosis.  IMPRESSION: 1. At L5-S1, similar right foraminal disc protrusion with moderate to severe right foraminal stenosis. 2. Similar moderate left foraminal size at L4-L5 3. Similar mild left foraminal stenosis at L3-L4. 4. No significant canal stenosis.  Electronically signed by: Gilmore Molt MD 11/30/2024 07:27 PM EST RP Workstation: [id number] Note: Reviewed        Narrative  EXAM: Left Shoulder Radiographs - 3 views (Grashey, Axillary, Scapular Y) performed 07/19/2024  CLINICAL INFORMATION: Chronic left shoulder pain  COMPARISON: None  FINDINGS:   Type I acromion.  Mild-moderate AC joint arthritis change with joint space narrowing, osteophyte formation.  Mild glenohumeral arthritis change with joint space narrowing.  Mild-moderate enthesopathic changes at the inferior acromion and rotator cuff attachment.  The distal clavicle, scapula, and proximal humerus are intact.  Glenohumeral and acromioclavicular joint alignment appears normal.  No fractures or dislocations.  No soft tissue swelling or joint effusion.  No destructive bony lesion is identified. Exam End: 07/19/24 10:14 Last Resulted: 07/19/24 11:52  Received From: [facility]   Result Received: 10/17/24 07:35   Narrative  EXAM: Right Shoulder Radiographs - 3 views (Grashey, Axillary, Scapular Y) performed 07/19/2024  CLINICAL INFORMATION: Chronic right shoulder pain  COMPARISON: Right Shoulder Radiographs - 3 views (Grashey, Axillary, Scapular Y) performed 07/30/2021  FINDINGS:   Stable appearance to previous x-ray findings.  Type II acromion.   Calcification and enthesopathic change to the lateral acromion could represent os acromiale or chronic enthesopathic change to the deltoid attachment.  Moderate AC joint degenerative change with joint space narrowing, osteophyte formation, subchondral cystic change.  The distal clavicle, scapula, and proximal humerus are intact.  Glenohumeral and acromioclavicular joint  alignment appears normal.  No fractures or dislocations.  No obvious glenohumeral joint degenerative changes.  No soft tissue swelling or joint effusion.  No destructive bony lesion is identified.  Minimal enthesopathic change to the rotator cuff attachment. Exam End: 07/19/24 10:13 Last Resulted: 07/19/24 11:49  Received From: [facility]  Result Received: 08/03/24 15:59   Physical Exam  Vitals: BP (!) 154/83 (BP Location: Right Arm, Patient Position: Sitting, Cuff Size: Normal)   Pulse 79   Temp (!) 97 F (36.1 C) (Temporal)   Resp 18   Ht 5' 4 (1.626 m)   Wt 243 lb (110.2 kg)   SpO2 98%   BMI 41.71 kg/m  BMI: Estimated body mass index is 41.71 kg/m as calculated from the following:   Height as of this encounter: 5' 4 (1.626 m).   Weight as of this encounter: 243 lb (110.2 kg). Ideal: Ideal body weight: 54.7 kg (120 lb 9.5 oz) Adjusted ideal body weight: 76.9 kg (169 lb 8.9 oz) General appearance: Well nourished, well developed, and well hydrated. In no apparent acute distress Mental status: Alert, oriented x 3 (person, place, & time)       Respiratory: No evidence of acute respiratory distress Eyes:  PERLA  Musculoskeletal: Bilateral shoulder pain Upper Extremity (UE) Exam      Right  Left  Inspection    Skin color, temperature, and hair growth are WNL. No peripheral edema or cyanosis. No masses, redness, swelling, asymmetry, or associated skin lesions. No contractures.  Skin color, temperature, and hair growth are WNL. No peripheral edema or cyanosis. No masses, redness, swelling, asymmetry, or associated skin lesions. No contractures.          Functional ROM    Pain restricted ROM for shoulder  Pain restricted ROM for shoulder          Muscle Tone/Strength    Functionally intact. No obvious neuro-muscular anomalies detected.  Functionally intact. No obvious neuro-muscular anomalies detected.          Sensory (Neurological)    Musculoskeletal pain pattern affecting the shoulder  Musculoskeletal pain pattern affecting the shoulder          Palpation    No palpable anomalies              No palpable anomalies                      Maneuver Shoulder abduction (deltoid/supraspinatus, axillary/supra scapular n,, C5) Elbow flexion (biceps brachial, musculoskeletal n, C5-6) Elbow extension (triceps, radial n, C7) Finger abduction (interossei, ulnar n, T1)    Shoulder abduction (deltoid/supraspinatus, axillary/supra scapular n,, C5) Elbow flexion (biceps brachial, musculoskeletal n, C5-6) Elbow extension (triceps, radial n, C7) Wrist extensors (C6) Finger extensors (C8) Finger abduction (interossei, ulnar n, T1)            Provocative Test    Phalen's test: deferred Tinel's test: deferred Apley's scratch test (touch opposite shoulder):  Action 1 (Across chest): Decreased ROM Action 2 (Overhead): Decreased ROM Action 3 (LB reach): Decreased ROM  Phalen's test: deferred Tinel's test: deferred Apley's scratch test (touch opposite shoulder):  Action 1 (Across chest): Decreased ROM Action 2 (Overhead): Decreased ROM Action 3 (LB reach): Decreased ROM             Level  Myotome   Dermatome  Sclerotome  ROM  C5  Elbow flexion  Lateral upper arm      C6  Wrist extension  Thumb  and index      C7  Elbow extension  Middle finger      C8  Finger extension  Ring and pinky finger      T1  Finger abduction  Medial elbow and axilla                                                                                                                                         Assessment   Diagnosis Status  1. Chronic pain of both shoulders   2. Rotator cuff arthropathy of both shoulders (left>right)   3. Medication management   4. Primary osteoarthritis of right knee   5. Lumbar facet arthropathy   6. Lumbar spondylosis   7. Chronic radicular lumbar pain (right L5/S1 disc herniation)   8. Fibromyalgia   9. Chronic pain syndrome    Persistent Persistent Controlled   Updated Problems: Problem  Bilateral Shoulder Pain    Plan of Care  Problem-specific:  Assessment and Plan    Bilateral shoulder pain due to rotator cuff arthropathy and AC joint osteoarthritis Chronic bilateral shoulder pain with mild to moderate AC joint arthritis. Previous steroid injections provided temporary relief. She prefers to avoid surgery. - Scheduled bilateral shoulder injections in two weeks. - Increased Butrans  patch to 7.5 mcg/hr with two refills. - Informed Doctor Latif about potential increase in glucose levels due to steroid injections.  Primary osteoarthritis of right knee Previous steroid injection by Doctor Lazarus provided good relief.  Lumbar spondylosis with facet arthropathy Lumbar facet block performed in August. Low back pain well-managed unless aggravated. - Continue ibuprofen as needed for stiffness.  Chronic pain management Current regimen includes Butrans  patch. Plan to increase dosage for better pain control. Coordination with Doctor Latif for diabetes management due to potential steroid effects on glucose levels. - Increased Butrans  patch to 7.5 mcg/hr with two  refills.  Patient's pain is controlled with butrans  patch, will continue on current medication regimen. Prescribing drug monitoring (PDMP) reviewed, findings consistent with the use of prescribed medication.  Urine drug screening (UDS) up to date and consistent with the use of prescribed medication.  Schedule follow-up in 30 days for medication management.   Plan: (Clinic): (B/L) Shoulder injection # 1 with Dr. Marcelino       Lindsay Mcdonald has a current medication list which includes the following long-term medication(s): citalopram , estradiol -norethindrone, metformin , mirtazapine , montelukast , olmesartan , toujeo  solostar, and icosapent  ethyl.  Pharmacotherapy (Medications Ordered): Meds ordered this encounter  Medications   buprenorphine  (BUTRANS ) 7.5 MCG/HR    Sig: Place 1 patch onto the skin once a week.    Dispense:  4 patch    Refill:  2    Chronic Pain: STOP Act (Not applicable) Fill 1 day early if closed on refill date. Avoid benzodiazepines within 8 hours of opioids   Orders:  Orders Placed This Encounter  Procedures   SHOULDER INJECTION  Standing Status:   Future    Expiration Date:   03/29/2025    Scheduling Instructions:     Procedure: Intra-articular shoulder (Glenohumeral) joint and (AC) Acromioclavicular joint injection     Side: Bilateral     Level: Glenohumeral joint and (AC) Acromioclavicular joint     Sedation: Patient's choice.     Timeframe: As permitted by the schedule    Where will this procedure be performed?:   [facility]      Return in about 2 weeks (around 01/12/2025) for [facility]): (B/L) Shoulder injection # 1 with Dr. Marcelino .    Recent Visits Date Type Provider Dept  11/29/24 Office Visit Cortne Amara K, NP [facility]  11/09/24 Procedure visit Marcelino Nurse, MD [facility]  11/01/24 Office Visit Remy Dia K, NP [facility]  10/20/24 Office Visit Marcelino Nurse, MD [facility]  Showing recent  visits within past 90 days and meeting all other requirements Today's Visits Date Type Provider Dept  12/29/24 Office Visit Jonell Krontz K, NP [facility]  Showing today's visits and meeting all other requirements Future Appointments Date Type Provider Dept  01/18/25 Appointment Marcelino Nurse, MD [facility]  03/23/25 Appointment Kamau Weatherall K, NP [facility]  Showing future appointments within next 90 days and meeting all other requirements  I discussed the assessment and treatment plan with the patient. The patient was provided an opportunity to ask questions and all were answered. The patient agreed with the plan and demonstrated an understanding of the instructions.  Patient advised to call back or seek an in-person evaluation if the symptoms or condition worsens.  I personally spent a total of 30 minutes in the care of the patient today including preparing to see the patient, getting/reviewing separately obtained history, performing a medically appropriate exam/evaluation, counseling and educating, placing orders, referring and communicating with other health care professionals, documenting clinical information in the EHR, independently interpreting results, communicating results, and coordinating care.   Note by: Almee Pelphrey K Daphney Hopke, NP (TTS and AI technology used. I apologize for any typographical errors that were not detected and corrected.) Date: 12/29/2024; Time: 11:07 AM

## 2024-12-29 ENCOUNTER — Ambulatory Visit: Attending: Nurse Practitioner | Admitting: Nurse Practitioner

## 2024-12-29 ENCOUNTER — Encounter: Payer: Self-pay | Admitting: Nurse Practitioner

## 2024-12-29 VITALS — BP 154/83 | HR 79 | Temp 97.0°F | Resp 18 | Ht 64.0 in | Wt 243.0 lb

## 2024-12-29 DIAGNOSIS — M25511 Pain in right shoulder: Secondary | ICD-10-CM | POA: Insufficient documentation

## 2024-12-29 DIAGNOSIS — M12811 Other specific arthropathies, not elsewhere classified, right shoulder: Secondary | ICD-10-CM | POA: Insufficient documentation

## 2024-12-29 DIAGNOSIS — M25512 Pain in left shoulder: Secondary | ICD-10-CM | POA: Insufficient documentation

## 2024-12-29 DIAGNOSIS — M797 Fibromyalgia: Secondary | ICD-10-CM | POA: Insufficient documentation

## 2024-12-29 DIAGNOSIS — M12812 Other specific arthropathies, not elsewhere classified, left shoulder: Secondary | ICD-10-CM | POA: Diagnosis present

## 2024-12-29 DIAGNOSIS — G8929 Other chronic pain: Secondary | ICD-10-CM | POA: Insufficient documentation

## 2024-12-29 DIAGNOSIS — G894 Chronic pain syndrome: Secondary | ICD-10-CM | POA: Diagnosis present

## 2024-12-29 DIAGNOSIS — Z79899 Other long term (current) drug therapy: Secondary | ICD-10-CM | POA: Insufficient documentation

## 2024-12-29 DIAGNOSIS — M5416 Radiculopathy, lumbar region: Secondary | ICD-10-CM | POA: Diagnosis present

## 2024-12-29 DIAGNOSIS — M47816 Spondylosis without myelopathy or radiculopathy, lumbar region: Secondary | ICD-10-CM | POA: Insufficient documentation

## 2024-12-29 DIAGNOSIS — M1711 Unilateral primary osteoarthritis, right knee: Secondary | ICD-10-CM | POA: Insufficient documentation

## 2024-12-29 MED ORDER — BUPRENORPHINE 7.5 MCG/HR TD PTWK: 1.0000 | MEDICATED_PATCH | TRANSDERMAL | 2 refills | Status: AC

## 2024-12-29 NOTE — Progress Notes (Signed)
 Nursing Pain Medication Assessment:  Safety precautions to be maintained throughout the outpatient stay will include: orient to surroundings, keep bed in low position, maintain call bell within reach at all times, provide assistance with transfer out of bed and ambulation.  Medication Inspection Compliance: Pill count conducted under aseptic conditions, in front of the patient. Neither the pills nor the bottle was removed from the patient's sight at any time. Once count was completed pills were immediately returned to the patient in their original bottle.  Medication: Tramadol  (Ultram ) Pill/Patch Count: 52 of 30 pills/patches remain Pill/Patch Appearance: Markings consistent with prescribed medication Bottle Appearance: Standard pharmacy container. Clearly labeled. Filled Date: 67 / 27 / 2025 Last Medication intake:  been a while

## 2025-01-03 ENCOUNTER — Ambulatory Visit: Admitting: Professional Counselor

## 2025-01-06 ENCOUNTER — Ambulatory Visit: Admitting: Professional Counselor

## 2025-01-06 DIAGNOSIS — F331 Major depressive disorder, recurrent, moderate: Secondary | ICD-10-CM

## 2025-01-06 DIAGNOSIS — F411 Generalized anxiety disorder: Secondary | ICD-10-CM

## 2025-01-06 NOTE — Progress Notes (Unsigned)
 " THERAPIST PROGRESS NOTE  Virtual Visit via Video Note  I connected with Lindsay Mcdonald on 01/06/25 at  8:00 AM EST by a video enabled telemedicine application and verified that I am speaking with the correct person using two identifiers.  Location: Patient: Home Provider: Remote office   I discussed the limitations of evaluation and management by telemedicine and the availability of in person appointments. The patient expressed understanding and agreed to proceed.  I discussed the assessment and treatment plan with the patient. The patient was provided an opportunity to ask questions and all were answered. The patient agreed with the plan and demonstrated an understanding of the instructions.   The patient was advised to call back or seek an in-person evaluation if the symptoms worsen or if the condition fails to improve as anticipated.  I provided 54 minutes of non-face-to-face time during this encounter. Lindsay Mcdonald, [facility]  Session Time: 8:00 AM - 8:54 AM  Participation Level: Active  Behavioral Response: Casual, Alert, Anxious  Type of Therapy: Individual Therapy  Treatment Goals addressed: Active OP Depression  LTG: I'd like to be able to make decisions and have clarity and feel good about my decisions. I'd like to also feel better with relationships. How to manage stress.  (Progressing)                Start:  05/25/24    Expected End:  05/24/25    Goal Note Reviewed 09/06/24 Still need, not where I want to be, just trying to put it all together.   STG: Stress management. To improve stress management AEB reduction in mood symptoms by practicing coping skills over the next 12 weeks (Progressing)  Goal Note Reviewed 09/06/24 I think a little better. I don't know if it's due to the medication and overeating, but that's not coping, but still working on it. Some things you mentioned today I can try to implement.   STG: Self-care, self-love, I'm kind of hard on  myself. To improve sense of self AEB self-report in increase of self-care activities and restructuring maladaptive patterns of thinking over the next 90 days.  (Progressing)  Goal Note Reviewed 09/06/24 Like we discussed today, the acceptance part is the key, if I can get there.   STG: I just want to be able to control that more. (Bringing up graphic images like traumatic things seen on TV). To reduce distress brought on by OCD tendencies AEB self-report reduction in obsessive and compulsive behaviors with exposure and CBT.  (Progressing)  Goal Note Reviewed 09/06/24 A little bit better. I think when you're busy that helps distract some, but when you're by yourself, that still needs assistance.  ProgressTowards Goals: Progressing  Interventions: CBT and Supportive  Summary: Lindsay Mcdonald is a 64 y.o. female who presents with a history of anxiety and depression. She appeared somber but oriented x5. She stated things have been okay. Tenille inquired about various topics of discussion about things she is struggling with. Explored thoughts/feelings around these situations. Sariah was receptive to how her thinking drives her feelings and behaviors. She was in agreement to work on changing those thoughts.  Therapist Response: Conducted session with Lindsay. Began session with check-in/update since previous session. Utilized empathetic and reflective listening. Used open-ended questions to facilitate discussion and summarized Yoshiye's thoughts/feelings. Provided feedback and other perspectives on various situations. Discussed tips to help improve negative emotions. Identified ways to change thinking to support reduced emotions and changed behavior. Reinforced change talk. Scheduled additional appointment  and concluded session.   Suicidal/Homicidal: No  Plan: Return again in 2 weeks.  Diagnosis: Generalized anxiety disorder  Major depressive disorder, recurrent episode, moderate (HCC)  Collaboration of  Care: Medication Management AEB chart review  Patient/Guardian was advised Release of Information must be obtained prior to any record release in order to collaborate their care with an outside provider. Patient/Guardian was advised if they have not already done so to contact the registration department to sign all necessary forms in order for us  to release information regarding their care.   Consent: Patient/Guardian gives verbal consent for treatment and assignment of benefits for services provided during this visit. Patient/Guardian expressed understanding and agreed to proceed.   Lindsay Mcdonald, [facility] 01/06/2025  "

## 2025-01-08 ENCOUNTER — Other Ambulatory Visit: Payer: Self-pay | Admitting: Family Medicine

## 2025-01-08 DIAGNOSIS — E119 Type 2 diabetes mellitus without complications: Secondary | ICD-10-CM

## 2025-01-08 DIAGNOSIS — F411 Generalized anxiety disorder: Secondary | ICD-10-CM

## 2025-01-08 DIAGNOSIS — E1169 Type 2 diabetes mellitus with other specified complication: Secondary | ICD-10-CM

## 2025-01-08 DIAGNOSIS — F331 Major depressive disorder, recurrent, moderate: Secondary | ICD-10-CM

## 2025-01-09 NOTE — Telephone Encounter (Signed)
 The original prescription was discontinued on 12/29/2024 by Patel, Seema K, NP for the following reason: Patient Preference.   Requested Prescriptions  Pending Prescriptions Disp Refills   Continuous Glucose Sensor (FREESTYLE LIBRE 3 PLUS SENSOR) MISC [Pharmacy Med Name: FREESTYLE LIBRE_3 PLUS SENSOR] 6 each 3    Sig: USE AS DIRECTED CHANGE 1 SENSOR  EVERY 15 DAYS     There is no refill protocol information for this order

## 2025-01-12 MED ORDER — CITALOPRAM HYDROBROMIDE 10 MG PO TABS: 10.0000 mg | ORAL_TABLET | Freq: Every day | ORAL | 0 refills | Status: AC

## 2025-01-13 ENCOUNTER — Encounter: Payer: Self-pay | Admitting: Family Medicine

## 2025-01-13 ENCOUNTER — Ambulatory Visit: Payer: Self-pay | Admitting: Family Medicine

## 2025-01-13 VITALS — BP 130/76 | HR 91 | Resp 16 | Ht 64.0 in | Wt 244.7 lb

## 2025-01-13 DIAGNOSIS — Z6841 Body Mass Index (BMI) 40.0 and over, adult: Secondary | ICD-10-CM | POA: Diagnosis not present

## 2025-01-13 DIAGNOSIS — J302 Other seasonal allergic rhinitis: Secondary | ICD-10-CM

## 2025-01-13 DIAGNOSIS — Z794 Long term (current) use of insulin: Secondary | ICD-10-CM

## 2025-01-13 DIAGNOSIS — I10 Essential (primary) hypertension: Secondary | ICD-10-CM | POA: Diagnosis not present

## 2025-01-13 DIAGNOSIS — M797 Fibromyalgia: Secondary | ICD-10-CM

## 2025-01-13 DIAGNOSIS — F331 Major depressive disorder, recurrent, moderate: Secondary | ICD-10-CM

## 2025-01-13 DIAGNOSIS — E1169 Type 2 diabetes mellitus with other specified complication: Secondary | ICD-10-CM | POA: Diagnosis not present

## 2025-01-13 DIAGNOSIS — F5105 Insomnia due to other mental disorder: Secondary | ICD-10-CM | POA: Diagnosis not present

## 2025-01-13 DIAGNOSIS — E1142 Type 2 diabetes mellitus with diabetic polyneuropathy: Secondary | ICD-10-CM | POA: Diagnosis not present

## 2025-01-13 DIAGNOSIS — G720 Drug-induced myopathy: Secondary | ICD-10-CM | POA: Diagnosis not present

## 2025-01-13 DIAGNOSIS — G4733 Obstructive sleep apnea (adult) (pediatric): Secondary | ICD-10-CM

## 2025-01-13 DIAGNOSIS — E119 Type 2 diabetes mellitus without complications: Secondary | ICD-10-CM

## 2025-01-13 LAB — POCT GLYCOSYLATED HEMOGLOBIN (HGB A1C): Hemoglobin A1C: 6.5 % — AB (ref 4.0–5.6)

## 2025-01-13 MED ORDER — OLMESARTAN MEDOXOMIL 40 MG PO TABS: 40.0000 mg | ORAL_TABLET | Freq: Every day | ORAL | 1 refills | Status: AC

## 2025-01-13 MED ORDER — MOUNJARO 15 MG/0.5ML ~~LOC~~ SOAJ: 15.0000 mg | SUBCUTANEOUS | 0 refills | Status: AC

## 2025-01-13 MED ORDER — METFORMIN HCL ER 750 MG PO TB24: 750.0000 mg | ORAL_TABLET | Freq: Every day | ORAL | 1 refills | Status: AC

## 2025-01-13 MED ORDER — TOUJEO SOLOSTAR 300 UNIT/ML ~~LOC~~ SOPN: 25.0000 [IU] | PEN_INJECTOR | Freq: Every day | SUBCUTANEOUS | 0 refills | Status: AC

## 2025-01-13 MED ORDER — MONTELUKAST SODIUM 10 MG PO TABS: 10.0000 mg | ORAL_TABLET | Freq: Every day | ORAL | 1 refills | Status: AC

## 2025-01-13 NOTE — Progress Notes (Signed)
 Name: Lindsay Mcdonald   MRN: [id number]    DOB: [birth year]   Date:01/13/2025       Progress Note  Subjective  Chief Complaint  Chief Complaint  Patient presents with   Medical Management of Chronic Issues   Discussed the use of AI scribe software for clinical note transcription with the patient, who gave verbal consent to proceed.  History of Present Illness Lindsay Mcdonald is a 64 year old female who presents for a three-month follow-up for major depression.  She is experiencing ongoing grief and emotional distress following the passing of her mother on December 11, 2024. She is still struggling with the loss and is currently in private counseling. She had been seeing Dr. Sharmon  in addition to regular counseling. She reports symptoms of major depression, including feeling down, depressed, or hopeless more than half the days over the past two weeks. She experiences a lack of interest in pleasure in doing things more than half the days, feels tired all the time, and has been overeating several days over the past two weeks. No thoughts of self-harm. She is currently taking citalopram  10 mg and mirtazapine  7.5 mg at night for depression and hydroxyzine  25 mg for anxiety.  She has type 2 diabetes associated with obesity and hypertension. She is taking metformin  1500 mg, Mounjaro  15 mg weekly, and Toujeo  insulin , which she recently increased from 20 to 25 units due to overeating. She has not been regularly checking her blood sugar levels. Her A1c is 6.5%.. She denies hypoglycemic episodes   She has diabetic peripheral neuropathy and is seeing a specialist for this condition. She uses Butrans  patches for pain management related to neuropathy and arthritis. She also takes baclofen .  She has a history of sleep apnea but is not currently using her CPAP machine. She reports no current issues with sleep apnea. She has lost weight, but her BMI remains over 40.  She has dyslipidemia and cannot tolerate  statins due to statin myopathy. She takes fish oil as a supplement for cholesterol management.  She reports a recurring blister on her foot, which she attributes to her shoes. She has been using Vaseline and socks at night to manage it.  She has a B12 deficiency, which was previously managed with supplements, but her levels are currently normal. Her vitamin D  levels were low, and she is taking a prescription supplement.    Patient Active Problem List   Diagnosis Date Noted   OSA (obstructive sleep apnea) 01/13/2025   Statin myopathy 01/13/2025   Bilateral shoulder pain 12/29/2024   Medication management 11/29/2024   Obesity, diabetes, and hypertension syndrome (HCC) 10/13/2024   Lumbar spondylosis 07/26/2024   Chronic radicular lumbar pain 07/26/2024   Rotator cuff arthropathy of both shoulders (left>right) 07/26/2024   Chronic pain syndrome 07/26/2024   Caregiver burden 06/10/2024   Major depressive disorder, recurrent episode, moderate (HCC) 04/13/2024   Current mild episode of major depressive disorder without prior episode 02/08/2024   Anxiety disorder 02/08/2024   Herniated intervertebral disc of lumbar spine 08/07/2023   B12 deficiency 05/15/2023   Occult blood positive stool 02/03/2023   Adenomatous polyp of colon 02/03/2023   Dyslipidemia associated with type 2 diabetes mellitus (HCC) 05/21/2021   History of marijuana use 03/30/2019   Fibromyalgia 12/31/2018   Venous vascular malformations 03/27/2016   Diabetic peripheral neuropathy associated with type 2 diabetes mellitus (HCC) 09/26/2015   Seasonal allergic rhinitis 09/26/2015   Muscle spasms of neck 06/19/2015   Insomnia due  to mental disorder 06/19/2015   Primary osteoarthritis of right knee 06/17/2015   Depression, major, recurrent, mild (HCC) 06/17/2015   Hyperlipidemia 06/17/2015   Benign hypertension 06/17/2015   Morbid obesity with BMI of 40.0-44.9, adult (HCC) 06/17/2015   Vitamin D  deficiency 06/17/2015     Past Surgical History:  Procedure Laterality Date   BREAST REDUCTION SURGERY     COLONOSCOPY WITH PROPOFOL  N/A 02/03/2023   Procedure: COLONOSCOPY WITH PROPOFOL ;  Surgeon: Therisa Bi, MD;  Location: [facility] END[facility]COPY;  Service: Gastroenterology;  Laterality: N/A;   ETHMOIDECTOMY Right 05/16/2020   Procedure: ETHMOIDECTOMY;  Surgeon: Edda Mt, MD;  Location: [facility] ORS;  Service: ENT;  Laterality: Right;   EXCISION ORAL TUMOR N/A 05/16/2020   Procedure: EXCISION OF SOFT PALATE PAPILLOMA;  Surgeon: Edda Mt, MD;  Location: [facility] OR[facility];  Service: ENT;  Laterality: N/A;   FRONTAL SINUS EXPLORATION Right 05/16/2020   Procedure: FRONTAL SINUS EXPLORATION;  Surgeon: Edda Mt, MD;  Location: [facility];  Service: ENT;  Laterality: Right;   IMAGE GUIDED SINUS SURGERY N/A 05/16/2020   Procedure: IMAGE GUIDED SINUS SURGERY;  Surgeon: Edda Mt, MD;  Location: [facility];  Service: ENT;  Laterality: N/A;   MAXILLARY ANTROSTOMY Bilateral 05/16/2020   Procedure: MAXILLARY ANTROSTOMY with tissue removal;  Surgeon: Edda Mt, MD;  Location: [facility] OR[facility];  Service: ENT;  Laterality: Bilateral;   nasal endoscopic     OOPHORECTOMY  ?   one ovary removed    REDUCTION MAMMAPLASTY Bilateral 2000   removal of ovary     SPHENOIDECTOMY Right 05/16/2020   Procedure: SPHENOIDECTOMY;  Surgeon: Edda Mt, MD;  Location: [facility] OR[facility];  Service: ENT;  Laterality: Right;    Family History  Problem Relation Age of Onset   Dementia Mother    Diabetes Mother    Cancer Mother 36       Breast   Breast cancer Mother 68   Gout Mother    Diabetes Father    Breast cancer Maternal Aunt 29   Kidney cancer Maternal Aunt    Kidney disease Maternal Aunt    Stroke Maternal Uncle    Alcohol abuse Daughter    Diabetes Daughter        Oldest Daughter   Alcohol abuse Daughter    Cancer Half-Sister 2       breast cancer   Prostate cancer Neg Hx    Bladder Cancer Neg Hx     Social History   Tobacco Use    Smoking status: Never   Smokeless tobacco: Never  Substance Use Topics   Alcohol use: No    Alcohol/week: 0.0 standard drinks of alcohol    Current Medications[1]  Allergies[2]  I personally reviewed active problem list, medication list, allergies, family history with the patient/caregiver today.   ROS  Ten systems reviewed and is negative except as mentioned in HPI    Objective Physical Exam  CONSTITUTIONAL: Patient appears well-developed and well-nourished. No distress. HEENT: Head atraumatic, normocephalic, neck supple. CARDIOVASCULAR: Normal rate, regular rhythm and normal heart sounds. No murmur heard. No BLE edema. PULMONARY: Effort normal and breath sounds normal. No respiratory distress. ABDOMINAL: There is no tenderness or distention. MUSCULOSKELETAL: Normal gait. Without gross motor or sensory deficit. PSYCHIATRIC: Patient has a normal mood and affect. Behavior is normal. Judgment and thought content normal. SKIN: callus formation on bottom on 1 st toe of right foot   Vitals:   01/13/25 1539  BP: 130/76  Pulse: 91  Resp: 16  SpO2: 95%  Weight: 244 lb 11.2 oz (111 kg)  Height: 5' 4 (1.626 m)    Body mass index is 42 kg/m.  Recent Results (from the past 2160 hours)  OPHTHALMOLOGY REPORT-SCANNED     Status: None   Collection Time: 10/20/24  9:52 AM  Result Value Ref Range   HM Diabetic Eye Exam No Retinopathy No Retinopathy    Comment: abst by him   A Comment    Compliance Drug Analysis, Ur     Status: None   Collection Time: 10/20/24  2:59 PM  Result Value Ref Range   Summary FINAL     Comment: ==================================================================== Compliance Drug Analysis, Ur ==================================================================== Test                             Result       Flag       Units  Drug Present and Declared for Prescription Verification   Baclofen                        PRESENT      EXPECTED   Mirtazapine                      PRESENT      EXPECTED   Vilazodone                      PRESENT      EXPECTED   Naproxen                        PRESENT      EXPECTED   Hydroxyzine                     PRESENT      EXPECTED  Drug Absent but Declared for Prescription Verification   Tramadol                        Not Detected UNEXPECTED ng/mg creat   Salicylate                     Not Detected UNEXPECTED    Aspirin, as indicated in the declared medication list, is not always    detected even when used as directed.    Ibuprofen                      Not Detected UNEXPECTED    Ibuprofen, as indicated in the declared medication list, is not     always detected even when used as directed.  ==================================================================== Test                      Result    Flag   Units      Ref Range   Creatinine              169              mg/dL      >=79 ==================================================================== Declared Medications:  The flagging and interpretation on this report are based on the  following declared medications.  Unexpected results may arise from  inaccuracies in the declared medications.   **Note: The testing scope of this panel includes these medications:   Baclofen  (Lioresal )  Hydroxyzine  (Vistaril )  Mirtazapine  (Remeron )  Naproxen  (Aleve )  Tramadol  (Ultram )  Vilazodone    **Note: The testing  scope of this panel does not include small to  moderate amounts of these reported medications:   Aspirin  Ibuprofen (Advil)   **Note: The testing scope of this panel does not include the  following reported medications:   Albuterol  (Airsupra )  Budeson ide (Airsupra )  Estradiol  (Combipatch)  Fluticasone  (Flonase )  Glucagon  (Gvoke)  Icosapent  (Vascepa )  Insulin  (Toujeo )  Meclizine (Antivert)  Melatonin  Metformin   Montelukast  (Singulair )  Norethindrone (Combipatch)  Olmesartan  (Benicar )  Tirzepatide   (Mounjaro ) ==================================================================== For clinical consultation, please call [phone]. ====================================================================   Comprehensive metabolic panel     Status: Abnormal   Collection Time: 11/30/24  5:37 PM  Result Value Ref Range   Sodium 139 135 - 145 mmol/L   Potassium 3.8 3.5 - 5.1 mmol/L   Chloride 104 98 - 111 mmol/L   CO2 25 22 - 32 mmol/L   Glucose, Bld 120 (H) 70 - 99 mg/dL    Comment: Glucose reference range applies only to samples taken after fasting for at least 8 hours.   BUN 14 8 - 23 mg/dL   Creatinine, Ser 9.22 0.44 - 1.00 mg/dL   Calcium  8.8 (L) 8.9 - 10.3 mg/dL   Total Protein 6.7 6.5 - 8.1 g/dL   Albumin 3.3 (L) 3.5 - 5.0 g/dL   AST 22 15 - 41 U/L   ALT 16 0 - 44 U/L   Alkaline Phosphatase 68 38 - 126 U/L   Total Bilirubin 0.8 0.0 - 1.2 mg/dL   GFR, Estimated >39 >39 mL/min    Comment: (NOTE) Calculated using the CKD-EPI Creatinine Equation (2021)    Anion gap 10 5 - 15    Comment: Performed at [facility], [address], [place], [place] [address]  CBC with Differential     Status: None   Collection Time: 11/30/24  5:37 PM  Result Value Ref Range   WBC 7.8 4.0 - 10.5 K/uL   RBC 4.85 3.87 - 5.11 MIL/uL   Hemoglobin 12.8 12.0 - 15.0 g/dL   HCT 59.3 63.9 - 53.9 %   MCV 83.7 80.0 - 100.0 fL   MCH 26.4 26.0 - 34.0 pg   MCHC 31.5 30.0 - 36.0 g/dL   RDW 86.0 88.4 - 84.4 %   Platelets 280 150 - 400 K/uL   nRBC 0.0 0.0 - 0.2 %   Neutrophils Relative % 56 %   Neutro Abs 4.3 1.7 - 7.7 K/uL   Lymphocytes Relative 32 %   Lymphs Abs 2.5 0.7 - 4.0 K/uL   Monocytes Relative 6 %   Monocytes Absolute 0.5 0.1 - 1.0 K/uL   Eosinophils Relative 5 %   Eosinophils Absolute 0.4 0.0 - 0.5 K/uL   Basophils Relative 1 %   Basophils Absolute 0.1 0.0 - 0.1 K/uL   Immature Granulocytes 0 %   Abs Immature Granulocytes 0.02 0.00 - 0.07 K/uL    Comment: Performed at [facility], [address] [place], [place] [address]  Urinalysis, Routine w reflex microscopic -Urine, Clean Catch     Status: Abnormal   Collection Time: 11/30/24  5:47 PM  Result Value Ref Range   Color, Urine YELLOW YELLOW   APPearance CLEAR CLEAR   Specific Gravity, Urine >1.030 (H) 1.005 - 1.030   pH 6.0 5.0 - 8.0   Glucose, UA NEGATIVE NEGATIVE mg/dL   Hgb urine dipstick NEGATIVE NEGATIVE   Bilirubin Urine NEGATIVE NEGATIVE   Ketones, ur NEGATIVE NEGATIVE mg/dL   Protein, ur NEGATIVE  NEGATIVE mg/dL   Nitrite NEGATIVE NEGATIVE   Leukocytes,Ua NEGATIVE NEGATIVE    Comment: Microscopic not done on urines with negative protein, blood, leukocytes, nitrite, or glucose < 500 mg/dL. Performed at [facility], [address] [place], [place] [address]   POCT glycosylated hemoglobin (Hb A1C)     Status: Abnormal   Collection Time: 01/13/25  3:47 PM  Result Value Ref Range   Hemoglobin A1C 6.5 (A) 4.0 - 5.6 %   HbA1c POC (<> result, manual entry)     HbA1c, POC (prediabetic range)     HbA1c, POC (controlled diabetic range)      PHQ2/9:    01/13/2025    3:35 PM 12/29/2024    9:55 AM 11/29/2024   11:55 AM 11/09/2024    2:12 PM 11/01/2024    9:09 AM  Depression screen PHQ 2/9  Decreased Interest 2 0 0 1 1  Down, Depressed, Hopeless 3 0 0 0 0  PHQ - 2 Score 5 0 0 1 1  Altered sleeping 3      Tired, decreased energy 3      Change in appetite 1      Feeling bad or failure about yourself  1      Trouble concentrating 0      Moving slowly or fidgety/restless 0      Suicidal thoughts 0      PHQ-9 Score 13      Difficult doing work/chores Somewhat difficult        phq 9 is positive  Fall Risk:    01/13/2025    3:35 PM 12/29/2024    9:54 AM 11/29/2024   11:55 AM 11/09/2024    2:12 PM 11/01/2024    9:09 AM  Fall Risk   Falls in the past year? 0 0 0 0 0  Number falls in past yr: 0  0 0 0  Injury with Fall? 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 On citalopram  and mirtazapine , engaged in counseling and psychiatric care. - Continue citalopram  10 mg daily. - Continue mirtazapine  7.5 mg at night. - Continue counseling and psychiatric follow-up.  Type 2 diabetes mellitus complicated by obesity, hypertension, dyslipidemia, and diabetic peripheral neuropathy Type 2 diabetes with obesity, hypertension, dyslipidemia, and neuropathy. A1c at 6.5%. Insulin  dosage recently increased due to overeating. Emphasized morning glucose monitoring to prevent hypoglycemia. - Continue metformin  1500 mg daily. - Continue Mounjaro  15 mg weekly. - Adjust Toujeo  dosage based on morning glucose levels: decrease by 2 units if glucose is consistently below 100 mg/dL, increase by 2 units if glucose is consistently above 140 mg/dL. - Check morning glucose levels daily. - Continue follow-up with neurology for diabetic peripheral neuropathy.  Statin myopathy Intolerance to statins due to myopathy. Currently not on statin therapy. - Continue fish oil supplementation. - Avoid statin therapy.  Obstructive sleep apnea Not using CPAP  Insomnia due to mental disorder Insomnia managed with mirtazapine , improving sleep quality. - Continue mirtazapine  7.5 mg at night.  Fibromyalgia - Continue Butrans  patch for pain management. - Continue baclofen  as prescribed.  Seasonal allergic rhinitis - Continue montelukast  as needed.  Vitamin D  deficiency Managed with prescription vitamin  D supplementation. - Continue prescription vitamin D  supplementation.  Diabetic foot corn/callus with recurrent blister Recurrent blister likely due to corn and shoe friction. - Apply Vaseline and wear socks at night. - Consider changing shoes or using shoe inserts for  better support. - refer to podiatrist if no resolution         [1]  Current Outpatient Medications:    Albuterol -Budesonide (AIRSUPRA ) 90-80 MCG/ACT AERO, Inhale 2 puffs into the lungs in the morning, at noon, in the evening, and at bedtime., Disp: 10.7 g, Rfl: 0   aspirin EC 81 MG tablet, Take 81 mg by mouth daily. Swallow whole., Disp: , Rfl:    baclofen  (LIORESAL ) 10 MG tablet, Take 2 tablets (20 mg total) by mouth at bedtime., Disp: , Rfl:    buprenorphine  (BUTRANS ) 7.5 MCG/HR, Place 1 patch onto the skin once a week., Disp: 4 patch, Rfl: 2   citalopram  (CELEXA ) 10 MG tablet, Take 1 tablet (10 mg total) by mouth daily with breakfast., Disp: 90 tablet, Rfl: 0   estradiol -norethindrone (COMBIPATCH) 0.05-0.14 MG/DAY, Place 1 patch onto the skin., Disp: , Rfl:    fluticasone  (FLONASE ) 50 MCG/ACT nasal spray, Place 2 sprays into both nostrils daily as needed for allergies or rhinitis., Disp: , Rfl:    hydrOXYzine  (ATARAX ) 25 MG tablet, Take 25 mg by mouth., Disp: , Rfl:    hydrOXYzine  (VISTARIL ) 25 MG capsule, TAKE 1 TO 2 CAPSULES BY MOUTH  DAILY AS NEEDED FOR ANXIETY, Disp: 60 capsule, Rfl: 0   Insulin  Pen Needle 32G X 6 MM MISC, 1 each by Does not apply route daily at 2 PM., Disp: 100 each, Rfl: 1   meclizine (ANTIVERT) 25 MG tablet, , Disp: , Rfl:    metFORMIN  (GLUCOPHAGE -XR) 750 MG 24 hr tablet, Take 2 tablets (1,500 mg total) by mouth daily with breakfast., Disp: 180 tablet, Rfl: 0   mirtazapine  (REMERON ) 7.5 MG tablet, TAKE 1 TABLET BY MOUTH AT  BEDTIME AS NEEDED. FOR SLEEP, Disp: 30 tablet, Rfl: 2   montelukast  (SINGULAIR ) 10 MG tablet, Take 1 tablet (10 mg total) by mouth at bedtime., Disp: 90 tablet, Rfl: 1   MOUNJARO  15 MG/0.5ML Pen, INJECT THE CONTENTS OF ONE PEN  SUBCUTANEOUSLY WEEKLY AS  DIRECTED, Disp: 6 mL, Rfl: 0   olmesartan  (BENICAR ) 40 MG tablet, Take 1 tablet (40 mg total) by mouth daily., Disp: 90 tablet, Rfl: 1   Omega-3 1000 MG CAPS, Take by mouth., Disp: , Rfl:     TOUJEO  SOLOSTAR 300 UNIT/ML Solostar Pen, INJECT 40 TO 50 UNITS INTO THE  SKIN EVERY MORNING, Disp: 4.5 mL, Rfl: 0 [2]  Allergies Allergen Reactions   Simvastatin     Muscle cramps    Trazodone  Hcl     It may have caused sob    Zetia  [Ezetimibe ] Other (See Comments)    Myalgia    Atorvastatin Other (See Comments)    muscle cramps   Compazine  Other (See Comments)    Twitching and spasms of neck muscles   Penicillins Rash    Reaction under 25, patient unsure that she still has an allergy

## 2025-01-15 ENCOUNTER — Other Ambulatory Visit: Payer: Self-pay | Admitting: Family Medicine

## 2025-01-15 MED ORDER — ICOSAPENT ETHYL 1 G PO CAPS: 2.0000 g | ORAL_CAPSULE | Freq: Two times a day (BID) | ORAL | 1 refills | Status: AC

## 2025-01-17 ENCOUNTER — Encounter: Payer: Self-pay | Admitting: Student in an Organized Health Care Education/Training Program

## 2025-01-17 DIAGNOSIS — F331 Major depressive disorder, recurrent, moderate: Secondary | ICD-10-CM

## 2025-01-17 NOTE — Telephone Encounter (Signed)
 Lvm to call office back

## 2025-01-18 ENCOUNTER — Other Ambulatory Visit: Payer: Self-pay | Admitting: Psychiatry

## 2025-01-18 ENCOUNTER — Ambulatory Visit: Admitting: Family Medicine

## 2025-01-18 ENCOUNTER — Ambulatory Visit: Admitting: Student in an Organized Health Care Education/Training Program

## 2025-01-18 DIAGNOSIS — F331 Major depressive disorder, recurrent, moderate: Secondary | ICD-10-CM

## 2025-01-18 MED ORDER — MIRTAZAPINE 7.5 MG PO TABS: 7.5000 mg | ORAL_TABLET | Freq: Every evening | ORAL | 1 refills | Status: DC | PRN

## 2025-01-20 ENCOUNTER — Ambulatory Visit: Admitting: Professional Counselor

## 2025-01-20 ENCOUNTER — Ambulatory Visit
Admission: RE | Admit: 2025-01-20 | Discharge: 2025-01-20 | Disposition: A | Payer: Self-pay | Source: Ambulatory Visit | Attending: Family Medicine | Admitting: Family Medicine

## 2025-01-20 DIAGNOSIS — Z0000 Encounter for general adult medical examination without abnormal findings: Secondary | ICD-10-CM

## 2025-01-20 DIAGNOSIS — Z1231 Encounter for screening mammogram for malignant neoplasm of breast: Secondary | ICD-10-CM | POA: Insufficient documentation

## 2025-01-23 ENCOUNTER — Encounter: Payer: Self-pay | Admitting: Family Medicine

## 2025-01-27 ENCOUNTER — Encounter

## 2025-01-31 ENCOUNTER — Ambulatory Visit: Admitting: Psychiatry

## 2025-02-01 ENCOUNTER — Ambulatory Visit: Admitting: Student in an Organized Health Care Education/Training Program

## 2025-02-03 ENCOUNTER — Ambulatory Visit: Admitting: Professional Counselor

## 2025-03-23 ENCOUNTER — Encounter: Admitting: Nurse Practitioner
